# Patient Record
Sex: Female | Born: 1945
Health system: Southern US, Community
[De-identification: ages and names within clinical notes are randomized; demographics above are authoritative.]

## PROBLEM LIST (undated history)

## (undated) DIAGNOSIS — G4700 Insomnia, unspecified: Secondary | ICD-10-CM

## (undated) DIAGNOSIS — M549 Dorsalgia, unspecified: Secondary | ICD-10-CM

## (undated) DIAGNOSIS — N3941 Urge incontinence: Secondary | ICD-10-CM

## (undated) DIAGNOSIS — M2540 Effusion, unspecified joint: Secondary | ICD-10-CM

## (undated) DIAGNOSIS — R197 Diarrhea, unspecified: Secondary | ICD-10-CM

## (undated) DIAGNOSIS — IMO0002 Reserved for concepts with insufficient information to code with codable children: Secondary | ICD-10-CM

## (undated) DIAGNOSIS — N63 Unspecified lump in unspecified breast: Secondary | ICD-10-CM

## (undated) DIAGNOSIS — T4145XA Adverse effect of unspecified anesthetic, initial encounter: Secondary | ICD-10-CM

## (undated) DIAGNOSIS — J329 Chronic sinusitis, unspecified: Secondary | ICD-10-CM

## (undated) DIAGNOSIS — R0600 Dyspnea, unspecified: Secondary | ICD-10-CM

## (undated) DIAGNOSIS — R5383 Other fatigue: Secondary | ICD-10-CM

## (undated) DIAGNOSIS — E669 Obesity, unspecified: Secondary | ICD-10-CM

## (undated) DIAGNOSIS — J189 Pneumonia, unspecified organism: Secondary | ICD-10-CM

## (undated) DIAGNOSIS — R011 Cardiac murmur, unspecified: Secondary | ICD-10-CM

## (undated) DIAGNOSIS — T8859XA Other complications of anesthesia, initial encounter: Secondary | ICD-10-CM

## (undated) DIAGNOSIS — R51 Headache: Secondary | ICD-10-CM

## (undated) DIAGNOSIS — F329 Major depressive disorder, single episode, unspecified: Secondary | ICD-10-CM

## (undated) DIAGNOSIS — F32A Depression, unspecified: Secondary | ICD-10-CM

## (undated) DIAGNOSIS — R55 Syncope and collapse: Secondary | ICD-10-CM

## (undated) DIAGNOSIS — I700 Atherosclerosis of aorta: Secondary | ICD-10-CM

## (undated) DIAGNOSIS — E739 Lactose intolerance, unspecified: Secondary | ICD-10-CM

## (undated) DIAGNOSIS — K5792 Diverticulitis of intestine, part unspecified, without perforation or abscess without bleeding: Secondary | ICD-10-CM

## (undated) DIAGNOSIS — K5790 Diverticulosis of intestine, part unspecified, without perforation or abscess without bleeding: Secondary | ICD-10-CM

## (undated) DIAGNOSIS — R531 Weakness: Secondary | ICD-10-CM

## (undated) DIAGNOSIS — I10 Essential (primary) hypertension: Secondary | ICD-10-CM

## (undated) DIAGNOSIS — K259 Gastric ulcer, unspecified as acute or chronic, without hemorrhage or perforation: Secondary | ICD-10-CM

## (undated) DIAGNOSIS — Z8709 Personal history of other diseases of the respiratory system: Secondary | ICD-10-CM

## (undated) DIAGNOSIS — F419 Anxiety disorder, unspecified: Secondary | ICD-10-CM

## (undated) DIAGNOSIS — D649 Anemia, unspecified: Secondary | ICD-10-CM

## (undated) DIAGNOSIS — E213 Hyperparathyroidism, unspecified: Secondary | ICD-10-CM

## (undated) DIAGNOSIS — K760 Fatty (change of) liver, not elsewhere classified: Secondary | ICD-10-CM

## (undated) DIAGNOSIS — R200 Anesthesia of skin: Secondary | ICD-10-CM

## (undated) DIAGNOSIS — M2550 Pain in unspecified joint: Secondary | ICD-10-CM

## (undated) DIAGNOSIS — L709 Acne, unspecified: Secondary | ICD-10-CM

## (undated) DIAGNOSIS — M1990 Unspecified osteoarthritis, unspecified site: Secondary | ICD-10-CM

## (undated) DIAGNOSIS — F4310 Post-traumatic stress disorder, unspecified: Secondary | ICD-10-CM

## (undated) DIAGNOSIS — Z87898 Personal history of other specified conditions: Secondary | ICD-10-CM

## (undated) DIAGNOSIS — G8929 Other chronic pain: Secondary | ICD-10-CM

## (undated) DIAGNOSIS — E785 Hyperlipidemia, unspecified: Secondary | ICD-10-CM

## (undated) DIAGNOSIS — Z8669 Personal history of other diseases of the nervous system and sense organs: Secondary | ICD-10-CM

## (undated) DIAGNOSIS — R112 Nausea with vomiting, unspecified: Secondary | ICD-10-CM

## (undated) DIAGNOSIS — E2839 Other primary ovarian failure: Secondary | ICD-10-CM

## (undated) HISTORY — PX: CATARACT EXTRACTION, BILATERAL: SHX1313

## (undated) HISTORY — DX: Syncope and collapse: R55

## (undated) HISTORY — DX: Depression, unspecified: F32.A

## (undated) HISTORY — PX: DILATION AND CURETTAGE OF UTERUS: SHX78

## (undated) HISTORY — DX: Dorsalgia, unspecified: M54.9

## (undated) HISTORY — DX: Reserved for concepts with insufficient information to code with codable children: IMO0002

## (undated) HISTORY — DX: Unspecified osteoarthritis, unspecified site: M19.90

## (undated) HISTORY — DX: Anxiety disorder, unspecified: F41.9

## (undated) HISTORY — PX: CERVICAL CONE BIOPSY: SUR198

## (undated) HISTORY — DX: Weakness: R53.1

## (undated) HISTORY — PX: MOUTH SURGERY: SHX715

## (undated) HISTORY — DX: Dyspnea, unspecified: R06.00

## (undated) HISTORY — DX: Headache: R51

## (undated) HISTORY — DX: Other primary ovarian failure: E28.39

## (undated) HISTORY — DX: Nausea with vomiting, unspecified: R11.2

## (undated) HISTORY — PX: NASAL SEPTUM SURGERY: SHX37

## (undated) HISTORY — DX: Other fatigue: R53.83

## (undated) HISTORY — PX: TUBAL LIGATION: SHX77

## (undated) HISTORY — DX: Major depressive disorder, single episode, unspecified: F32.9

## (undated) HISTORY — DX: Other chronic pain: G89.29

---
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
## 2004-02-09 ENCOUNTER — Ambulatory Visit: Payer: Self-pay | Admitting: Family Medicine

## 2004-10-12 ENCOUNTER — Ambulatory Visit: Payer: Self-pay | Admitting: Family Medicine

## 2005-04-06 ENCOUNTER — Ambulatory Visit: Payer: Self-pay | Admitting: Family Medicine

## 2005-04-24 ENCOUNTER — Other Ambulatory Visit: Admission: RE | Admit: 2005-04-24 | Discharge: 2005-04-24 | Payer: Self-pay | Admitting: Obstetrics and Gynecology

## 2005-07-18 ENCOUNTER — Ambulatory Visit: Admission: RE | Admit: 2005-07-18 | Discharge: 2005-07-18 | Payer: Self-pay | Admitting: Gynecologic Oncology

## 2005-10-03 ENCOUNTER — Ambulatory Visit (HOSPITAL_COMMUNITY): Admission: RE | Admit: 2005-10-03 | Discharge: 2005-10-03 | Payer: Self-pay | Admitting: Gynecologic Oncology

## 2005-11-08 ENCOUNTER — Ambulatory Visit: Payer: Self-pay | Admitting: Family Medicine

## 2006-05-22 ENCOUNTER — Ambulatory Visit: Payer: Self-pay | Admitting: Family Medicine

## 2006-11-06 ENCOUNTER — Telehealth: Payer: Self-pay | Admitting: Family Medicine

## 2006-12-11 DIAGNOSIS — R51 Headache: Secondary | ICD-10-CM

## 2006-12-11 DIAGNOSIS — R519 Headache, unspecified: Secondary | ICD-10-CM | POA: Insufficient documentation

## 2006-12-12 ENCOUNTER — Ambulatory Visit: Payer: Self-pay | Admitting: Family Medicine

## 2006-12-31 ENCOUNTER — Telehealth: Payer: Self-pay | Admitting: Family Medicine

## 2007-02-05 ENCOUNTER — Telehealth: Payer: Self-pay | Admitting: Family Medicine

## 2007-02-26 ENCOUNTER — Encounter: Payer: Self-pay | Admitting: Family Medicine

## 2007-07-02 ENCOUNTER — Ambulatory Visit: Payer: Self-pay | Admitting: Family Medicine

## 2007-07-02 DIAGNOSIS — M62838 Other muscle spasm: Secondary | ICD-10-CM

## 2007-07-02 DIAGNOSIS — M1990 Unspecified osteoarthritis, unspecified site: Secondary | ICD-10-CM

## 2007-08-13 ENCOUNTER — Telehealth: Payer: Self-pay | Admitting: Family Medicine

## 2008-01-08 ENCOUNTER — Ambulatory Visit: Payer: Self-pay | Admitting: Family Medicine

## 2008-01-08 DIAGNOSIS — M545 Low back pain, unspecified: Secondary | ICD-10-CM | POA: Insufficient documentation

## 2008-07-08 ENCOUNTER — Telehealth: Payer: Self-pay | Admitting: Family Medicine

## 2008-07-29 ENCOUNTER — Ambulatory Visit: Payer: Self-pay | Admitting: Family Medicine

## 2009-02-10 ENCOUNTER — Ambulatory Visit: Payer: Self-pay | Admitting: Family Medicine

## 2009-02-10 DIAGNOSIS — F341 Dysthymic disorder: Secondary | ICD-10-CM | POA: Insufficient documentation

## 2009-02-10 DIAGNOSIS — IMO0002 Reserved for concepts with insufficient information to code with codable children: Secondary | ICD-10-CM

## 2009-02-10 DIAGNOSIS — M1710 Unilateral primary osteoarthritis, unspecified knee: Secondary | ICD-10-CM | POA: Insufficient documentation

## 2009-02-10 HISTORY — DX: Unilateral primary osteoarthritis, unspecified knee: M17.10

## 2009-08-25 ENCOUNTER — Ambulatory Visit: Payer: Self-pay | Admitting: Family Medicine

## 2010-03-03 ENCOUNTER — Ambulatory Visit
Admission: RE | Admit: 2010-03-03 | Discharge: 2010-03-03 | Payer: Self-pay | Source: Home / Self Care | Attending: Family Medicine | Admitting: Family Medicine

## 2010-03-03 DIAGNOSIS — E669 Obesity, unspecified: Secondary | ICD-10-CM | POA: Insufficient documentation

## 2010-03-22 NOTE — Assessment & Plan Note (Signed)
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 Past history of degenerative disc disease the lumbar spine Request an injection of Depo-Medrol 120 mg which helped her for approximately 3 months Requests refill of her  Allergies (verified): No Known Drug Allergies  Past History:  Past Medical History: Last updated: 02/10/2009 panic attacks Headache chronic low back pain  Review of Systems      See HPI  The patient denies anorexia, fever, weight loss, weight gain, vision loss, decreased hearing, hoarseness, chest pain, syncope, dyspnea on exertion, peripheral edema, prolonged cough, headaches, hemoptysis, abdominal pain, melena, hematochezia, severe indigestion/heartburn, hematuria, incontinence, genital sores, muscle weakness, suspicious skin lesions, transient blindness, difficulty walking, depression, unusual weight change, abnormal bleeding, enlarged lymph nodes, angioedema, breast masses, and testicular masses.    Physical Exam  General:  Well-developed,well-nourished,in no acute distress; alert,appropriate and cooperative throughout examinationoverweight-appearing.   Neck:  No deformities, masses, or tenderness noted. Lungs:  Normal respiratory effort, chest expands symmetrically. Lungs are clear to auscultation, no crackles or wheezes. Heart:  Normal rate and regular rhythm. S1 and S2 normal without gallop,  murmur, click, rub or other extra sounds. Msk:  tenderness over her left knee medial and lateral aspect Tender overall lumbar spine as well as both sacroiliac joint   Impression & Recommendations:  Problem # 1:  ARTHRITIS, KNEE (ICD-716.96) Assessment Deteriorated  Orders: Depo- Medrol 80mg  (J1040) Depo- Medrol 40mg  (J1030) Admin of Therapeutic Inj  intramuscular or subcutaneous ([address])  Problem # 2:  ANXIETY DEPRESSION (ICD-300.4) Assessment: Unchanged  Problem # 3:  LOW BACK PAIN, CHRONIC (ICD-724.2) Assessment: Unchanged  The following medications were removed from the medication list:    Lorcet 10/650 10-650 Mg Tabs (Hydrocodone-acetaminophen) .Marland Kitchen... 1 q4h as needed pain not over 4 per day Her updated medication list for this problem includes:    Flexeril 10 Mg Tabs (Cyclobenzaprine hcl) .Marland Kitchen... 1 morn, midafternoon and hs for    Hydrocodone-acetaminophen 10-500 Mg Tabs (Hydrocodone-acetaminophen) .Marland Kitchen... 1 q4h as needed pain, not over 4 per day  Problem # 4:  HEADACHE (ICD-784.0) Assessment: Unchanged  The following medications were removed from the medication list:    Lorcet 10/650 10-650 Mg Tabs (Hydrocodone-acetaminophen) .Marland Kitchen... 1 q4h as needed pain not over 4 per day Her updated medication list for this problem includes:    Hydrocodone-acetaminophen 10-500 Mg Tabs (Hydrocodone-acetaminophen) .Marland Kitchen... 1 q4h as needed pain, not over 4 per day  Complete Medication List: 1)  Alprazolam 0.5 Mg Tabs (Alprazolam) .Marland Kitchen.. 1 three times a day for stress no early reflls 2)  Flexeril 10 Mg Tabs (Cyclobenzaprine hcl) .Marland Kitchen.. 1 morn, midafternoon and hs for 3)  Hydrocodone-acetaminophen 10-500 Mg Tabs (Hydrocodone-acetaminophen) .Marland Kitchen.. 1 q4h as needed pain, not over 4 per day  Patient Instructions: 1)  Will give Depo-Medrol 120  mg IMarthritis and chronic back pain 2)  We'll refill her hydrocodone and alprazolam 3)  Turn in 6 months for laboratory studies Prescriptions: ALPRAZOLAM 0.5 MG  TABS  (ALPRAZOLAM) 1 three times a day for stress NO EARLY REFLLS  #90 x 5   Entered and Authorized by:   Judithann Sheen MD   Signed by:   Judithann Sheen MD on 08/25/2009   Method used:   Print then Give to Patient   RxID:   [id number] HYDROCODONE-ACETAMINOPHEN 10-500 MG TABS (HYDROCODONE-ACETAMINOPHEN) 1 q4h as needed pain, not over 4 per day  #120 x 5   Entered and Authorized by:   Judithann Sheen MD   Signed by:   Judithann Sheen MD on 08/25/2009   Method used:   Print then Give to Patient   RxID:   [id number] FLEXERIL 10 MG TABS (CYCLOBENZAPRINE HCL) 1 morn, midafternoon and hs for  #90 x 11   Entered and Authorized by:   Judithann Sheen MD   Signed by:   Judithann Sheen MD on 08/25/2009   Method used:   Electronically to        [facility]. [phone]* (retail)       [phone] [address]       [place], [place]  [address]       Ph: [phone]       Fax: [phone]   RxID:   [id number]    Medication Administration  Injection # 1:    Medication: Depo- Medrol 80mg     Diagnosis: ARTHRITIS, KNEE [phone])    Route: IM    Site: RUOQ gluteus    Exp Date: 05/2012    Lot #: [id number]    Mfr: Pharmacia    Patient tolerated injection without complications    Given by: Pura Spice, RN (August 25, 2009 2:29 PM)  Injection # 2:    Medication: Depo- Medrol 40mg     Diagnosis: ARTHRITIS, KNEE (ICD-716.96)    Route: IM    Site: RUOQ gluteus    Exp Date: 05/2012    Lot #: [id number]    Mfr: Pharmacia    Patient tolerated injection without complications    Given by: Pura Spice, RN (August 25, 2009 2:30 PM)  Orders Added: 1)  Depo- Medrol 80mg  [J1040] 2)  Depo- Medrol 40mg  [J1030] 3)  Admin of Therapeutic Inj  intramuscular or subcutaneous [96372] 4)  Est. Patient Level III [42706]

## 2010-03-24 NOTE — Assessment & Plan Note (Signed)
Summary: MED CK / REVIEW // RS   Vital Signs:  Patient profile:   65 year old female Height:      67.5 inches Weight:      218 pounds BMI:     33.76 Temp:     98.4 degrees F oral Pulse rate:   99 / minute Pulse rhythm:   regular BP sitting:   150 / 84  (left arm) Cuff size:   regular  Vitals Entered By: Alfred Levins, CMA (March 03, 2010 1:31 PM)  HerCC: discuss meds   History of Present Illness: this 65 year old white single female who's had chronic low back pain as well as severe arthritis left knee, anxiety depression and episodic headaches these are all chronic problems and the patient is in the discussed these today as well as get refill of her medication she is out of work and unable to get a job at this time She has no other complaint except continues to be obese and having difficulty losing weight but is now normal a rigid called a noninflammatory.  Current Medications (verified): 1)  Alprazolam 0.5 Mg  Tabs (Alprazolam) .Marland Kitchen.. 1 Three Times A Day For Stress No Early Reflls 2)  Flexeril 10 Mg Tabs (Cyclobenzaprine Hcl) .Marland Kitchen.. 1 Morn, Midafternoon and Hs For 3)  Hydrocodone-Acetaminophen 10-500 Mg Tabs (Hydrocodone-Acetaminophen) .Marland Kitchen.. 1 Q4h As Needed Pain, Not Over 4 Per Day  Allergies (verified): No Known Drug Allergies  Past History:  Past Medical History: Last updated: 02/10/2009 panic attacks Headache chronic low back pain  Past Surgical History: Dand C age 64 tubal ligation conization of cervix  Review of Systems      See HPI  Physical Exam  General:  Well-developed,well-nourished,in no acute distress; alert,appropriate and cooperative throughout examinationoverweight-appearing.   Lungs:  Normal respiratory effort, chest expands symmetrically. Lungs are clear to auscultation, no crackles or wheezes. Heart:  Normal rate and regular rhythm. S1 and S2 normal without gallop, murmur, click, rub or other extra sounds. Abdomen:  Bowel sounds positive,abdomen  soft and non-tender without masses, organomegaly or hernias noted. Msk:  tenderness over the lumbosacral spine both SI joints bilaterally Swollen tender left knee   Impression & Recommendations:  Problem # 1:  LOW BACK PAIN, CHRONIC (ICD-724.2) Assessment Deteriorated  The following medications were removed from the medication list:    Hydrocodone-acetaminophen 10-500 Mg Tabs (Hydrocodone-acetaminophen) .Marland Kitchen... 1 q4h as needed pain, not over 4 per day Her updated medication list for this problem includes:    Flexeril 10 Mg Tabs (Cyclobenzaprine hcl) .Marland Kitchen... 1 morn, midafternoon and hs for muscle spasm    Hydrocodone-acetaminophen 10-660 Mg Tabs (Hydrocodone-acetaminophen) .Marland Kitchen... 1 q4h as needed pain  Orders: Admin of Therapeutic Inj  intramuscular or subcutaneous (16109) Depo- Medrol 80mg  (J1040) Admin of Therapeutic Inj  intramuscular or subcutaneous ([address])  Problem # 2:  EXOGENOUS OBESITY (ICD-278.00) Assessment: Unchanged  Problem # 3:  ARTHRITIS, KNEE (ICD-716.96) Assessment: Deteriorated recommend seeing the orthopedist  Problem # 4:  ANXIETY DEPRESSION (ICD-300.4) Assessment: Unchanged  Problem # 5:  ARTHRITIS (ICD-716.90) Assessment: Unchanged  Complete Medication List: 1)  Alprazolam 0.5 Mg Tabs (Alprazolam) .Marland Kitchen.. 1 three times a day for stress no early reflls 2)  Flexeril 10 Mg Tabs (Cyclobenzaprine hcl) .Marland Kitchen.. 1 morn, midafternoon and hs for muscle spasm 3)  Hydrocodone-acetaminophen 10-660 Mg Tabs (Hydrocodone-acetaminophen) .Marland Kitchen.. 1 q4h as needed pain  Patient Instructions: 1)  decrease salt in diet 2)  after getting the injection you will be better to exercise 3)  You  need to lose weight. Consider a lower calorie diet and regular exercise.  Prescriptions: FLEXERIL 10 MG TABS (CYCLOBENZAPRINE HCL) 1 morn, midafternoon and hs for muscle spasm  #90 x 11   Entered and Authorized by:   Judithann Sheen MD   Signed by:   Judithann Sheen MD on 03/03/2010   Method  used:   Electronically to        [facility] [address] [phone]* (retail)       [address]       [place], [place]  [address]       Ph: [phone] or [phone]       Fax: [phone]   RxID:   [id number] HYDROCODONE-ACETAMINOPHEN 10-660 MG TABS (HYDROCODONE-ACETAMINOPHEN) 1 q4h as needed pain  #100 x 5   Entered and Authorized by:   Judithann Sheen MD   Signed by:   Judithann Sheen MD on 03/03/2010   Method used:   Print then Give to Patient   RxID:   [id number] ALPRAZOLAM 0.5 MG  TABS (ALPRAZOLAM) 1 three times a day for stress NO EARLY REFLLS  #90 x 5   Entered and Authorized by:   Judithann Sheen MD   Signed by:   Judithann Sheen MD on 03/03/2010   Method used:   Print then Give to Patient   RxID:   [id number]    Medication Administration  Injection # 1:    Medication: Depo- Medrol 80mg     Diagnosis: LOW BACK PAIN, CHRONIC (ICD-724.2)    Route: IM    Site: RUOQ gluteus    Exp Date: 09/08/2012    Lot #: [id number]    Mfr: Pharmacia    Patient tolerated injection without complications    Given by: Judithann Sheen MD (March 09, 2010 12:44 PM)  Orders Added: 1)  Admin of Therapeutic Inj  intramuscular or subcutaneous [96372] 2)  Depo- Medrol 80mg  [J1040] 3)  Admin of Therapeutic Inj  intramuscular or subcutaneous [96372]  Appended Document: MED CK / REVIEW // RS

## 2010-07-08 NOTE — Consult Note (Signed)
NAME:  Alexis Cole, TOPOR NO.:  [id number]   MEDICAL RECORD NO.:  [id number]          PATIENT TYPE:  OUT   LOCATION:  GYN                          FACILITY:  [facility]   PHYSICIAN:  John T. Kyla Balzarine, M.D.    DATE OF BIRTH:  1945/04/17   DATE OF CONSULTATION:  DATE OF DISCHARGE:                                   CONSULTATION   CHIEF COMPLAINT:  Followup after laser vaporization of multiple vulvar  condylomas/carcinoma in situ.   This patient underwent examination under anesthesia with laser vaporization  of multiple genital condylomas and VIN III of the vulva, performed October 03, 2005.  She had an uncomplicated convalescence and rapidly healed.  She  notes no new condylomatous lesions.   PHYSICAL EXAMINATION:  Inspection of the vulva reveals no acetowhite  lesions.  There are patches of new skin which represent larger plaques of  disease that were treated with the laser.  Bimanual and rectovaginal  examinations are deferred.   ASSESSMENT:  Multifocal vulvar dysplasia/carcinoma in situ treated with  laser.   PLAN:  The patient reassured, and I recommended that she have examination  and cytology at 6 month intervals.  This could be performed by Dr. Renaldo Fiddler,  and we would be glad to see the patient back at any time on a p.r.n. basis.      John T. Kyla Balzarine, M.D.  Electronically Signed     JTS/MEDQ  D:  11/28/2005  T:  11/29/2005  Job:  [id number]   cc:   Telford Nab, R.N.  [address]  [place], [place] [address]   Zelphia Cairo, MD  Fax: [phone]

## 2010-07-08 NOTE — Consult Note (Signed)
NAME:  Alexis Cole, Alexis NO.:  [id number]   MEDICAL RECORD NO.:  [id number]          PATIENT TYPE:  OUT   LOCATION:  GYN                          FACILITY:  [facility]   PHYSICIAN:  John T. Kyla Balzarine, M.D.    DATE OF BIRTH:  05/11/1945   DATE OF CONSULTATION:  07/18/2005  DATE OF DISCHARGE:                                   CONSULTATION   CHIEF COMPLAINT:  This 65 year old woman is seen at the request of Dr.  Zelphia Cairo for management of high-grade vulvar dysplasia.   HISTORY OF PRESENT ILLNESS:  The patient notes that she has had vulvar  lesions for almost 20 years.  Apparently, at about that time, she was  treated for CIN with conization of the cervix.  She relates normal cytology  since and had a Pap smear on April 24, 2005 negative for intraepithelial  lesions.  She was treated for Trichomonas with Flagyl.  At the time she was  evaluated by Dr. Renaldo Fiddler, she was found to have two condylomatous appearing  lesions which were removed in the office revealing VIN II to III.  She is  aware of persistent condylomatous tissue across the perineum.   PAST MEDICAL HISTORY:  Significant for migraine headaches, undefined thyroid  problem in the past but on no replacement.  She has chronic back issues  related to an accident.  Status post tubal ligation, conization.   MEDICATIONS:  Xanax, Celebrex, cortisone injections every eight months to  one year.   ALLERGIES:  None known.   PERSONAL/SOCIAL HISTORY:  The patient is a Barista and admits  tobacco use; denies significant ethanol use.   FAMILY HISTORY:  Significant for lung cancer in her father and hypertension  in her mother but no known breast, gynecologic or colon malignancies.   REVIEW OF SYSTEMS:  Otherwise negative.   EXAM:  Weight 146 pounds, blood pressure 192/64, pulse 82.  The patient is  alert and oriented x3 in no acute distress.  There is no pathologic  lymphadenopathy on lymph node survey.  The  abdomen is soft and benign with  no tenderness or mass.  Extremities have full strength and range of motion  without edema.  External genitalia and BUS are normal to inspection and  palpation with the exception of a 3.1 cm patch condylomatous epithelium  across the left mid-perineum, not biopsied today.  Vagina is clear, without  lesions.  Cervix mobile without lesions.  Bimanual and rectovaginal  examinations is unremarkable.   ASSESSMENT:  VIN II to III in a patient with other sites of lower genital  tract dysplasia.   PLAN:  We discussed options for management including Aldara, excision and  laser vaporazation.  At the end of our discussion the patient opted for  laser vaporazation.  She has some dance events in June, therefore we will  schedule her at my next visit, July, 24th.      John T. Kyla Balzarine, M.D.  Electronically Signed     JTS/MEDQ  D:  07/18/2005  T:  07/19/2005  Job:  [id number]   cc:  Zelphia Cairo, MD  Fax: [phone]   Telford Nab, R.N.  [address]  [place], [place] [address]

## 2010-07-08 NOTE — Letter (Signed)
February 26, 2007    Trial Court Administrator  ATTN:  Lonn Georgia Request  [address]  [place], [place], [address]   RE:  JOIA, DOYLE  MRN:  [id number]  /  DOB:  1946/01/04   Dear Milford Cage:   This is Dr. Leroy Sea and I am the doctor for Fransico Him.  Address [address] [place], [place] [address].  Date  of birth 10/31/1945.  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  Please excuse her  from this duty.   Her problems relate to recurrent panic attacks.  She has a recent knee  injury and having considerable problems.  In addition to this, she has  difficulty sitting for any extended time due to a previous fracture to  her back.  She is under the care of a neurosurgeon.   If any other information is desired from me, please do not hesitate to  notify me.  Thank you very much for your consideration in regard to  excusing this person from jury duty.    Sincerely,      Ellin Saba., MD  Electronically Signed    WRS/MedQ  DD: 02/26/2007  DT: 02/26/2007  Job #: [id number]

## 2010-07-08 NOTE — Op Note (Signed)
NAME:  Alexis Cole, Alexis Cole NO.:  [id number]   MEDICAL RECORD NO.:  [id number]          PATIENT TYPE:  AMB   LOCATION:  DAY                          FACILITY:  [facility]   PHYSICIAN:  John T. Kyla Balzarine, M.D.    DATE OF BIRTH:  [birth year]   DATE OF PROCEDURE:  10/03/2005  DATE OF DISCHARGE:                                 OPERATIVE REPORT   SURGEON:  John T. Kyla Balzarine, M.D.   PREOPERATIVE DIAGNOSIS:  Multiple vulvar condylomas/carcinoma in situ.   POSTOPERATIVE DIAGNOSIS:  Multiple vulvar condylomas/carcinoma in situ.   PROCEDURE:  Extensive laser vaporization of vulvar lesions.   ANESTHESIA:  General LMA with local Marcaine.   DESCRIPTION OF FINDINGS AND INDICATIONS FOR SURGERY:  This 65 year old woman  presented with multiple condylomatous lesions involving the perineum,  anterior left labia majora and left posterior labia majora, clinically  compatible with condyloma, but biopsy revealing VIN2 to VIN3.  She consented  to laser vaporization as an alternative to other forms of therapy.   Examination under anesthesia revealed a 2 by 3 cm flap condylomatous lesion  in the central perineum, superficial lesion measuring 1 by 2 cm left  posterior labial majora, and a 0.7 by 0.7 cm condylomatous lesion in the  anterior left labia majora.  Inspection with acetic acid revealed no other  lesions.  All lesions were ablated to the second surgical plane using the  CO2 laser.   DESCRIPTION OF PROCEDURE:  The patient was prepped and draped in the  lithotomy position with wet drapes.  The vulva was stained with prolonged  contact with acetic acid and revealed the findings as described above.  The  base of each individual lesion was injected intradermally and subcutaneously  with 0.25% Marcaine, 10 mL total.  Using the CO2 laser with hand piece, the  individual lesions were ablated to the second surgical plane.  Adjacent  normal appearing skin was taken down to a similar depth at least 5  mm  circumference around each lesion.  Hemostasis was excellent and the patient  was returned to the recovery room in stable condition.  Estimated blood loss  nil.  Sponge and instrument counts were correct.  Pathology specimen none.      John T. Kyla Balzarine, M.D.  Electronically Signed     JTS/MEDQ  D:  10/03/2005  T:  10/03/2005  Job:  [id number]   cc:   Telford Nab, R.N.  [address]  [place], [place] [address]   Zelphia Cairo, MD  Fax: [phone]

## 2010-08-23 ENCOUNTER — Encounter: Payer: Self-pay | Admitting: Family Medicine

## 2010-08-23 ENCOUNTER — Ambulatory Visit (INDEPENDENT_AMBULATORY_CARE_PROVIDER_SITE_OTHER): Payer: Self-pay | Admitting: Family Medicine

## 2010-08-23 VITALS — BP 126/90 | Temp 98.4°F | Wt 216.0 lb

## 2010-08-23 DIAGNOSIS — M545 Low back pain: Secondary | ICD-10-CM

## 2010-08-23 DIAGNOSIS — M129 Arthropathy, unspecified: Secondary | ICD-10-CM

## 2010-08-23 DIAGNOSIS — M1990 Unspecified osteoarthritis, unspecified site: Secondary | ICD-10-CM

## 2010-08-23 MED ORDER — METHYLPREDNISOLONE ACETATE 80 MG/ML IJ SUSP
120.0000 mg | Freq: Once | INTRAMUSCULAR | Status: AC
  Administered 2010-08-23: 120 mg via INTRAMUSCULAR

## 2010-08-23 MED ORDER — CYCLOBENZAPRINE HCL 10 MG PO TABS: 10.0000 mg | ORAL_TABLET | Freq: Three times a day (TID) | ORAL | Status: DC | PRN

## 2010-08-23 MED ORDER — ALPRAZOLAM 0.5 MG PO TABS: ORAL_TABLET | ORAL | Status: DC

## 2010-08-23 MED ORDER — HYDROCODONE-ACETAMINOPHEN 10-500 MG PO TABS: ORAL_TABLET | ORAL | Status: DC

## 2010-09-26 ENCOUNTER — Encounter: Payer: Self-pay | Admitting: Family Medicine

## 2010-09-26 NOTE — Patient Instructions (Signed)
For your chronic pain anxiety and depression we will continue the same medication

## 2010-09-26 NOTE — Progress Notes (Signed)
  Subjective:    Patient ID: Alexis Cole, female    DOB: 21-Nov-1945, 65 y.o.   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 She cannot take NSAIDs even Celebrex caused nausea and vomiting so we must rely on pain medications for arthritis she relates she has been in pain for the past 6 months she continues not to have any and sure what her sugars normal has no other symptoms except those have muscle spasm over the lumbar spine HPI    Review of Systems CHPI     Objective:   Physical Exam patient is a well-built well nourished obese white female who is in no distress but is complaining of pain vital signs good except for weight Heart lungs no abnormalities noted Back lumbar muscle spasm tenderness over both SI joints limitation straight leg raising Extremities no edema , both knees mildly swollen with tenderness medially and laterally bilaterally        Assessment & Plan:  Rondec low back pain continue hydrocodone Also spasm Flexeril 10 mg 3 times a day Chronic anxiety and depression continue alprazolam  0.5 mg 3 times a day

## 2010-11-15 ENCOUNTER — Telehealth: Payer: Self-pay | Admitting: Family Medicine

## 2010-11-15 NOTE — Telephone Encounter (Signed)
Pt states she was approved for a shot every 3 months and she comes to see Dr. Scotty Court ever 6 months.

## 2010-11-15 NOTE — Telephone Encounter (Signed)
Pt  Is having issues with her back, would not give details. Pt is requesting a call back

## 2010-11-15 NOTE — Telephone Encounter (Signed)
Per pt request going to check on the amount of a depo medrol injection.  Pt is aware that she will receive a return call tomorrow 11/16/10.

## 2010-11-16 NOTE — Telephone Encounter (Signed)
Called and spoke with pt and she is aware that an estimate for the depo medrol 120 mg is about $92.25. Pt states she will bring $122 just in case.  Pt is aware that this is an estimate and the price may vary.

## 2010-11-23 ENCOUNTER — Ambulatory Visit: Payer: Self-pay | Admitting: Family Medicine

## 2010-11-23 ENCOUNTER — Ambulatory Visit (INDEPENDENT_AMBULATORY_CARE_PROVIDER_SITE_OTHER): Payer: Self-pay

## 2010-11-23 DIAGNOSIS — M1990 Unspecified osteoarthritis, unspecified site: Secondary | ICD-10-CM

## 2010-11-23 DIAGNOSIS — M129 Arthropathy, unspecified: Secondary | ICD-10-CM

## 2010-11-23 DIAGNOSIS — G8929 Other chronic pain: Secondary | ICD-10-CM

## 2010-11-23 DIAGNOSIS — M62838 Other muscle spasm: Secondary | ICD-10-CM

## 2010-11-23 DIAGNOSIS — M545 Low back pain: Secondary | ICD-10-CM

## 2010-11-23 MED ORDER — METHYLPREDNISOLONE ACETATE 80 MG/ML IJ SUSP
120.0000 mg | Freq: Once | INTRAMUSCULAR | Status: AC
  Administered 2010-11-23: 120 mg via INTRAMUSCULAR

## 2011-03-09 ENCOUNTER — Ambulatory Visit (INDEPENDENT_AMBULATORY_CARE_PROVIDER_SITE_OTHER): Payer: Self-pay | Admitting: Family Medicine

## 2011-03-09 VITALS — BP 155/95 | HR 80 | Temp 98.0°F | Ht 67.5 in | Wt 211.7 lb

## 2011-03-09 DIAGNOSIS — Z8739 Personal history of other diseases of the musculoskeletal system and connective tissue: Secondary | ICD-10-CM

## 2011-03-09 DIAGNOSIS — I10 Essential (primary) hypertension: Secondary | ICD-10-CM

## 2011-03-09 NOTE — Patient Instructions (Addendum)
Please make sure we have received your record from your prior doctor. Please have labs taken and after that make an appointment for a full physical.  Take BP regularly an keep a diary with values. Make an appointment once your prior medical records are available to [place].

## 2011-03-14 ENCOUNTER — Encounter: Payer: Self-pay | Admitting: Family Medicine

## 2011-03-14 DIAGNOSIS — I10 Essential (primary) hypertension: Secondary | ICD-10-CM | POA: Insufficient documentation

## 2011-03-14 DIAGNOSIS — Z8739 Personal history of other diseases of the musculoskeletal system and connective tissue: Secondary | ICD-10-CM | POA: Insufficient documentation

## 2011-03-14 NOTE — Assessment & Plan Note (Signed)
On visit 155/95. At the end of the visit. Manually 140/19mmHg. No hx of hypertension she knows of.  Plan: Release of information form to obtain prior Pt medical history. Recheck BP every other day and keep a BP diary. CBC, CMET, Lipid Profile, A1C

## 2011-03-14 NOTE — Progress Notes (Signed)
  Subjective:    Patient ID: Alexis Cole, female    DOB: [birth year], 66 y.o.   MRN: [id number]  HPI Pt that comes today to establish care in our [facility]. She was seen in a office who's doctor retire recently and for financial reasons was not be able to access care until now. Her main concern is her chronic pain management. She came with a med container of narcotics asking for refills, as well as Xanax that she says she takes for her panic attacks. She comes accompanied by a friend who presents herself as a Publishing rights manager and per pt's own statement:  "she is here to help, since I am afraid of doctors and all I need is an MD to refill my medications". She comments she has a long standing history of back pain but never had studies done and she is waiting to be covered with insurance in 6 months to be able to get an MRI. Pt denies other medical problems or other medications she takes on regular bases. Afebrile. No chest pain, no palpitations, no SOB. No recent changes in her urinary or bowel habits. We don't have the New Patient Health History form is suppose to be filled by the pt before she gets establish in our clinic and most of encounter was discussing how her narcotic and benzodiazepine prescriptions are going to be addressed.  Review of Systems  Constitutional: Negative for fever, chills, diaphoresis, activity change, appetite change, fatigue and unexpected weight change.  HENT: Negative for neck pain.   Eyes: Negative.   Respiratory: Negative.   Cardiovascular: Negative.   Gastrointestinal: Negative.   Genitourinary: Negative.   Musculoskeletal: Positive for back pain, arthralgias and gait problem.  Neurological: Negative for dizziness, tremors, syncope, facial asymmetry and weakness.       Objective:   Physical Exam  Constitutional: She is oriented to person, place, and time. She appears distressed.       Disheveled and moderated distressed due to concern for pain medication refills.    HENT:  Mouth/Throat: Oropharynx is clear and moist.  Eyes: Conjunctivae are normal.  Neck: Neck supple.  Cardiovascular: Normal rate, regular rhythm and normal heart sounds.   No murmur heard. Pulmonary/Chest: Effort normal and breath sounds normal. No respiratory distress. She has no wheezes. She has no rales.  Abdominal: Bowel sounds are normal.  Musculoskeletal: She exhibits no edema.       Pain on palpation of lower back. Antalgic gait. Straight leg non explored since pt is on pain to minimal manipulation.   Neurological: She is alert and oriented to person, place, and time.       No neurologic focalization          Assessment & Plan:

## 2011-03-14 NOTE — Assessment & Plan Note (Signed)
No prior records.Chronic back pain and antalgic gait. No diagnosis established that pt knows of. Difficult to examined due to pt reported pain even though she is on narcotic pain medication. She wants to have MRI when her insurance is approved in 6 months. Plan: Release of information form in order to know her prior history and reason for narcotic/ benzodiazepine treatment. I am not prescribing Narcotic/Benzo treatment until I get more information about prior hx of this pt. Look pt on narcotic database. UDS on every visit.

## 2011-03-21 ENCOUNTER — Encounter: Payer: Self-pay | Admitting: Family Medicine

## 2011-03-23 ENCOUNTER — Telehealth: Payer: Self-pay | Admitting: Family Medicine

## 2011-03-23 NOTE — Telephone Encounter (Signed)
Wants to know if Dr. [name] got her records from [place].

## 2011-03-24 ENCOUNTER — Encounter (HOSPITAL_BASED_OUTPATIENT_CLINIC_OR_DEPARTMENT_OTHER): Payer: Self-pay | Admitting: *Deleted

## 2011-03-24 ENCOUNTER — Emergency Department (HOSPITAL_BASED_OUTPATIENT_CLINIC_OR_DEPARTMENT_OTHER)
Admission: EM | Admit: 2011-03-24 | Discharge: 2011-03-24 | Disposition: A | Discharge status: Home / Self Care | Payer: Self-pay | Attending: Emergency Medicine | Admitting: Emergency Medicine

## 2011-03-24 DIAGNOSIS — Z8739 Personal history of other diseases of the musculoskeletal system and connective tissue: Secondary | ICD-10-CM | POA: Insufficient documentation

## 2011-03-24 DIAGNOSIS — M25569 Pain in unspecified knee: Secondary | ICD-10-CM | POA: Insufficient documentation

## 2011-03-24 DIAGNOSIS — Z79899 Other long term (current) drug therapy: Secondary | ICD-10-CM | POA: Insufficient documentation

## 2011-03-24 DIAGNOSIS — F341 Dysthymic disorder: Secondary | ICD-10-CM | POA: Insufficient documentation

## 2011-03-24 DIAGNOSIS — G8929 Other chronic pain: Secondary | ICD-10-CM | POA: Insufficient documentation

## 2011-03-24 MED ORDER — ALPRAZOLAM 0.5 MG PO TABS: 0.5000 mg | ORAL_TABLET | Freq: Every evening | ORAL | Status: DC | PRN

## 2011-03-24 MED ORDER — HYDROCODONE-ACETAMINOPHEN 5-500 MG PO TABS: 1.0000 | ORAL_TABLET | Freq: Four times a day (QID) | ORAL | Status: DC | PRN

## 2011-03-24 NOTE — Telephone Encounter (Signed)
Forwarded to pcp.Wyman Meschke Lynetta  

## 2011-03-24 NOTE — ED Provider Notes (Signed)
History    66 year old female with right lower extremity pain. Is chronic in nature. Patient denies acute trauma. Pain is primarily in the right knee.   Feels relatively okay at rest but increased pain with movement and ambulation . Patient has been on narcotic pain medication for several years for chronic lower back pain and also extremity pain. No fever or chills. No swelling. Denies history of blood clot. Sometimes notices that her right shin appears red. Denies history of diabetes. CSN: [id number]  Arrival date & time 03/24/11  1721   First MD Initiated Contact with Patient 03/24/11 1742      Chief Complaint  Patient presents with  . Leg Pain    (Consider location/radiation/quality/duration/timing/severity/associated sxs/prior treatment) HPI  Past Medical History  Diagnosis Date  . Anxiety   . Headache   . Chronic back pain   . Arthritis   . Depression     Past Surgical History  Procedure Date  . Tubal ligation   . Cervical cone biopsy     History reviewed. No pertinent family history.  History  Substance Use Topics  . Smoking status: Former Smoker -- 1.0 packs/day    Types: Cigarettes  . Smokeless tobacco: Not on file  . Alcohol Use: Yes     occ    OB History    Grav Para Term Preterm Abortions TAB SAB Ect Mult Living                  Review of Systems   Review of symptoms negative unless otherwise noted in HPI.  Allergies  Review of patient's allergies indicates no known allergies.  Home Medications   Current Outpatient Rx  Name Route Sig Dispense Refill  . ALPRAZOLAM 0.5 MG PO TABS Oral Take 0.25-0.5 mg by mouth 3 (three) times daily as needed. for anxiety and stress    . CYCLOBENZAPRINE HCL 10 MG PO TABS Oral Take 1 tablet (10 mg total) by mouth 3 (three) times daily as needed. For muscle spasm 90 tablet 11  . HYDROCODONE-ACETAMINOPHEN 10-500 MG PO TABS Oral Take 1 tablet by mouth every 6 (six) hours as needed. For pain    . ALPRAZOLAM 0.5 MG PO  TABS Oral Take 1 tablet (0.5 mg total) by mouth at bedtime as needed for sleep. 15 tablet 0  . HYDROCODONE-ACETAMINOPHEN 5-500 MG PO TABS Oral Take 1-2 tablets by mouth every 6 (six) hours as needed for pain. 15 tablet 0    BP 141/85  Pulse 103  Temp(Src) 98.1 F (36.7 C) (Oral)  Resp 24  Ht 5\' 7"  (1.702 m)  Wt 203 lb (92.08 kg)  BMI 31.79 kg/m2  SpO2 98%  Physical Exam  Nursing note and vitals reviewed. Constitutional: She appears well-developed and well-nourished. No distress.       Sitting up in bed. No acute distress. Obese  HENT:  Head: Normocephalic and atraumatic.  Eyes: Conjunctivae are normal. Right eye exhibits no discharge. Left eye exhibits no discharge.  Neck: Neck supple.  Cardiovascular: Normal rate, regular rhythm and normal heart sounds.  Exam reveals no gallop and no friction rub.   No murmur heard. Pulmonary/Chest: Effort normal and breath sounds normal. No respiratory distress.  Musculoskeletal: She exhibits no edema and no tenderness.       Right r grossly normal in appearance and symmetric as compared to the left. Right knee without appreciable effusion. Mild diffuse tenderness. Full range of motion. There is no significant increased pain with range of motion.  No overlying skin changes. No erythema appreciated. No ligamentous laxity. No calf tenderness. Calves are symmetric. Good DP pulses bilaterally. Good muscle tone.  Neurological: She is alert.  Skin: Skin is warm and dry.  Psychiatric: She has a normal mood and affect. Her behavior is normal. Thought content normal.    ED Course  Procedures (including critical care time)  Labs Reviewed - No data to display No results found.   1. Knee pain       MDM  66 year old female with right leg pain. This is chronic in nature. No acute trauma. No ligamentous laxity appreciated on exam. Patient observed and daily without difficulty. Doubt septic joint. Doubt deep vein thrombosis. Suspect that imaging would  be of little utility at this time. Patient with chronic long-term benzodiazepine and narcotic use. Says recently lost insurance and has no way to followup for refills. Recent referral to pain management. Will give short course Xanax and Vicodin. Again stressed the need for patient to follow with pain management and be advocate for self.      Raeford Razor, MD 03/24/11 [phone]

## 2011-03-24 NOTE — ED Notes (Signed)
Pt having right leg pain right ankle redness and redness going up leg

## 2011-03-24 NOTE — Telephone Encounter (Signed)
Patient has called back again to see if Dr. [name] has gotten her records from [place]. She would also like her to know that the records may be under [facility].  She would like Dr. [name] to call her.

## 2011-03-27 ENCOUNTER — Telehealth: Payer: Self-pay | Admitting: Family Medicine

## 2011-03-27 NOTE — Telephone Encounter (Signed)
Can she go an make an appointment with you. I will check on records

## 2011-03-27 NOTE — Telephone Encounter (Signed)
Patient has called back again and is patiently and hopefully waiting for a call soon.

## 2011-03-27 NOTE — Telephone Encounter (Signed)
Alexis Cole is calling to find out if Dr. [name] has gotten her Medical Records.  She said it has been over a month and she wants to know if she should go ahead and make an appt with Dr. [name].

## 2011-03-27 NOTE — Telephone Encounter (Signed)
Faxed out referral again. It was faxed out the first time 03/10/2011 by Bradly Bienenstock

## 2011-03-28 NOTE — Telephone Encounter (Signed)
Patient has appointment set up 2/13 @ 1:30pm w/Dr. [name]

## 2011-04-02 ENCOUNTER — Emergency Department (HOSPITAL_COMMUNITY): Payer: Self-pay

## 2011-04-02 ENCOUNTER — Emergency Department (HOSPITAL_COMMUNITY)
Admission: EM | Admit: 2011-04-02 | Discharge: 2011-04-02 | Disposition: A | Discharge status: Home / Self Care | Payer: Self-pay | Attending: Emergency Medicine | Admitting: Emergency Medicine

## 2011-04-02 ENCOUNTER — Encounter (HOSPITAL_COMMUNITY): Payer: Self-pay

## 2011-04-02 DIAGNOSIS — M25579 Pain in unspecified ankle and joints of unspecified foot: Secondary | ICD-10-CM | POA: Insufficient documentation

## 2011-04-02 DIAGNOSIS — M545 Low back pain, unspecified: Secondary | ICD-10-CM | POA: Insufficient documentation

## 2011-04-02 DIAGNOSIS — M25569 Pain in unspecified knee: Secondary | ICD-10-CM | POA: Insufficient documentation

## 2011-04-02 DIAGNOSIS — M129 Arthropathy, unspecified: Secondary | ICD-10-CM | POA: Insufficient documentation

## 2011-04-02 DIAGNOSIS — F341 Dysthymic disorder: Secondary | ICD-10-CM | POA: Insufficient documentation

## 2011-04-02 DIAGNOSIS — Z79899 Other long term (current) drug therapy: Secondary | ICD-10-CM | POA: Insufficient documentation

## 2011-04-02 DIAGNOSIS — M25476 Effusion, unspecified foot: Secondary | ICD-10-CM | POA: Insufficient documentation

## 2011-04-02 DIAGNOSIS — M25473 Effusion, unspecified ankle: Secondary | ICD-10-CM | POA: Insufficient documentation

## 2011-04-02 DIAGNOSIS — G8929 Other chronic pain: Secondary | ICD-10-CM | POA: Insufficient documentation

## 2011-04-02 MED ORDER — HYDROCODONE-ACETAMINOPHEN 5-325 MG PO TABS: 2.0000 | ORAL_TABLET | Freq: Four times a day (QID) | ORAL | Status: AC | PRN

## 2011-04-02 MED ORDER — HYDROCODONE-ACETAMINOPHEN 5-325 MG PO TABS
2.0000 | ORAL_TABLET | Freq: Once | ORAL | Status: AC
  Administered 2011-04-02: 2 via ORAL
  Filled 2011-04-02: qty 2

## 2011-04-02 NOTE — ED Notes (Signed)
Complains of back and hip pain, knee pain and ankle pain, started 3 years ago, sts that she is being treated for this, hx of back fracture, frequent falls and twisting of ankle while walking dog and sts has twisted her knee when she was in competitive dancing, out of medication since yesterday

## 2011-04-02 NOTE — ED Provider Notes (Addendum)
History     CSN: [id number]  Arrival date & time 04/02/11  1445   First MD Initiated Contact with Patient 04/02/11 1559      Chief Complaint  Patient presents with  . Hip Pain  . Back Pain    (Consider location/radiation/quality/duration/timing/severity/associated sxs/prior treatment) HPI Patient is a 66 year old female who presents today complaining of chronic right knee pain as well as concern over her left ankle injury and chronic back pain. She denies any incontinence or saddle anesthesia. Patient has been seen for knee pain previously. At last visit patient was advised to followup with her primary care provider. She tells me that she hasn't appointment on the 23rd. Patient has not had any new injuries aside from twisting her ankle while walking the dog. She says her pain is a 10 out of 10. She denies any numbness or tingling. She has had some difficulty with ambulation. She denies any urinary symptoms, nausea, vomiting, or fevers.There are no other associated or modifying factors.  Past Medical History  Diagnosis Date  . Anxiety   . Headache   . Chronic back pain   . Arthritis   . Depression     Past Surgical History  Procedure Date  . Tubal ligation   . Cervical cone biopsy     History reviewed. No pertinent family history.  History  Substance Use Topics  . Smoking status: Former Smoker -- 1.0 packs/day    Types: Cigarettes  . Smokeless tobacco: Not on file  . Alcohol Use: Yes     occ    OB History    Grav Para Term Preterm Abortions TAB SAB Ect Mult Living                  Review of Systems  Constitutional: Negative.   HENT: Negative.   Eyes: Negative.   Respiratory: Negative.   Cardiovascular: Negative.   Gastrointestinal: Negative.   Genitourinary: Negative.   Musculoskeletal: Negative.        See history of present illness  Skin: Negative.   Neurological: Negative.   Hematological: Negative.   Psychiatric/Behavioral: Negative.   All other  systems reviewed and are negative.    Allergies  Review of patient's allergies indicates no known allergies.  Home Medications   Current Outpatient Rx  Name Route Sig Dispense Refill  . ALPRAZOLAM 0.5 MG PO TABS Oral Take 0.5 mg by mouth 3 (three) times daily.     Marland Kitchen VITAMIN D PO Oral Take 1 tablet by mouth daily.    Marland Kitchen HYDROCODONE-ACETAMINOPHEN 5-500 MG PO TABS Oral Take 1 tablet by mouth 3 (three) times daily.    Marland Kitchen FISH OIL PO Oral Take 1 capsule by mouth daily.    Marland Kitchen OVER THE COUNTER MEDICATION Oral Take 1 scoop by mouth daily. Complete Greens.  Mix with 8 oz. Of green tea    . NYQUIL PO Oral Take 30 mLs by mouth at bedtime as needed. To help sleep.    Marland Kitchen HYDROCODONE-ACETAMINOPHEN 5-325 MG PO TABS Oral Take 2 tablets by mouth every 6 (six) hours as needed for pain. 50 tablet 0    BP 154/99  Pulse 105  Temp(Src) 98.3 F (36.8 C) (Oral)  Resp 20  SpO2 96%  Physical Exam  Nursing note and vitals reviewed. GEN: Well-developed, well-nourished female in no distress HEENT: Atraumatic, normocephalic. Oropharynx clear without erythema EYES: PERRLA BL, no scleral icterus. NECK: Trachea midline, no meningismus CV: regular rate and rhythm. No murmurs, rubs, or gallops  PULM: No respiratory distress.  No crackles, wheezes, or rales. GI: soft, non-tender. No guarding, rebound, or tenderness. + bowel sounds  Neuro: cranial nerves 2-12 intact, no abnormalities of strength or sensation, A and O x 3 MSK: Bilateral lower extremities without swelling. TP and  DP pulses are 2+ bilaterally. Patient with no deformities noted. She has some tenderness on palpation of the right medial malleolus. Patient has no ligamentous instability of the knee noted. She has paraspinal muscle tenderness on the right side of her lower back. Psych: no abnormality of mood   ED Course  Procedures (including critical care time)  Labs Reviewed - No data to display Dg Lumbar Spine Complete  04/02/2011  *RADIOLOGY  REPORT*  Clinical Data: Low back pain  LUMBAR SPINE - COMPLETE 4+ VIEW  Comparison: None.  Findings: Five lumbar-type vertebral bodies.  No evidence of fracture or dislocation.  Vertebral body heights are maintained.  Moderate multilevel degenerative changes.  Grade 1 anterolisthesis of L4 on L5.  IMPRESSION: No fracture or dislocation is seen.  Moderate multilevel degenerative changes with grade 1 anterolisthesis of L4 on L5.  Original Report Authenticated By: Charline Bills, M.D.   Dg Ankle Complete Right  04/02/2011  *RADIOLOGY REPORT*  Clinical Data: Right ankle pain  RIGHT ANKLE - COMPLETE 3+ VIEW  Comparison: None.  Findings: No evidence of acute fracture or dislocation.  The ankle mortise is intact.  Well corticated osseous density inferior to the medial malleolus, possibly the sequela of prior trauma.  The base of the fifth metatarsal is unremarkable.  The visualized soft tissues are unremarkable.  IMPRESSION: No acute fracture or dislocation is seen.  Original Report Authenticated By: Charline Bills, M.D.   Dg Knee Complete 4 Views Right  04/02/2011  *RADIOLOGY REPORT*  Clinical Data: Right knee pain  RIGHT KNEE - COMPLETE 4+ VIEW  Comparison: None.  Findings: Mild to moderate tricompartmental degenerative changes, most prominent in the patellofemoral compartment.  No fracture or dislocation is seen.  The visualized soft tissues are unremarkable.  No suprapatellar knee joint effusion.  IMPRESSION: No fracture or dislocation is seen.  Mild to moderate tricompartmental degenerative changes.  Original Report Authenticated By: Charline Bills, M.D.     1. Chronic knee pain   2. Chronic back pain       MDM  Patient was evaluated by myself. All of her symptoms appear to be chronic in nature a part from having possible right ankle injury. Patient did not have swelling or any difference in pulses between the 2 legs. Patient had not had recent imaging. A plain film of her L-spine as well as her  right knee and ankle was performed. Patient no concerning neurologic signs. Her no significant findings noted. Patient was given Vicodin and her pain resolved. She was discharged with a prescription of 50 of these. She was told that she would need to space these out so that they would last until her next appointment. She was told that they may not be refilled in the future. Patient was discharged in good condition.       Cyndra Numbers, MD 04/02/11 0272  Cyndra Numbers, MD 04/02/11 [phone]

## 2011-04-03 ENCOUNTER — Ambulatory Visit: Payer: Self-pay | Admitting: Internal Medicine

## 2011-04-05 ENCOUNTER — Ambulatory Visit: Payer: Self-pay | Admitting: Family Medicine

## 2011-04-18 ENCOUNTER — Encounter: Payer: Self-pay | Admitting: Family Medicine

## 2011-04-18 ENCOUNTER — Ambulatory Visit (INDEPENDENT_AMBULATORY_CARE_PROVIDER_SITE_OTHER): Payer: Self-pay | Admitting: Family Medicine

## 2011-04-18 VITALS — BP 145/92 | HR 99 | Temp 98.5°F | Ht 67.5 in | Wt 206.5 lb

## 2011-04-18 DIAGNOSIS — I10 Essential (primary) hypertension: Secondary | ICD-10-CM

## 2011-04-18 DIAGNOSIS — M545 Low back pain: Secondary | ICD-10-CM

## 2011-04-18 DIAGNOSIS — M549 Dorsalgia, unspecified: Secondary | ICD-10-CM

## 2011-04-18 DIAGNOSIS — J40 Bronchitis, not specified as acute or chronic: Secondary | ICD-10-CM

## 2011-04-18 DIAGNOSIS — F410 Panic disorder [episodic paroxysmal anxiety] without agoraphobia: Secondary | ICD-10-CM | POA: Insufficient documentation

## 2011-04-18 MED ORDER — GUAIFENESIN 200 MG PO TABS: 400.0000 mg | ORAL_TABLET | ORAL | Status: DC | PRN

## 2011-04-18 MED ORDER — PAROXETINE HCL 10 MG PO TABS: 10.0000 mg | ORAL_TABLET | ORAL | Status: DC

## 2011-04-18 MED ORDER — ALBUTEROL SULFATE HFA 108 (90 BASE) MCG/ACT IN AERS: 2.0000 | INHALATION_SPRAY | Freq: Four times a day (QID) | RESPIRATORY_TRACT | Status: DC | PRN

## 2011-04-18 MED ORDER — HYDROCODONE-ACETAMINOPHEN 5-325 MG PO TABS: 1.0000 | ORAL_TABLET | Freq: Four times a day (QID) | ORAL | Status: AC | PRN

## 2011-04-18 NOTE — Progress Notes (Signed)
Subjective:    Patient ID: Alexis Cole, female    DOB: 01/02/1946, 66 y.o.   MRN: [id number]  HPI Office visit #2 with me. Pt comes today with multiple complaints. 1. She is a smoker and has had bronchitis episodes in the past. She comes today complaining of productive cough for about 2-3 weeks. It all started as a cold with one episode of feeling febrile (no temp checked) and this was followed by cough that has not improved. Also complaints of pressure on her right maxillary sinus. We have very little information about Chronic problems of this pt but she says she has Hx of frequent sinusitis. No chest pain, no SOB, no orthopnea.  2. Pt also complaint about back pain. She was seen recently at [facility] ED due to back, Right Knee and Ankle pain. Xrays were performed with no other findings but mild to moderate degenerative changes with no fractures.  She has been on Hydrocodone - Acetaminophen 5/325 in the past with her prior PCP for her chronic pain. She works as Producer, television/film/video and she states that this medication has help her to maintain function and able to work. She mention an historical MRI done of her back in [place] [facility] that showed "something" on her back for what she was started on narcotic pain controlled medication. She has been on this for several years with no need to increase dose. Her back pain is described to be on lumbar area with right sided irradiation. Knees and ankle hurt independently of her back pain and now her left knee starting to hurt too. No urinary or fecal incontinence reported. 3. Panic attacks: Pt on Xanax for this condition. Long term medication( can't precise years). She has been taking 1/2 tab before bed and this spaces her crisis to a 1-2 every month. Without treatment she has them 1-2 a week. They are described as " something goes up from my back like the chills" and then I know I am going to have a panic attack, I start to feel like foggy around me and my heart start to  race" I try to go to a place were I can control it by breathing exercises. This episodes last for 20 min approximately. She ran out of medication and has been without xanax for more than a month, se is open to start other treatments that can help her condition. No actual symptoms of benzo withdraw.  Pt seems sad and upset that her health has deteriorated. She is self pay and will receive medicare in 4-5 months. Wants to wait to get lab or imagine done to that date. Review of Systems  Constitutional: Positive for chills.  HENT: Positive for sinus pressure.   Respiratory: Positive for cough and wheezing. Negative for chest tightness and shortness of breath.   Cardiovascular: Positive for palpitations. Negative for chest pain and leg swelling.  Gastrointestinal: Negative.  Negative for abdominal distention.  Genitourinary: Negative.   Musculoskeletal: Positive for back pain, arthralgias and gait problem.  Neurological: Negative for dizziness, weakness, numbness and headaches.  Psychiatric/Behavioral: Negative for suicidal ideas and hallucinations. The patient is nervous/anxious.        Objective:   Physical Exam  Constitutional: She is oriented to person, place, and time. She appears distressed.       Mildly to moderately distressed due to pain. But cooperative and pleasant.  HENT:  Head: Normocephalic and atraumatic.  Mouth/Throat: Oropharynx is clear and moist.       Pain to palpation  on right maxillary sinus.   Eyes: Conjunctivae and EOM are normal. Pupils are equal, round, and reactive to light.  Neck: Normal range of motion. Neck supple. No thyromegaly present.  Cardiovascular: Normal rate, regular rhythm, normal heart sounds and intact distal pulses.  Exam reveals no gallop and no friction rub.   No murmur heard. Pulmonary/Chest: Effort normal. No respiratory distress. She has wheezes.       Diminish breath sounds bilaterally. Presence of rhonchi and wheezes. No rales on auscultation.    Abdominal: Soft. Bowel sounds are normal. She exhibits no distension. There is no tenderness.  Musculoskeletal: She exhibits tenderness. She exhibits no edema.       Back pain to palpation on lumbar area. Positive leg rise on the right. Antalgic gait.  Normal range of motion of Knee and ankles. Tender to passive motion. No erythema or edema on joints  Lymphadenopathy:    She has no cervical adenopathy.  Neurological: She is alert and oriented to person, place, and time. She has normal reflexes.  Skin: No rash noted.  Psychiatric: Her behavior is normal. Thought content normal.          Assessment & Plan:

## 2011-04-18 NOTE — Assessment & Plan Note (Addendum)
Hypertension present during visit. We think pt was anxious and in pain. We discussed healthy diet to encourage weight loss. Will reassess on next visit if still elevated we will discusse with pt the option to start pharmacologic treatment.

## 2011-04-18 NOTE — Patient Instructions (Signed)
For your bronchitis I recommended you to take guaifenesin as prescribed and albuterol only when wheezing or difficulty breathing. This may make your hear feel like racing. This is secondary effect of that medication. For your back pain I will only prescribe one time hydrocodone/acetaminophen 5/325. I strongly recommend for chronic pain management to get evaluated at the pain clinic. The order for referral has been placed on this visit.  For your panic attacks I recommend you to start on Paxil 1 tab of 10 mg daily in the morning.  Please make an f/u appointment in 2-3 weeks.

## 2011-04-18 NOTE — Assessment & Plan Note (Addendum)
Increase of frequency of productive cough and presence with wheezing and rhonchi on physical exam. Smoker. Possible pt has chronic bronchitis with exacerbation. Plan: Guaifenesin, Albuterol, reevaluate if no improvement.

## 2011-04-18 NOTE — Assessment & Plan Note (Signed)
Pt treated in the past with Hydrocodone/Acetaminophen 5/325 and this keeps her functional. No definitive diagnosis than degenerative changes on xrays. Pt with no financial resources for more diagnostic tests. Physical exam with positive R straight leg raise. No sfincter dysfunction. Has tried other antiinflammatory medications with no relieve.  Plan: One time Hydrocodone/ Acetaminophen 5/325 (60 tab) until pt gets establish with pain clinic for more chronic management.  Declines imaging studies at this time due to financial limitations (pt did not qualified for orange card). in 4-5 month she will be receiving medicare and at that point she is willing to get more imaging tests.

## 2011-04-18 NOTE — Assessment & Plan Note (Signed)
Pt prior on benzodiazepine. More than 1 month off. No withdraw symptoms reported. Panic attacks that last for 20 min and happens 1-2 times a week. In the past they were spaced to 1-2 in a month. Plan: Paxil start on 10 mg a day. Preferable morning dose Will increase dose if needed.

## 2011-04-19 ENCOUNTER — Ambulatory Visit (INDEPENDENT_AMBULATORY_CARE_PROVIDER_SITE_OTHER): Payer: Self-pay | Admitting: Family Medicine

## 2011-04-19 ENCOUNTER — Encounter: Payer: Self-pay | Admitting: Family Medicine

## 2011-04-19 VITALS — BP 155/103 | HR 92 | Temp 97.9°F | Ht 67.0 in | Wt 206.0 lb

## 2011-04-19 DIAGNOSIS — M545 Low back pain: Secondary | ICD-10-CM

## 2011-04-19 MED ORDER — CYCLOBENZAPRINE HCL 10 MG PO TABS: 10.0000 mg | ORAL_TABLET | Freq: Three times a day (TID) | ORAL | Status: DC | PRN

## 2011-04-19 NOTE — Patient Instructions (Signed)
You have lumbar radiculopathy (a pinched nerve in your low back). Take tylenol for baseline pain relief (1-2 extra strength tabs 3x/day) A prednisone dose pack is the best option for immediate relief and may be prescribed with transition to an anti-inflammatory like aleve or meloxicam (if you do not have stomach or kidney issues). Tramadol or vicodin as needed for severe pain (no driving on this medicine). Flexeril as needed for muscle spasms (no driving on this medicine). Stay as active as possible. Physical therapy has been shown to be helpful as well. Strengthening of low back muscles, abdominal musculature are key for long term pain relief. If not improving, will consider further imaging (MRI) and/or other medications (neurontin, lyrica, nortriptyline) that help with pain.  Arthritis instructions: Take tylenol 500mg  1-2 tabs three times a day for pain. Aleve 1-2 tabs twice a day with food Glucosamine sulfate 750mg  twice a day is a supplement that has been shown to help moderate to severe arthritis. Capsaicin topically up to four times a day may also help with pain. Cortisone injections are an option. If cortisone injections do not help, there are different types of shots that may help but they take longer to take effect. It's important that you continue to stay active. If you are overweight, try to lose weight through diet and exercise. Consider physical therapy to strengthen muscles around the joint that hurts to take pressure off of the joint itself. Shoe inserts with good arch support may be helpful. Walker or cane if needed. Heat or ice 15 minutes at a time 3-4 times a day as needed to help with pain. Water aerobics and cycling with low resistance are the best two types of exercise for arthritis. A knee brace may be helpful if your knee feels unstable due to pain.

## 2011-04-20 ENCOUNTER — Telehealth: Payer: Self-pay | Admitting: *Deleted

## 2011-04-20 NOTE — Telephone Encounter (Signed)
LVM for patient to call back to inform of below and see what she would like to do

## 2011-04-20 NOTE — Telephone Encounter (Signed)
Spoke with patient and she will get back with [place] in a couple of weeks when she decides which way she would like to go with this. She is waiting for a check and they will decide form there.  Also patient states that the Z Pack she was supposed to be prescribed was not sent in, but a rx for Paxil is there and she doesn't take that. She would also like for doctor to know that she cannot take antidepressants due to her suicidal tendencies with them. She says that Paxil is the medication she attempted to slice her wrists on years ago

## 2011-04-20 NOTE — Telephone Encounter (Signed)
Received call from April @ [facility].  Patient was referred by Dr. [name].  Patient is uninsured and will be self-pay.  Required to pay $365 at 1st visit if she is accepted as a patient and then pay her bill of $257.  Phone number that was given for patient is not in service and our office will need to contact patient to see if she is able to afford the visit.  Will route to white team to contact patient and call back.  Gaylene Brooks, RN

## 2011-04-21 ENCOUNTER — Encounter: Payer: Self-pay | Admitting: Family Medicine

## 2011-04-21 NOTE — Progress Notes (Signed)
Subjective:    Patient ID: Alexis Cole, female    DOB: 1945-11-06, 66 y.o.   MRN: [id number]  PCP: [place]  HPI 66 yo F here for right leg pain.  Patient reports about 2 1/2 years ago was when pain in right leg started.  Recalls having had 4 falls with the most recent being 3 weeks ago when she stepped in a hole and fell to the side. Reports weakness, instability throughout entire right leg that contributes to her leg giving out and her falling. Pain has worsened over past 2 1/2 years. Describes shooting pain starting both in right low back/hip down to knee and vice versa. No numbness/tingling. No bowel/bladder dysfunction. Currently taking vicodin for pain. Lack of insurance is a major barrier to her care - will go on medicare in about 5 months. Being referred to pain clinic by her PCP. Has not recently done PT.  Past Medical History  Diagnosis Date  . Anxiety   . Headache   . Chronic back pain   . Arthritis   . Depression     Current Outpatient Prescriptions on File Prior to Visit  Medication Sig Dispense Refill  . albuterol (PROVENTIL HFA;VENTOLIN HFA) 108 (90 BASE) MCG/ACT inhaler Inhale 2 puffs into the lungs every 6 (six) hours as needed for wheezing.  1 Inhaler  0  . Cholecalciferol (VITAMIN D PO) Take 1 tablet by mouth daily.      Marland Kitchen guaiFENesin 200 MG tablet Take 2 tablets (400 mg total) by mouth every 4 (four) hours as needed for congestion.  30 suppository  0  . HYDROcodone-acetaminophen (NORCO) 5-325 MG per tablet Take 1 tablet by mouth every 6 (six) hours as needed for pain.  60 tablet  0  . Omega-3 Fatty Acids (FISH OIL PO) Take 1 capsule by mouth daily.      Marland Kitchen OVER THE COUNTER MEDICATION Take 1 scoop by mouth daily. Complete Greens.  Mix with 8 oz. Of green tea      . PARoxetine (PAXIL) 10 MG tablet Take 1 tablet (10 mg total) by mouth every morning.  30 tablet  3  . Pseudoeph-Doxylamine-DM-APAP (NYQUIL PO) Take 30 mLs by mouth at bedtime as needed. To help sleep.         Past Surgical History  Procedure Date  . Tubal ligation   . Cervical cone biopsy     No Known Allergies  History   Social History  . Marital Status: Divorced    Spouse Name: N/A    Number of Children: N/A  . Years of Education: N/A   Occupational History  . Not on file.   Social History Main Topics  . Smoking status: Current Everyday Smoker -- 1.0 packs/day    Types: Cigarettes  . Smokeless tobacco: Not on file  . Alcohol Use: Yes     occ  . Drug Use: No  . Sexually Active: Not on file   Other Topics Concern  . Not on file   Social History Narrative   ** Merged History Encounter **     Family History  Problem Relation Age of Onset  . Hypertension Mother   . Hyperlipidemia Neg Hx   . Heart attack Neg Hx   . Diabetes Neg Hx   . Sudden death Neg Hx     BP 155/103  Pulse 92  Temp(Src) 97.9 F (36.6 C) (Oral)  Ht 5\' 7"  (1.702 m)  Wt 206 lb (93.441 kg)  BMI 32.26 kg/m2  Review  of Systems See HPI above.    Objective:   Physical Exam Gen: NAD  Back: No gross deformity, scoliosis. TTP R lumbar paraspinal muscles, buttock.  No midline or bony TTP. FROM with pain on full flexion and extension. Strength LEs 5/5 all muscle groups except 4/5 with right hip flexion.   2+ MSRs in patellar and achilles tendons, equal bilaterally. Mild positive SLR on right, negative left. Sensation intact to light touch bilaterally. No groin pain with hip logroll but mild-mod limitation of motion R > L hips Negative fabers and piriformis stretches.  R knee: No gross deformity, ecchymoses, swelling.  1+ crepitation. TTP posterior patellar facets.  No joint line or other TTP. FROM. Negative ant/post drawers. Negative valgus/varus testing. Negative lachmanns. Negative mcmurrays, apleys, patellar apprehension, + clarkes. NV intact distally.    Assessment & Plan:  1. Low back pain - consistent with lumbar radiculopathy.  Known moderate DDD from radiographs done 2/10.   This along with probable disc bulges in back likely contributing to lumbar radiculopathy.  Lack of insurance is major barrier to care.  Discussed PT though with length of time of her pain, severity, weakness, I doubt this would be of significant help.  Agree with moving forward with MRI when she gets medicare.  She declined prednisone.  Discussed other otc nsaids to try but these upset her stomach.  Start flexeril for muscle spasms.  Agree with pain clinic referral as well.  Rx hydrocodone from her PCP as well.  See instructions.  2. Right knee pain - likely 2/2 DJD - has mild-mod DJD with most significant findings at PF joint which is where she is sore.  Declined injection today.  Discussed tylenol, glucosamine, capsaicin, PT.  See instructions for further.

## 2011-04-21 NOTE — Assessment & Plan Note (Signed)
consistent with lumbar radiculopathy.  Known moderate DDD from radiographs done 2/10.  This along with probable disc bulges in back likely contributing to lumbar radiculopathy.  Lack of insurance is major barrier to care.  Discussed PT though with length of time of her pain, severity, weakness, I doubt this would be of significant help.  Agree with moving forward with MRI when she gets medicare.  She declined prednisone.  Discussed other otc nsaids to try but these upset her stomach.  Start flexeril for muscle spasms.  Agree with pain clinic referral as well.  Rx hydrocodone from her PCP as well.  See instructions.

## 2011-04-21 NOTE — Assessment & Plan Note (Signed)
Right knee pain likely 2/2 DJD - has mild-mod DJD with most significant findings at PF joint which is where she is sore.  Declined injection today.  Discussed tylenol, glucosamine, capsaicin, PT.  See instructions for further.

## 2011-04-24 ENCOUNTER — Telehealth: Payer: Self-pay | Admitting: Family Medicine

## 2011-04-24 NOTE — Telephone Encounter (Signed)
Patient is calling for [place] Dec or Dr. [name] wanting to know if the Rx was changed to Z pak, she also would like someone to tell Dr. Gypsy Lore  About her X rays because the Orthopaedic MD said they were useless because they were laying down but should have been while she was standing up, and she wants to know if she has sent the referral for pain clinic.

## 2011-04-25 NOTE — Telephone Encounter (Signed)
LVM informing patient of below.

## 2011-04-25 NOTE — Telephone Encounter (Signed)
I will discontinue Paxil. She told me she was open to the idea of SSRI to manage her panic attacks. Pt self paid and Paxil was the closest option taking account of pt financial situation. She will have to schedule an appointment to discuss this further. Her past visit took me 45 min.  Zpack will be sent to her pharmacy. DP

## 2011-04-25 NOTE — Telephone Encounter (Signed)
Patient was prescribed Paxil and that is not what was supposed to be prescribed. She was supposed to be prescribed a Z pac for her bronchitis.

## 2011-04-26 ENCOUNTER — Telehealth: Payer: Self-pay | Admitting: *Deleted

## 2011-04-26 ENCOUNTER — Other Ambulatory Visit: Payer: Self-pay | Admitting: Family Medicine

## 2011-04-26 DIAGNOSIS — J40 Bronchitis, not specified as acute or chronic: Secondary | ICD-10-CM

## 2011-04-26 MED ORDER — AZITHROMYCIN 250 MG PO TABS: 500.0000 mg | ORAL_TABLET | Freq: Every day | ORAL | Status: AC

## 2011-04-26 NOTE — Telephone Encounter (Signed)
[facility] pharmacy calling to verify med that was E-Rx'd for Z-Pak 250 mg  #6 tabs---take 2 tabs daily.  Wants to know if this is correct or does patient need to take 2 tabs on day 1, then 1 tab on days 2 through 5.  Will route note to Dr. Maida Sale pool for clarification and call pharmacy back.  Gaylene Brooks, RN

## 2011-04-27 NOTE — Telephone Encounter (Signed)
Pharmacy calling again.  Has not heard from [facility].  Needs to know about dosage Please call [facility] - [phone]

## 2011-04-27 NOTE — Telephone Encounter (Signed)
Paged Dr. [name] and she verifies dosage at 2 tabs of 250 mg  tabs Azithromycin daily. ( 500 mg total daily )

## 2011-05-03 ENCOUNTER — Telehealth: Payer: Self-pay | Admitting: Family Medicine

## 2011-05-03 NOTE — Telephone Encounter (Signed)
Patient is calling to ask questions about the pain clinic.

## 2011-05-03 NOTE — Telephone Encounter (Signed)
Called pt to get clarity on pain clinic. She wanted to know what it taking so long for the pain clinic. I told her that I would check on this tomorrow, also she wanted to know if she needed to pay the fee for her records being released to our office. I told her that I did not think that there was a charge involved with this but I would ask our medical records staff about this. Lastly she is needing help with her medications and she cannot afford them and wanted to know what she could do I asked her if she applied for [place] [place] and she stated that she has and was denied. I told her to look on line with the manufacture to see if she could get help that way. She stated that she would look into that.  Pt would like to speak with Dr. [name] she can be reached at [phone].Loralee Pacas [place]

## 2011-05-08 NOTE — Telephone Encounter (Signed)
Has this been addressed?

## 2011-05-15 NOTE — Telephone Encounter (Signed)
Called pt and lvm informing her that due to her lack of insurance at this time she will have to pay out of pocket to be seen by Pain clinic the cost of that is $365.00 up front and $257.00 due at check in. I also told pt that I spoke with someone Arlys John) her to see if she had any insight as to how we can help this pt and she stated that there were no resources that she could refer her to that could assist her with helping with paying for these appts that she has. If pt should have any additional questions she can call our office.Loralee Pacas [place]

## 2011-06-12 ENCOUNTER — Telehealth: Payer: Self-pay | Admitting: Family Medicine

## 2011-06-12 NOTE — Telephone Encounter (Signed)
Patient wants to speak to MD about the last Xrays that were done.  Also wants to know if any of the bloodwork she is going to have done will tell her if she has Diabetes.

## 2011-06-20 NOTE — Telephone Encounter (Signed)
Has this been addressed?

## 2011-06-22 NOTE — Telephone Encounter (Signed)
Alexis Abed, MD 06/22/2011 12:36 AM Signed  I gave pt the results of her Xrays during her last visit with me. They only showed mild degenerative changes.  Regarding the labs, we discussed that A1C will be part of the bloodwork. Pt stated at that time that she wanted to wait until she was able to get Medicare in a couple of months. She differed the labs to that point since she applied but did not qualified for the [employer] and has financial limitations.

## 2011-06-22 NOTE — Telephone Encounter (Signed)
I gave pt the results of her Xrays during her last visit with me. They only showed mild degenerative changes.  Regarding the labs, we discussed that A1C will be part of the bloodwork. Pt stated at that time that she wanted to wait until she was able to get Medicare in a couple of months. She differed the labs to that point since she applied but did not qualified for the [employer] and has financial limitations.  DP

## 2011-08-17 ENCOUNTER — Encounter (HOSPITAL_COMMUNITY): Payer: Self-pay | Admitting: Pharmacy Technician

## 2011-08-21 ENCOUNTER — Other Ambulatory Visit (INDEPENDENT_AMBULATORY_CARE_PROVIDER_SITE_OTHER): Payer: Medicare Other

## 2011-08-21 DIAGNOSIS — Z79899 Other long term (current) drug therapy: Secondary | ICD-10-CM

## 2011-08-21 DIAGNOSIS — I10 Essential (primary) hypertension: Secondary | ICD-10-CM

## 2011-08-21 LAB — CBC WITH DIFFERENTIAL/PLATELET
Basophils Absolute: 0.1 10*3/uL (ref 0.0–0.1)
HCT: 41.7 % (ref 36.0–46.0)
Hemoglobin: 14.5 g/dL (ref 12.0–15.0)
Lymphocytes Relative: 45 % (ref 12–46)
Lymphs Abs: 4.2 10*3/uL — ABNORMAL HIGH (ref 0.7–4.0)
MCV: 91.9 fL (ref 78.0–100.0)
Monocytes Absolute: 0.7 10*3/uL (ref 0.1–1.0)
Monocytes Relative: 7 % (ref 3–12)
Neutro Abs: 4.3 10*3/uL (ref 1.7–7.7)
RBC: 4.54 MIL/uL (ref 3.87–5.11)
RDW: 13.9 % (ref 11.5–15.5)
WBC: 9.3 10*3/uL (ref 4.0–10.5)

## 2011-08-21 LAB — COMPREHENSIVE METABOLIC PANEL
BUN: 15 mg/dL (ref 6–23)
CO2: 25 mEq/L (ref 19–32)
Calcium: 10.6 mg/dL — ABNORMAL HIGH (ref 8.4–10.5)
Chloride: 105 mEq/L (ref 96–112)
Creat: 0.57 mg/dL (ref 0.50–1.10)
Glucose, Bld: 113 mg/dL — ABNORMAL HIGH (ref 70–99)
Total Bilirubin: 0.5 mg/dL (ref 0.3–1.2)

## 2011-08-21 LAB — LIPID PANEL
HDL: 42 mg/dL (ref >39)
LDL Cholesterol: 169 mg/dL — ABNORMAL HIGH (ref 0–99)
Total CHOL/HDL Ratio: 5.5 Ratio
Triglycerides: 101 mg/dL (ref <150)

## 2011-08-21 NOTE — Progress Notes (Signed)
CMP,FLP,CBC AND A1C DONE TODAY Alexis Cole

## 2011-08-28 ENCOUNTER — Encounter (HOSPITAL_COMMUNITY): Payer: Self-pay

## 2011-08-28 ENCOUNTER — Encounter (HOSPITAL_COMMUNITY)
Admission: RE | Admit: 2011-08-28 | Discharge: 2011-08-28 | Disposition: A | Discharge status: Home / Self Care | Payer: Medicare Other | Source: Ambulatory Visit | Attending: Orthopedic Surgery | Admitting: Orthopedic Surgery

## 2011-08-28 HISTORY — DX: Personal history of other diseases of the respiratory system: Z87.09

## 2011-08-28 HISTORY — DX: Acne, unspecified: L70.9

## 2011-08-28 HISTORY — DX: Anemia, unspecified: D64.9

## 2011-08-28 HISTORY — DX: Gastric ulcer, unspecified as acute or chronic, without hemorrhage or perforation: K25.9

## 2011-08-28 HISTORY — DX: Pain in unspecified joint: M25.50

## 2011-08-28 HISTORY — DX: Chronic sinusitis, unspecified: J32.9

## 2011-08-28 HISTORY — DX: Personal history of other diseases of the nervous system and sense organs: Z86.69

## 2011-08-28 HISTORY — DX: Effusion, unspecified joint: M25.40

## 2011-08-28 HISTORY — DX: Insomnia, unspecified: G47.00

## 2011-08-28 LAB — SURGICAL PCR SCREEN: Staphylococcus aureus: NEGATIVE

## 2011-08-28 LAB — APTT: aPTT: 33 seconds (ref 24–37)

## 2011-08-28 LAB — ABO/RH: ABO/RH(D): O POS

## 2011-08-28 LAB — TYPE AND SCREEN: Antibody Screen: NEGATIVE

## 2011-08-28 LAB — PROTIME-INR
INR: 0.96 (ref 0.00–1.49)
Prothrombin Time: 13 seconds (ref 11.6–15.2)

## 2011-08-28 NOTE — Pre-Procedure Instructions (Signed)
20 LASANDRA BATLEY  08/28/2011   Your procedure is scheduled on:  Fri, July 12 @ 7:30 AM  Report to [facility] at 5:30 AM.  Call this number if you have problems the morning of surgery: [phone]   Remember:   Do not eat food:After Midnight.    Take these medicines the morning of surgery with A SIP OF WATER: Pain Pill(if needed)   Do not wear jewelry, make-up or nail polish.  Do not wear lotions, powders, or perfumes.  Do not shave 48 hours prior to surgery.   Do not bring valuables to the hospital.  Contacts, dentures or bridgework may not be worn into surgery.  Leave suitcase in the car. After surgery it may be brought to your room.  For patients admitted to the hospital, checkout time is 11:00 AM the day of discharge.   Patients discharged the day of surgery will not be allowed to drive home.  Special Instructions: CHG Shower Use Special Wash: 1/2 bottle night before surgery and 1/2 bottle morning of surgery.   Please read over the following fact sheets that you were given: Pain Booklet, Coughing and Deep Breathing, Blood Transfusion Information, Total Joint Packet, MRSA Information and Surgical Site Infection Prevention

## 2011-08-28 NOTE — Progress Notes (Signed)
Orders requested from Darl Pikes at [facility] office

## 2011-08-28 NOTE — Progress Notes (Signed)
Pt doesn't have a cardiologist  Denies ever having a stress test/echo/heart cath  Medical MD is [facility]--Dr.[place]  Last CXR was 68yrs ago  No recent EKG

## 2011-08-31 ENCOUNTER — Encounter: Payer: Self-pay | Admitting: Family Medicine

## 2011-08-31 NOTE — Progress Notes (Signed)
Darl Pikes at Dr. Luiz Blare office(#[phone]) notified that orders still not received-message left on voice mail.//L. Ananias Kolander,RN

## 2011-09-01 ENCOUNTER — Encounter (HOSPITAL_COMMUNITY)
Admission: RE | Disposition: A | Discharge status: Home Health | Payer: Self-pay | Source: Ambulatory Visit | Attending: Orthopedic Surgery

## 2011-09-01 ENCOUNTER — Ambulatory Visit (HOSPITAL_COMMUNITY): Payer: Medicare Other

## 2011-09-01 ENCOUNTER — Encounter (HOSPITAL_COMMUNITY): Payer: Self-pay | Admitting: Anesthesiology

## 2011-09-01 ENCOUNTER — Ambulatory Visit (HOSPITAL_COMMUNITY): Payer: Medicare Other | Admitting: Anesthesiology

## 2011-09-01 ENCOUNTER — Inpatient Hospital Stay (HOSPITAL_COMMUNITY)
Admission: RE | Admit: 2011-09-01 | Discharge: 2011-09-04 | DRG: 470 | Disposition: A | Discharge status: Home Health | Payer: Medicare Other | Source: Ambulatory Visit | Attending: Orthopedic Surgery | Admitting: Orthopedic Surgery

## 2011-09-01 DIAGNOSIS — Z87891 Personal history of nicotine dependence: Secondary | ICD-10-CM

## 2011-09-01 DIAGNOSIS — F3289 Other specified depressive episodes: Secondary | ICD-10-CM | POA: Diagnosis present

## 2011-09-01 DIAGNOSIS — Z01812 Encounter for preprocedural laboratory examination: Secondary | ICD-10-CM

## 2011-09-01 DIAGNOSIS — Z780 Asymptomatic menopausal state: Secondary | ICD-10-CM

## 2011-09-01 DIAGNOSIS — Z23 Encounter for immunization: Secondary | ICD-10-CM

## 2011-09-01 DIAGNOSIS — Z7901 Long term (current) use of anticoagulants: Secondary | ICD-10-CM

## 2011-09-01 DIAGNOSIS — M1610 Unilateral primary osteoarthritis, unspecified hip: Principal | ICD-10-CM | POA: Diagnosis present

## 2011-09-01 DIAGNOSIS — F411 Generalized anxiety disorder: Secondary | ICD-10-CM | POA: Diagnosis present

## 2011-09-01 DIAGNOSIS — M545 Low back pain: Secondary | ICD-10-CM

## 2011-09-01 DIAGNOSIS — M549 Dorsalgia, unspecified: Secondary | ICD-10-CM | POA: Diagnosis present

## 2011-09-01 DIAGNOSIS — M1611 Unilateral primary osteoarthritis, right hip: Secondary | ICD-10-CM | POA: Diagnosis present

## 2011-09-01 DIAGNOSIS — G4700 Insomnia, unspecified: Secondary | ICD-10-CM | POA: Diagnosis present

## 2011-09-01 DIAGNOSIS — F329 Major depressive disorder, single episode, unspecified: Secondary | ICD-10-CM | POA: Diagnosis present

## 2011-09-01 DIAGNOSIS — G8929 Other chronic pain: Secondary | ICD-10-CM | POA: Diagnosis present

## 2011-09-01 DIAGNOSIS — M169 Osteoarthritis of hip, unspecified: Principal | ICD-10-CM | POA: Diagnosis present

## 2011-09-01 DIAGNOSIS — Z79899 Other long term (current) drug therapy: Secondary | ICD-10-CM

## 2011-09-01 DIAGNOSIS — F410 Panic disorder [episodic paroxysmal anxiety] without agoraphobia: Secondary | ICD-10-CM

## 2011-09-01 HISTORY — PX: TOTAL HIP ARTHROPLASTY: SHX124

## 2011-09-01 SURGERY — ARTHROPLASTY, HIP, TOTAL,POSTERIOR APPROACH
Anesthesia: General | Site: Hip | Laterality: Right | Wound class: Clean

## 2011-09-01 SURGERY — ARTHROPLASTY, HIP, TOTAL,POSTERIOR APPROACH
Anesthesia: General | Laterality: Right

## 2011-09-01 MED ORDER — FENTANYL CITRATE 0.05 MG/ML IJ SOLN
INTRAMUSCULAR | Status: DC | PRN
  Administered 2011-09-01: 50 ug via INTRAVENOUS
  Administered 2011-09-01 (×2): 100 ug via INTRAVENOUS

## 2011-09-01 MED ORDER — WARFARIN SODIUM 5 MG PO TABS
5.0000 mg | ORAL_TABLET | Freq: Once | ORAL | Status: AC
  Administered 2011-09-01: 5 mg via ORAL
  Filled 2011-09-01: qty 1

## 2011-09-01 MED ORDER — MENTHOL 3 MG MT LOZG: 1.0000 | LOZENGE | OROMUCOSAL | Status: DC | PRN

## 2011-09-01 MED ORDER — ACETAMINOPHEN 10 MG/ML IV SOLN
INTRAVENOUS | Status: AC
  Filled 2011-09-01: qty 100

## 2011-09-01 MED ORDER — GLYCOPYRROLATE 0.2 MG/ML IJ SOLN
INTRAMUSCULAR | Status: DC | PRN
  Administered 2011-09-01: 0.6 mg via INTRAVENOUS

## 2011-09-01 MED ORDER — NALOXONE HCL 0.4 MG/ML IJ SOLN: 0.4000 mg | INTRAMUSCULAR | Status: DC | PRN

## 2011-09-01 MED ORDER — BUPIVACAINE-EPINEPHRINE 0.5% -1:200000 IJ SOLN
INTRAMUSCULAR | Status: DC | PRN
  Administered 2011-09-01: 20 mL

## 2011-09-01 MED ORDER — MORPHINE SULFATE (PF) 1 MG/ML IV SOLN
INTRAVENOUS | Status: DC
  Administered 2011-09-01: 10:00:00 via INTRAVENOUS
  Administered 2011-09-01: 2 mg via INTRAVENOUS
  Administered 2011-09-02: 5 mg via INTRAVENOUS
  Administered 2011-09-02: 2 mg via INTRAVENOUS
  Administered 2011-09-02: 1 mg via INTRAVENOUS

## 2011-09-01 MED ORDER — BUPIVACAINE-EPINEPHRINE (PF) 0.5% -1:200000 IJ SOLN
INTRAMUSCULAR | Status: AC
  Filled 2011-09-01: qty 10

## 2011-09-01 MED ORDER — WARFARIN - PHARMACIST DOSING INPATIENT: Freq: Every day | Status: DC

## 2011-09-01 MED ORDER — ONDANSETRON HCL 4 MG/2ML IJ SOLN: 4.0000 mg | Freq: Four times a day (QID) | INTRAMUSCULAR | Status: DC | PRN

## 2011-09-01 MED ORDER — LACTATED RINGERS IV SOLN
INTRAVENOUS | Status: DC | PRN
  Administered 2011-09-01 (×3): via INTRAVENOUS

## 2011-09-01 MED ORDER — PROPOFOL 10 MG/ML IV EMUL
INTRAVENOUS | Status: DC | PRN
  Administered 2011-09-01: 200 mg via INTRAVENOUS

## 2011-09-01 MED ORDER — SENNOSIDES-DOCUSATE SODIUM 8.6-50 MG PO TABS: 1.0000 | ORAL_TABLET | Freq: Every evening | ORAL | Status: DC | PRN

## 2011-09-01 MED ORDER — DIPHENHYDRAMINE HCL 50 MG/ML IJ SOLN: 12.5000 mg | Freq: Four times a day (QID) | INTRAMUSCULAR | Status: DC | PRN

## 2011-09-01 MED ORDER — PROMETHAZINE HCL 25 MG/ML IJ SOLN: 12.5000 mg | Freq: Four times a day (QID) | INTRAMUSCULAR | Status: DC | PRN

## 2011-09-01 MED ORDER — HYDROMORPHONE HCL PF 1 MG/ML IJ SOLN
INTRAMUSCULAR | Status: AC
  Filled 2011-09-01: qty 1

## 2011-09-01 MED ORDER — KETOROLAC TROMETHAMINE 15 MG/ML IJ SOLN
15.0000 mg | Freq: Four times a day (QID) | INTRAMUSCULAR | Status: AC
  Administered 2011-09-01 – 2011-09-02 (×3): 15 mg via INTRAVENOUS
  Filled 2011-09-01 (×4): qty 1

## 2011-09-01 MED ORDER — 0.9 % SODIUM CHLORIDE (POUR BTL) OPTIME
TOPICAL | Status: DC | PRN
  Administered 2011-09-01: 1000 mL

## 2011-09-01 MED ORDER — PHENOL 1.4 % MT LIQD: 1.0000 | OROMUCOSAL | Status: DC | PRN

## 2011-09-01 MED ORDER — LACTATED RINGERS IV SOLN: INTRAVENOUS | Status: DC

## 2011-09-01 MED ORDER — CEFAZOLIN SODIUM-DEXTROSE 2-3 GM-% IV SOLR
2.0000 g | INTRAVENOUS | Status: AC
  Administered 2011-09-01: 2 g via INTRAVENOUS
  Filled 2011-09-01: qty 50

## 2011-09-01 MED ORDER — HYDROMORPHONE HCL PF 1 MG/ML IJ SOLN
0.2500 mg | INTRAMUSCULAR | Status: DC | PRN
  Administered 2011-09-01 (×2): 0.5 mg via INTRAVENOUS

## 2011-09-01 MED ORDER — COUMADIN BOOK
Freq: Once | Status: AC
  Administered 2011-09-02: 09:00:00
  Filled 2011-09-01: qty 1

## 2011-09-01 MED ORDER — DROPERIDOL 2.5 MG/ML IJ SOLN
INTRAMUSCULAR | Status: DC | PRN
  Administered 2011-09-01: 0.625 mg via INTRAVENOUS

## 2011-09-01 MED ORDER — DIPHENHYDRAMINE HCL 12.5 MG/5ML PO ELIX: 12.5000 mg | ORAL_SOLUTION | Freq: Four times a day (QID) | ORAL | Status: DC | PRN

## 2011-09-01 MED ORDER — DOCUSATE SODIUM 100 MG PO CAPS
100.0000 mg | ORAL_CAPSULE | Freq: Two times a day (BID) | ORAL | Status: DC
  Administered 2011-09-01 – 2011-09-04 (×7): 100 mg via ORAL
  Filled 2011-09-01 (×8): qty 1

## 2011-09-01 MED ORDER — ACETAMINOPHEN 10 MG/ML IV SOLN
INTRAVENOUS | Status: DC | PRN
  Administered 2011-09-01: 1000 mg via INTRAVENOUS

## 2011-09-01 MED ORDER — KETOROLAC TROMETHAMINE 30 MG/ML IJ SOLN
INTRAMUSCULAR | Status: AC
  Administered 2011-09-01: 15 mg
  Filled 2011-09-01: qty 1

## 2011-09-01 MED ORDER — ALUM & MAG HYDROXIDE-SIMETH 200-200-20 MG/5ML PO SUSP: 30.0000 mL | ORAL | Status: DC | PRN

## 2011-09-01 MED ORDER — ONDANSETRON HCL 4 MG/2ML IJ SOLN
INTRAMUSCULAR | Status: DC | PRN
  Administered 2011-09-01 (×2): 4 mg via INTRAVENOUS

## 2011-09-01 MED ORDER — LIDOCAINE HCL (CARDIAC) 20 MG/ML IV SOLN: INTRAVENOUS | Status: DC | PRN

## 2011-09-01 MED ORDER — ARTIFICIAL TEARS OP OINT
TOPICAL_OINTMENT | OPHTHALMIC | Status: DC | PRN
  Administered 2011-09-01: 1 via OPHTHALMIC

## 2011-09-01 MED ORDER — WARFARIN VIDEO: Freq: Once | Status: DC

## 2011-09-01 MED ORDER — ZOLPIDEM TARTRATE 5 MG PO TABS: 5.0000 mg | ORAL_TABLET | Freq: Every evening | ORAL | Status: DC | PRN

## 2011-09-01 MED ORDER — LIDOCAINE HCL 4 % MT SOLN
OROMUCOSAL | Status: DC | PRN
  Administered 2011-09-01: 4 mL via TOPICAL

## 2011-09-01 MED ORDER — ONDANSETRON HCL 4 MG/2ML IJ SOLN: 4.0000 mg | Freq: Once | INTRAMUSCULAR | Status: DC | PRN

## 2011-09-01 MED ORDER — CHLORHEXIDINE GLUCONATE 4 % EX LIQD: 60.0000 mL | Freq: Once | CUTANEOUS | Status: DC

## 2011-09-01 MED ORDER — MORPHINE SULFATE (PF) 1 MG/ML IV SOLN
INTRAVENOUS | Status: AC
  Filled 2011-09-01: qty 25

## 2011-09-01 MED ORDER — OXYCODONE HCL 5 MG PO TABS
5.0000 mg | ORAL_TABLET | ORAL | Status: DC | PRN
  Administered 2011-09-02 – 2011-09-04 (×12): 10 mg via ORAL
  Filled 2011-09-01 (×12): qty 2

## 2011-09-01 MED ORDER — NEOSTIGMINE METHYLSULFATE 1 MG/ML IJ SOLN
INTRAMUSCULAR | Status: DC | PRN
  Administered 2011-09-01: 4 mg via INTRAVENOUS

## 2011-09-01 MED ORDER — CEFAZOLIN SODIUM-DEXTROSE 2-3 GM-% IV SOLR
2.0000 g | Freq: Four times a day (QID) | INTRAVENOUS | Status: AC
  Administered 2011-09-01 (×2): 2 g via INTRAVENOUS
  Filled 2011-09-01 (×2): qty 50

## 2011-09-01 MED ORDER — LIDOCAINE HCL (CARDIAC) 20 MG/ML IV SOLN
INTRAVENOUS | Status: DC | PRN
  Administered 2011-09-01: 60 mg via INTRAVENOUS

## 2011-09-01 MED ORDER — ROCURONIUM BROMIDE 100 MG/10ML IV SOLN
INTRAVENOUS | Status: DC | PRN
  Administered 2011-09-01: 40 mg via INTRAVENOUS

## 2011-09-01 MED ORDER — ACETAMINOPHEN 10 MG/ML IV SOLN
1000.0000 mg | Freq: Four times a day (QID) | INTRAVENOUS | Status: AC
  Administered 2011-09-01 – 2011-09-02 (×4): 1000 mg via INTRAVENOUS
  Filled 2011-09-01 (×4): qty 100

## 2011-09-01 MED ORDER — FERROUS SULFATE 325 (65 FE) MG PO TABS
325.0000 mg | ORAL_TABLET | Freq: Two times a day (BID) | ORAL | Status: DC
  Administered 2011-09-01 – 2011-09-04 (×6): 325 mg via ORAL
  Filled 2011-09-01 (×8): qty 1

## 2011-09-01 MED ORDER — CYCLOBENZAPRINE HCL 10 MG PO TABS
10.0000 mg | ORAL_TABLET | Freq: Three times a day (TID) | ORAL | Status: DC | PRN
  Administered 2011-09-02 – 2011-09-04 (×4): 10 mg via ORAL
  Filled 2011-09-01 (×4): qty 1

## 2011-09-01 MED ORDER — ONDANSETRON HCL 4 MG PO TABS: 4.0000 mg | ORAL_TABLET | Freq: Four times a day (QID) | ORAL | Status: DC | PRN

## 2011-09-01 MED ORDER — KETOROLAC TROMETHAMINE 30 MG/ML IJ SOLN
INTRAMUSCULAR | Status: DC | PRN
  Administered 2011-09-01: 30 mg via INTRAVENOUS

## 2011-09-01 MED ORDER — LABETALOL HCL 5 MG/ML IV SOLN
INTRAVENOUS | Status: DC | PRN
  Administered 2011-09-01 (×2): 5 mg via INTRAVENOUS

## 2011-09-01 MED ORDER — DEXTROSE-NACL 5-0.45 % IV SOLN
INTRAVENOUS | Status: DC
  Administered 2011-09-01: 13:00:00 via INTRAVENOUS
  Administered 2011-09-02: 75 mL via INTRAVENOUS

## 2011-09-01 MED ORDER — SODIUM CHLORIDE 0.9 % IJ SOLN: 9.0000 mL | INTRAMUSCULAR | Status: DC | PRN

## 2011-09-01 SURGICAL SUPPLY — 61 items
BLADE SAW SAG 73X25 THK (BLADE) ×1
BLADE SAW SGTL 73X25 THK (BLADE) ×1 IMPLANT
BRUSH FEMORAL CANAL (MISCELLANEOUS) IMPLANT
CLOTH BEACON ORANGE TIMEOUT ST (SAFETY) ×2 IMPLANT
COVER BACK TABLE 24X17X13 BIG (DRAPES) IMPLANT
COVER SURGICAL LIGHT HANDLE (MISCELLANEOUS) ×2 IMPLANT
DRAPE ORTHO SPLIT 77X108 STRL (DRAPES) ×2
DRAPE PROXIMA HALF (DRAPES) ×2 IMPLANT
DRAPE SURG ORHT 6 SPLT 77X108 (DRAPES) ×2 IMPLANT
DRAPE U-SHAPE 47X51 STRL (DRAPES) ×2 IMPLANT
DRILL BIT 7/64X5 (BIT) ×2 IMPLANT
DRSG MEPILEX BORDER 4X12 (GAUZE/BANDAGES/DRESSINGS) ×2 IMPLANT
DURAPREP 26ML APPLICATOR (WOUND CARE) ×2 IMPLANT
ELECT BLADE 6.5 EXT (BLADE) ×2 IMPLANT
ELECT CAUTERY BLADE 6.4 (BLADE) ×2 IMPLANT
ELECT REM PT RETURN 9FT ADLT (ELECTROSURGICAL) ×2
ELECTRODE REM PT RTRN 9FT ADLT (ELECTROSURGICAL) ×1 IMPLANT
EVACUATOR 1/8 PVC DRAIN (DRAIN) IMPLANT
GLOVE BIO SURGEON STRL SZ8.5 (GLOVE) ×2 IMPLANT
GLOVE BIOGEL PI IND STRL 7.5 (GLOVE) ×1 IMPLANT
GLOVE BIOGEL PI IND STRL 8 (GLOVE) ×2 IMPLANT
GLOVE BIOGEL PI INDICATOR 7.5 (GLOVE) ×1
GLOVE BIOGEL PI INDICATOR 8 (GLOVE) ×2
GLOVE ECLIPSE 7.5 STRL STRAW (GLOVE) ×4 IMPLANT
GLOVE SURG SS PI 7.0 STRL IVOR (GLOVE) ×2 IMPLANT
GLOVE SURG SS PI 8.5 STRL IVOR (GLOVE) ×1
GLOVE SURG SS PI 8.5 STRL STRW (GLOVE) ×1 IMPLANT
GOWN PREVENTION PLUS XLARGE (GOWN DISPOSABLE) ×2 IMPLANT
GOWN SRG XL XLNG 56XLVL 4 (GOWN DISPOSABLE) ×2 IMPLANT
GOWN STRL NON-REIN LRG LVL3 (GOWN DISPOSABLE) IMPLANT
GOWN STRL NON-REIN XL XLG LVL4 (GOWN DISPOSABLE) ×2
GOWN STRL REIN 2XL LVL4 (GOWN DISPOSABLE) ×2 IMPLANT
HOOD PEEL AWAY FACE SHEILD DIS (HOOD) ×6 IMPLANT
IMMOBILIZER KNEE 20 (SOFTGOODS)
IMMOBILIZER KNEE 20 THIGH 36 (SOFTGOODS) IMPLANT
IMMOBILIZER KNEE 22 UNIV (SOFTGOODS) ×2 IMPLANT
IMMOBILIZER KNEE 24 THIGH 36 (MISCELLANEOUS) IMPLANT
IMMOBILIZER KNEE 24 UNIV (MISCELLANEOUS)
KIT BASIN OR (CUSTOM PROCEDURE TRAY) ×2 IMPLANT
KIT ROOM TURNOVER OR (KITS) ×2 IMPLANT
MANIFOLD NEPTUNE II (INSTRUMENTS) ×2 IMPLANT
NEEDLE 1/2 CIR MAYO (NEEDLE) ×2 IMPLANT
NEEDLE 22X1 1/2 (OR ONLY) (NEEDLE) ×2 IMPLANT
NS IRRIG 1000ML POUR BTL (IV SOLUTION) ×2 IMPLANT
PACK TOTAL JOINT (CUSTOM PROCEDURE TRAY) ×2 IMPLANT
PAD ARMBOARD 7.5X6 YLW CONV (MISCELLANEOUS) ×4 IMPLANT
PASSER SUT SWANSON 36MM LOOP (INSTRUMENTS) IMPLANT
PRESSURIZER FEMORAL UNIV (MISCELLANEOUS) IMPLANT
STAPLER VISISTAT 35W (STAPLE) ×2 IMPLANT
SUCTION FRAZIER TIP 10 FR DISP (SUCTIONS) ×2 IMPLANT
SUT ETHIBOND 2 V 37 (SUTURE) ×2 IMPLANT
SUT VIC AB 0 CTXB 36 (SUTURE) ×4 IMPLANT
SUT VIC AB 1 CTX 36 (SUTURE) ×1
SUT VIC AB 1 CTX36XBRD ANBCTR (SUTURE) ×1 IMPLANT
SUT VIC AB 2-0 CTB1 (SUTURE) ×2 IMPLANT
SYR CONTROL 10ML LL (SYRINGE) ×2 IMPLANT
TOWEL OR 17X24 6PK STRL BLUE (TOWEL DISPOSABLE) ×2 IMPLANT
TOWEL OR 17X26 10 PK STRL BLUE (TOWEL DISPOSABLE) ×2 IMPLANT
TOWER CARTRIDGE SMART MIX (DISPOSABLE) IMPLANT
TRAY FOLEY CATH 14FR (SET/KITS/TRAYS/PACK) ×2 IMPLANT
WATER STERILE IRR 1000ML POUR (IV SOLUTION) ×2 IMPLANT

## 2011-09-01 NOTE — Transfer of Care (Signed)
Immediate Anesthesia Transfer of Care Note  Patient: Alexis Cole  Procedure(s) Performed: Procedure(s) (LRB): TOTAL HIP ARTHROPLASTY (Right)  Patient Location: PACU  Anesthesia Type: General  Level of Consciousness: awake, alert , oriented and sedated  Airway & Oxygen Therapy: Patient Spontanous Breathing and Patient connected to nasal cannula oxygen  Post-op Assessment: Report given to PACU RN, Post -op Vital signs reviewed and stable and Patient moving all extremities  Post vital signs: Reviewed and stable  Complications: No apparent anesthesia complications

## 2011-09-01 NOTE — H&P (Signed)
PREOPERATIVE H&P  Chief Complaint: right hip pain  HPI: Alexis Cole is a 66 y.o. female who presents for evaluation of right hip pain. It has been present for greater than 6 months and has been worsening. She has failed conservative measures including activity modification, use of cane, injection therapy, and medication.she has x-rays showing bone-on-bone changes in the right hip. Pain is rated as severe.  Past Medical History  Diagnosis Date  . Headache   . Chronic back pain   . Arthritis   . Depression   . Anxiety     but doesn't take any meds for this  . Sinusitis   . History of bronchitis     last time 6months  . Hx of migraines     last one a month ago  . Joint pain   . Joint swelling   . Chronic back pain   . Acne     but not being treated for  . Gastric ulcer     history of--many yrs ago  . Anemia     history of--many yrs ago  . Insomnia     Nyquil nightly   Past Surgical History  Procedure Date  . Cervical cone biopsy   . Nasal septum surgery 40+yrs ago  . Mouth surgery   . Dilation and curettage of uterus 30+yrs ago  . Tubal ligation 30+yrs ago   History   Social History  . Marital Status: Divorced    Spouse Name: N/A    Number of Children: N/A  . Years of Education: N/A   Social History Main Topics  . Smoking status: Former Smoker -- 0.5 packs/day for 15 years    Types: Cigarettes  . Smokeless tobacco: Not on file   Comment: quit smoking 1 month ago  . Alcohol Use: Yes     wine with red meat;but stopped a month ago  . Drug Use: No  . Sexually Active: Yes    Birth Control/ Protection: Post-menopausal   Other Topics Concern  . Not on file   Social History Narrative   ** Merged History Encounter **    Family History  Problem Relation Age of Onset  . Hypertension Mother   . Hyperlipidemia Neg Hx   . Heart attack Neg Hx   . Diabetes Neg Hx   . Sudden death Neg Hx    No Known Allergies Prior to Admission medications   Medication Sig  Start Date End Date Taking? Authorizing Provider  Cholecalciferol (VITAMIN D PO) Take 1 tablet by mouth daily.   Yes Historical Provider, MD  cyclobenzaprine (FLEXERIL) 10 MG tablet Take 1 tablet (10 mg total) by mouth 3 (three) times daily as needed. For muscle spasm 04/19/11  Yes Alexis Kelp, MD  HYDROcodone-acetaminophen (VICODIN) 5-500 MG per tablet Take 1 tablet by mouth daily.   Yes Historical Provider, MD  Omega-3 Fatty Acids (FISH OIL PO) Take 1 capsule by mouth daily.   Yes Historical Provider, MD  OVER THE COUNTER MEDICATION Take 1 scoop by mouth daily. Complete Greens.  Mix with 8 oz. Of green tea   Yes Historical Provider, MD  Pseudoeph-Doxylamine-DM-APAP (NYQUIL PO) Take 30 mLs by mouth at bedtime as needed. To help sleep.   Yes Historical Provider, MD     Positive ROS: none  All other systems have been reviewed and were otherwise negative with the exception of those mentioned in the HPI and as above.  Physical Exam: Filed Vitals:   09/01/11 0557  BP: 160/101  Pulse: 85  Temp: 98.5 F (36.9 C)  Resp: 20    General: Alert, no acute distress Cardiovascular: No pedal edema Respiratory: No cyanosis, no use of accessory musculature GI: No organomegaly, abdomen is soft and non-tender Skin: No lesions in the area of chief complaint Neurologic: Sensation intact distally Psychiatric: Patient is competent for consent with normal mood and affect Lymphatic: No axillary or cervical lymphadenopathy  MUSCULOSKELETAL: right hip essentially no internal rotation.  Painful flexion and extension.  2+ distal pulses.  Neurovascularly intact distally  Assessment/Plan: DEGENERATIVE JOINT DISEASE right hip Plan for Procedure(s): TOTAL HIP ARTHROPLASTY right  The risks benefits and alternatives were discussed with the patient including but not limited to the risks of nonoperative treatment, versus surgical intervention including infection, bleeding, nerve injury, malunion, nonunion,  hardware prominence, hardware failure, need for hardware removal, blood clots, cardiopulmonary complications, morbidity, mortality, among others, and they were willing to proceed.  Predicted outcome is good, although there will be at least a six to nine month expected recovery.  Alexis Cole L, MD 09/01/2011 6:11 AM

## 2011-09-01 NOTE — Progress Notes (Signed)
Orthopedic Tech Progress Note Patient Details:  Alexis Cole 1945/08/05 [id number]  Patient ID: Alexis Cole, female   DOB: 1945-10-19, 66 y.o.   MRN: [id number] Trapeze bar patient helper;viewed order from doctor's order list  Nikki Dom 09/01/2011, 4:51 PM

## 2011-09-01 NOTE — Progress Notes (Signed)
UR COMPLETED  

## 2011-09-01 NOTE — Anesthesia Preprocedure Evaluation (Signed)
Anesthesia Evaluation  Patient identified by MRN, date of birth, ID band Patient awake    Reviewed: Allergy & Precautions, H&P , NPO status , Patient's Chart, lab work & pertinent test results  Airway Mallampati: II TM Distance: >3 FB Neck ROM: full    Dental   Pulmonary          Cardiovascular hypertension, Rhythm:regular Rate:Normal     Neuro/Psych  Headaches, PSYCHIATRIC DISORDERS    GI/Hepatic PUD,   Endo/Other  Morbid obesity  Renal/GU      Musculoskeletal   Abdominal   Peds  Hematology   Anesthesia Other Findings   Reproductive/Obstetrics                           Anesthesia Physical Anesthesia Plan  ASA: II  Anesthesia Plan: General   Post-op Pain Management:    Induction: Intravenous  Airway Management Planned: Oral ETT  Additional Equipment:   Intra-op Plan:   Post-operative Plan: Extubation in OR  Informed Consent: I have reviewed the patients History and Physical, chart, labs and discussed the procedure including the risks, benefits and alternatives for the proposed anesthesia with the patient or authorized representative who has indicated his/her understanding and acceptance.     Plan Discussed with: CRNA, Anesthesiologist and Surgeon  Anesthesia Plan Comments:         Anesthesia Quick Evaluation

## 2011-09-01 NOTE — Plan of Care (Signed)
Problem: Consults Goal: Diagnosis- Total Joint Replacement Primary Total Hip     

## 2011-09-01 NOTE — Brief Op Note (Signed)
09/01/2011  2:41 PM  PATIENT:  Alexis Cole  66 y.o. female  PRE-OPERATIVE DIAGNOSIS:  DEGENERATIVE JOINT DISEASE, right hip  POST-OPERATIVE DIAGNOSIS:  DEGENERATIVE JOINT DISEASE, right hip  PROCEDURE:  Procedure(s) (LRB): TOTAL HIP ARTHROPLASTY (Right)  SURGEON:  Surgeon(s) and Role:    * Harvie Junior, MD - Primary  PHYSICIAN ASSISTANT:   ASSISTANTS: bethune   ANESTHESIA:   general  EBL:  Total I/O In: 2200 [I.V.:2200] Out: 525 [Urine:525]  BLOOD ADMINISTERED:none  DRAINS: none   LOCAL MEDICATIONS USED:  MARCAINE     SPECIMEN:  No Specimen  DISPOSITION OF SPECIMEN:  N/A  COUNTS:  YES  TOURNIQUET:  * No tourniquets in log *  DICTATION: .Other Dictation: Dictation Number [id number]  PLAN OF CARE:admit to inpatient  PATIENT DISPOSITION:  PACU - hemodynamically stable.   Delay start of Pharmacological VTE agent (>24hrs) due to surgical blood loss or risk of bleeding: no

## 2011-09-01 NOTE — Anesthesia Postprocedure Evaluation (Signed)
  Anesthesia Post-op Note  Patient: Alexis Cole  Procedure(s) Performed: Procedure(s) (LRB): TOTAL HIP ARTHROPLASTY (Right)  Patient Location: PACU  Anesthesia Type: General  Level of Consciousness: awake, alert , oriented and patient cooperative  Airway and Oxygen Therapy: Patient Spontanous Breathing and Patient connected to nasal cannula oxygen  Post-op Pain: mild  Post-op Assessment: Post-op Vital signs reviewed, Patient's Cardiovascular Status Stable, Respiratory Function Stable, Patent Airway, No signs of Nausea or vomiting and Pain level controlled  Post-op Vital Signs: stable  Complications: No apparent anesthesia complications

## 2011-09-01 NOTE — Progress Notes (Signed)
ANTICOAGULATION CONSULT NOTE - Initial Consult  Pharmacy Consult for coumadin Indication: VTE prophylaxis s/p R THA  No Known Allergies  Patient Measurements: Wt=89.5kg  Vital Signs: Temp: 97.2 F (36.2 C) (07/12 1045) Temp src: Oral (07/12 0557) BP: 134/74 mmHg (07/12 1038) Pulse Rate: 75  (07/12 1038)  Labs: No results found for this basename: HGB:2,HCT:3,PLT:3,APTT:3,LABPROT:3,INR:3,HEPARINUNFRC:3,CREATININE:3,CKTOTAL:3,CKMB:3,TROPONINI:3 in the last 72 hours  The CrCl is unknown because both a height and weight (above a minimum accepted value) are required for this calculation.   Medical History: Past Medical History  Diagnosis Date  . Headache   . Chronic back pain   . Arthritis   . Depression   . Anxiety     but doesn't take any meds for this  . Sinusitis   . History of bronchitis     last time 6months  . Hx of migraines     last one a month ago  . Joint pain   . Joint swelling   . Chronic back pain   . Acne     but not being treated for  . Gastric ulcer     history of--many yrs ago  . Anemia     history of--many yrs ago  . Insomnia     Nyquil nightly    Medications:  Prescriptions prior to admission  Medication Sig Dispense Refill  . Cholecalciferol (VITAMIN D PO) Take 1 tablet by mouth daily.      . cyclobenzaprine (FLEXERIL) 10 MG tablet Take 1 tablet (10 mg total) by mouth 3 (three) times daily as needed. For muscle spasm  90 tablet  2  . HYDROcodone-acetaminophen (VICODIN) 5-500 MG per tablet Take 1 tablet by mouth daily.      . Omega-3 Fatty Acids (FISH OIL PO) Take 1 capsule by mouth daily.      Marland Kitchen OVER THE COUNTER MEDICATION Take 1 scoop by mouth daily. Complete Greens.  Mix with 8 oz. Of green tea      . Pseudoeph-Doxylamine-DM-APAP (NYQUIL PO) Take 30 mLs by mouth at bedtime as needed. To help sleep.       Scheduled:    . acetaminophen  1,000 mg Intravenous Q6H  .  ceFAZolin (ANCEF) IV  2 g Intravenous 60 min Pre-Op  .  ceFAZolin (ANCEF)  IV  2 g Intravenous Q6H  . docusate sodium  100 mg Oral BID  . ferrous sulfate  325 mg Oral BID WC  . HYDROmorphone      . ketorolac  15 mg Intravenous Q6H  . morphine   Intravenous Q4H  . morphine      . DISCONTD: chlorhexidine  60 mL Topical Once    Assessment: 66 yo s/p R THA to start coumadin (baseline INR noted as 0.96 on 08/28/11).  Goal of Therapy:  INR goal: 2.0 per MD (treatment for 3 weeks) Monitor platelets by anticoagulation protocol: Yes   Plan:  -Coumadin 5mg  po today -Daily PT/INR -Will begin education process  Harland German, Ilda Basset D 09/01/2011 12:14 PM

## 2011-09-01 NOTE — Op Note (Signed)
Alexis Cole, Alexis Cole NO.:  [id number]  MEDICAL RECORD NO.:  [id number]  LOCATION:  [id number]                        FACILITY:  [facility]  PHYSICIAN:  Harvie Junior, M.D.   DATE OF BIRTH:  December 30, 1945  DATE OF PROCEDURE:  09/01/2011 DATE OF DISCHARGE:                              OPERATIVE REPORT   PREOPERATIVE DIAGNOSIS:  End-stage degenerative joint disease, right hip.  POSTOPERATIVE DIAGNOSIS:  End-stage degenerative joint disease, right hip.  PROCEDURE:  Right total hip replacement with an S-ROM system. We used a 1611 stem with a 60F large cone, 36+ 6 offset neck, +0 ball and a 50-mm Pinnacle cup with a +4 neutral polyethylene liner.  SURGEON:  Harvie Junior, MD.  ASSISTANT:  [name], PA.  ANESTHESIA:  General.  BRIEF HISTORY:  Alexis Cole is a 66 year old female with a long history of having significant right hip pain.  We have treated her conservatively for prolonged period of time and monitored with x-rays. Her x-rays ultimately showed she had bone-on-bone changes and we still continued to follow her for a long time and her hip essentially  melted away with a head really impacting on the lateral acetabulum, and we ultimately felt that she had significant and severe arthritis of the hip and after discussion, she ultimately elected to come to the operating room for a right total hip replacement.  She was brought to the operating room for this procedure.  PROCEDURE:  The patient was taken to the operating room and after adequate anesthesia was obtained with general anesthetic, the patient was placed supine on the operating table.  She was moved to the left lateral decubitus position and all bony prominences were well padded. Attention then turned to the right hip where after routine prep and drape, an incision was made for posterior approach to the hip, subcutaneous tissue down to the level of the tensor fascia.  The tensor fascia then  divided in  line with its fibers with the short external rotators and piriformis were taken down and tagged.  Attention was then turned toward the hip, which was dislocated, and a provisional neck cut was made.  Given the valgus nature of her neck alignment, a high provisional neck cut was made.  Following this, attention was turned to the socket, which was sequentially reamed to a level of 49 mm, and a 50- mm Pinnacle cup was put into place in 45 degrees of lateral opening and 30 degrees of anteversion.  Once this was completed, a neutral liner trial was placed and attention turned to the stem side.  Cookie cutter, introducer, and reamers were used to sequentially ream this femur to a level of 11 mm.  We then took a 11 and 5 mm reamer all the way down, we were getting bone, started about 9.5 mm so we knew this was going to be about as far as we could get, and a 12-mm reamer was taken halfway down. Once this was completed, attention turned toward placement of the cone. We used a 16B, D, and F then; F was got a good shelf anteriorly. Attention then turned toward the spout where we went to a large spout  and we then took a trial 69F large cone and put it in.  We did the 1611 stem with a 36+ 6 offset and got with a +0 ball and got really excellent stability and neutral alignment. We put 10 degrees of anteversion in just to check a little bit better stability with no tendency toward impinging posteriorly and at this point, the trials were removed, the trial liner was removed, hole eliminator placed, and 50 mm polyethylene cup was placed, neutral.  Attention then turned back to the stem side, with the final 69F large cone was placed, the 1611 stem and 10 degrees of anteversion of the stem relative to the cone was placed.  This was put through a range of motion.  Stability was excellent.  No tendency towards posterior impingement that was with a +0 ball trial.  The +0 ball was then opened and placed, and  following this, the short external rotators and piriformis were reattached to the posterior intertrochanteric line through drill holes and the tensor fascia was closed with 1 Vicryl running.  The hip was copiously and thoroughly lavaged and suction dried prior to placement of all of these suture material.  Tensor fascia was closed with 1 Vicryl running, skin with 0 and 2-0 Vicryl, and skin staples.  A sterile compressive dressing was applied as well as a knee immobilizer.  The patient was taken to recovery room where she was noted be in satisfactory condition. Estimated blood loss for the procedure was none.     Harvie Junior, M.D.     [name]  D:  09/01/2011  T:  09/01/2011  Job:  [id number]

## 2011-09-01 NOTE — Progress Notes (Signed)
Pt refused to sign consent until she talks with Dr.Graves again

## 2011-09-01 NOTE — Preoperative (Signed)
Beta Blockers   Reason not to administer Beta Blockers:Not Applicable 

## 2011-09-02 LAB — BASIC METABOLIC PANEL
Chloride: 101 mEq/L (ref 96–112)
Creatinine, Ser: 0.5 mg/dL (ref 0.50–1.10)
GFR calc Af Amer: >90 mL/min (ref >90)
Potassium: 3.2 mEq/L — ABNORMAL LOW (ref 3.5–5.1)
Sodium: 133 mEq/L — ABNORMAL LOW (ref 135–145)

## 2011-09-02 LAB — CBC
MCV: 94.1 fL (ref 78.0–100.0)
MCV: 94.7 fL (ref 78.0–100.0)
Platelets: 159 10*3/uL (ref 150–400)
Platelets: 158 10*3/uL (ref 150–400)
RBC: 3.37 MIL/uL — ABNORMAL LOW (ref 3.87–5.11)
RDW: 13 % (ref 11.5–15.5)
WBC: 9.7 10*3/uL (ref 4.0–10.5)
WBC: 12.8 10*3/uL — ABNORMAL HIGH (ref 4.0–10.5)

## 2011-09-02 LAB — PROTIME-INR
INR: 1.15 (ref 0.00–1.49)
Prothrombin Time: 14.9 seconds (ref 11.6–15.2)
Prothrombin Time: 15.7 seconds — ABNORMAL HIGH (ref 11.6–15.2)

## 2011-09-02 MED ORDER — ACETAMINOPHEN 325 MG PO TABS
650.0000 mg | ORAL_TABLET | ORAL | Status: DC | PRN
  Administered 2011-09-02 – 2011-09-03 (×2): 650 mg via ORAL
  Filled 2011-09-02: qty 2

## 2011-09-02 MED ORDER — WARFARIN SODIUM 5 MG PO TABS
5.0000 mg | ORAL_TABLET | Freq: Once | ORAL | Status: AC
  Administered 2011-09-02: 5 mg via ORAL
  Filled 2011-09-02: qty 1

## 2011-09-02 MED ORDER — POTASSIUM CHLORIDE CRYS ER 20 MEQ PO TBCR
40.0000 meq | EXTENDED_RELEASE_TABLET | Freq: Once | ORAL | Status: AC
  Administered 2011-09-02: 40 meq via ORAL
  Filled 2011-09-02: qty 2

## 2011-09-02 NOTE — Evaluation (Signed)
Physical Therapy Evaluation Patient Details Name: Alexis Cole MRN: [id number] DOB: [birth year] Today's Date: 09/02/2011 Time: 4098-1191 PT Time Calculation (min): 21 min  PT Assessment / Plan / Recommendation Clinical Impression  Pt is a 66 y.o. female who underwent THA 09-01-11.  PT indicated for increased mobility, improved ROM/strength RLE, and to provide education.  Pt with LTG of living alone, independent.    PT Assessment  Patient needs continued PT services    Follow Up Recommendations  Home health PT    Barriers to Discharge None      Equipment Recommendations  None recommended by PT    Recommendations for Other Services     Frequency 7X/week    Precautions / Restrictions Precautions Precautions: Posterior Hip Precaution Comments: Educated pt on 3/3 hip precautions Required Braces or Orthoses: Knee Immobilizer - Right Knee Immobilizer - Right: On at all times Restrictions RLE Weight Bearing: Weight bearing as tolerated   Pertinent Vitals/Pain       Mobility  Bed Mobility Bed Mobility: Supine to Sit Supine to Sit: 4: Min assist Details for Bed Mobility Assistance: verbal cues for sequencing Transfers Transfers: Sit to Stand;Stand to Sit Sit to Stand: 4: Min guard;From chair/3-in-1;With armrests Stand to Sit: 4: Min guard;To chair/3-in-1;With armrests Details for Transfer Assistance: verbal cues for sequencing Ambulation/Gait Ambulation/Gait Assistance: 4: Min guard Ambulation Distance (Feet): 40 Feet Assistive device: Rolling walker Ambulation/Gait Assistance Details: verbal cues for posture, sequencing, hip precautions Gait Pattern: Antalgic;Step-to pattern Gait velocity: decreased    Exercises Total Joint Exercises Ankle Circles/Pumps: AROM;Both;15 reps Quad Sets: AROM;Right;10 reps Gluteal Sets: AROM;Both;10 reps   PT Diagnosis: Difficulty walking;Acute pain  PT Problem List: Decreased strength;Decreased range of motion;Decreased activity  tolerance;Decreased mobility;Decreased knowledge of precautions;Pain PT Treatment Interventions: Gait training;DME instruction;Stair training;Functional mobility training;Therapeutic activities;Therapeutic exercise;Patient/family education   PT Goals Acute Rehab PT Goals PT Goal Formulation: With patient Time For Goal Achievement: 09/09/11 Potential to Achieve Goals: Good Pt will go Supine/Side to Sit: with modified independence PT Goal: Supine/Side to Sit - Progress: Goal set today Pt will go Sit to Supine/Side: with modified independence PT Goal: Sit to Supine/Side - Progress: Goal set today Pt will go Sit to Stand: with modified independence PT Goal: Sit to Stand - Progress: Goal set today Pt will go Stand to Sit: with modified independence PT Goal: Stand to Sit - Progress: Goal set today Pt will Transfer Bed to Chair/Chair to Bed: with modified independence PT Transfer Goal: Bed to Chair/Chair to Bed - Progress: Goal set today Pt will Ambulate: >150 feet;with modified independence;with rolling walker PT Goal: Ambulate - Progress: Goal set today Pt will Go Up / Down Stairs: 3-5 stairs;with rail(s);with min assist PT Goal: Up/Down Stairs - Progress: Goal set today Pt will Perform Home Exercise Program: Independently PT Goal: Perform Home Exercise Program - Progress: Goal set today  Visit Information  Last PT Received On: 09/02/11 Assistance Needed: +1    Subjective Data  Subjective: I am surprised at how sore I am. Patient Stated Goal: independence   Prior Functioning  Home Living Lives With: Alone Available Help at Discharge: Friend(s) Type of Home: House Home Access: Stairs to enter Secretary/administrator of Steps: 4 Entrance Stairs-Rails: Right Home Layout: One level Firefighter: Standard Home Adaptive Equipment: Bedside commode/3-in-1;Walker - rolling;Straight cane Prior Function Level of Independence: Independent Able to Take Stairs?: Yes Driving:  Yes Communication Communication: No difficulties    Cognition  Overall Cognitive Status: Appears within functional limits for tasks  assessed/performed Arousal/Alertness: Awake/alert Orientation Level: Oriented X4 / Intact Behavior During Session: WFL for tasks performed    Extremity/Trunk Assessment     Balance    End of Session PT - End of Session Equipment Utilized During Treatment: Gait belt;Right knee immobilizer Activity Tolerance: Patient tolerated treatment well Patient left: in chair;with call bell/phone within reach;with nursing in room Nurse Communication: Mobility status;Patient requests pain meds  GP     Ilda Foil 09/02/2011, 12:07 PM  Aida Raider, PT  Office # [phone] Pager [phone]

## 2011-09-02 NOTE — Progress Notes (Signed)
Subjective: 1 Day Post-Op Procedure(s) (LRB): TOTAL HIP ARTHROPLASTY (Right)  Activity level:  In bed Diet tolerance:  light Voiding:  ok Patient reports pain as 3 on 0-10 scale.    Objective: Vital signs in last 24 hours: Temp:  [97.2 F (36.2 C)-99.5 F (37.5 C)] 99.5 F (37.5 C) (07/13 0550) Pulse Rate:  [74-98] 98  (07/13 0550) Resp:  [13-24] 18  (07/13 0550) BP: (116-168)/(66-95) 125/66 mmHg (07/13 0550) SpO2:  [94 %-100 %] 98 % (07/13 0550)  Labs:  [facility] 09/02/11 0620  HGB 10.7*    Basename 09/02/11 0620  WBC 9.7  RBC 3.41*  HCT 32.1*  PLT 159    Basename 09/02/11 0620  NA 133*  K 3.2*  CL 101  CO2 24  BUN 6  CREATININE 0.50  GLUCOSE 135*  CALCIUM 8.9    Basename 09/02/11 0620  LABPT --  INR 1.15    Physical Exam:  Neurologically intact ABD soft Neurovascular intact Sensation intact distally Intact pulses distally Dorsiflexion/Plantar flexion intact No cellulitis present Compartment soft  Assessment/Plan:  1 Day Post-Op Procedure(s) (LRB): TOTAL HIP ARTHROPLASTY (Right) Advance diet Up with therapy D/C IV fluids Will d/c pca ang give kcl    Idamae Coccia R 09/02/2011, 8:17 AM

## 2011-09-02 NOTE — Progress Notes (Signed)
ANTICOAGULATION CONSULT NOTE - Follow up Consult  Pharmacy Consult for coumadin Indication: VTE prophylaxis s/p R THA  No Known Allergies  Patient Measurements: Wt=89.5kg  Vital Signs: Temp: 99.5 F (37.5 C) (07/13 0550) BP: 125/66 mmHg (07/13 0550) Pulse Rate: 98  (07/13 0550)  Labs:  [facility] 09/02/11 0620  HGB 10.7*  HCT 32.1*  PLT 159  APTT --  LABPROT 14.9  INR 1.15  HEPARINUNFRC --  CREATININE 0.50  CKTOTAL --  CKMB --  TROPONINI --    The CrCl is unknown because both a height and weight (above a minimum accepted value) are required for this calculation.   Medical History: Past Medical History  Diagnosis Date  . Headache   . Chronic back pain   . Arthritis   . Depression   . Anxiety     but doesn't take any meds for this  . Sinusitis   . History of bronchitis     last time 6months  . Hx of migraines     last one a month ago  . Joint pain   . Joint swelling   . Chronic back pain   . Acne     but not being treated for  . Gastric ulcer     history of--many yrs ago  . Anemia     history of--many yrs ago  . Insomnia     Nyquil nightly    Medications:  Prescriptions prior to admission  Medication Sig Dispense Refill  . Cholecalciferol (VITAMIN D PO) Take 1 tablet by mouth daily.      . cyclobenzaprine (FLEXERIL) 10 MG tablet Take 1 tablet (10 mg total) by mouth 3 (three) times daily as needed. For muscle spasm  90 tablet  2  . HYDROcodone-acetaminophen (VICODIN) 5-500 MG per tablet Take 1 tablet by mouth daily.      . Omega-3 Fatty Acids (FISH OIL PO) Take 1 capsule by mouth daily.      Marland Kitchen OVER THE COUNTER MEDICATION Take 1 scoop by mouth daily. Complete Greens.  Mix with 8 oz. Of green tea      . Pseudoeph-Doxylamine-DM-APAP (NYQUIL PO) Take 30 mLs by mouth at bedtime as needed. To help sleep.       Scheduled:     . acetaminophen  1,000 mg Intravenous Q6H  .  ceFAZolin (ANCEF) IV  2 g Intravenous Q6H  . coumadin book   Does not apply Once    . docusate sodium  100 mg Oral BID  . ferrous sulfate  325 mg Oral BID WC  . HYDROmorphone      . ketorolac  15 mg Intravenous Q6H  . ketorolac      . morphine      . potassium chloride  40 mEq Oral Once  . warfarin  5 mg Oral ONCE-1800  . warfarin   Does not apply Once  . Warfarin - Pharmacist Dosing Inpatient   Does not apply q1800  . DISCONTD: chlorhexidine  60 mL Topical Once  . DISCONTD: morphine   Intravenous Q4H    Assessment: INR 1.15 today in this 66 yo s/p R THA , POD#1 on coumadin for VTE prophylaxis. Baseline INR noted as 0.96 on 08/28/11. H/H 10.7/32.1, PLTC 159K. No bleeding reported.   Goal of Therapy:  INR goal: 2.0 per MD (treatment for 3 weeks) Monitor platelets by anticoagulation protocol: Yes   Plan:  -Coumadin 5mg  po today -Daily PT/INR -Will begin education process  Noah Delaine, RPh Clinical Pharmacist  [phone] 09/02/2011 10:15 AM

## 2011-09-02 NOTE — Progress Notes (Signed)
Pt has temp of 102.2 . Lungs congested. She is reluctant to use IS because of pain when coughing. PA carnega notified. Tylenol prescribed. Pt strongly advised to use IS. Was able to achieve 1250 ml with unproductive cough.

## 2011-09-03 ENCOUNTER — Encounter (HOSPITAL_COMMUNITY): Payer: Self-pay | Admitting: Orthopedic Surgery

## 2011-09-03 LAB — BASIC METABOLIC PANEL
CO2: 22 mEq/L (ref 19–32)
Calcium: 9 mg/dL (ref 8.4–10.5)
Potassium: 3.7 mEq/L (ref 3.5–5.1)
Sodium: 131 mEq/L — ABNORMAL LOW (ref 135–145)

## 2011-09-03 LAB — PROTIME-INR
INR: 1.37 (ref 0.00–1.49)
Prothrombin Time: 17.1 seconds — ABNORMAL HIGH (ref 11.6–15.2)

## 2011-09-03 MED ORDER — WARFARIN SODIUM 5 MG PO TABS
5.0000 mg | ORAL_TABLET | Freq: Once | ORAL | Status: AC
  Administered 2011-09-03: 5 mg via ORAL
  Filled 2011-09-03: qty 1

## 2011-09-03 MED ORDER — PNEUMOCOCCAL VAC POLYVALENT 25 MCG/0.5ML IJ INJ
0.5000 mL | INJECTION | INTRAMUSCULAR | Status: AC
  Administered 2011-09-04: 0.5 mL via INTRAMUSCULAR
  Filled 2011-09-03 (×2): qty 0.5

## 2011-09-03 NOTE — Progress Notes (Signed)
ANTICOAGULATION CONSULT NOTE - Follow up Consult  Pharmacy Consult for coumadin Indication: VTE prophylaxis s/p R THA  No Known Allergies  Patient Measurements: Wt=89.5kg  Vital Signs: Temp: 98.9 F (37.2 C) (07/14 1302) Temp src: Oral (07/14 1302) BP: 102/63 mmHg (07/14 1302) Pulse Rate: 98  (07/14 1302)  Labs:  [facility] 09/03/11 1203 09/02/11 2320 09/02/11 0620  HGB -- 10.7* 10.7*  HCT -- 31.9* 32.1*  PLT -- 158 159  APTT -- -- --  LABPROT 17.1* 15.7* 14.9  INR 1.37 1.22 1.15  HEPARINUNFRC -- -- --  CREATININE -- 0.57 0.50  CKTOTAL -- -- --  CKMB -- -- --  TROPONINI -- -- --    Estimated Creatinine Clearance: 80.6 ml/min (by C-G formula based on Cr of 0.57).   Medical History: Past Medical History  Diagnosis Date  . Headache   . Chronic back pain   . Arthritis   . Depression   . Anxiety     but doesn't take any meds for this  . Sinusitis   . History of bronchitis     last time 6months  . Hx of migraines     last one a month ago  . Joint pain   . Joint swelling   . Chronic back pain   . Acne     but not being treated for  . Gastric ulcer     history of--many yrs ago  . Anemia     history of--many yrs ago  . Insomnia     Nyquil nightly    Medications:  Prescriptions prior to admission  Medication Sig Dispense Refill  . Cholecalciferol (VITAMIN D PO) Take 1 tablet by mouth daily.      . cyclobenzaprine (FLEXERIL) 10 MG tablet Take 1 tablet (10 mg total) by mouth 3 (three) times daily as needed. For muscle spasm  90 tablet  2  . HYDROcodone-acetaminophen (VICODIN) 5-500 MG per tablet Take 1 tablet by mouth daily.      . Omega-3 Fatty Acids (FISH OIL PO) Take 1 capsule by mouth daily.      Marland Kitchen OVER THE COUNTER MEDICATION Take 1 scoop by mouth daily. Complete Greens.  Mix with 8 oz. Of green tea      . Pseudoeph-Doxylamine-DM-APAP (NYQUIL PO) Take 30 mLs by mouth at bedtime as needed. To help sleep.       Scheduled:     . docusate sodium  100 mg  Oral BID  . ferrous sulfate  325 mg Oral BID WC  . pneumococcal 23 valent vaccine  0.5 mL Intramuscular Tomorrow-1000  . warfarin  5 mg Oral ONCE-1800  . warfarin   Does not apply Once  . Warfarin - Pharmacist Dosing Inpatient   Does not apply q1800    Assessment: INR 1.37 today in this 66 yo s/p R THA after two days of coumadin for VTE prophylaxis. Baseline INR noted as 0.96 on 08/28/11. No bleeding reported.   Goal of Therapy:  INR goal: 2.0 per MD (treatment for 3 weeks) Monitor platelets by anticoagulation protocol: Yes   Plan:  -Coumadin 5mg  po today -Daily PT/INR  Abran Duke, PharmD Clinical Pharmacist Phone: [phone] Pager: [phone] 09/03/2011 2:14 PM

## 2011-09-03 NOTE — Progress Notes (Addendum)
Physical Therapy Treatment Patient Details Name: Alexis Cole MRN: [id number] DOB: February 14, 1946 Today's Date: 09/03/2011 Time: 4098-1191 PT Time Calculation (min): 25 min  PT Assessment / Plan / Recommendation Comments on Treatment Session  Pt progressing well with activity.  Needs min cues for overall mobility and safety.    Follow Up Recommendations  Home health PT    Barriers to Discharge        Equipment Recommendations  None recommended by PT    Recommendations for Other Services    Frequency 7X/week   Plan Discharge plan remains appropriate;Frequency remains appropriate    Precautions / Restrictions Precautions Precautions: Posterior Hip Precaution Comments: Educated pt on 3/3 hip precautions Required Braces or Orthoses: Knee Immobilizer - Right Knee Immobilizer - Right: Wear in bed Restrictions Weight Bearing Restrictions: No RLE Weight Bearing: Weight bearing as tolerated   Pertinent Vitals/Pain Pt c/o pain in R hip.  RN alerted and brought meds.    Mobility  Bed Mobility Bed Mobility: Supine to Sit Supine to Sit: 4: Min assist Details for Bed Mobility Assistance: verbal cues for sequencing Transfers Transfers: Sit to Stand;Stand to Sit Sit to Stand: 4: Min guard;From chair/3-in-1;With armrests Stand to Sit: 4: Min guard;To chair/3-in-1;With armrests Details for Transfer Assistance: Verbal cues for sequencing and hand placement Ambulation/Gait Ambulation/Gait Assistance: 4: Min guard Ambulation Distance (Feet): 70 Feet Assistive device: Rolling walker Ambulation/Gait Assistance Details: verbal cues to sequence, for posture and cadence Gait Pattern: Step-through pattern;Antalgic    Exercises Total Joint Exercises Ankle Circles/Pumps: AROM;Both;10 reps;Seated Quad Sets: AROM;Right;10 reps Heel Slides: AAROM;Right;10 reps;Seated Long Arc Quad: AROM;Right;10 reps;Seated       PT Goals Acute Rehab PT Goals Time For Goal Achievement: 09/09/11 Potential  to Achieve Goals: Good PT Goal: Supine/Side to Sit - Progress: Progressing toward goal PT Goal: Sit to Stand - Progress: Progressing toward goal PT Goal: Stand to Sit - Progress: Progressing toward goal PT Transfer Goal: Bed to Chair/Chair to Bed - Progress: Progressing toward goal PT Goal: Ambulate - Progress: Progressing toward goal PT Goal: Perform Home Exercise Program - Progress: Progressing toward goal  Visit Information  Last PT Received On: 09/03/11 Assistance Needed: +1         Cognition  Overall Cognitive Status: Appears within functional limits for tasks assessed/performed Arousal/Alertness: Awake/alert Orientation Level: Oriented X4 / Intact Behavior During Session: [facility] for tasks performed         End of Session PT - End of Session Equipment Utilized During Treatment: Gait belt Activity Tolerance: Patient tolerated treatment well Patient left: in chair;with call bell/phone within reach;with nursing in room Nurse Communication: Mobility status;Patient requests pain meds   Newell Coral 09/03/2011, 9:10 AM  Newell Coral, PTA Acute Rehab [phone] (office)

## 2011-09-03 NOTE — Progress Notes (Signed)
Occupational Therapy Treatment Patient Details Name: Alexis Cole MRN: [id number] DOB: 25-Mar-1945 Today's Date: 09/03/2011 Time: 4098-1191 OT Time Calculation (min): 24 min  OT Assessment / Plan / Recommendation Comments on Treatment Session Excellent progress. Excellent use of AE for LB ADL. will benefit from additional OT session in am. Plan to educate friends who are going to assist pt after D/C.    Follow Up Recommendations  Home health OT    Barriers to Discharge  None    Equipment Recommendations  3 in 1 bedside comode    Recommendations for Other Services    Frequency Min 2X/week   Plan Discharge plan remains appropriate    Precautions / Restrictions Precautions Precautions: Posterior Hip Precaution Booklet Issued: Yes (comment) Precaution Comments: Pt able to recall 2/3 precautions from earlier session Required Braces or Orthoses: Knee Immobilizer - Right Restrictions Weight Bearing Restrictions: No RLE Weight Bearing: Weight bearing as tolerated   Pertinent Vitals/Pain 4    ADL  Eating/Feeding: Performed;Independent Where Assessed - Eating/Feeding: Chair Grooming: Performed;Set up Where Assessed - Grooming: Supported sitting Upper Body Bathing: Simulated;Set up Where Assessed - Upper Body Bathing: Supported sitting Lower Body Bathing: Simulated;Minimal assistance (using LHS) Where Assessed - Lower Body Bathing: Unsupported sit to stand Upper Body Dressing: Simulated;Set up Where Assessed - Upper Body Dressing: Unsupported sitting Lower Body Dressing: Performed;Moderate assistance Where Assessed - Lower Body Dressing: Unsupported sit to stand;Other (comment) (using reacher and sock aid) Toilet Transfer: Research scientist (life sciences) Method: Sit to Barista: Comfort height toilet Toileting - Clothing Manipulation and Hygiene: Simulated;Minimal assistance Where Assessed - Engineer, mining and Hygiene:  Standing Tub/Shower Transfer: Paramedic Method: Science writer: Walk in shower Equipment Used: Gait belt;Long-handled shoe horn;Long-handled sponge;Reacher;Rolling walker;Sock aid Transfers/Ambulation Related to ADLs: supervision ADL Comments: Educated pt on availability and use of AE. Pt began using AE for LB ADL.     OT Diagnosis:    OT Problem List: Decreased strength;Decreased range of motion;Decreased activity tolerance;Decreased knowledge of use of DME or AE;Decreased knowledge of precautions;Pain OT Treatment Interventions: Self-care/ADL training;Energy conservation;DME and/or AE instruction;Therapeutic activities;Patient/family education   OT Goals Acute Rehab OT Goals OT Goal Formulation: With patient Time For Goal Achievement: 09/10/11 Potential to Achieve Goals: Good ADL Goals Pt Will Perform Lower Body Bathing: with supervision;with caregiver independent in assisting;Sit to stand from chair;Unsupported;with adaptive equipment;with cueing (comment type and amount) ADL Goal: Lower Body Bathing - Progress: Goal set today Pt Will Perform Lower Body Dressing: with supervision;with caregiver independent in assisting;Unsupported;with adaptive equipment;with cueing (comment type and amount);Sit to stand from chair ADL Goal: Lower Body Dressing - Progress: Goal set today Pt Will Transfer to Toilet: with modified independence;with DME;3-in-1;Maintaining hip precautions;Ambulation ADL Goal: Toilet Transfer - Progress: Progressing toward goals Pt Will Perform Toileting - Clothing Manipulation: with modified independence;Standing ADL Goal: Toileting - Clothing Manipulation - Progress: Progressing toward goals Pt Will Perform Toileting - Hygiene: with modified independence;Standing at 3-in-1/toilet ADL Goal: Toileting - Hygiene - Progress: Progressing toward goals Pt Will Perform Tub/Shower Transfer: Shower transfer;with  supervision;with caregiver independent in assisting;Ambulation;with DME;Maintaining hip precautions ADL Goal: Tub/Shower Transfer - Progress: Progressing toward goals Additional ADL Goal #1: Pt will state 3.3 hip precautions independently ADL Goal: Additional Goal #1 - Progress: Progressing toward goals  Visit Information  Last OT Received On: 09/03/11 Assistance Needed: +1    Subjective Data      Prior Functioning  Home Living Lives With: Alone Available Help at Discharge:  Friend(s) Type of Home: House Home Access: Stairs to enter [employer] of Steps: 6 Entrance Stairs-Rails: Right Home Layout: One level Bathroom Shower/Tub: Walk-in shower;Tub/shower unit Bathroom Toilet: Standard Bathroom Accessibility: Yes Home Adaptive Equipment: Walker - rolling;Straight cane Additional Comments: plans to D/C to home Prior Function Level of Independence: Independent Able to Take Stairs?: Yes Driving: Yes Vocation: Part time employment Comments: Horticulturist, 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 Dominant Hand: Right    Cognition  Overall Cognitive Status: Appears within functional limits for tasks assessed/performed Arousal/Alertness: Awake/alert Orientation Level: Oriented X4 / Intact Behavior During Session: [facility] for tasks performed    Mobility Bed Mobility Bed Mobility: Supine to Sit;Sitting - Scoot to Edge of Bed Supine to Sit: 4: Min assist Details for Bed Mobility Assistance: assistance for RLE Transfers Transfers: Sit to Stand;Stand to Sit Sit to Stand: 5: Supervision;From bed;With upper extremity assist Stand to Sit: 4: Min guard;Not tested (comment);To chair/3-in-1 Details for Transfer Assistance: Verbal cues for sequencing and hand placement (AND PRECAUTIONS)   Exercises    Balance Balance Balance Assessed:  (wfl)  End of Session OT - End of Session Equipment Utilized During Treatment: Gait belt Activity Tolerance: Patient tolerated treatment  well Patient left: in chair;with call bell/phone within reach;with family/visitor present Nurse Communication: Mobility status  GO     Kailer Heindel,HILLARY 09/03/2011, 4:39 PM [facility], OTR/L  [phone] 09/03/2011

## 2011-09-03 NOTE — Progress Notes (Signed)
Occupational Therapy Evaluation Patient Details Name: Alexis Cole MRN: [id number] DOB: Mar 11, 1945 Today's Date: 09/03/2011 Time: 7829-5621 OT Time Calculation (min): 20 min  OT Assessment / Plan / Recommendation Clinical Impression  66 yo s/p R THA. Pt will benefit from skilled OT  services to max independence with ADl and functional mobility for ADL to facilitate D/C home with 24/7 S of friends. Pt will need 3 in 1. Rec HHOT at this time.    OT Assessment  Patient needs continued OT Services    Follow Up Recommendations  Home health OT    Barriers to Discharge None    Equipment Recommendations  3 in 1 bedside comode    Recommendations for Other Services    Frequency    2x/wk   Precautions / Restrictions Precautions Precautions: Posterior Hip Precaution Booklet Issued: Yes (comment) Precaution Comments: Educated pt on 3/3 hip precautions Required Braces or Orthoses: Knee Immobilizer - Right Restrictions Weight Bearing Restrictions: No RLE Weight Bearing: Weight bearing as tolerated   Pertinent Vitals/Pain 4    ADL  Eating/Feeding: Performed;Independent Where Assessed - Eating/Feeding: Chair Grooming: Performed;Set up Where Assessed - Grooming: Supported sitting Upper Body Bathing: Simulated;Set up Where Assessed - Upper Body Bathing: Supported sitting Lower Body Bathing: Simulated;Maximal assistance Where Assessed - Lower Body Bathing: Supported sit to stand Upper Body Dressing: Simulated;Set up Where Assessed - Upper Body Dressing: Unsupported sitting Lower Body Dressing: Simulated;+1 Total assistance Where Assessed - Lower Body Dressing: Supported sit to stand Toilet Transfer: Mining engineer Method: Sit to Barista: Comfort height toilet Toileting - Clothing Manipulation and Hygiene: Simulated;Minimal assistance Where Assessed - Engineer, mining and Hygiene: Standing Tub/Shower Transfer:  Simulated;Moderate assistance Tub/Shower Transfer Method: Stand pivot;Ambulating Tub/Shower Transfer Equipment: Walk in shower Equipment Used: Gait belt;Rolling walker Transfers/Ambulation Related to ADLs: Min A. vc to follow hip precautions ADL Comments: no knowledge of hip kit or following hip precautions during ADL    OT Diagnosis:    OT Problem List: Decreased strength;Decreased range of motion;Decreased activity tolerance;Decreased knowledge of use of DME or AE;Decreased knowledge of precautions;Pain OT Treatment Interventions: Self-care/ADL training;Energy conservation;DME and/or AE instruction;Therapeutic activities;Patient/family education   OT Goals Acute Rehab OT Goals OT Goal Formulation: With patient Time For Goal Achievement: 09/10/11 Potential to Achieve Goals: Good ADL Goals Pt Will Perform Lower Body Bathing: with supervision;with caregiver independent in assisting;Sit to stand from chair;Unsupported;with adaptive equipment;with cueing (comment type and amount) ADL Goal: Lower Body Bathing - Progress: Goal set today Pt Will Perform Lower Body Dressing: with supervision;with caregiver independent in assisting;Unsupported;with adaptive equipment;with cueing (comment type and amount);Sit to stand from chair ADL Goal: Lower Body Dressing - Progress: Goal set today Pt Will Transfer to Toilet: with modified independence;with DME;3-in-1;Maintaining hip precautions;Ambulation ADL Goal: Toilet Transfer - Progress: Goal set today Pt Will Perform Toileting - Clothing Manipulation: with modified independence;Standing ADL Goal: Toileting - Clothing Manipulation - Progress: Goal set today Pt Will Perform Toileting - Hygiene: with modified independence;Standing at 3-in-1/toilet ADL Goal: Toileting - Hygiene - Progress: Goal set today Pt Will Perform Tub/Shower Transfer: Shower transfer;with supervision;with caregiver independent in assisting;Ambulation;with DME;Maintaining hip  precautions ADL Goal: Tub/Shower Transfer - Progress: Goal set today Additional ADL Goal #1: Pt will state 3.3 hip precautions independently ADL Goal: Additional Goal #1 - Progress: Goal set today  Visit Information  Last OT Received On: 09/03/11 Assistance Needed: +1    Subjective Data   I'm going to stay with my friends  Prior Functioning  Vision/Perception  Home Living Lives With: Alone Available Help at Discharge: Friend(s) Type of Home: House Home Access: Stairs to enter [employer] of Steps: 6 Entrance Stairs-Rails: Right Home Layout: One level Bathroom Shower/Tub: Walk-in shower;Tub/shower unit Bathroom Toilet: Standard Bathroom Accessibility: Yes Home Adaptive Equipment: Walker - rolling;Straight cane Additional Comments: plans to D/C to home Prior Function Level of Independence: Independent Able to Take Stairs?: Yes Driving: Yes Vocation: Part time employment Comments: Horticulturist, 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 Dominant Hand: Right   Vision - Assessment Eye Alignment: Within Functional Limits  Cognition  Overall Cognitive Status: Appears within functional limits for tasks assessed/performed Arousal/Alertness: Awake/alert Orientation Level: Oriented X4 / Intact Behavior During Session: [facility] for tasks performed    Extremity/Trunk Assessment Right Upper Extremity Assessment RUE ROM/Strength/Tone: Within functional levels Left Upper Extremity Assessment LUE ROM/Strength/Tone: Within functional levels Trunk Assessment Trunk Assessment: Normal   Mobility Bed Mobility Bed Mobility: Supine to Sit;Sitting - Scoot to Edge of Bed Supine to Sit: 4: Min assist Details for Bed Mobility Assistance: assistance for RLE Transfers Transfers: Sit to Stand;Stand to Sit Sit to Stand: 5: Supervision;From bed;With upper extremity assist Stand to Sit: 4: Min guard;Not tested (comment);To chair/3-in-1 Details for Transfer Assistance: Verbal cues  for sequencing and hand placement (AND PRECAUTIONS)   Exercise    Balance Balance Balance Assessed:  (wfl)  End of Session OT - End of Session Equipment Utilized During Treatment: Gait belt Activity Tolerance: Patient tolerated treatment well Patient left: in chair;with call bell/phone within reach;with family/visitor present Nurse Communication: Mobility status  GO     Sian Joles,HILLARY 09/03/2011, 4:31 PM [facility], OTR/L  [phone] 09/03/2011

## 2011-09-03 NOTE — Progress Notes (Signed)
Subjective: 2 Days Post-Op Procedure(s) (LRB): TOTAL HIP ARTHROPLASTY (Right)  Activity level:  oob walking Diet tolerance:  eating Voiding:  ok Patient reports pain as 3 on 0-10 scale.    Objective: Vital signs in last 24 hours: Temp:  [99.3 F (37.4 C)-102.2 F (39 C)] 100.2 F (37.9 C) (07/14 0534) Pulse Rate:  [100-108] 100  (07/14 0534) Resp:  [18] 18  (07/14 0534) BP: (116-120)/(64-65) 120/64 mmHg (07/14 0534) SpO2:  [92 %-97 %] 95 % (07/14 0534)  Labs:  Basename 09/02/11 2320 09/02/11 0620  HGB 10.7* 10.7*    Basename 09/02/11 2320 09/02/11 0620  WBC 12.8* 9.7  RBC 3.37* 3.41*  HCT 31.9* 32.1*  PLT 158 159    Basename 09/02/11 2320 09/02/11 0620  NA 131* 133*  K 3.7 3.2*  CL 100 101  CO2 22 24  BUN 7 6  CREATININE 0.57 0.50  GLUCOSE 116* 135*  CALCIUM 9.0 8.9    Basename 09/02/11 2320 09/02/11 0620  LABPT -- --  INR 1.22 1.15    Physical Exam:  Neurologically intact ABD soft Neurovascular intact Sensation intact distally Intact pulses distally Dorsiflexion/Plantar flexion intact No cellulitis present Compartment soft Lungs are clear No u/a symptoms  Assessment/Plan:  2 Days Post-Op Procedure(s) (LRB): TOTAL HIP ARTHROPLASTY (Right) Advance diet Up with therapy Plan for discharge tomorrow Will hold off on chest x-ray today and push incentive spirometry  Change dressing in 1-2 days  Probably ready for d/c Mon    Laiza Veenstra R 09/03/2011, 12:24 PM

## 2011-09-04 ENCOUNTER — Encounter (HOSPITAL_COMMUNITY): Payer: Self-pay | Admitting: Orthopedic Surgery

## 2011-09-04 LAB — PROTIME-INR: INR: 1.43 (ref 0.00–1.49)

## 2011-09-04 LAB — CBC
HCT: 31.8 % — ABNORMAL LOW (ref 36.0–46.0)
MCHC: 33.3 g/dL (ref 30.0–36.0)
Platelets: 169 10*3/uL (ref 150–400)
RDW: 13 % (ref 11.5–15.5)

## 2011-09-04 MED ORDER — WARFARIN SODIUM 5 MG PO TABS: ORAL_TABLET | ORAL | Status: DC

## 2011-09-04 MED ORDER — WARFARIN SODIUM 5 MG PO TABS
5.0000 mg | ORAL_TABLET | Freq: Once | ORAL | Status: DC
  Filled 2011-09-04: qty 1

## 2011-09-04 MED ORDER — METHOCARBAMOL 750 MG PO TABS: 750.0000 mg | ORAL_TABLET | Freq: Three times a day (TID) | ORAL | Status: AC

## 2011-09-04 MED ORDER — OXYCODONE HCL 5 MG PO TABS: 5.0000 mg | ORAL_TABLET | ORAL | Status: AC | PRN

## 2011-09-04 NOTE — Progress Notes (Signed)
Occupational Therapy Treatment Patient Details Name: Alexis Cole MRN: [id number] DOB: Jun 01, 1945 Today's Date: 09/04/2011 Time: 4098-1191 OT Time Calculation (min): 36 min  OT Assessment / Plan / Recommendation Comments on Treatment Session Making steady progress. Pt will continue with HHOT    Follow Up Recommendations  Home health OT    Barriers to Discharge   none    Equipment Recommendations    none   Recommendations for Other Services    Frequency Min 2X/week   Plan Discharge plan remains appropriate    Precautions / Restrictions Precautions Precautions: Posterior Hip Precaution Booklet Issued: Yes (comment) Precaution Comments: able to recall 2/3 precautions Restrictions RLE Weight Bearing: Weight bearing as tolerated   Pertinent Vitals/Pain 3    ADL  Lower Body Dressing: Performed;Minimal assistance Where Assessed - Lower Body Dressing: Unsupported sit to stand Toilet Transfer: Performed;Modified independent Toilet Transfer Method: Sit to Barista: Materials engineer and Hygiene: Performed;Modified independent Where Assessed - Toileting Clothing Manipulation and Hygiene: Standing Tub/Shower Transfer: Landscape architect Method: Science writer: Walk in Scientist, research (physical sciences) Used: Gait belt;Long-handled shoe horn;Long-handled sponge;Reacher;Rolling walker;Sock aid Transfers/Ambulation Related to ADLs: supervision to Mod I ADL Comments: Making progress. Appears to hae difficulty with STM?    OT Diagnosis:    OT Problem List:   OT Treatment Interventions:     OT Goals Acute Rehab OT Goals OT Goal Formulation: With patient Time For Goal Achievement: 09/10/11 Potential to Achieve Goals: Good ADL Goals Pt Will Perform Lower Body Bathing: with supervision;with caregiver independent in assisting;Sit to stand from chair;Unsupported;with adaptive equipment;with  cueing (comment type and amount) ADL Goal: Lower Body Bathing - Progress: Met Pt Will Perform Lower Body Dressing: with supervision;with caregiver independent in assisting;Unsupported;with adaptive equipment;with cueing (comment type and amount);Sit to stand from chair ADL Goal: Lower Body Dressing - Progress: Progressing toward goals Pt Will Transfer to Toilet: with modified independence;with DME;3-in-1;Maintaining hip precautions;Ambulation ADL Goal: Toilet Transfer - Progress: Met Pt Will Perform Toileting - Clothing Manipulation: with modified independence;Standing ADL Goal: Toileting - Clothing Manipulation - Progress: Met Pt Will Perform Toileting - Hygiene: with modified independence;Standing at 3-in-1/toilet ADL Goal: Toileting - Hygiene - Progress: Met Pt Will Perform Tub/Shower Transfer: Shower transfer;with supervision;with caregiver independent in assisting;Ambulation;with DME;Maintaining hip precautions ADL Goal: Tub/Shower Transfer - Progress: Met Additional ADL Goal #1: Pt will state 3.3 hip precautions independently ADL Goal: Additional Goal #1 - Progress: Progressing toward goals  Visit Information  Last OT Received On: 09/04/11 Assistance Needed: +1    Subjective Data      Prior Functioning       Cognition  Overall Cognitive Status: Appears within functional limits for tasks assessed/performed Cognition - Other Comments: Possible STM deficits. Med related??    Mobility Bed Mobility Bed Mobility: Sit to Supine;Scooting to HOB Supine to Sit: 4: Min assist;HOB elevated;With rails (to assist with RLE.) Sit to Supine: 4: Min assist Scooting to [facility]: 7: Independent Details for Bed Mobility Assistance: assist with RLE. Educated and demonstrated how to use belt to assist with leg Transfers Transfers: Sit to Stand;Stand to Sit Sit to Stand: 6: Modified independent (Device/Increase time);From chair/3-in-1 Stand to Sit: With upper extremity assist;To chair/3-in-1;5:  Supervision Details for Transfer Assistance: vc for hand placement when sitting. ? decreased STM?   Exercises    Balance  WFL  End of Session OT - End of Session Equipment Utilized During Treatment: Gait belt Activity Tolerance: Patient tolerated treatment well Patient  left: with call bell/phone within reach;in bed  GO     Daanish Copes,HILLARY 09/04/2011, 1:25 PM [facility], OTR/L  [phone] 09/04/2011

## 2011-09-04 NOTE — Care Management Note (Signed)
    Page 1 of 2   09/04/2011     10:10:03 AM   CARE MANAGEMENT NOTE 09/04/2011  Patient:  Alexis Cole, Alexis Cole   Account Number:  [id number]  Date Initiated:  09/04/2011  Documentation initiated by:  Anette Guarneri  Subjective/Objective Assessment:   POD#3 s/p right THA  needs HH services and DME     Action/Plan:   HHPT/OT/RN  3n1   Anticipated DC Date:  09/04/2011   Anticipated DC Plan:  HOME W HOME HEALTH SERVICES      DC Planning Services  CM consult      PAC Choice  DURABLE MEDICAL EQUIPMENT  HOME HEALTH   Choice offered to / List presented to:  C-1 Patient   DME arranged  3-N-1      DME agency  [facility]     [facility] arranged  HH-1 RN  HH-2 PT  HH-3 OT      [facility] agency  [facility]   Status of service:  Completed, signed off Medicare Important Message given?   (If response is "NO", the following Medicare IM given date fields will be blank) Date Medicare IM given:   Date Additional Medicare IM given:    Discharge Disposition:  HOME W HOME HEALTH SERVICES  Per UR Regulation:  Reviewed for med. necessity/level of care/duration of stay  If discussed at Long Length of Stay Meetings, dates discussed:    Comments:  09/04/11 10:06 Anette Guarneri RN/CM Spoke with patient regarding d/c planning. Per [name], her MD office arranged HH serivces with [facility]. Patient states she has RW at home, needs 3N1. Per [name] she will be staying with a friend who has arranged for hosp. bed to be delivered to her home. Contacted [facility], confirmed HHPT/OT/RN will start on 09/05/11. Contacted [facility], 3n1 to be delivered to room prior to discharge.

## 2011-09-04 NOTE — Progress Notes (Signed)
09/04/11 1018  PT Visit Information  Last PT Received On 09/04/11  Assistance Needed +1  PT Time Calculation  PT Start Time 1018  PT Stop Time 1035  PT Time Calculation (min) 17 min  Precautions  Precautions Posterior Hip  Precaution Booklet Issued Yes (comment)  Precaution Comments Pt able to recall 3/3 precautions   Restrictions  RLE Weight Bearing WBAT  Cognition  Overall Cognitive Status Appears within functional limits for tasks assessed/performed  Bed Mobility  Bed Mobility Supine to Sit;Sitting - Scoot to Edge of Bed  Supine to Sit 5: Supervision;HOB elevated;With rails  Details for Bed Mobility Assistance v/c's to encourage R quad set for independent management of R LE off EOB  Transfers  Transfers Sit to Stand;Stand to Sit  Sit to Stand 5: Supervision;From bed;With upper extremity assist  Stand to Sit 5: Supervision;With armrests;To chair/3-in-1  Ambulation/Gait  Ambulation/Gait Assistance 4: Min guard  Ambulation Distance (Feet) 100 Feet  Assistive device Rolling walker  Ambulation/Gait Assistance Details v/c's to encourage reciprocal gait pattern  Gait Pattern Step-through pattern;Antalgic  General Gait Details decreased R LE wbing  Stairs Yes  Stairs Assistance 4: Min guard  Stair Management Technique One rail Right;Sideways;Step to pattern  Number of Stairs 4   Exercises  Exercises (pt reports doing HEP, added long arc quads )  PT - End of Session  Equipment Utilized During Treatment Gait belt  Activity Tolerance Patient tolerated treatment well  Patient left in chair;with call bell/phone within reach;with nursing in room  Nurse Communication Mobility status  PT - Assessment/Plan  Comments on Treatment Session Pt progressing well towards all goals. Pt demo'd safe transfers and stair negotiation and is safe for d/c home with 24/7 supervision/assist, RW, and HHPT.  PT Plan Discharge plan remains appropriate;Frequency remains appropriate  PT Frequency 7X/week    Follow Up Recommendations Home health PT  Acute Rehab PT Goals  Time For Goal Achievement 09/09/11  Potential to Achieve Goals Good  PT Goal: Supine/Side to Sit - Progress Progressing toward goal  PT Goal: Sit to Stand - Progress Progressing toward goal  PT Goal: Stand to Sit - Progress Progressing toward goal  PT Goal: Ambulate - Progress Progressing toward goal  PT Goal: Up/Down Stairs - Progress Progressing toward goal  PT Goal: Perform Home Exercise Program - Progress Progressing toward goal  PT General Charges  $$ ACUTE PT VISIT 1 Procedure  PT Treatments  $Gait Training 8-22 mins    Pain: 4/10 R hip pain  Lewis Shock, PT, DPT Pager #: [phone] Office #: [phone]

## 2011-09-04 NOTE — Discharge Summary (Signed)
Patient ID: Alexis Cole MRN: [id number] DOB/AGE: 65-15-1947 65 y.o.  Admit date: 09/01/2011 Discharge date: 09/04/2011  Admission Diagnoses:  Principal Problem:  *Osteoarthritis of right hip   Discharge Diagnoses:  Same  Past Medical History  Diagnosis Date  . Headache   . Chronic back pain   . Arthritis   . Depression   . Anxiety     but doesn't take any meds for this  . Sinusitis   . History of bronchitis     last time 6months  . Hx of migraines     last one a month ago  . Joint pain   . Joint swelling   . Chronic back pain   . Acne     but not being treated for  . Gastric ulcer     history of--many yrs ago  . Anemia     history of--many yrs ago  . Insomnia     Nyquil nightly    Surgeries: Procedure(s):Right TOTAL HIP ARTHROPLASTY on 09/01/2011  Discharged Condition: Improved  Hospital Course: NATASA STIGALL is an 66 y.o. female who was admitted 09/01/2011 for operative treatment ofOsteoarthritis of right hip. Patient has severe unremitting pain that affects sleep, daily activities, and work/hobbies. After pre-op clearance the patient was taken to the operating room on 09/01/2011 and underwent  Procedure(s): TOTAL HIP ARTHROPLASTY.    Patient was given perioperative antibiotics: Anti-infectives     Start     Dose/Rate Route Frequency Ordered Stop   09/01/11 1330   ceFAZolin (ANCEF) IVPB 2 g/50 mL premix        2 g 100 mL/hr over 30 Minutes Intravenous Every 6 hours 09/01/11 1138 09/01/11 2101   09/01/11 0617   ceFAZolin (ANCEF) IVPB 2 g/50 mL premix        2 g 100 mL/hr over 30 Minutes Intravenous 60 min pre-op 09/01/11 0619 09/01/11 0740           Patient was given sequential compression devices, early ambulation, and chemoprophylaxis to prevent DVT.  Patient benefited maximally from hospital stay and there were no complications.    Recent vital signs: Patient Vitals for the past 24 hrs:  BP Temp Temp src Pulse Resp SpO2  09/04/11 0601 125/67  mmHg 99.3 F (37.4 C) Oral 80  16  98 %  09/18/11 2147 - 98.4 F (36.9 C) Oral - - -  09/18/2011 1958 122/69 mmHg 99.4 F (37.4 C) Oral 98  16  96 %     Recent laboratory studies:  Basename 09/04/11 0500 2011-09-18 1203 09/02/11 2320 09/02/11 0620  WBC 12.9* -- 12.8* --  HGB 10.6* -- 10.7* --  HCT 31.8* -- 31.9* --  PLT 169 -- 158 --  NA -- -- 131* 133*  K -- -- 3.7 3.2*  CL -- -- 100 101  CO2 -- -- 22 24  BUN -- -- 7 6  CREATININE -- -- 0.57 0.50  GLUCOSE -- -- 116* 135*  INR 1.43 1.37 -- --  CALCIUM -- -- 9.0 --     Discharge Medications:   Medication List  As of 09/04/2011  6:36 PM   STOP taking these medications         HYDROcodone-acetaminophen 5-500 MG per tablet      OVER THE COUNTER MEDICATION         TAKE these medications         cyclobenzaprine 10 MG tablet   Commonly known as: FLEXERIL   Take 1 tablet (  10 mg total) by mouth 3 (three) times daily as needed. For muscle spasm      FISH OIL PO   Take 1 capsule by mouth daily.      methocarbamol 750 MG tablet   Commonly known as: ROBAXIN   Take 1 tablet (750 mg total) by mouth 3 (three) times daily. As needed for spasm.      NYQUIL PO   Take 30 mLs by mouth at bedtime as needed. To help sleep.      oxyCODONE 5 MG immediate release tablet   Commonly known as: Oxy IR/ROXICODONE   Take 1-2 tablets (5-10 mg total) by mouth every 4 (four) hours as needed for pain.      VITAMIN D PO   Take 1 tablet by mouth daily.      warfarin 5 MG tablet   Commonly known as: COUMADIN   Take one daily unless otherwise directed.Shoot for INR of 2.0. Treat x 3 weeks.            Diagnostic Studies: Dg Pelvis Portable  09/01/2011  *RADIOLOGY REPORT*  Clinical Data: 66 year old female status post right hip surgery.  PORTABLE RIGHT HIP - 1 VIEW ,PORTABLE PELVIS  Comparison: Lumbar series 11/22/2011.  Findings: Cross-table lateral: A supine portable view at 1005 hours.  Normal alignment of the right femur and acetabular  hardware.  Overlying skin staples.  Pelvis:  Supine portable view 1002 hours.  Bipolar right hip arthroplasty.  Hardware appears intact and normally aligned.  No unexpected osseous changes.  IMPRESSION: Right hip arthroplasty with no adverse features.  Original Report Authenticated By: Harley Hallmark, M.D.   Dg Hip Portable 1 View Right  09/01/2011  *RADIOLOGY REPORT*  Clinical Data: 66 year old female status post right hip surgery.  PORTABLE RIGHT HIP - 1 VIEW ,PORTABLE PELVIS  Comparison: Lumbar series 11/22/2011.  Findings: Cross-table lateral: A supine portable view at 1005 hours.  Normal alignment of the right femur and acetabular hardware.  Overlying skin staples.  Pelvis:  Supine portable view 1002 hours.  Bipolar right hip arthroplasty.  Hardware appears intact and normally aligned.  No unexpected osseous changes.  IMPRESSION: Right hip arthroplasty with no adverse features.  Original Report Authenticated By: Harley Hallmark, M.D.    Disposition: 01-Home or Self Care  Discharge Orders    Future Orders Please Complete By Expires   Diet general      Call MD / Call 911      Comments:   If you experience chest pain or shortness of breath, CALL 911 and be transported to the hospital emergency room.  If you develope a fever above 101 F, pus (white drainage) or increased drainage or redness at the wound, or calf pain, call your surgeon's office.   Discharge wound care:      Comments:   If you have a hip bandage, keep it clean and dry.  Change your bandage as instructed by your health care providers.  If your bandage has been discontinued, keep your incision clean and dry.  Pat dry after bathing.  DO NOT put lotion or powder on your incision.   Weight bearing as tolerated      Follow the hip precautions as taught in Physical Therapy         Follow-up Information    Follow up with GRAVES,JOHN L, MD. Schedule an appointment as soon as possible for a visit in 2 weeks.   Contact information:    [address] [place]  [place] [address] [phone]           Signed: Matthew Folks 09/04/2011, 6:36 PM

## 2011-09-04 NOTE — Progress Notes (Signed)
Subjective 3 Days Post-Op Procedure(s) (LRB): TOTAL HIP ARTHROPLASTY (Right) Patient reports pain as 3 on 0-10 scale.  This patient was seen and examined at 8 am today.  She is progressing with PT. Taking po/voiding ok.  Objective: Vital signs in last 24 hours: Temp:  [98.4 F (36.9 C)-99.4 F (37.4 C)] 99.3 F (37.4 C) (07/15 0601) Pulse Rate:  [80-98] 80  (07/15 0601) Resp:  [16] 16  (07/15 0601) BP: (122-125)/(67-69) 125/67 mmHg (07/15 0601) SpO2:  [96 %-98 %] 98 % (07/15 0601)  Intake/Output from previous day: 07/14 0701 - 07/15 0700 In: 840 [P.O.:840] Out: -  Intake/Output this shift:     Basename 09/04/11 0500 09/02/11 2320 09/02/11 0620  HGB 10.6* 10.7* 10.7*    Basename 09/04/11 0500 09/02/11 2320  WBC 12.9* 12.8*  RBC 3.40* 3.37*  HCT 31.8* 31.9*  PLT 169 158    Basename 09/02/11 2320 09/02/11 0620  NA 131* 133*  K 3.7 3.2*  CL 100 101  CO2 22 24  BUN 7 6  CREATININE 0.57 0.50  GLUCOSE 116* 135*  CALCIUM 9.0 8.9    Basename 09/04/11 0500 09/03/11 1203  LABPT -- --  INR 1.43 1.37  Right hip exam:  Neurovascular intact Sensation intact distally Intact pulses distally Dorsiflexion/Plantar flexion intact Incision: scant drainage Compartment soft  Assessment/Plan: 3 Days Post-Op Procedure(s) (LRB): TOTAL HIP ARTHROPLASTY (Right) Plan: D/C IV fluids Discharge home with home health  Chloe Flis G 09/04/2011, 6:32 PM

## 2011-09-04 NOTE — Progress Notes (Signed)
ANTICOAGULATION CONSULT NOTE - Follow up Consult  Pharmacy Consult for coumadin Indication: VTE prophylaxis s/p R THA  No Known Allergies  Patient Measurements: Wt=89.5kg  Vital Signs: Temp: 99.3 F (37.4 C) (07/15 0601) Temp src: Oral (07/15 0601) BP: 125/67 mmHg (07/15 0601) Pulse Rate: 80  (07/15 0601)  Labs:  Basename 09/04/11 0500 09/03/11 1203 09/02/11 2320 09/02/11 0620  HGB 10.6* -- 10.7* --  HCT 31.8* -- 31.9* 32.1*  PLT 169 -- 158 159  APTT -- -- -- --  LABPROT 17.7* 17.1* 15.7* --  INR 1.43 1.37 1.22 --  HEPARINUNFRC -- -- -- --  CREATININE -- -- 0.57 0.50  CKTOTAL -- -- -- --  CKMB -- -- -- --  TROPONINI -- -- -- --    Estimated Creatinine Clearance: 80.6 ml/min (by C-G formula based on Cr of 0.57).   Assessment: INR 1.43 today in this 66 yo s/p R THA after two days of coumadin for VTE prophylaxis. Baseline INR noted as 0.96 on 08/28/11. No bleeding reported. H/H stable post-op.   Goal of Therapy:  INR goal: 2.0 per MD (treatment for 3 weeks)  Plan:  -Coumadin 5mg  po today -Daily PT/INR -Patient has been educated.  -For discharge, recommend 5 mg daily with follow-up INR in 2-3 days.   Link Snuffer, PharmD, BCPS Clinical Pharmacist [phone] 09/04/2011 10:11 AM

## 2012-03-20 ENCOUNTER — Telehealth: Payer: Self-pay | Admitting: Family Medicine

## 2012-03-20 NOTE — Telephone Encounter (Signed)
Patient dropped off form to be filled out for insurance company.  Please call her when completed.

## 2012-03-25 NOTE — Telephone Encounter (Signed)
Note given to our coding specialist since is mostly insurance information. Awaiting what portion I should need to fill.

## 2012-12-10 ENCOUNTER — Encounter: Payer: Self-pay | Admitting: Family Medicine

## 2012-12-10 ENCOUNTER — Ambulatory Visit (INDEPENDENT_AMBULATORY_CARE_PROVIDER_SITE_OTHER): Payer: Medicare Other | Admitting: Family Medicine

## 2012-12-10 VITALS — BP 135/87 | HR 87 | Temp 99.1°F | Ht 67.0 in | Wt 194.9 lb

## 2012-12-10 DIAGNOSIS — J40 Bronchitis, not specified as acute or chronic: Secondary | ICD-10-CM

## 2012-12-10 MED ORDER — ALBUTEROL SULFATE HFA 108 (90 BASE) MCG/ACT IN AERS: 2.0000 | INHALATION_SPRAY | Freq: Four times a day (QID) | RESPIRATORY_TRACT | Status: DC | PRN

## 2012-12-10 MED ORDER — DOXYCYCLINE HYCLATE 100 MG PO TABS: 100.0000 mg | ORAL_TABLET | Freq: Two times a day (BID) | ORAL | Status: DC

## 2012-12-10 MED ORDER — GUAIFENESIN 200 MG PO TABS: 400.0000 mg | ORAL_TABLET | ORAL | Status: AC | PRN

## 2012-12-10 MED ORDER — PREDNISONE 20 MG PO TABS: 40.0000 mg | ORAL_TABLET | Freq: Every day | ORAL | Status: DC

## 2012-12-10 NOTE — Progress Notes (Signed)
Family Medicine Office Visit Note   Subjective:   Patient ID: Alexis Cole, female  DOB: August 20, 1945, 67 y.o.. MRN: [id number]   Pt that comes today complaining of cough and respiratory symptoms started 2 weeks ago. Pt is smoker and has PMHx significant for Bronchitis but has never been diagnosed with COPD per pt's report. Her last episode of bronchitis was 20 months ago per our records, although she reports on and off episodes that self resolves at home with symptomatic OTC treatments. This time she denies fever or chills, but her cough has gotten worse now with sputum production. She denies SOB, chest pain, nausea, vomiting or diarrhea. Denies Hx of sick contact.  Review of Systems:  Per HPI  Objective:   Physical Exam: Gen:  NAD HEENT: Moist mucous membranes  CV: Regular rate and rhythm, no murmurs rubs or gallops PULM: no increased WOB, wheezes present throughout both lungs fields. No rales on auscultation noted.  ABD: Soft, non tender, non distended, normal bowel sounds EXT: No edema Neuro: Alert and oriented x3. No focalization  Assessment & Plan:

## 2012-12-10 NOTE — Patient Instructions (Addendum)
Bronchitis Bronchitis is the body's way of reacting to injury and/or infection (inflammation) of the bronchi. Bronchi are the air tubes that extend from the windpipe into the lungs. If the inflammation becomes severe, it may cause shortness of breath. CAUSES  Inflammation may be caused by:  A virus.  Germs (bacteria).  Dust.  Allergens.  Pollutants and many other irritants. The cells lining the bronchial tree are covered with tiny hairs (cilia). These constantly beat upward, away from the lungs, toward the mouth. This keeps the lungs free of pollutants. When these cells become too irritated and are unable to do their job, mucus begins to develop. This causes the characteristic cough of bronchitis. The cough clears the lungs when the cilia are unable to do their job. Without either of these protective mechanisms, the mucus would settle in the lungs. Then you would develop pneumonia. Smoking is a common cause of bronchitis and can contribute to pneumonia. Stopping this habit is the single most important thing you can do to help yourself. TREATMENT   Your caregiver may prescribe an antibiotic if the cough is caused by bacteria. Also, medicines that open up your airways make it easier to breathe. Your caregiver may also recommend or prescribe an expectorant. It will loosen the mucus to be coughed up. Only take over-the-counter or prescription medicines for pain, discomfort, or fever as directed by your caregiver.  Removing whatever causes the problem (smoking, for example) is critical to preventing the problem from getting worse.  Cough suppressants may be prescribed for relief of cough symptoms.  Inhaled medicines may be prescribed to help with symptoms now and to help prevent problems from returning.  For those with recurrent (chronic) bronchitis, there may be a need for steroid medicines. SEEK IMMEDIATE MEDICAL CARE IF:   During treatment, you develop more pus-like mucus (purulent  sputum).  You have a fever.  You become progressively more ill.  You have increased difficulty breathing, wheezing, or shortness of breath. It is necessary to seek immediate medical care if you are elderly or sick from any other disease. MAKE SURE YOU:   Understand these instructions.  Will watch your condition.  Will get help right away if you are not doing well or get worse. Document Released: 02/06/2005 Document Revised: 05/01/2011 Document Reviewed: 12/17/2007 [facility] Patient Information 2014 [place], [place].

## 2012-12-12 NOTE — Assessment & Plan Note (Signed)
At this time will treat with SABA, short course of Prednisone, Abx PO and mucolytic. Since this is her second episode on a smoker pt we recommended once that acuity of this event has passed she will benefit from PFT's. Also she is interested in quitting smoking and information about our Smoking [facility] with Dr. Raymondo Band has been offered. Pt stated she will set a quit date and make appointment to get help. Quit line resources also offered

## 2013-01-03 ENCOUNTER — Encounter: Payer: Self-pay | Admitting: Family Medicine

## 2013-01-03 ENCOUNTER — Ambulatory Visit (INDEPENDENT_AMBULATORY_CARE_PROVIDER_SITE_OTHER): Payer: Medicare Other | Admitting: Family Medicine

## 2013-01-03 VITALS — BP 159/102 | HR 94 | Temp 98.2°F | Ht 67.0 in | Wt 194.0 lb

## 2013-01-03 DIAGNOSIS — Z5189 Encounter for other specified aftercare: Secondary | ICD-10-CM

## 2013-01-03 DIAGNOSIS — M25511 Pain in right shoulder: Secondary | ICD-10-CM | POA: Insufficient documentation

## 2013-01-03 DIAGNOSIS — J302 Other seasonal allergic rhinitis: Secondary | ICD-10-CM

## 2013-01-03 DIAGNOSIS — J309 Allergic rhinitis, unspecified: Secondary | ICD-10-CM

## 2013-01-03 DIAGNOSIS — M25519 Pain in unspecified shoulder: Secondary | ICD-10-CM

## 2013-01-03 DIAGNOSIS — R200 Anesthesia of skin: Secondary | ICD-10-CM

## 2013-01-03 DIAGNOSIS — R209 Unspecified disturbances of skin sensation: Secondary | ICD-10-CM

## 2013-01-03 DIAGNOSIS — Z23 Encounter for immunization: Secondary | ICD-10-CM

## 2013-01-03 DIAGNOSIS — T7840XD Allergy, unspecified, subsequent encounter: Secondary | ICD-10-CM

## 2013-01-03 DIAGNOSIS — L821 Other seborrheic keratosis: Secondary | ICD-10-CM

## 2013-01-03 MED ORDER — DESLORATADINE 5 MG PO TABS: 5.0000 mg | ORAL_TABLET | Freq: Every day | ORAL | Status: DC

## 2013-01-03 MED ORDER — FLUTICASONE PROPIONATE 50 MCG/ACT NA SUSP: 1.0000 | Freq: Every day | NASAL | Status: DC

## 2013-01-03 NOTE — Patient Instructions (Addendum)
It has been a pleasure to see you today. Please take the medications as prescribed. I will call you with the imaging results if they come back abnormal otherwise you will receive a letter. Make your next appointment as needed if you develop a weakness, intractable pain or any other symptoms of concern. Regarding your lesion in your back is called Seborrheic Keratosis and at this time there is no need for treatment. If the lesion become bigger, darker, is painful or redness surround it, we will need to re-evaluate it.

## 2013-01-03 NOTE — Assessment & Plan Note (Signed)
Oral antihistaminic and Flonase.  F/u as needed

## 2013-01-03 NOTE — Assessment & Plan Note (Signed)
Her right arm symptoms can be related to trigger point, but since pt relates starting of the symptoms with abrupt neck turn, imaging may be required to assess condition of cervical spine. Neurologic exam is reassuring though. Reccommended deep massage, heat pad and  muscle relaxants. Discussed signs of worsening condition that need evaluation.  F/u as needed.

## 2013-01-03 NOTE — Progress Notes (Signed)
Family Medicine Office Visit Note   Subjective:   Patient ID: Alexis Cole, female  DOB: 08-27-1945, 67 y.o.. MRN: [id number]   Pt that comes today to f/u bronchitis. She reports feeling better. She denies cough, sputum production, fever, chills, or other systemic symptoms. Her complains today are runny nose and allergies. She has had seasonal allergies for a while and take only OTC medications with partial relieve of her symptoms.  Her other concern today is arm numbness. She works as a Social worker and reports two weeks ago when driving she turned her head abruptly and since then she reports moderated shoulder discomfort that radiated to her R arm. She also has noticed areas around her elbow that are numb and some sensation of needles and pins on her right arm in a odd distribution (ulnar and radial intermittently). She denies focalized weakness or pain.   Review of Systems:  Per HPI  Objective:   Physical Exam: Gen:  NAD HEENT: Moist mucous membranes. Clear rhinorrhea, normal oropharynx.  Neck no adenopathies. Normal ears bilaterally. Normal ROM. No tenderness to palpation. No paraspinal tenderness.  CV: Regular rate and rhythm, no murmurs rubs or gallops PULM: Clear to auscultation bilaterally. No wheezes/rales/rhonchi ABD: Soft, non tender, non distended, normal bowel sounds EXT: No edema. Normal pulses. MSK: punctiform tenderness in upper portion of trapezius muscle of right side.  Neuro: Alert and oriented x3. No focalization. 5/5 strength bilaterally, sensation intact. N+CN II-XII intact. Normal gait and coordination.  Skin: 0.5cm lesion on mid portion of her back stuck-on apperance. No surrounded erythema.  Assessment & Plan:

## 2013-01-03 NOTE — Assessment & Plan Note (Signed)
Non inflamed. Pt reports Maternal positive FHx. No atypical lesion that requires biopsy at this time. Being nature of condition discussed with pt and no treatment is required. Instructed that if changes on size, pigmentation, or other concerns occur she will need re-evaluation.

## 2014-02-25 ENCOUNTER — Telehealth: Payer: Self-pay | Admitting: Family Medicine

## 2014-02-25 NOTE — Telephone Encounter (Signed)
Pt calling to give information on doctors she is seeing. For eye care pt is going to Dr. Katy Fitch, and her Ortho surgeon is Dr. Dorna Leitz and [facility]. Would like RN to call back to confirm MD has this information.

## 2014-02-26 ENCOUNTER — Telehealth: Payer: Self-pay | Admitting: Family Medicine

## 2014-02-26 NOTE — Telephone Encounter (Signed)
I have made note in her chart. Thanks.

## 2014-04-17 ENCOUNTER — Encounter: Payer: Self-pay | Admitting: Family Medicine

## 2014-04-17 ENCOUNTER — Ambulatory Visit (INDEPENDENT_AMBULATORY_CARE_PROVIDER_SITE_OTHER): Payer: Commercial Managed Care - HMO | Admitting: Family Medicine

## 2014-04-17 VITALS — BP 192/100 | HR 78 | Ht 67.0 in | Wt 207.0 lb

## 2014-04-17 DIAGNOSIS — M23207 Derangement of unspecified meniscus due to old tear or injury, left knee: Secondary | ICD-10-CM

## 2014-04-17 DIAGNOSIS — M23209 Derangement of unspecified meniscus due to old tear or injury, unspecified knee: Secondary | ICD-10-CM | POA: Insufficient documentation

## 2014-04-17 DIAGNOSIS — I10 Essential (primary) hypertension: Secondary | ICD-10-CM | POA: Diagnosis not present

## 2014-04-17 DIAGNOSIS — M1710 Unilateral primary osteoarthritis, unspecified knee: Secondary | ICD-10-CM

## 2014-04-17 DIAGNOSIS — M23201 Derangement of unspecified lateral meniscus due to old tear or injury, left knee: Secondary | ICD-10-CM

## 2014-04-17 DIAGNOSIS — F341 Dysthymic disorder: Secondary | ICD-10-CM | POA: Diagnosis not present

## 2014-04-17 DIAGNOSIS — M129 Arthropathy, unspecified: Secondary | ICD-10-CM

## 2014-04-17 DIAGNOSIS — M23204 Derangement of unspecified medial meniscus due to old tear or injury, left knee: Secondary | ICD-10-CM | POA: Diagnosis not present

## 2014-04-17 DIAGNOSIS — M1990 Unspecified osteoarthritis, unspecified site: Secondary | ICD-10-CM

## 2014-04-17 DIAGNOSIS — M62838 Other muscle spasm: Secondary | ICD-10-CM

## 2014-04-17 DIAGNOSIS — Z23 Encounter for immunization: Secondary | ICD-10-CM

## 2014-04-17 DIAGNOSIS — IMO0002 Reserved for concepts with insufficient information to code with codable children: Secondary | ICD-10-CM

## 2014-04-17 MED ORDER — BACLOFEN 10 MG PO TABS: 5.0000 mg | ORAL_TABLET | Freq: Three times a day (TID) | ORAL | Status: DC

## 2014-04-17 MED ORDER — HYDROCHLOROTHIAZIDE 25 MG PO TABS: 25.0000 mg | ORAL_TABLET | Freq: Every day | ORAL | Status: DC

## 2014-04-17 MED ORDER — BUSPIRONE HCL 7.5 MG PO TABS: 7.5000 mg | ORAL_TABLET | Freq: Two times a day (BID) | ORAL | Status: DC

## 2014-04-17 MED ORDER — DICLOFENAC SODIUM 75 MG PO TBEC: 75.0000 mg | DELAYED_RELEASE_TABLET | Freq: Two times a day (BID) | ORAL | Status: DC

## 2014-04-17 NOTE — Progress Notes (Signed)
Subjective:    Patient ID: Alexis Cole, female    DOB: 1945-12-04, 69 y.o.   MRN: [id number]  HPI    Orthopedic: Left chronic meniscal tear: Patient presents to family medicine today for referral to her orthopedic Dr. Berenice Primas. She has seen him in the past for a right total hip arthroplasty in 2013. In addition she has been seen him after she had a fall and suffered from a left knee trauma in which she states imaging studies at Dr. Jackalyn Lombard office state that she is "bone on bone "with meniscal injury. She has been receiving injections every 3 months from Dr. Berenice Primas, but her referral has lapsed and she needs a new referral for him today. She is having difficulty standing long periods of time, which her job requires her to do. She is currently on diclofenac and Robaxin, priorly prescribed her orthopedic. She has been trying to raise the money to have her knee surgery, and has anxiety surrounding the financial restraints. Muscle spasms: Patient states she has lower back problems, that cause her muscles to spasm frequently. She used to take Flexeril, however this is now a higher tear in her insurance program and she is unable to afford it. She was switched to Robaxin, however this has now changed to tear 4 with her insurance. Arthritis: Patient is prescribed diclofenac for her arthritis. She states that it is extremely helpful and last about 4 hours allowing her to at least work. Unfortunately she does work 6 hours shifts, but she is able to make it through, just with increased pain.  Anxiety: Patient has a long-standing history of anxiety. She used to take Xanax, and her prior PCP took her off of them. She states that she had taken Paxil when she was in her 16s, but she attempted suicide while taking Paxil. Her blood pressure is elevated today, and she states it's from her nerves. She is having financial difficulty, along with difficulty with her health care. She reports she is under a lot of stress  because she only makes minimum wage, she is only able to work 6 hours a day due to pain. Her insurance recently switched, and she doesn't feel she has as good coverage in his having out-of-pocket costs. She has been trying to raise the money to have her knee surgery, and has anxiety surrounding the financial restraints. She has a brother that lives in [place], that she is able to talk to, and she states she stays in contact with him weekly. She has a pet dog as well , that gives her joy. She denies any SI/HI.  Hypertension: Patient with elevated blood pressures over the last few office visits. She does have increased anxiety currently as well. She denies any chest pain, palpitations, headaches, blurred vision, lower extremity edema, orthopnea or syncope. She has never taking any blood pressure medication. She currently does not watch her diet. She is unable to exercise due to pain (see above).   Past Medical History  Diagnosis Date  . Headache(784.0)   . Chronic back pain   . Arthritis   . Depression   . Anxiety     but doesn't take any meds for this  . Sinusitis   . History of bronchitis     last time 86months  . Hx of migraines     last one a month ago  . Joint pain   . Joint swelling   . Chronic back pain   . Acne  but not being treated for  . Gastric ulcer     history of--many yrs ago  . Anemia     history of--many yrs ago  . Insomnia     Nyquil nightly   No Known Allergies   Review of Systems Per HPI    Objective:   Physical Exam BP 192/100 mmHg  Pulse 78  Ht 5\' 7"  (1.702 m)  Wt 207 lb (93.895 kg)  BMI 32.41 kg/m2 Gen: NAD. Well developed. Well nourished. Very pleasant.  HEENT: AT. [place]. Bilateral eyes without injections or icterus. MMM.  CV: RRR 1/6 SM appreciated.  Chest: CTAB, no wheeze or crackles Abd: Soft. NTND. BS Present. No Masses palpated.  Ext: No erythema. No edema. TTP over patella, and medial/lateral joint line. Mild effusion present. Decreased  range of motion in flexion and extension. No ligament laxity. Neurovascularly intact distally. Did not stress ROM secondary to pain with exam.  Psych: Normal dress. Anxious. Tearful at times. Normal speech.      Assessment & Plan:

## 2014-04-17 NOTE — Assessment & Plan Note (Signed)
Patient with elevated blood pressure again today during office visit. Prior visits they had thought it was due to anxiety and pain. However with multiple visits with elevated blood pressure, will start HCTZ today and [facility] 24-hour BP monitoring. Secondary to insurance purposes, we will complete lab work in 2-3 weeks at her annual physical appointment.

## 2014-04-17 NOTE — Assessment & Plan Note (Signed)
Have started BuSpar 7.5 mg twice a day. Patient will follow-up in 2-4 weeks, will consider increasing medication at that time if needed. Will get GAD score is well

## 2014-04-17 NOTE — Patient Instructions (Signed)
We have started HCTZ (hydrochlorothiazide) (for you to take one time a day for your blood pressure. You will need to make a pharmacy appointment with Dr. Valentina Lucks to monitor your blood pressure, this has to be scheduled on either a Monday or Thursday morning. Continue to watch the salt in your diet, eating plenty of fresh veggies and baked meats.  I have refilled your diclofenac, and a muscle relaxer called baclofen.  We will start a medication called BuSpar today you will take it 2 times a day. We'll need to see you back in about 2 weeks to discuss how you're feeling with this, we can go up on the dose eventually but we need to taper it up.  Your orthopedic referral has gone through already.  It was a pleasure meeting you today.

## 2014-04-17 NOTE — Assessment & Plan Note (Signed)
Patient suffers from bilateral knee arthritis, has been seeing Dr. Berenice Primas at [facility] for injections. She is attempting to save enough money for knee replacement co-pay. Refill diclofenac today.

## 2014-04-17 NOTE — Assessment & Plan Note (Addendum)
Patient had been prescribed Robaxin and Flexeril in the past, now unaffordable due to teir 4/5 medication. Prescribe baclofen 5 mg 3 times a day when necessary today, can consider increasing dose on next appointment in 2 weeks.

## 2014-04-21 ENCOUNTER — Telehealth: Payer: Self-pay | Admitting: Family Medicine

## 2014-04-21 NOTE — Telephone Encounter (Signed)
Pt has received a silverback approval for her shots is her knee. The issue is they forgot to include the silverback for her shot she gets in her back. Her appointment is tomorrow 04/22/14 at 4:15 pm. Can we get the additional silverback done in time so that she doesn't have to pay 2 co-pays of 45.00 per visit. jw

## 2014-04-23 ENCOUNTER — Ambulatory Visit (INDEPENDENT_AMBULATORY_CARE_PROVIDER_SITE_OTHER): Payer: Commercial Managed Care - HMO | Admitting: Pharmacist

## 2014-04-23 ENCOUNTER — Encounter: Payer: Self-pay | Admitting: Pharmacist

## 2014-04-23 VITALS — BP 180/102 | HR 73 | Ht 66.0 in | Wt 201.6 lb

## 2014-04-23 DIAGNOSIS — M1712 Unilateral primary osteoarthritis, left knee: Secondary | ICD-10-CM | POA: Diagnosis not present

## 2014-04-23 DIAGNOSIS — J302 Other seasonal allergic rhinitis: Secondary | ICD-10-CM | POA: Diagnosis not present

## 2014-04-23 DIAGNOSIS — I10 Essential (primary) hypertension: Secondary | ICD-10-CM

## 2014-04-23 DIAGNOSIS — Z720 Tobacco use: Secondary | ICD-10-CM

## 2014-04-23 DIAGNOSIS — F341 Dysthymic disorder: Secondary | ICD-10-CM | POA: Diagnosis not present

## 2014-04-23 DIAGNOSIS — F17200 Nicotine dependence, unspecified, uncomplicated: Secondary | ICD-10-CM

## 2014-04-23 MED ORDER — AMLODIPINE BESYLATE 5 MG PO TABS: 5.0000 mg | ORAL_TABLET | Freq: Every day | ORAL | Status: DC

## 2014-04-23 MED ORDER — FLUTICASONE PROPIONATE 50 MCG/ACT NA SUSP: 1.0000 | Freq: Two times a day (BID) | NASAL | Status: DC

## 2014-04-23 MED ORDER — LORATADINE 10 MG PO TABS: 10.0000 mg | ORAL_TABLET | Freq: Every day | ORAL | Status: DC

## 2014-04-23 NOTE — Progress Notes (Deleted)
S:    Patient arrives walking without assistance.  She aml***.    She presents to the clinic for ambulatory blood pressure evaluation.  Diagnosed with Hypertension in the year of ***.    Medication compliance is reported to be ***.  Discussed procedure for wearing the monitor and gave patient written instructions. Monitor was placed on non-dominant arm with instructions to return in the morning.   Current BP Medications include:  ***  Antihypertensives tried in the past include: ***  Patient returned to the clinic and reported ***  O:   Last 3 Office BP readings: BP Readings from Last 3 Encounters:  04/23/14 180/102  04/17/14 192/100  01/03/13 159/102     Today's Office BP reading: *** mmHg (manual reading)  ABPM Study Data: Arm Placement {arms; right/left/both:17953:o}   Overall Mean 24hr BP:   *** mmHg HR: ***   Daytime Mean BP:  *** mmHg HR: ***   Nighttime Mean BP:  *** mmHg HR: ***   Dipping Pattern: {yes no:314532}  Sys:   ***   Dia: ***   [normal dipping ~10-20%]   Non-hypertensive ABPM thresholds: daytime BP <135/85 mmHg, sleeptime BP <120/70 mmHg NICE Hypertension Guidelines ([place]) using ABPM: Stage I: >135/85 mmHg, Stage 2: >150/95 mmHg)   BMET    Component Value Date/Time   NA 131* 09/02/2011 2320   K 3.7 09/02/2011 2320   CL 100 09/02/2011 2320   CO2 22 09/02/2011 2320   GLUCOSE 116* 09/02/2011 2320   BUN 7 09/02/2011 2320   CREATININE 0.57 09/02/2011 2320   CREATININE 0.57 08/21/2011 1010   CALCIUM 9.0 09/02/2011 2320   GFRNONAA >90 09/02/2011 2320   GFRAA >90 09/02/2011 2320    A/P: PICK 1 OF THE FOLLOWING 2 (DELETE the other AND THEN DELETE THIS LINE OF TEXT) History of hypertension since *** found to have white coat hypertension (BP at goal)  Given 24-hour ambulatory blood pressure demonstrates *** with an average blood pressure of *** mmHg, and a nocturnal dipping pattern that is {Desc; normal/abnormal w/wildcard:19060}.  Changes to  medications  ***.   History of hypertension since *** found to have isolated systolic hypertension given 24-hour ambulatory blood pressure demonstrates *** with an average blood pressure of *** mmHg, and a nocturnal dipping pattern that is {Desc; normal/abnormal w/wildcard:19060}.  Changes to medications ***.    Results reviewed and written information provided.   F/U Clinic Visit with Dr. Marland Kitchen  Total time in face-to-face counseling *** minutes.  Patient seen with ***

## 2014-04-23 NOTE — Telephone Encounter (Signed)
Referral put in and authorized for back pain injections Outpatient Authorization [id number]

## 2014-04-23 NOTE — Progress Notes (Signed)
S:    Patient arrives walking without assistance - ambulation appears difficult - reports "blowing out her knee" 1 year ago.  She presents to the clinic for ambulatory blood pressure evaluation.  Diagnosed with Hypertension recently (last week) however she reports having elevated blood pressure readings of [id number] systolic 3-4 years ago during stressful time.  She reports a plan to get another steroid knee injection later today 3/3.  Medication compliance is reported to be fair.   Added several OTC medications to her medication list including Nyquil and Afrin.      Discussed procedure for wearing the monitor and gave patient written instructions. Monitor was placed on non-dominant arm with instructions to return in the morning.   Current BP Medications include:  Started HCTZ this AM (was prescribed last week).  Antihypertensives tried in the past include: none.   Patient returned to the clinic and reported not sleeping well due to BP monitor and not taking Nyquil as she normally does for sleep. She also wrote down how she was feeling at each reading during the 24 hour monitoring period. Her BP was labile with the highest reading of 220/130 at which time she reported feeling "agitated". She also reported only sleeping from 1:30am until 5:30am and her Ambulatory BP Report was adjusted to reflect those sleeping times.   She reported that she did take the Amlodipine 5mg  dose yesterday afternoon and took both her Amlodipine and HCTZ this AM and got the steroid shot in her knee on PM of 3/3. She reports a significant decrease in pain.    Patient has history of perennial allergic rhinitis and uses Afrin chronically at this time. She has tried fluticasone and ClaritinD in the past with some success. ClaritinD not preferred at this time d/t uncontrolled HTN.   O:   Last 3 Office BP readings: BP Readings from Last 3 Encounters:  04/23/14 180/102  04/17/14 192/100  01/03/13 159/102     Today's  Office BP reading: 180/102 mmHg (manual reading)  ABPM Study Data: Arm Placement left arm   Overall Mean 24hr BP:   166 mmHg HR: 89   Daytime Mean BP:  170 mmHg HR: 93   Nighttime Mean BP:  147 mmHg HR: 75   Dipping Pattern: Yes.    Sys:   13.5%   Dia: 18.9%   [normal dipping ~10-20%]   Non-hypertensive ABPM thresholds: daytime BP <135/85 mmHg, sleeptime BP <120/70 mmHg NICE Hypertension Guidelines ([place]) using ABPM: Stage I: >135/85 mmHg, Stage 2: >150/95 mmHg)   BMET    Component Value Date/Time   NA 131* 09/02/2011 2320   K 3.7 09/02/2011 2320   CL 100 09/02/2011 2320   CO2 22 09/02/2011 2320   GLUCOSE 116* 09/02/2011 2320   BUN 7 09/02/2011 2320   CREATININE 0.57 09/02/2011 2320   CREATININE 0.57 08/21/2011 1010   CALCIUM 9.0 09/02/2011 2320   GFRNONAA >90 09/02/2011 2320   GFRAA >90 09/02/2011 2320    A/P: History of hypertension for at least several months.  She has multiple reasons for elevated blood pressure including sedentary lifestyle secondary to knee injury (pending knee replacement) - Chronic back pain, and chronic decongestant use.   Given elevated blood pressure of > 180/100 on repeated measures we decided to initiate amlodipine 5mg  daily (first dose this AM).   Patient was advised to take both amlodipine and HCTZ tomorrow AM prior to returning meter.  Patient returned to clinic and ambulatory monitor revealed uncontrolled labile HTN. Patient took  medications as advised and will continue taking Amlodipine 5mg  and HCTZ 25mg  tablets by mouth once daily. Consider increasing amlodipine to 10mg  daily at next visit in 2 weeks with Dr. Raoul Pitch. Patient's BP dips at night so she was advised to continue taking medications in the AM. Consider scheduling return to pharmacy clinic in 1 month to assess BP control and potentially adjust medications.   Longstanding Moderate Tobacco Abuse with long (9 years) periods of abstinence from tobacco.   Tobacco use is 0.5 ppd currently and  she is not ready to quit or cut down at this time.  She is willing to consider an attempt in the near future. Pain and stress are reported barriers for cessation. She also has trouble sleeping and takes Nyquil every night which may be contributing to HTN. Recommend patient stops taking Nyquil and start Nortriptyline 25 mg by mouth every night for sleep/pain/smoking cessation benefits. Patient reports history of suicide attempt on antidepressant 30 years ago however is willing to attempt trial of nortriptyline. Patient advised to contact clinic immediately with intolerance or change in mood.  Chronic perennial allergic rhinitis with daily afrin use.  She does report using claritinD and fluticasone with success in the past.  Willing to retry  fluticasone and loratidine (no deccongestant) yesterday to replace long-term Afrin.  Upon follow up on day two, she reports feeling less congested however continues to have  runny eyes and nose.     Results reviewed and written information provided.   F/U Clinic Visit with Dr. Raoul Pitch on March 15th. .  Total time in face-to-face counseling 45 minutes.  Patient seen with Harland German, PharmD Candidate and Elicia Lamp PharmD Resident.

## 2014-04-23 NOTE — Patient Instructions (Addendum)
Please continue to take the amlodipine 5mg  once daily AND continue the HCTZ once daily.   Start nortriptyline 25mg  each night at bedtime.   Please know this should help with sleep and possibly with pain and assist with reducing your nicotine cravings.

## 2014-04-23 NOTE — Telephone Encounter (Signed)
Pt came in and really needs the referral for shots in her back before her appt this afternoon. She was only approved for the knee, she needs this done before 4:15pm today 04/23/2014 please. Please call and ask for pt at work if this can or cant not be done. [phone]  Thanks SR

## 2014-04-23 NOTE — Assessment & Plan Note (Addendum)
History of hypertension for at least several months.  She has multiple reasons for elevated blood pressure including sedentary lifestyle secondary to knee injury (pending knee replacement) - Chronic back pain, and chronic decongestant use.   Given elevated blood pressure of > 180/100 on repeated measures we decided to initiate amlodipine 5mg  daily (first dose this AM).   Patient was advised to take both amlodipine and HCTZ tomorrow AM prior to returning meter.  Patient returned to clinic and ambulatory monitor revealed uncontrolled labile HTN. Patient took medications as advised and will continue taking Amlodipine 5mg  and HCTZ 25mg  tablets by mouth once daily. Consider increasing amlodipine to 10mg  daily at next visit in 2 weeks with Dr. Raoul Pitch. Patient's BP dips at night so she was advised to continue taking medications in the AM. Consider scheduling return to pharmacy clinic in 1 month to assess BP control and potentially adjust medications.

## 2014-04-24 DIAGNOSIS — F17200 Nicotine dependence, unspecified, uncomplicated: Secondary | ICD-10-CM | POA: Insufficient documentation

## 2014-04-24 MED ORDER — NORTRIPTYLINE HCL 25 MG PO CAPS: 25.0000 mg | ORAL_CAPSULE | Freq: Every day | ORAL | Status: DC

## 2014-04-24 NOTE — Assessment & Plan Note (Signed)
Longstanding Moderate Tobacco Abuse with long (9 years) periods of abstinence from tobacco.   Tobacco use is 0.5 ppd currently and she is not ready to quit or cut down at this time.  She is willing to consider an attempt in the near future. Pain and stress are reported barriers for cessation. She also has trouble sleeping and takes Nyquil every night which may be contributing to HTN. Recommend patient stops taking Nyquil and start Nortriptyline 25 mg by mouth every night for sleep/pain/smoking cessation benefits. Patient reports history of suicide attempt on antidepressant 30 years ago however is willing to attempt trial of nortriptyline. Patient advised to contact clinic immediately with intolerance or change in mood.

## 2014-04-24 NOTE — Assessment & Plan Note (Signed)
Chronic perennial allergic rhinitis with daily afrin use.  She does report using claritinD and fluticasone with success in the past.  Willing to retry fluticasone and loratidine (no deccongestant) yesterday to replace long-term Afrin.  Upon follow up on day two, she reports feeling less congested however continues to have  runny eyes and nose. Reassess next visit.

## 2014-04-27 DIAGNOSIS — M5441 Lumbago with sciatica, right side: Secondary | ICD-10-CM | POA: Diagnosis not present

## 2014-04-27 NOTE — Progress Notes (Signed)
Patient ID: Alexis Cole, female   DOB: 27-Nov-1945, 68 y.o.   MRN: [id number] Reviewed: Agree with Dr. Graylin Shiver documentation and management.

## 2014-05-04 DIAGNOSIS — S161XXA Strain of muscle, fascia and tendon at neck level, initial encounter: Secondary | ICD-10-CM | POA: Diagnosis not present

## 2014-05-07 ENCOUNTER — Ambulatory Visit (INDEPENDENT_AMBULATORY_CARE_PROVIDER_SITE_OTHER): Payer: Commercial Managed Care - HMO | Admitting: Family Medicine

## 2014-05-07 ENCOUNTER — Encounter: Payer: Self-pay | Admitting: Family Medicine

## 2014-05-07 VITALS — BP 161/81 | HR 89 | Temp 98.2°F | Ht 66.0 in | Wt 202.7 lb

## 2014-05-07 DIAGNOSIS — Z789 Other specified health status: Secondary | ICD-10-CM | POA: Insufficient documentation

## 2014-05-07 DIAGNOSIS — F341 Dysthymic disorder: Secondary | ICD-10-CM | POA: Diagnosis not present

## 2014-05-07 DIAGNOSIS — L989 Disorder of the skin and subcutaneous tissue, unspecified: Secondary | ICD-10-CM

## 2014-05-07 DIAGNOSIS — E669 Obesity, unspecified: Secondary | ICD-10-CM | POA: Insufficient documentation

## 2014-05-07 DIAGNOSIS — I10 Essential (primary) hypertension: Secondary | ICD-10-CM | POA: Diagnosis not present

## 2014-05-07 LAB — COMPREHENSIVE METABOLIC PANEL
ALK PHOS: 82 U/L (ref 39–117)
ALT: 22 U/L (ref 0–35)
AST: 17 U/L (ref 0–37)
Albumin: 3.9 g/dL (ref 3.5–5.2)
BUN: 23 mg/dL (ref 6–23)
CO2: 24 mEq/L (ref 19–32)
CREATININE: 0.71 mg/dL (ref 0.50–1.10)
Calcium: 10.4 mg/dL (ref 8.4–10.5)
Chloride: 104 mEq/L (ref 96–112)
Glucose, Bld: 87 mg/dL (ref 70–99)
POTASSIUM: 4 meq/L (ref 3.5–5.3)
Sodium: 137 mEq/L (ref 135–145)
TOTAL PROTEIN: 6.9 g/dL (ref 6.0–8.3)
Total Bilirubin: 0.3 mg/dL (ref 0.2–1.2)

## 2014-05-07 LAB — TSH: TSH: 1.098 u[IU]/mL (ref 0.350–4.500)

## 2014-05-07 LAB — LDL CHOLESTEROL, DIRECT: Direct LDL: 124 mg/dL — ABNORMAL HIGH

## 2014-05-07 LAB — POCT GLYCOSYLATED HEMOGLOBIN (HGB A1C): HEMOGLOBIN A1C: 6.1

## 2014-05-07 MED ORDER — NORTRIPTYLINE HCL 50 MG PO CAPS: 50.0000 mg | ORAL_CAPSULE | Freq: Every day | ORAL | Status: DC

## 2014-05-07 MED ORDER — AMLODIPINE BESYLATE 10 MG PO TABS: 10.0000 mg | ORAL_TABLET | Freq: Every day | ORAL | Status: DC

## 2014-05-07 NOTE — Assessment & Plan Note (Addendum)
Body mass index is 32.73 kg/(m^2). Patient with mild obesity, will check TSH and A1c today. Patient will be called with results once they become available Patient was encouraged to watch her diet more closely, eat more fresh fruits and vegetables, low in carbohydrate and in sugar content. Patient encouraged to attempt to walk at least 150 minutes a week Follow-up as needed

## 2014-05-07 NOTE — Progress Notes (Signed)
Subjective:    Patient ID: Alexis Cole, female    DOB: May 18, 1945, 69 y.o.   MRN: [id number]  HPI   Hypertension: Patient with recent hypertension history. She was started on HCTZ 25 mg, and amlodipine 5 mg. Patient was given a 24-hour BP monitoring with Dr. Valentina Lucks. It was found that she had some labile hypertension. Hypertension is improved on the office visit today, but remains elevated. Patient is watching the salt content in her diet, she has not started exercising as of yet due to chronic joint pain. She denies any chest pain, shortness of breath, dizziness, headaches or change in vision.  Anxiety with depression: Patient with a long-standing history of depression and anxiety. Previously not medicated since attempting suicide in her early 2s on medication. Start BuSpar 7.5 mg twice a day 2-4 weeks ago, along with nortriptyline 25 mg daily at bedtime has proven to be beneficial for the patient. She states that she feels like she is still feeling some anxiety, but the addition of the medications have allowed her to "cap" those feelings long enough to stand back and evaluate the situation and address it more appropriately.  Skin lesion: Patient with 2 skin lesions on her face, one on the bridge of her nose that she said is rough and has been there for multiple years, and the other is on her left mandible angle. She reports that she thinks her father had melanoma, and she has concerns that she has a type of skin cancer. These lesions are not pigmented. She has a history of seborrheic keratosis. She has no personal skin cancer history. She desires a dermatology referral today.  Tobacco abuse: Patient currently is interested in smoking cessation.  Health maintenance: Patient up-to-date with her flu shot. She is overdue for colonoscopy, Pap, mammogram, tetanus, DEXA scan and Zostavax. Patient states she did have a history of one irregular Pap Henrene Pastor long time ago, and possible genital warts. She  states she had one removed, and it never returned. She has not had a hysterectomy.  Current every day smoker  Past Medical History  Diagnosis Date  . Headache(784.0)   . Chronic back pain   . Arthritis   . Depression   . Anxiety     but doesn't take any meds for this  . Sinusitis   . History of bronchitis     last time 43months  . Hx of migraines     last one a month ago  . Joint pain   . Joint swelling   . Chronic back pain   . Acne     but not being treated for  . Gastric ulcer     history of--many yrs ago  . Anemia     history of--many yrs ago  . Insomnia     Nyquil nightly   No Known Allergies   Review of Systems Per history of present illness    Objective:   Physical Exam BP 161/81 mmHg  Pulse 89  Temp(Src) 98.2 F (36.8 C) (Oral)  Ht 5\' 6"  (1.676 m)  Wt 202 lb 11.2 oz (91.944 kg)  BMI 32.73 kg/m2 Gen: NAD. Nontoxic appearance, well-developed, well-nourished, obese, Caucasian female. HEENT: AT. [place]. Bilateral TM visualized and normal in appearance. Bilateral eyes without injections or icterus. MMM. Bilateral Neck: No lymphadenopathy, supple. No thyromegaly. CV: RRR, no murmur appreciated Chest: CTAB, no wheeze or crackles Abd: Soft. Obese. NTND. BS present. No Masses palpated.  Ext: No erythema. No edema. +2/4 PT  Skin: No rashes, purpura or petechiae.  Neuro:  Mild limp. PERLA. EOMi. Alert. Cranial nerves II through XII intact Psych: Normal affect, dress and demeanor. Normal speech. Normal mood. Not anxious..     Assessment & Plan:

## 2014-05-07 NOTE — Patient Instructions (Signed)
It was a pleasure seeing you again. Please schedule your mammogram and colonoscopy as soon as possible. We have increased her nortriptyline to 50 mg daily at bedtime today. We have increased your amlodipine to 10 mg a day, this may cause some lower extremity swelling. I have put in a referral for dermatology for your facial lesions. Please make a follow-up with Dr. Valentina Lucks for your blood pressure in 2-4 weeks. You will then follow-up with me in 4-8 weeks. We will collect lab work today, I will call you with the results once they become available. Please schedule appointment to have your Pap smear completed, we can do this in 4-8 weeks with your other follow-up with Dr. Raoul Pitch

## 2014-05-07 NOTE — Assessment & Plan Note (Signed)
She states she is feeling better with this start BuSpar 7.5 mg twice a day and nortriptyline 25 mg daily at bedtime. Will increase nortriptyline to 50 mg daily at bedtime, we will leave BuSpar as is for now, patient is aware that if she is feeling more symptoms or desires more anxiety control, she can make an appointment to see [place] as we can increase some of these medications for her. Follow-up in 2 months

## 2014-05-07 NOTE — Assessment & Plan Note (Addendum)
Patient has rough lesion, consistent with basal cell-like lesion versus keratosis on the bridge of her nose, and left chin. She desires dermatology referral today. Refer to dermatology of her choice

## 2014-05-07 NOTE — Assessment & Plan Note (Signed)
Patient overdue for colonoscopy and mammogram. Information given her today for scheduling both colonoscopy and mammogram. Patient strongly encouraged to schedule both as soon as possible. Patient is overdue for Pap smear, she will schedule this to follow her next hypertension follow-up in 8 weeks where we will perform a Pap smear at that time. Patient is up-to-date on her flu shot She will need updated tetanus and DEXA scan, do not want to overwhelm her at this time if she is just starting to get her health maintenance in order, will schedule these on her next appointment with me in 8 weeks.

## 2014-05-07 NOTE — Assessment & Plan Note (Signed)
Patient with essential hypertension, start at 5 mg amlodipine in HCTZ 25 mg 2 weeks ago. Patient had 24-hour blood pressure monitoring with Dr. Valentina Lucks completed 2 weeks ago. Since blood pressure is improved today from prior, but still elevated at 161/81. We have increased her amlodipine to 10 mg daily, patient was educated on side effects amlodipine including lower extremity edema. A she is to follow-up with Dr. Valentina Lucks in 2-4 weeks, and then myself 2-4 weeks after Dr. Graylin Shiver appointment.

## 2014-05-08 LAB — CBC WITH DIFFERENTIAL/PLATELET
BASOS PCT: 0 % (ref 0–1)
Basophils Absolute: 0 10*3/uL (ref 0.0–0.1)
Eosinophils Absolute: 0.2 10*3/uL (ref 0.0–0.7)
Eosinophils Relative: 1 % (ref 0–5)
HEMATOCRIT: 40 % (ref 36.0–46.0)
Hemoglobin: 13.6 g/dL (ref 12.0–15.0)
Lymphocytes Relative: 37 % (ref 12–46)
Lymphs Abs: 6.3 10*3/uL — ABNORMAL HIGH (ref 0.7–4.0)
MCH: 31.1 pg (ref 26.0–34.0)
MCHC: 34 g/dL (ref 30.0–36.0)
MCV: 91.3 fL (ref 78.0–100.0)
MPV: 10.6 fL (ref 8.6–12.4)
Monocytes Absolute: 1.4 10*3/uL — ABNORMAL HIGH (ref 0.1–1.0)
Monocytes Relative: 8 % (ref 3–12)
Neutro Abs: 9.2 10*3/uL — ABNORMAL HIGH (ref 1.7–7.7)
Neutrophils Relative %: 54 % (ref 43–77)
Platelets: 263 10*3/uL (ref 150–400)
RBC: 4.38 MIL/uL (ref 3.87–5.11)
RDW: 14 % (ref 11.5–15.5)
WBC: 17 10*3/uL — ABNORMAL HIGH (ref 4.0–10.5)

## 2014-05-08 LAB — PATHOLOGIST SMEAR REVIEW

## 2014-05-11 ENCOUNTER — Telehealth: Payer: Self-pay | Admitting: Family Medicine

## 2014-05-11 NOTE — Telephone Encounter (Signed)
I called patient earlier, advised that Dr. Amy Martinique at [facility] has no openings for NP until December 2016. I will look to see if I can find her another female dermatologist.

## 2014-05-11 NOTE — Telephone Encounter (Signed)
Pt called because she understood that no female doctors are available for dermatologist she will take anyone. jw

## 2014-05-13 ENCOUNTER — Telehealth: Payer: Self-pay | Admitting: Family Medicine

## 2014-05-13 DIAGNOSIS — D72829 Elevated white blood cell count, unspecified: Secondary | ICD-10-CM

## 2014-05-13 NOTE — Telephone Encounter (Signed)
Pt called back and I informed her of below and she stated that she has to come see Dr. Raoul Pitch in about one month and asked if she could do those labs at the same time and I told her as long as it was at the one month time frame that would be fine.  She understood. Katharina Caper, Chaston Bradburn D

## 2014-05-13 NOTE — Telephone Encounter (Signed)
Please call patient, I attempted to call her and received her voicemail. I left a message that we would attempt to call her back. If She calls in please give her her lab work results by reading this message.   All of her lab work looked normal, with the exception of some of the variables of her white blood cell count, which were elevated. This was sent to the pathologist to review, he stated that it is likely from a "reactive process ". This could be reflective of her recent injections she received from her orthopedic office. This is not a big concern, and she should not be worried about it. However we do have to repeat her blood work in about 1 month, I need her to have this blood work completed before she receives any other injections, in about one month. I have placed the order in epic for future, she just needs to make the lab appointment for it. Thank you

## 2014-05-18 DIAGNOSIS — S161XXD Strain of muscle, fascia and tendon at neck level, subsequent encounter: Secondary | ICD-10-CM | POA: Diagnosis not present

## 2014-05-18 DIAGNOSIS — M545 Low back pain: Secondary | ICD-10-CM | POA: Diagnosis not present

## 2014-05-28 ENCOUNTER — Encounter: Payer: Self-pay | Admitting: Pharmacist

## 2014-05-28 ENCOUNTER — Ambulatory Visit (INDEPENDENT_AMBULATORY_CARE_PROVIDER_SITE_OTHER): Payer: Commercial Managed Care - HMO | Admitting: Pharmacist

## 2014-05-28 VITALS — BP 136/75 | HR 84 | Ht 65.5 in | Wt 190.0 lb

## 2014-05-28 DIAGNOSIS — I10 Essential (primary) hypertension: Secondary | ICD-10-CM | POA: Diagnosis not present

## 2014-05-28 DIAGNOSIS — M1710 Unilateral primary osteoarthritis, unspecified knee: Secondary | ICD-10-CM

## 2014-05-28 DIAGNOSIS — M129 Arthropathy, unspecified: Secondary | ICD-10-CM | POA: Diagnosis not present

## 2014-05-28 DIAGNOSIS — D72829 Elevated white blood cell count, unspecified: Secondary | ICD-10-CM

## 2014-05-28 DIAGNOSIS — IMO0002 Reserved for concepts with insufficient information to code with codable children: Principal | ICD-10-CM

## 2014-05-28 DIAGNOSIS — Z720 Tobacco use: Secondary | ICD-10-CM

## 2014-05-28 DIAGNOSIS — J302 Other seasonal allergic rhinitis: Secondary | ICD-10-CM

## 2014-05-28 DIAGNOSIS — F17200 Nicotine dependence, unspecified, uncomplicated: Secondary | ICD-10-CM

## 2014-05-28 LAB — CBC WITH DIFFERENTIAL/PLATELET
BASOS ABS: 0.1 10*3/uL (ref 0.0–0.1)
Basophils Relative: 1 % (ref 0–1)
EOS ABS: 0.2 10*3/uL (ref 0.0–0.7)
EOS PCT: 2 % (ref 0–5)
HCT: 41.6 % (ref 36.0–46.0)
HEMOGLOBIN: 14 g/dL (ref 12.0–15.0)
Lymphocytes Relative: 44 % (ref 12–46)
Lymphs Abs: 3.7 10*3/uL (ref 0.7–4.0)
MCH: 30.4 pg (ref 26.0–34.0)
MCHC: 33.7 g/dL (ref 30.0–36.0)
MCV: 90.2 fL (ref 78.0–100.0)
MPV: 10.9 fL (ref 8.6–12.4)
Monocytes Absolute: 0.9 10*3/uL (ref 0.1–1.0)
Monocytes Relative: 10 % (ref 3–12)
NEUTROS PCT: 43 % (ref 43–77)
Neutro Abs: 3.7 10*3/uL (ref 1.7–7.7)
PLATELETS: 285 10*3/uL (ref 150–400)
RBC: 4.61 MIL/uL (ref 3.87–5.11)
RDW: 13.7 % (ref 11.5–15.5)
WBC: 8.5 10*3/uL (ref 4.0–10.5)

## 2014-05-28 NOTE — Progress Notes (Signed)
S:    Patient arrives in good spirits, ambulating without assistance.     She presents to the clinic for blood pressure follow up and medication review.   Medication compliance is reported to be excellent.  Reports NO Longer taking AFRIN nasal decongestant OR any night time decongestant combination agent which she previously used frequently.    She verbalized that she was happy to be no longer taking any decongestant.   She reports the use of fluticasone nasal spray, loratadine and nasal saline were effectively controlling her sinus congestion.   Denies any reports of dizziness or light-headedness.  Denies swelling in lower legs.   Denies use of aspirin.   Has remote history of gastric irritation with coughing up blood when she used excessive Excedrin Migraine.   O:   Last 3 Office BP readings: BP Readings from Last 3 Encounters:  05/28/14 136/75  05/07/14 161/81  04/23/14 180/102   PE:   No edema in lower extremities bilaterally.   BMET    Component Value Date/Time   NA 137 05/07/2014 1430   K 4.0 05/07/2014 1430   CL 104 05/07/2014 1430   CO2 24 05/07/2014 1430   GLUCOSE 87 05/07/2014 1430   BUN 23 05/07/2014 1430   CREATININE 0.71 05/07/2014 1430   CREATININE 0.57 09/02/2011 2320   CALCIUM 10.4 05/07/2014 1430   GFRNONAA >90 09/02/2011 2320   GFRAA >90 09/02/2011 2320    A/P: History of hypertension with significantly improved control of her blood pressure.  Changes to medications including the stopping of her past decongestants appear effective in attaining goal blood pressure.  No adverse effects from medication notes.  Patient willing to continue current Blood Pressure medication regimen. Weight loss was also noted.  Reviewed that if she is able to continue with her weight loss plan she may be able to decrease doses of current BP regimen in the future.   Moderate tobacco abuse history in this patient who is working on tobacco reduction/cessation plan.   She reports no longer  smoking in the car and is making her house off limits to tobacco in the near future.  She is interested in cutting down on the number of cigarettes sometime in the future but not currently.   Encouraged her efforts, offered medication support options.  She is not interested anything other than the nortriptyline at this time.  Continued 50mg  of nortriptyline today.  May consider a dose increase to 75mg  at next visit with Dr. Dellie Burns.   Knee pain appears controlled at a fair level of pain control with the use of diclofenac 75mg  twice daily AND steroid shot 1 month ago.    May consider use of PPI as gastro-prophylaxis if patient continues long-term diclofenac.  Use of acetaminophen may be an option to consider as add on therapy as well.   CV reduction - considered use of low dose enteric coated aspirin a few days per week.   If she tolerated this we could consider use of it daily.     History of GI bleed as discussed (appeared her GI bleeding occurred when she was taking high doses of over-the-counter "Excedrin Migraine") and noted.   Results reviewed and written information provided.   F/U Clinic Visit with Dr. Raoul Pitch in 2-4 weeks.  Total time in face-to-face counseling 25 minutes.  Patient seen with Eunice Blase, PharmD Candidate.

## 2014-05-28 NOTE — Patient Instructions (Addendum)
Great to see you today!  Please try low dose (81mg ) "baby" enteric coated aspirin - try 2-3 days per week and then we can increase dose to daily if you can tolerate.   Plan for tobacco is to make the house "off- limits" to smoking!  Next visit - with Dr. Raoul Pitch in 2-4 weeks.

## 2014-05-28 NOTE — Assessment & Plan Note (Signed)
History of hypertension with significantly improved control of her blood pressure.  Changes to medications including the stopping of her past decongestants appear effective in attaining goal blood pressure.  No adverse effects from medication notes.  Patient willing to continue current Blood Pressure medication regimen. Weight loss was also noted.  Reviewed that if she is able to continue with her weight loss plan she may be able to decrease doses of current BP regimen in the future. CV reduction - considered use of low dose enteric coated aspirin a few days per week.   If she tolerated this we could consider use of it daily.     History of GI bleed as discussed (appeared her GI bleeding occurred when she was taking high doses of over-the-counter "Excedrin Migraine") and noted.

## 2014-05-28 NOTE — Assessment & Plan Note (Signed)
Moderate tobacco abuse history in this patient who is working on tobacco reduction/cessation plan.   She reports no longer smoking in the car and is making her house off limits to tobacco in the near future.  She is interested in cutting down on the number of cigarettes sometime in the future but not currently.   Encouraged her efforts, offered medication support options.  She is not interested anything other than the nortriptyline at this time.  Continued 50mg  of nortriptyline today.  May consider a dose increase to 75mg  at next visit with Dr. Dellie Burns.

## 2014-05-28 NOTE — Assessment & Plan Note (Signed)
Knee pain appears controlled at a fair level of pain control with the use of diclofenac 75mg  twice daily AND steroid shot 1 month ago.    May consider use of PPI as gastro-prophylaxis if patient continues long-term diclofenac.  Use of acetaminophen may be an option to consider as add on therapy as well.

## 2014-06-01 ENCOUNTER — Telehealth: Payer: Self-pay | Admitting: Family Medicine

## 2014-06-01 NOTE — Telephone Encounter (Signed)
LVM for pt to call back and inform her of below.  Katharina Caper, Carlea Badour D

## 2014-06-01 NOTE — Telephone Encounter (Signed)
Please call patient, her repeat labs are normal. No need for further lab work this time. Thanks

## 2014-06-02 NOTE — Telephone Encounter (Signed)
Spoke with patient and informed her of below. She stated that she called this morning and information had been given. No documentation of this call or of who gave information to patient.

## 2014-06-03 NOTE — Progress Notes (Signed)
Patient ID: Alexis Cole, female   DOB: 1946/01/26, 69 y.o.   MRN: [id number] Reviewed: Agree with Dr. Graylin Shiver documentation and management.

## 2014-06-04 ENCOUNTER — Ambulatory Visit: Payer: Self-pay | Admitting: Family Medicine

## 2014-06-24 ENCOUNTER — Encounter: Payer: Self-pay | Admitting: Family Medicine

## 2014-06-24 ENCOUNTER — Ambulatory Visit (INDEPENDENT_AMBULATORY_CARE_PROVIDER_SITE_OTHER): Payer: Commercial Managed Care - HMO | Admitting: Family Medicine

## 2014-06-24 ENCOUNTER — Other Ambulatory Visit (HOSPITAL_COMMUNITY)
Admission: RE | Admit: 2014-06-24 | Discharge: 2014-06-24 | Disposition: A | Discharge status: Home / Self Care | Payer: Commercial Managed Care - HMO | Source: Ambulatory Visit | Attending: Family Medicine | Admitting: Family Medicine

## 2014-06-24 VITALS — BP 122/72 | HR 86 | Temp 98.4°F | Ht 66.0 in | Wt 194.0 lb

## 2014-06-24 DIAGNOSIS — F341 Dysthymic disorder: Secondary | ICD-10-CM | POA: Diagnosis not present

## 2014-06-24 DIAGNOSIS — Z124 Encounter for screening for malignant neoplasm of cervix: Secondary | ICD-10-CM | POA: Diagnosis not present

## 2014-06-24 DIAGNOSIS — I10 Essential (primary) hypertension: Secondary | ICD-10-CM

## 2014-06-24 DIAGNOSIS — Z1151 Encounter for screening for human papillomavirus (HPV): Secondary | ICD-10-CM | POA: Diagnosis not present

## 2014-06-24 DIAGNOSIS — M1710 Unilateral primary osteoarthritis, unspecified knee: Secondary | ICD-10-CM

## 2014-06-24 DIAGNOSIS — F17200 Nicotine dependence, unspecified, uncomplicated: Secondary | ICD-10-CM

## 2014-06-24 DIAGNOSIS — IMO0002 Reserved for concepts with insufficient information to code with codable children: Principal | ICD-10-CM

## 2014-06-24 DIAGNOSIS — M129 Arthropathy, unspecified: Secondary | ICD-10-CM | POA: Diagnosis not present

## 2014-06-24 DIAGNOSIS — Z720 Tobacco use: Secondary | ICD-10-CM

## 2014-06-24 DIAGNOSIS — Z0000 Encounter for general adult medical examination without abnormal findings: Secondary | ICD-10-CM | POA: Insufficient documentation

## 2014-06-24 MED ORDER — DICLOFENAC SODIUM 75 MG PO TBEC: 75.0000 mg | DELAYED_RELEASE_TABLET | Freq: Two times a day (BID) | ORAL | Status: DC

## 2014-06-24 NOTE — Patient Instructions (Signed)
Your blood pressure is perfect today, I would not change any of your medications a keep taking your medications daily. Continue to watch her diet, eating a low-salt diet. When you're ready to quit smoking, if we can be of assistance please call we would love to help you with your smoking cessation. I will call you with your Pap smear results once they become available.

## 2014-06-24 NOTE — Assessment & Plan Note (Signed)
Patient's blood pressure is at goal today, and looks great. Congratulated patient on working hard to get her blood pressure down and becoming more healthy. Patient also has lost 13 pounds since we first saw her in February. Follow-up in 3-4 months

## 2014-06-24 NOTE — Assessment & Plan Note (Signed)
Patient declines further smoking cessation today. She states she did attempt to quit a few days ago, but just could not do it. She will contact us again when she is ready to try.

## 2014-06-24 NOTE — Assessment & Plan Note (Signed)
Patient feels much better, and in more control. Continue current regimen.

## 2014-06-24 NOTE — Progress Notes (Signed)
   Subjective:    Patient ID: Alexis Cole, female    DOB: [birth year], 69 y.o.   MRN: [id number]  HPI  Hypertension: She presents to family medicine for hypertension follow-up. She has been seen by Dr. Valentina Lucks in pharmacy clinic, as well as myself has been started on medications. Blood pressure goal <150/90. She reports compliance with medications, she is currently on amlodipine 10 mg daily, hydrochlorothiazide 25 mg daily. She is exercising what she can, she is having difficulty with some of this considering she has arthritis, and left knee pain. She has seen orthopedics for knee pain. She has been watching her diet closely, and has lost 13 pounds since February. She denies any dizziness, chest pain, shortness of breath, palpitations or visual changes.  Anxiety: Patient reports her anxiety is much better now that she has started medications. She reports compliance with BuSpar and nortriptyline. She feels more in control.  Cervical cancer screening/well woman: Patient has not had a Pap smear in 20-25 years. She recalls being told she was HPV positive, but she is uncertain when this occurred. She is postmenopausal, with no vaginal bleeding. She denies any vaginal discharge or irritation. She is in the process of scheduling her mammogram and colonoscopy.  Current every day smoker/tobacco abuse >> declined smoking cessation at this time.  Past Medical History  Diagnosis Date  . Headache(784.0)   . Chronic back pain   . Arthritis   . Depression   . Anxiety     but doesn't take any meds for this  . Sinusitis   . History of bronchitis     last time 42months  . Hx of migraines     last one a month ago  . Joint pain   . Joint swelling   . Chronic back pain   . Acne     but not being treated for  . Gastric ulcer     history of--many yrs ago  . Anemia     history of--many yrs ago  . Insomnia     Nyquil nightly   No Known Allergies   Review of Systems Per hPI    Objective:   Physical  Exam BP 122/72 mmHg  Pulse 86  Temp(Src) 98.4 F (36.9 C) (Oral)  Ht 5\' 6"  (1.676 m)  Wt 194 lb (87.998 kg)  BMI 31.33 kg/m2 Gen: NAD. Nontoxic appearance, well-developed, well-nourished, Caucasian female, smells of smoke, very pleasant, mildly obese. Abd: Soft. Round. NTND. BS present. No Masses palpated.  Ext: No erythema. No  edema.   GYN:  External genitalia within normal limits.  Vaginal mucosa pink, moist, normal rugae for age.  Nonfriable cervix without lesions, no discharge or bleeding noted on speculum exam.  Stenotic cervical os. Bimanual exam revealed normal, nongravid uterus.  No cervical motion tenderness. No adnexal masses bilaterally.        Assessment & Plan:

## 2014-06-24 NOTE — Assessment & Plan Note (Signed)
Pap smear collected today, will call patient with results once they become available. Her cervix was without lesions, however the cervical os was pretty stenotic making it difficult to get a good sample. Patient does have a positive history of HPV in the past. She has not had a Pap smear in 20-25 years. If unable to get good specimen, may need to send to gynecology to obtain Pap smear.

## 2014-06-25 LAB — CYTOLOGY - PAP

## 2014-06-26 ENCOUNTER — Telehealth: Payer: Self-pay | Admitting: Family Medicine

## 2014-06-26 ENCOUNTER — Encounter: Payer: Self-pay | Admitting: *Deleted

## 2014-06-26 NOTE — Telephone Encounter (Signed)
Attempted to call pt, left a message for her to return the call. Please attempt to call her back later today if possible. Her PAP was negative for both malignancy and HPV high risk (cancer causing strains). Her exam was not concerning either, therefore I would recommend that she no longer needs PAP smears completed, unless she is having problems/bleeding. Thanks.

## 2014-06-26 NOTE — Telephone Encounter (Signed)
Mailed letter. Alexis Cole Dawn  

## 2014-07-01 ENCOUNTER — Telehealth: Payer: Self-pay | Admitting: Family Medicine

## 2014-07-01 DIAGNOSIS — L700 Acne vulgaris: Secondary | ICD-10-CM | POA: Diagnosis not present

## 2014-07-01 DIAGNOSIS — L570 Actinic keratosis: Secondary | ICD-10-CM | POA: Diagnosis not present

## 2014-07-01 NOTE — Telephone Encounter (Signed)
Pt called to speak to Dr. Raoul Pitch about her toe and foot being swollen. She said that her BP medication was increased and she doesn't know if this is one of the side effects. She also said it is painful. She can be reached today from 1 pm to 7 pm at [phone]. Blima Rich

## 2014-07-01 NOTE — Telephone Encounter (Signed)
Patient with recent phone call stating she had painful foot, with swelling. It appears this has happened acutely, she has no history of gout but she does have history of arthritis. Patient is on HCTZ and amlodipine. She is asking if this is a possible side effect to her medications. Considering it is painful, this could be a gout flare and this is sometimes potentiated by hydrochlorothiazide use. Patient should be encouraged to come in for same-day appointment on order to be evaluated. She can stop her HCTZ temporarily until she is able to be seen, but she should continue to monitor her blood pressures. Thank you

## 2014-07-02 NOTE — Telephone Encounter (Signed)
Returned call to patient regarding pain and swelling in her foot.  Pt stated it is not painful just swelling during the day while she is at work.  She elevate her feet at night, the swelling goes down a some.  Pt denies any redness to foot.  Per patient "foot looks like a big fluffy cat".  Pt wanted to wait a week just monitor the swelling, advised her to call clinic back sometime today for a same day appt tomorrow if foot continues to swell.  No appt today for same day due to clinic closed this afternoon.  If foot is painful, warm/hot to touch or develops fever to call clinic ASAP or go to urgent care or ED.  Pt stated understanding.  Derl Barrow, RN

## 2014-07-03 ENCOUNTER — Telehealth: Payer: Self-pay | Admitting: Family Medicine

## 2014-07-03 NOTE — Telephone Encounter (Signed)
Was told at last visit that if she had swelling from toe to ankle on left leg, just the ankle on the right le She has swelling at work.  It goes down at night Please advise

## 2014-07-06 NOTE — Telephone Encounter (Signed)
LM for patient to call back.  Please schedule her an appt this week.  Per last telephone note she was advised to make an appt to be seen for this if it continues.  Patient should be seen before 07/21/2014. Jazmin Hartsell,CMA

## 2014-07-13 ENCOUNTER — Ambulatory Visit (INDEPENDENT_AMBULATORY_CARE_PROVIDER_SITE_OTHER): Payer: Commercial Managed Care - HMO | Admitting: Family Medicine

## 2014-07-13 ENCOUNTER — Encounter: Payer: Self-pay | Admitting: Family Medicine

## 2014-07-13 VITALS — BP 150/77 | HR 89 | Temp 97.9°F | Ht 66.0 in | Wt 197.0 lb

## 2014-07-13 DIAGNOSIS — F341 Dysthymic disorder: Secondary | ICD-10-CM

## 2014-07-13 DIAGNOSIS — M1710 Unilateral primary osteoarthritis, unspecified knee: Secondary | ICD-10-CM

## 2014-07-13 DIAGNOSIS — IMO0002 Reserved for concepts with insufficient information to code with codable children: Secondary | ICD-10-CM

## 2014-07-13 DIAGNOSIS — M129 Arthropathy, unspecified: Secondary | ICD-10-CM

## 2014-07-13 DIAGNOSIS — R600 Localized edema: Secondary | ICD-10-CM | POA: Diagnosis not present

## 2014-07-13 DIAGNOSIS — I10 Essential (primary) hypertension: Secondary | ICD-10-CM | POA: Diagnosis not present

## 2014-07-13 MED ORDER — LISINOPRIL 20 MG PO TABS: 20.0000 mg | ORAL_TABLET | Freq: Every day | ORAL | Status: DC

## 2014-07-13 MED ORDER — DICLOFENAC SODIUM 75 MG PO TBEC: 75.0000 mg | DELAYED_RELEASE_TABLET | Freq: Two times a day (BID) | ORAL | Status: DC

## 2014-07-13 MED ORDER — BUSPIRONE HCL 7.5 MG PO TABS: 7.5000 mg | ORAL_TABLET | Freq: Two times a day (BID) | ORAL | Status: DC

## 2014-07-13 NOTE — Progress Notes (Signed)
Patient ID: Alexis Cole, female   DOB: [birth year], 69 y.o.   MRN: [id number]   HPI  Patient presents today for same-day appointment for leg edema patient explains over the last 2 months or so she's had worsening edema. She states that she has left greater than right foot and ankle edema.  She believes that the edema worsened after increasing her dose of amlodipine. She also stubbed her L great toe 1 month ago which she feels might be contributing to her left foot edema.   Her edema has worsened in the day. She states that now she's been off for 2 full days and that it's as good as it gets. It's better by morning in general. She denies history of blood clot or erythema of the lower extremity that is affected. She notes also mild edema of the other leg.  PMH - Smoking status noted ROS: Per HPI  Objective: BP 150/77 mmHg  Pulse 89  Temp(Src) 97.9 F (36.6 C) (Oral)  Ht 5\' 6"  (1.676 m)  Wt 197 lb (89.359 kg)  BMI 31.81 kg/m2 Gen: NAD, alert, cooperative with exam HEENT: NCAT Ext: no edema, LLE calf 38 cm, R LE calf 37.5 cm Neuro: Alert and oriented, No gross deficits  Assessment and plan:  High blood pressure Reasonable today Stopping amlodipine for edema Lisinopril, repeat labs in 1 month    Leg edema, left Left greater than right leg edema Worsened after starting amlodipine, discontinue amlodipine Starting lisinopril, discussed compression hoses and venous insufficiency which might be the next step Follow-up with PCP in one month      Meds ordered this encounter  Medications  . lisinopril (PRINIVIL,ZESTRIL) 20 MG tablet    Sig: Take 1 tablet (20 mg total) by mouth daily.    Dispense:  30 tablet    Refill:  3  . diclofenac (VOLTAREN) 75 MG EC tablet    Sig: Take 1 tablet (75 mg total) by mouth 2 (two) times daily.    Dispense:  60 tablet    Refill:  3    Please fill 60 pills, patient requesting  . busPIRone (BUSPAR) 7.5 MG tablet    Sig: Take 1 tablet (7.5 mg  total) by mouth 2 (two) times daily.    Dispense:  60 tablet    Refill:  3

## 2014-07-13 NOTE — Assessment & Plan Note (Signed)
Left greater than right leg edema Worsened after starting amlodipine, discontinue amlodipine Starting lisinopril, discussed compression hoses and venous insufficiency which might be the next step Follow-up with PCP in one month

## 2014-07-13 NOTE — Patient Instructions (Signed)
Great to meet you!  Stop amlodipine today Start lisinopril tomorrow  We will need to get labs again in 1 month

## 2014-07-13 NOTE — Assessment & Plan Note (Signed)
Reasonable today Stopping amlodipine for edema Lisinopril, repeat labs in 1 month

## 2014-07-21 ENCOUNTER — Ambulatory Visit: Payer: Self-pay | Admitting: Family Medicine

## 2014-08-04 DIAGNOSIS — M5137 Other intervertebral disc degeneration, lumbosacral region: Secondary | ICD-10-CM | POA: Diagnosis not present

## 2014-08-04 DIAGNOSIS — M1712 Unilateral primary osteoarthritis, left knee: Secondary | ICD-10-CM | POA: Diagnosis not present

## 2014-08-09 ENCOUNTER — Other Ambulatory Visit: Payer: Self-pay | Admitting: Family Medicine

## 2014-08-13 ENCOUNTER — Other Ambulatory Visit: Payer: Self-pay | Admitting: Family Medicine

## 2014-08-13 ENCOUNTER — Encounter: Payer: Self-pay | Admitting: Family Medicine

## 2014-08-13 ENCOUNTER — Ambulatory Visit (INDEPENDENT_AMBULATORY_CARE_PROVIDER_SITE_OTHER): Payer: Commercial Managed Care - HMO | Admitting: Family Medicine

## 2014-08-13 VITALS — BP 142/76 | HR 87 | Temp 98.1°F | Resp 16 | Wt 194.0 lb

## 2014-08-13 DIAGNOSIS — M1990 Unspecified osteoarthritis, unspecified site: Secondary | ICD-10-CM | POA: Diagnosis not present

## 2014-08-13 DIAGNOSIS — IMO0002 Reserved for concepts with insufficient information to code with codable children: Principal | ICD-10-CM

## 2014-08-13 DIAGNOSIS — Z23 Encounter for immunization: Secondary | ICD-10-CM

## 2014-08-13 DIAGNOSIS — Z1231 Encounter for screening mammogram for malignant neoplasm of breast: Secondary | ICD-10-CM

## 2014-08-13 DIAGNOSIS — E2839 Other primary ovarian failure: Secondary | ICD-10-CM | POA: Diagnosis not present

## 2014-08-13 DIAGNOSIS — Z789 Other specified health status: Secondary | ICD-10-CM

## 2014-08-13 DIAGNOSIS — M1710 Unilateral primary osteoarthritis, unspecified knee: Secondary | ICD-10-CM

## 2014-08-13 DIAGNOSIS — J0191 Acute recurrent sinusitis, unspecified: Secondary | ICD-10-CM | POA: Insufficient documentation

## 2014-08-13 DIAGNOSIS — J0100 Acute maxillary sinusitis, unspecified: Secondary | ICD-10-CM | POA: Diagnosis not present

## 2014-08-13 DIAGNOSIS — M129 Arthropathy, unspecified: Secondary | ICD-10-CM

## 2014-08-13 DIAGNOSIS — I10 Essential (primary) hypertension: Secondary | ICD-10-CM

## 2014-08-13 HISTORY — DX: Acute recurrent sinusitis, unspecified: J01.91

## 2014-08-13 HISTORY — DX: Other primary ovarian failure: E28.39

## 2014-08-13 MED ORDER — AMOXICILLIN-POT CLAVULANATE 875-125 MG PO TABS: 1.0000 | ORAL_TABLET | Freq: Two times a day (BID) | ORAL | Status: DC

## 2014-08-13 MED ORDER — AMLODIPINE BESYLATE 10 MG PO TABS: 10.0000 mg | ORAL_TABLET | Freq: Every day | ORAL | Status: DC

## 2014-08-13 MED ORDER — FLUTICASONE PROPIONATE 50 MCG/ACT NA SUSP: 2.0000 | Freq: Every day | NASAL | Status: DC

## 2014-08-13 MED ORDER — MELOXICAM 15 MG PO TABS: 15.0000 mg | ORAL_TABLET | Freq: Every day | ORAL | Status: DC

## 2014-08-13 MED ORDER — DICLOFENAC SODIUM 75 MG PO TBEC: 75.0000 mg | DELAYED_RELEASE_TABLET | Freq: Two times a day (BID) | ORAL | Status: DC

## 2014-08-13 NOTE — Assessment & Plan Note (Signed)
Alexis Cole is a 69 y.o. Patient presents to family medicine clinic with multiple complaints.. Arthritis: Replaced diclofenac with meloxicam. Patient will need a referral in July for her orthopedic by her new primary care physician.

## 2014-08-13 NOTE — Assessment & Plan Note (Signed)
Alexis Cole is a 69 y.o. Patient presents to family medicine clinic with multiple complaints.  Acute sinusitis: Continue Flonase, Mucinex, and prescribed Augmentin for acute sinusitis.

## 2014-08-13 NOTE — Assessment & Plan Note (Signed)
Alexis Cole is a 69 y.o. Patient presents to family medicine clinic with multiple complaints. Hypertension: Continue amlodipine 10 mg, continue HCTZ 25 mg. No added salt to your diet. Attempt exercise 150 minutes a week. Follow-up in 3 months.

## 2014-08-13 NOTE — Assessment & Plan Note (Signed)
Alexis Cole is a 69 y.o. Patient presents to family medicine clinic with multiple complaints.  Health Maintenance: Patient recently reestablishing, slowly attempting to get health maintenance aspects completed, without overwhelming her. Mammogram and bone density scheduled for her today. Tetanus vaccination was given to her today. Is encouraged to schedule her colonoscopy as soon as possible.

## 2014-08-13 NOTE — Patient Instructions (Addendum)
You need to schedule your colonoscopy as soon as possible. We have scheduled her mammogram in your bone density test today, we will call you with results once they become available. He received your tetanus shot today, next tetanus will be due in 10 years. Least consider the Zostavax on your next appointment, this is the "shingle shot". You do have signs and symptoms of acute sinusitis today, I have called shoe and I prescription called Augmentin, you will take this 2 times a day for 10 days. Continue to use Flonase every day, and Mucinex to help decrease her congestion. Have replaced meloxicam in place of your diclofenac today, discontinue the diclofenac, do not use both at the same time. Call in early July to ask for your referral to your orthopedic.

## 2014-08-13 NOTE — Progress Notes (Signed)
Subjective:    Patient ID: Alexis Cole, female    DOB: April 06, 1945, 69 y.o.   MRN: [id number]  HPI Patient presents to family medicine clinic with multiple complaints.  Congestion: A she states she's had a cough, headache, facial pressure, nasal congestion, thick phlegm and mild fatigue for 2 weeks. Patient is a every day smoker. Denies any fever or chills. Patient has tried Afrin, NyQuil and Mucinex without great relief. She takes her Flonase daily usually, but had stopped for the last few days to take her Afrin. She is eating and drinking well. No diarrhea. No antibiotic allergies.  Arthritis: Patient states that the diclofenac is helping, but she would like to try meloxicam to see if she gets a better result. Her creatinine has been normal in the past. Patient has an orthopedic physician, and receives viscous injections with them. She will need any referral placed in July for her August appointment.  Health Maintenance: Patient recently reestablishing, slowly attempting to get health maintenance aspects completed, without overwhelming her. She is still due for a mammogram, bone density, Zostavax and tetanus. She is due for a colonoscopy which she has been asked to schedule but has not yet.  Hypertension: Patient was started amlodipine, she discontinued this because she was having leg swelling. she was  seen and placed on lisinopril, with discontinuation of amlodipine. She states that she discontinued  lisinopril because it was causing her dizziness, and now she has restarted amlodipine on her own. She has not experienced any lower extremity edema, and her symptoms seem to have resolved. Continues to take the HCTZ 25 mg. She continues to watch the salt content in her diet. She does not exercise regularly.    Current every day smoker Past Medical History  Diagnosis Date  . Headache(784.0)   . Chronic back pain   . Arthritis   . Depression   . Anxiety     but doesn't take any meds for  this  . Sinusitis   . History of bronchitis     last time 7months  . Hx of migraines     last one a month ago  . Joint pain   . Joint swelling   . Chronic back pain   . Acne     but not being treated for  . Gastric ulcer     history of--many yrs ago  . Anemia     history of--many yrs ago  . Insomnia     Nyquil nightly   No Known Allergies Past Surgical History  Procedure Laterality Date  . Cervical cone biopsy    . Nasal septum surgery  40+yrs ago  . Mouth surgery    . Dilation and curettage of uterus  30+yrs ago  . Tubal ligation  30+yrs ago  . Total hip arthroplasty  09/01/2011    Procedure: TOTAL HIP ARTHROPLASTY;  Surgeon: [name], MD;  Location: [facility];  Service: Orthopedics;  Laterality: Right;     Review of Systems Per history of present illness    Objective:   Physical Exam BP 142/76 mmHg  Pulse 87  Temp(Src) 98.1 F (36.7 C) (Oral)  Resp 16  Wt 194 lb (87.998 kg)  SpO2 100% Gen: NAD. Nontoxic in appearance, well-developed, well-nourished, Caucasian female. Oddly obese. HEENT: AT. [place]. Bilateral TM visualized and normal in appearance. Bilateral eyes without injections or icterus. MMM. Bilateral nares with erythema and swelling, greenish drainage. Throat without erythema or exudates. Cobblestoning present. Hoarseness present. Mild cough  on room. CV: RRR 1/6 systolic murmur appreciated Chest: CTAB, no wheeze or crackles Ext: No erythema. No edema. +2/4 PT     Assessment & Plan:  Alexis Cole is a 69 y.o. Patient presents to family medicine clinic with multiple complaints.  Acute sinusitis: Continue Flonase, Mucinex, and prescribed Augmentin for acute sinusitis. Arthritis: Replaced diclofenac with meloxicam. Patient will need a referral in July for her orthopedic by her new primary care physician.  Health Maintenance: Patient recently reestablishing, slowly attempting to get health maintenance aspects completed, without overwhelming her. Mammogram  and bone density scheduled for her today. Tetanus vaccination was given to her today. Is encouraged to schedule her colonoscopy as soon as possible.   Hypertension: Continue amlodipine 10 mg, continue HCTZ 25 mg. No added salt to your diet. Attempt exercise 150 minutes a week. Follow-up in 3 months.

## 2014-08-19 ENCOUNTER — Other Ambulatory Visit: Payer: Self-pay | Admitting: Family Medicine

## 2014-08-20 ENCOUNTER — Ambulatory Visit
Admission: RE | Admit: 2014-08-20 | Discharge: 2014-08-20 | Disposition: A | Discharge status: Home / Self Care | Payer: Commercial Managed Care - HMO | Source: Ambulatory Visit | Attending: Family Medicine | Admitting: Family Medicine

## 2014-08-20 ENCOUNTER — Encounter: Payer: Self-pay | Admitting: Family Medicine

## 2014-08-20 DIAGNOSIS — N63 Unspecified lump in unspecified breast: Secondary | ICD-10-CM

## 2014-08-20 DIAGNOSIS — E2839 Other primary ovarian failure: Secondary | ICD-10-CM

## 2014-08-20 DIAGNOSIS — Z1231 Encounter for screening mammogram for malignant neoplasm of breast: Secondary | ICD-10-CM

## 2014-08-20 DIAGNOSIS — Z780 Asymptomatic menopausal state: Secondary | ICD-10-CM | POA: Diagnosis not present

## 2014-08-20 DIAGNOSIS — Z13820 Encounter for screening for osteoporosis: Secondary | ICD-10-CM | POA: Diagnosis not present

## 2014-08-20 HISTORY — DX: Unspecified lump in unspecified breast: N63.0

## 2014-08-21 ENCOUNTER — Other Ambulatory Visit: Payer: Self-pay | Admitting: Family Medicine

## 2014-08-21 DIAGNOSIS — R928 Other abnormal and inconclusive findings on diagnostic imaging of breast: Secondary | ICD-10-CM

## 2014-08-31 ENCOUNTER — Other Ambulatory Visit: Payer: Self-pay

## 2014-09-02 ENCOUNTER — Ambulatory Visit
Admission: RE | Admit: 2014-09-02 | Discharge: 2014-09-02 | Disposition: A | Discharge status: Home / Self Care | Payer: Commercial Managed Care - HMO | Source: Ambulatory Visit | Attending: Family Medicine | Admitting: Family Medicine

## 2014-09-02 DIAGNOSIS — R928 Other abnormal and inconclusive findings on diagnostic imaging of breast: Secondary | ICD-10-CM

## 2014-09-05 ENCOUNTER — Other Ambulatory Visit: Payer: Self-pay | Admitting: Family Medicine

## 2014-09-07 NOTE — Telephone Encounter (Signed)
Patient should follow-up in clinic within next month for anxiety/depression.  Will refill for now, but due for f/u visit.  [name], MD, MPH PGY-2,  [facility] 09/07/2014 8:51 AM

## 2014-09-30 ENCOUNTER — Telehealth: Payer: Self-pay | Admitting: Family Medicine

## 2014-09-30 DIAGNOSIS — M1990 Unspecified osteoarthritis, unspecified site: Secondary | ICD-10-CM

## 2014-09-30 DIAGNOSIS — M1611 Unilateral primary osteoarthritis, right hip: Secondary | ICD-10-CM

## 2014-09-30 NOTE — Telephone Encounter (Signed)
Needs referral to [facility] for her 3 month shots Next shot is scheduled for Sept

## 2014-11-06 ENCOUNTER — Other Ambulatory Visit: Payer: Self-pay | Admitting: *Deleted

## 2014-11-06 MED ORDER — AMLODIPINE BESYLATE 10 MG PO TABS: 10.0000 mg | ORAL_TABLET | Freq: Every day | ORAL | Status: DC

## 2014-11-09 DIAGNOSIS — M1712 Unilateral primary osteoarthritis, left knee: Secondary | ICD-10-CM | POA: Diagnosis not present

## 2014-11-09 DIAGNOSIS — M545 Low back pain: Secondary | ICD-10-CM | POA: Diagnosis not present

## 2014-11-25 ENCOUNTER — Other Ambulatory Visit: Payer: Self-pay | Admitting: Family Medicine

## 2014-12-23 ENCOUNTER — Other Ambulatory Visit: Payer: Self-pay

## 2014-12-23 DIAGNOSIS — F341 Dysthymic disorder: Secondary | ICD-10-CM

## 2014-12-30 ENCOUNTER — Other Ambulatory Visit: Payer: Self-pay | Admitting: *Deleted

## 2014-12-30 DIAGNOSIS — F341 Dysthymic disorder: Secondary | ICD-10-CM

## 2014-12-30 DIAGNOSIS — M1710 Unilateral primary osteoarthritis, unspecified knee: Secondary | ICD-10-CM

## 2014-12-30 DIAGNOSIS — IMO0002 Reserved for concepts with insufficient information to code with codable children: Principal | ICD-10-CM

## 2015-01-01 ENCOUNTER — Ambulatory Visit (INDEPENDENT_AMBULATORY_CARE_PROVIDER_SITE_OTHER): Payer: Commercial Managed Care - HMO | Admitting: Family Medicine

## 2015-01-01 ENCOUNTER — Encounter: Payer: Self-pay | Admitting: Family Medicine

## 2015-01-01 VITALS — BP 114/78 | HR 91 | Temp 98.3°F | Ht 66.0 in | Wt 200.0 lb

## 2015-01-01 DIAGNOSIS — E669 Obesity, unspecified: Secondary | ICD-10-CM

## 2015-01-01 DIAGNOSIS — M23201 Derangement of unspecified lateral meniscus due to old tear or injury, left knee: Secondary | ICD-10-CM

## 2015-01-01 DIAGNOSIS — I10 Essential (primary) hypertension: Secondary | ICD-10-CM

## 2015-01-01 DIAGNOSIS — M23204 Derangement of unspecified medial meniscus due to old tear or injury, left knee: Secondary | ICD-10-CM

## 2015-01-01 DIAGNOSIS — M62838 Other muscle spasm: Secondary | ICD-10-CM

## 2015-01-01 DIAGNOSIS — M23207 Derangement of unspecified meniscus due to old tear or injury, left knee: Secondary | ICD-10-CM

## 2015-01-01 DIAGNOSIS — M1990 Unspecified osteoarthritis, unspecified site: Secondary | ICD-10-CM | POA: Diagnosis not present

## 2015-01-01 LAB — BASIC METABOLIC PANEL WITH GFR
BUN: 16 mg/dL (ref 7–25)
CALCIUM: 10.5 mg/dL — AB (ref 8.6–10.4)
CO2: 23 mmol/L (ref 20–31)
CREATININE: 0.72 mg/dL (ref 0.50–0.99)
Chloride: 101 mmol/L (ref 98–110)
GFR, Est African American: >89 mL/min (ref >=60)
GFR, Est Non African American: 86 mL/min (ref >=60)
GLUCOSE: 88 mg/dL (ref 65–99)
Potassium: 4.2 mmol/L (ref 3.5–5.3)
SODIUM: 137 mmol/L (ref 135–146)

## 2015-01-01 LAB — LIPID PANEL
Cholesterol: 265 mg/dL — ABNORMAL HIGH (ref 125–200)
HDL: 40 mg/dL — ABNORMAL LOW (ref >=46)
LDL Cholesterol: 184 mg/dL — ABNORMAL HIGH (ref <130)
Total CHOL/HDL Ratio: 6.6 Ratio — ABNORMAL HIGH (ref <=5.0)
Triglycerides: 204 mg/dL — ABNORMAL HIGH (ref <150)
VLDL: 41 mg/dL — ABNORMAL HIGH (ref <30)

## 2015-01-01 LAB — POCT GLYCOSYLATED HEMOGLOBIN (HGB A1C): Hemoglobin A1C: 5.9

## 2015-01-01 MED ORDER — BACLOFEN 10 MG PO TABS: 5.0000 mg | ORAL_TABLET | Freq: Three times a day (TID) | ORAL | Status: DC

## 2015-01-01 MED ORDER — FLUTICASONE PROPIONATE 50 MCG/ACT NA SUSP: 2.0000 | Freq: Every day | NASAL | Status: DC

## 2015-01-01 MED ORDER — NORTRIPTYLINE HCL 50 MG PO CAPS: ORAL_CAPSULE | ORAL | Status: DC

## 2015-01-01 MED ORDER — DICLOFENAC SODIUM 75 MG PO TBEC: 75.0000 mg | DELAYED_RELEASE_TABLET | Freq: Two times a day (BID) | ORAL | Status: DC

## 2015-01-01 MED ORDER — LORATADINE 10 MG PO TABS: 10.0000 mg | ORAL_TABLET | Freq: Every day | ORAL | Status: DC

## 2015-01-01 MED ORDER — HYDROCHLOROTHIAZIDE 25 MG PO TABS: 25.0000 mg | ORAL_TABLET | Freq: Every day | ORAL | Status: DC

## 2015-01-01 MED ORDER — BUSPIRONE HCL 7.5 MG PO TABS: ORAL_TABLET | ORAL | Status: DC

## 2015-01-01 MED ORDER — LISINOPRIL 20 MG PO TABS: 20.0000 mg | ORAL_TABLET | Freq: Every day | ORAL | Status: DC

## 2015-01-01 NOTE — Patient Instructions (Signed)
Nice to meet you today. Please call your insurance company and see if they will cover the flu shot. You can come back for nurse visit to get it if they will cover it. We will get some labs today and someone call you or send you a letter with the results when they're available. I like to see back in 6 months to follow-up on your blood pressure.  Take care,  Dr. Jacinto Reap

## 2015-01-01 NOTE — Progress Notes (Signed)
   Subjective:   Alexis Cole is a 69 y.o. female with a history of OA, HTN, anxiety and depression here for medication refills  Arthritis:  - states voltaren is helping - followed by [facility] for L TKR 04/2015 - getting some injections   HTN: - Medications: lisinopril 20mg  daily, HCTZ 25mg  daily - Compliance: good - Checking BP at home: no - Denies any SOB, CP, new LE edema, medication SEs, or symptoms of hypotension - Diet: good, balanced, more vegetables, cut out salt - Exercise: none  Review of Systems:  Per HPI.   PMH, PSH, Medications, Allergies, and FmHx reviewed and updated in EMR.  Social History: current smoker - 0.5 ppd - quit for 3 weeks, but too much stress right now  Objective:  BP 114/78 mmHg  Pulse 91  Temp(Src) 98.3 F (36.8 C) (Oral)  Ht 5\' 6"  (1.676 m)  Wt 200 lb (90.719 kg)  BMI 32.30 kg/m2  SpO2 99%  Gen:  69 y.o. female in NAD HEENT: NCAT, MMM, EOMI, PERRL, anicteric sclerae CV: RRR, no MRG, intact distal pulses Resp: Non-labored, CTAB, no wheezes noted Ext: WWP, no edema MSK: TTP over L knee, good ROM Neuro: Alert and oriented, speech normal      Chemistry      Component Value Date/Time   NA 137 01/01/2015 1649   K 4.2 01/01/2015 1649   CL 101 01/01/2015 1649   CO2 23 01/01/2015 1649   BUN 16 01/01/2015 1649   CREATININE 0.72 01/01/2015 1649   CREATININE 0.57 09/02/2011 2320      Component Value Date/Time   CALCIUM 10.5* 01/01/2015 1649   ALKPHOS 82 05/07/2014 1430   AST 17 05/07/2014 1430   ALT 22 05/07/2014 1430   BILITOT 0.3 05/07/2014 1430      Lab Results  Component Value Date   WBC 8.5 05/28/2014   HGB 14.0 05/28/2014   HCT 41.6 05/28/2014   MCV 90.2 05/28/2014   PLT 285 05/28/2014   Lab Results  Component Value Date   TSH 1.098 05/07/2014   Lab Results  Component Value Date   HGBA1C 5.9 01/01/2015   Assessment & Plan:     Alexis Cole is a 69 y.o. female here for   Essential  hypertension, benign Well controlled Continue current meds BMET today Screening lipid panel and a1c F/u in 6 months  Arthritis Followed by ortho voltaren refilled   Obesity Encouraged diet and exercise a1c and lipid panel today     [name], MD MPH PGY-2,  [facility] 01/02/2015  11:12 AM

## 2015-01-02 NOTE — Assessment & Plan Note (Signed)
Well controlled Continue current meds BMET today Screening lipid panel and a1c F/u in 6 months

## 2015-01-02 NOTE — Assessment & Plan Note (Signed)
Encouraged diet and exercise a1c and lipid panel today

## 2015-01-02 NOTE — Assessment & Plan Note (Signed)
Followed by ortho voltaren refilled

## 2015-01-04 ENCOUNTER — Telehealth: Payer: Self-pay | Admitting: Family Medicine

## 2015-01-04 DIAGNOSIS — E785 Hyperlipidemia, unspecified: Secondary | ICD-10-CM | POA: Insufficient documentation

## 2015-01-04 MED ORDER — ATORVASTATIN CALCIUM 40 MG PO TABS: 40.0000 mg | ORAL_TABLET | Freq: Every day | ORAL | Status: DC

## 2015-01-04 NOTE — Telephone Encounter (Signed)
Called patient to discuss labs.  No answer, asked patient to call back.    Electrolytes and kidney function normal.    Cholesterol quite elevated with ASCVD 10 year risk 19.6%.  A1c also elevated at 5.9 in pre-diabetes range.  Sending atorvastatin 40mg  daily to pharmacy.  Would like patient to start taking it and come back to see me in 1 month to follow-up.  [name], MD, MPH PGY-2,  [facility] 01/04/2015 8:40 AM

## 2015-01-04 NOTE — Telephone Encounter (Signed)
x

## 2015-01-05 NOTE — Telephone Encounter (Signed)
Left message with woman for patient to call back. 

## 2015-01-26 DIAGNOSIS — M25562 Pain in left knee: Secondary | ICD-10-CM | POA: Diagnosis not present

## 2015-01-26 DIAGNOSIS — M545 Low back pain: Secondary | ICD-10-CM | POA: Diagnosis not present

## 2015-01-26 DIAGNOSIS — M1712 Unilateral primary osteoarthritis, left knee: Secondary | ICD-10-CM | POA: Diagnosis not present

## 2015-02-01 ENCOUNTER — Encounter: Payer: Self-pay | Admitting: Family Medicine

## 2015-02-01 ENCOUNTER — Ambulatory Visit (INDEPENDENT_AMBULATORY_CARE_PROVIDER_SITE_OTHER): Payer: Commercial Managed Care - HMO | Admitting: Family Medicine

## 2015-02-01 VITALS — BP 134/69 | HR 89 | Temp 98.5°F | Ht 66.0 in | Wt 203.0 lb

## 2015-02-01 DIAGNOSIS — J0100 Acute maxillary sinusitis, unspecified: Secondary | ICD-10-CM

## 2015-02-01 MED ORDER — AMOXICILLIN-POT CLAVULANATE 875-125 MG PO TABS: 1.0000 | ORAL_TABLET | Freq: Two times a day (BID) | ORAL | Status: DC

## 2015-02-01 NOTE — Patient Instructions (Signed)
Sent in augmentin Return if not improving with this  Be well, Dr. Ardelia Mems  Sinusitis, Adult Sinusitis is redness, soreness, and inflammation of the paranasal sinuses. Paranasal sinuses are air pockets within the bones of your face. They are located beneath your eyes, in the middle of your forehead, and above your eyes. In healthy paranasal sinuses, mucus is able to drain out, and air is able to circulate through them by way of your nose. However, when your paranasal sinuses are inflamed, mucus and air can become trapped. This can allow bacteria and other germs to grow and cause infection. Sinusitis can develop quickly and last only a short time (acute) or continue over a long period (chronic). Sinusitis that lasts for more than 12 weeks is considered chronic. CAUSES Causes of sinusitis include:  Allergies.  Structural abnormalities, such as displacement of the cartilage that separates your nostrils (deviated septum), which can decrease the air flow through your nose and sinuses and affect sinus drainage.  Functional abnormalities, such as when the small hairs (cilia) that line your sinuses and help remove mucus do not work properly or are not present. SIGNS AND SYMPTOMS Symptoms of acute and chronic sinusitis are the same. The primary symptoms are pain and pressure around the affected sinuses. Other symptoms include:  Upper toothache.  Earache.  Headache.  Bad breath.  Decreased sense of smell and taste.  A cough, which worsens when you are lying flat.  Fatigue.  Fever.  Thick drainage from your nose, which often is green and may contain pus (purulent).  Swelling and warmth over the affected sinuses. DIAGNOSIS Your health care provider will perform a physical exam. During your exam, your health care provider may perform any of the following to help determine if you have acute sinusitis or chronic sinusitis:  Look in your nose for signs of abnormal growths in your nostrils  (nasal polyps).  Tap over the affected sinus to check for signs of infection.  View the inside of your sinuses using an imaging device that has a light attached (endoscope). If your health care provider suspects that you have chronic sinusitis, one or more of the following tests may be recommended:  Allergy tests.  Nasal culture. A sample of mucus is taken from your nose, sent to a lab, and screened for bacteria.  Nasal cytology. A sample of mucus is taken from your nose and examined by your health care provider to determine if your sinusitis is related to an allergy. TREATMENT Most cases of acute sinusitis are related to a viral infection and will resolve on their own within 10 days. Sometimes, medicines are prescribed to help relieve symptoms of both acute and chronic sinusitis. These may include pain medicines, decongestants, nasal steroid sprays, or saline sprays. However, for sinusitis related to a bacterial infection, your health care provider will prescribe antibiotic medicines. These are medicines that will help kill the bacteria causing the infection. Rarely, sinusitis is caused by a fungal infection. In these cases, your health care provider will prescribe antifungal medicine. For some cases of chronic sinusitis, surgery is needed. Generally, these are cases in which sinusitis recurs more than 3 times per year, despite other treatments. HOME CARE INSTRUCTIONS  Drink plenty of water. Water helps thin the mucus so your sinuses can drain more easily.  Use a humidifier.  Inhale steam 3-4 times a day (for example, sit in the bathroom with the shower running).  Apply a warm, moist washcloth to your face 3-4 times a day,  or as directed by your health care provider.  Use saline nasal sprays to help moisten and clean your sinuses.  Take medicines only as directed by your health care provider.  If you were prescribed either an antibiotic or antifungal medicine, finish it all even if  you start to feel better. SEEK IMMEDIATE MEDICAL CARE IF:  You have increasing pain or severe headaches.  You have nausea, vomiting, or drowsiness.  You have swelling around your face.  You have vision problems.  You have a stiff neck.  You have difficulty breathing.   This information is not intended to replace advice given to you by your health care provider. Make sure you discuss any questions you have with your health care provider.   Document Released: 02/06/2005 Document Revised: 02/27/2014 Document Reviewed: 02/21/2011 Elsevier Interactive Patient Education [employer].

## 2015-02-01 NOTE — Progress Notes (Signed)
Date of Visit: 02/01/2015   HPI:  Pt presents for a same day appointment to discuss sinusitis. Gets sinus infections about 1-2 times per year. Has had sinus surgery on L side for deviated septum. Does not wish to see ENT. Pain this time is primarily on R side of face. Febrile to 101 the other day. Symptoms present for four days. Eating and drinking well. No vision changes. No allergies for medications. Feels pain and pressure in R maxillary area as well as in her teeth.  ROS: See HPI  [place]: history chronic low back pain, anxiety/dep, hypertension, hyperlipidemia   PHYSICAL EXAM: BP 134/69 mmHg  Pulse 89  Temp(Src) 98.5 F (36.9 C) (Oral)  Ht 5\' 6"  (1.676 m)  Wt 203 lb (92.08 kg)  BMI 32.78 kg/m2 Gen: NAD, pleasant, cooperative HEENT: normocephalic, atraumatic, oropharynx clear. Very poor dentition present. No gum abscess palpable. R maxillary sinus tender to palpation. Nares congested. Heart: regular rate and rhythm no murmur Lungs: clear to auscultation bilaterally normal work of breathing  Neuro: alert, grossly nonfocal, speech normal  ASSESSMENT/PLAN:  1. Acute sinusitis - rx augmentin twice daily for 10 days. Follow up if not improving. Discussed option of ENT referral but patient declined at this time.  FOLLOW UP: F/u as needed if symptoms worsen or do not improve.   [place]. Ardelia Mems, [place]

## 2015-02-26 ENCOUNTER — Other Ambulatory Visit: Payer: Self-pay | Admitting: *Deleted

## 2015-03-02 NOTE — Telephone Encounter (Signed)
2nd request.  Trig Mcbryar L, RN  

## 2015-03-03 ENCOUNTER — Telehealth: Payer: Self-pay | Admitting: Family Medicine

## 2015-03-03 MED ORDER — BUSPIRONE HCL 7.5 MG PO TABS: ORAL_TABLET | ORAL | Status: DC

## 2015-03-03 NOTE — Telephone Encounter (Signed)
Pt called and needs a refill on her Buspar. She also wanted the doctor to know that she goes to [place] and see Dr. Jacelyn Grip. Blima Rich

## 2015-03-03 NOTE — Telephone Encounter (Signed)
Pt called back and wanted to add Dr. Berenice Primas said to put shots in her left knee. jw

## 2015-03-03 NOTE — Telephone Encounter (Signed)
Refill already sent to pharmacy.  It is fine for her to see Ortho and continue injections as directed.  [name], MD, MPH PGY-2,  [facility] 03/03/2015 9:38 AM

## 2015-04-10 ENCOUNTER — Other Ambulatory Visit: Payer: Self-pay | Admitting: Family Medicine

## 2015-04-14 ENCOUNTER — Telehealth: Payer: Self-pay | Admitting: Family Medicine

## 2015-04-14 NOTE — Telephone Encounter (Signed)
Pt states she needs refill on nortripyline.  It is now a Tier 4 which cost $177.  She would like a medication on Tier 1 or 2.   [facility] [address]

## 2015-04-14 NOTE — Telephone Encounter (Signed)
Needs refills on hctz and lisionpril.  Will run out in 2 days.  [facility] [address]

## 2015-04-14 NOTE — Telephone Encounter (Signed)
Left patient a voice message advising patient to call her insurance to get what options she has for Tier 1 or 2 drugs.  Derl Barrow, RN

## 2015-04-16 ENCOUNTER — Other Ambulatory Visit: Payer: Self-pay | Admitting: Family Medicine

## 2015-04-16 DIAGNOSIS — I10 Essential (primary) hypertension: Secondary | ICD-10-CM

## 2015-04-16 NOTE — Telephone Encounter (Signed)
Pt is calling because she requested refills on her Hydrochlorothiazide and Lisinopril to be called in. She will be out of medication tomorrow. Can we please get this called in. jw

## 2015-04-19 MED ORDER — LISINOPRIL 20 MG PO TABS: 20.0000 mg | ORAL_TABLET | Freq: Every day | ORAL | Status: DC

## 2015-04-19 MED ORDER — HYDROCHLOROTHIAZIDE 25 MG PO TABS: 25.0000 mg | ORAL_TABLET | Freq: Every day | ORAL | Status: DC

## 2015-04-21 ENCOUNTER — Ambulatory Visit (INDEPENDENT_AMBULATORY_CARE_PROVIDER_SITE_OTHER): Payer: Commercial Managed Care - HMO | Admitting: Family Medicine

## 2015-04-21 ENCOUNTER — Encounter: Payer: Self-pay | Admitting: Family Medicine

## 2015-04-21 VITALS — BP 125/70 | HR 77 | Temp 98.3°F | Ht 66.0 in | Wt 207.0 lb

## 2015-04-21 DIAGNOSIS — M23204 Derangement of unspecified medial meniscus due to old tear or injury, left knee: Secondary | ICD-10-CM

## 2015-04-21 DIAGNOSIS — M545 Low back pain: Secondary | ICD-10-CM | POA: Diagnosis not present

## 2015-04-21 DIAGNOSIS — E669 Obesity, unspecified: Secondary | ICD-10-CM

## 2015-04-21 DIAGNOSIS — G4700 Insomnia, unspecified: Secondary | ICD-10-CM | POA: Insufficient documentation

## 2015-04-21 DIAGNOSIS — G8929 Other chronic pain: Secondary | ICD-10-CM

## 2015-04-21 DIAGNOSIS — F17200 Nicotine dependence, unspecified, uncomplicated: Secondary | ICD-10-CM

## 2015-04-21 DIAGNOSIS — I10 Essential (primary) hypertension: Secondary | ICD-10-CM | POA: Diagnosis not present

## 2015-04-21 DIAGNOSIS — M23201 Derangement of unspecified lateral meniscus due to old tear or injury, left knee: Secondary | ICD-10-CM

## 2015-04-21 DIAGNOSIS — M23207 Derangement of unspecified meniscus due to old tear or injury, left knee: Secondary | ICD-10-CM

## 2015-04-21 LAB — BASIC METABOLIC PANEL WITH GFR
BUN: 21 mg/dL (ref 7–25)
CO2: 25 mmol/L (ref 20–31)
CREATININE: 0.69 mg/dL (ref 0.50–0.99)
Calcium: 10.2 mg/dL (ref 8.6–10.4)
Chloride: 104 mmol/L (ref 98–110)
Glucose, Bld: 105 mg/dL — ABNORMAL HIGH (ref 65–99)
Potassium: 4.1 mmol/L (ref 3.5–5.3)
Sodium: 138 mmol/L (ref 135–146)

## 2015-04-21 MED ORDER — AMITRIPTYLINE HCL 75 MG PO TABS: 75.0000 mg | ORAL_TABLET | Freq: Every day | ORAL | Status: DC

## 2015-04-21 MED ORDER — LORATADINE 10 MG PO TABS: 10.0000 mg | ORAL_TABLET | Freq: Every day | ORAL | Status: DC

## 2015-04-21 MED ORDER — ATORVASTATIN CALCIUM 40 MG PO TABS: 40.0000 mg | ORAL_TABLET | Freq: Every day | ORAL | Status: DC

## 2015-04-21 NOTE — Assessment & Plan Note (Signed)
Patient currently pre-contemplative about quitting Advised on the health benefits of tobacco cessation

## 2015-04-21 NOTE — Assessment & Plan Note (Signed)
Well-controlled on manual recheck Continue current medications BMP ordered today Follow-up in 6 months

## 2015-04-21 NOTE — Assessment & Plan Note (Signed)
Voltaren was recently refilled Patient continues 3 followed by orthopedics Referral re-placed

## 2015-04-21 NOTE — Progress Notes (Signed)
   Subjective:   Alexis Cole is a 70 y.o. female with a history of OA, HTN, anxiety and depression here for medication refills  Arthritis:  - states voltaren is helping - followed by [facility] - holding off on TKR - getting some injections from ortho - needs re-referrals to Ortho and their spine specialists  HTN: - Medications: lisinopril 20mg  daily, HCTZ 25mg  daily - Compliance: good - had been out of meds for 4 days prior to yesterday - Checking BP at home: no - Denies any SOB, CP, new LE edema, medication SEs, or symptoms of hypotension  Obesity - working on diet - on Atkins - hopeful that weight loss will help back pain - hopeful to start water aerobics 1-2 days/wk  Insomnia - currently taking nortriptyline 50 mg daily at bedtime - Advised by her insurance company that this would be raised from tier 2 pricing to tier 4 pricing which she cannot afford - Reports a lot of difficulty sleeping she does not take his medication  Review of Systems:  Per HPI.   Social History: current smoker - quit and then started back, too much stress to consider quitting again  Objective:  BP 125/70 mmHg  Pulse 77  Temp(Src) 98.3 F (36.8 C) (Oral)  Ht 5\' 6"  (1.676 m)  Wt 207 lb (93.895 kg)  BMI 33.43 kg/m2  Gen:  70 y.o. female in NAD HEENT: NCAT, MMM, EOMI, PERRL, anicteric sclerae CV: RRR, no MRG Resp: Non-labored, CTAB, no wheezes noted Ext: WWP, no edema MSK: TTP over L knee, good ROM Neuro: Alert and oriented, speech normal      Chemistry      Component Value Date/Time   NA 137 01/01/2015 1649   K 4.2 01/01/2015 1649   CL 101 01/01/2015 1649   CO2 23 01/01/2015 1649   BUN 16 01/01/2015 1649   CREATININE 0.72 01/01/2015 1649   CREATININE 0.57 09/02/2011 2320      Component Value Date/Time   CALCIUM 10.5* 01/01/2015 1649   ALKPHOS 82 05/07/2014 1430   AST 17 05/07/2014 1430   ALT 22 05/07/2014 1430   BILITOT 0.3 05/07/2014 1430      Lab Results    Component Value Date   WBC 8.5 05/28/2014   HGB 14.0 05/28/2014   HCT 41.6 05/28/2014   MCV 90.2 05/28/2014   PLT 285 05/28/2014   Lab Results  Component Value Date   TSH 1.098 05/07/2014   Lab Results  Component Value Date   HGBA1C 5.9 01/01/2015   Assessment & Plan:     Alexis Cole is a 70 y.o. female here for   Essential hypertension, benign Well-controlled on manual recheck Continue current medications BMP ordered today Follow-up in 6 months  Chronic meniscal tear of knee Voltaren was recently refilled Patient continues 3 followed by orthopedics Referral re-placed  LOW BACK PAIN, CHRONIC Patient followed by orthopedics Referral placed for their spine specialists at [place] use disorder Patient currently pre-contemplative about quitting Advised on the health benefits of tobacco cessation  Obesity Encourage diet and exercise efforts  Insomnia Due to pricing of current medications, will switch nortriptyline to amitriptyline 75 mg daily at bedtime Patient advised about possible anticholinergic effects given her age     [name], MD MPH PGY-2,  [facility] 04/21/2015  12:14 PM

## 2015-04-21 NOTE — Assessment & Plan Note (Signed)
Due to pricing of current medications, will switch nortriptyline to amitriptyline 75 mg daily at bedtime Patient advised about possible anticholinergic effects given her age

## 2015-04-21 NOTE — Assessment & Plan Note (Signed)
Encourage diet and exercise efforts

## 2015-04-21 NOTE — Assessment & Plan Note (Signed)
Patient followed by orthopedics Referral placed for their spine specialists at [place]

## 2015-04-21 NOTE — Patient Instructions (Signed)
Nice to see you again today. Your blood pressure is good on recheck. Continue taking your current medications. I sent refills to pharmacy. I have stopped the nortriptyline and started amitriptyline 75 mg daily at bedtime to help with sleep. This medication should be $10 for a 3 month supply at [facility]. I sent this medicine to the [facility] on [address]. Do not take the nortriptyline and amitriptyline at the same time. Amitriptyline works similarly to the nortriptyline. Please let me know if it is getting any problems. Otherwise I will see back in 6 months to follow-up on your blood pressure. We are getting some labs today and someone will call you or send you a letter with the results when they're available.  Take care, Dr. Jacinto Reap

## 2015-04-22 ENCOUNTER — Encounter: Payer: Self-pay | Admitting: Family Medicine

## 2015-04-26 ENCOUNTER — Other Ambulatory Visit: Payer: Self-pay | Admitting: Family Medicine

## 2015-04-27 NOTE — Telephone Encounter (Signed)
Alexis Cole called to say that she need her rx for [facility]yline [facility] [address] because [facility]ie [facility] is not on [employer] of pharmacies.

## 2015-05-05 ENCOUNTER — Other Ambulatory Visit: Payer: Self-pay | Admitting: Family Medicine

## 2015-05-05 ENCOUNTER — Telehealth: Payer: Self-pay | Admitting: Family Medicine

## 2015-05-05 MED ORDER — AMITRIPTYLINE HCL 75 MG PO TABS: 75.0000 mg | ORAL_TABLET | Freq: Every day | ORAL | Status: DC

## 2015-05-05 NOTE — Telephone Encounter (Signed)
[facility]  We had sent it to [facility], because patient reported it wouold be expensive through her insurance.  At [facility], it is on $4 list and she can buy it out of pocket without insurance coverage.  She can also have prescriptions transferred to other pharmacies herself by calling in the future.  Please let patient know. Thanks!  [name], MD, MPH PGY-2,  [facility] 05/05/2015 9:40 AM

## 2015-05-05 NOTE — Telephone Encounter (Signed)
Per Wells Guiles at [facility]. Referral was received and patient has been called numerous times to scheduled. Referral was placed for Dr. Berenice Primas for Knees and Dr. Mina Marble for back but Wells Guiles states that when pt was contacted, she stated she did not want to see Dr. Mina Marble. Patient can call and schedule with Dr. Berenice Primas at her convenience.

## 2015-05-05 NOTE — Telephone Encounter (Signed)
See other phone note dated 3/15.

## 2015-05-05 NOTE — Telephone Encounter (Signed)
Referral was placed during last appt.  Will send to referral coordinator to check on status.  Patient was informed to await call and that it could take several weeks.  [name], MD, MPH PGY-2,  [facility] 05/05/2015 10:47 AM

## 2015-05-05 NOTE — Telephone Encounter (Signed)
Please let patient know to call and schedule appt. Thanks!  [name], MD, MPH PGY-2,  [facility] 05/05/2015 11:34 AM

## 2015-05-05 NOTE — Telephone Encounter (Signed)
Patient given information

## 2015-05-05 NOTE — Telephone Encounter (Signed)
Needs referral to Alexis Cole Dr Alexis Cole for shot in left knee and right lower back

## 2015-05-05 NOTE — Telephone Encounter (Signed)
Ms. Strutz need to have a referral sent to [facility] to see Dr. Berenice Primas.   Please inform patient when appt scheduled

## 2015-05-05 NOTE — Telephone Encounter (Signed)
Alexis Cole called to say insurance company want her to get a rx sent in [facility]yline [facility] [address].

## 2015-05-05 NOTE — Telephone Encounter (Signed)
Left message on patient voicemail that referral has already been made and she may contact their office to set up appointment.

## 2015-05-20 DIAGNOSIS — M5441 Lumbago with sciatica, right side: Secondary | ICD-10-CM | POA: Diagnosis not present

## 2015-05-20 DIAGNOSIS — M1712 Unilateral primary osteoarthritis, left knee: Secondary | ICD-10-CM | POA: Diagnosis not present

## 2015-05-20 DIAGNOSIS — M545 Low back pain: Secondary | ICD-10-CM | POA: Diagnosis not present

## 2015-07-26 DIAGNOSIS — M5416 Radiculopathy, lumbar region: Secondary | ICD-10-CM | POA: Diagnosis not present

## 2015-07-26 DIAGNOSIS — Z716 Tobacco abuse counseling: Secondary | ICD-10-CM | POA: Diagnosis not present

## 2015-07-29 ENCOUNTER — Other Ambulatory Visit: Payer: Self-pay | Admitting: Family Medicine

## 2015-07-30 ENCOUNTER — Other Ambulatory Visit: Payer: Self-pay | Admitting: Family Medicine

## 2015-08-04 DIAGNOSIS — M545 Low back pain: Secondary | ICD-10-CM | POA: Diagnosis not present

## 2015-08-12 DIAGNOSIS — M1712 Unilateral primary osteoarthritis, left knee: Secondary | ICD-10-CM | POA: Diagnosis not present

## 2015-08-12 DIAGNOSIS — M545 Low back pain: Secondary | ICD-10-CM | POA: Diagnosis not present

## 2015-08-12 DIAGNOSIS — M5441 Lumbago with sciatica, right side: Secondary | ICD-10-CM | POA: Diagnosis not present

## 2015-08-23 ENCOUNTER — Encounter: Payer: Self-pay | Admitting: Family Medicine

## 2015-08-23 ENCOUNTER — Ambulatory Visit (INDEPENDENT_AMBULATORY_CARE_PROVIDER_SITE_OTHER): Payer: Commercial Managed Care - HMO | Admitting: Family Medicine

## 2015-08-23 VITALS — BP 140/72 | HR 96 | Temp 97.7°F | Ht 66.0 in | Wt 199.0 lb

## 2015-08-23 DIAGNOSIS — M545 Low back pain: Secondary | ICD-10-CM | POA: Diagnosis not present

## 2015-08-23 DIAGNOSIS — G4700 Insomnia, unspecified: Secondary | ICD-10-CM | POA: Diagnosis not present

## 2015-08-23 DIAGNOSIS — M1990 Unspecified osteoarthritis, unspecified site: Secondary | ICD-10-CM | POA: Diagnosis not present

## 2015-08-23 DIAGNOSIS — I10 Essential (primary) hypertension: Secondary | ICD-10-CM

## 2015-08-23 DIAGNOSIS — G8929 Other chronic pain: Secondary | ICD-10-CM

## 2015-08-23 MED ORDER — GABAPENTIN 100 MG PO CAPS: 100.0000 mg | ORAL_CAPSULE | Freq: Every day | ORAL | Status: DC

## 2015-08-23 NOTE — Progress Notes (Signed)
Subjective:   Alexis Cole is a 70 y.o. female with a history of OA, HTN, anxiety and depression here for medication refills  Arthritis:  - states voltaren is helping - followed by Cassie Freer - holding off on TKR - getting some injections from ortho  Back pain: - back specialist at [facility] did MRI on back - said they didn't think she needed surgery - MRI 08/04/15 - L lateral disc protrusion at L2-3, 31mm grade 1 anterolisthesis at L4-5 2/2 advanced facet hypertrophy, mild central and bilateral foraminal narrowing L4-5 worse on left, mild left foraminal narrowing L5-S1 2/2 endplate and facet spurring, facet hypertrophy is present at remaining levels as well but without focal stenosis - taking robaxin instead of baclofen - prescribed by Ortho  HTN: - Medications: lisinopril 20mg  daily, HCTZ 25mg  daily - Compliance: good - Checking BP at home: no - Denies any SOB, CP, new LE edema, medication SEs, or symptoms of hypotension  Insomnia - Reports a lot of difficulty sleeping she does not take his medication - switched from nortriptyline to amytriptyline at last visit due to insurance coverage - felt depressed on amitrytline  - taking nyquil at bedtime now - no problems with nortriptyline - used to take alprazolam - never tried gabapentin  Review of Systems:  Per HPI.   Social History: current smoker - quit and then started back, too much stress to consider quitting again  Objective:  BP 140/72 mmHg  Pulse 96  Temp(Src) 97.7 F (36.5 C) (Oral)  Ht 5\' 6"  (1.676 m)  Wt 199 lb (90.266 kg)  BMI 32.13 kg/m2  Gen:  70 y.o. female in NAD HEENT: NCAT, MMM, EOMI, PERRL, anicteric sclerae CV: RRR, no MRG Resp: Non-labored, CTAB, no wheezes noted Ext: WWP, no edema MSK: TTP over L knee, good ROM Neuro: Alert and oriented, speech normal      Chemistry      Component Value Date/Time   NA 138 04/21/2015 1051   K 4.1 04/21/2015 1051   CL 104 04/21/2015 1051   CO2 25  04/21/2015 1051   BUN 21 04/21/2015 1051   CREATININE 0.69 04/21/2015 1051   CREATININE 0.57 09/02/2011 2320      Component Value Date/Time   CALCIUM 10.2 04/21/2015 1051   ALKPHOS 82 05/07/2014 1430   AST 17 05/07/2014 1430   ALT 22 05/07/2014 1430   BILITOT 0.3 05/07/2014 1430      Lab Results  Component Value Date   WBC 8.5 05/28/2014   HGB 14.0 05/28/2014   HCT 41.6 05/28/2014   MCV 90.2 05/28/2014   PLT 285 05/28/2014   Lab Results  Component Value Date   TSH 1.098 05/07/2014   Lab Results  Component Value Date   HGBA1C 5.9 01/01/2015   Assessment & Plan:     Alexis Cole is a 70 y.o. female here for   Essential hypertension, benign Well-controlled on manual recheck Continue current medications Up-to-date on lab work  Arthritis Continue Voltaren Followed by [name]  LOW BACK PAIN, CHRONIC Followed by [name] spine specialist MRI shows multilevel arthritis Doing well on Robaxin - advised patient to discuss with orthopedist whether she should continue this  Insomnia Stop amitriptyline Start gabapentin 100 mg daily at bedtime and increase to 300 mg daily at bedtime after 1-2 weeks if not to fatigued during the day Follow-up in one month     [name], MD MPH PGY-3,  [facility] 08/23/2015  4:53 PM

## 2015-08-23 NOTE — Assessment & Plan Note (Signed)
Well-controlled on manual recheck Continue current medications Up-to-date on lab work

## 2015-08-23 NOTE — Assessment & Plan Note (Signed)
Followed by [name] spine specialist MRI shows multilevel arthritis Doing well on Robaxin - advised patient to discuss with orthopedist whether she should continue this

## 2015-08-23 NOTE — Assessment & Plan Note (Signed)
Continue Voltaren Followed by [name]

## 2015-08-23 NOTE — Assessment & Plan Note (Signed)
Stop amitriptyline Start gabapentin 100 mg daily at bedtime and increase to 300 mg daily at bedtime after 1-2 weeks if not to fatigued during the day Follow-up in one month

## 2015-08-23 NOTE — Patient Instructions (Signed)
Nice to see you again today. Continue taking your current medications for your blood pressure. We will see you back in one month for your insomnia. Start taking gabapentin 100 mg daily at bedtime and then increase to 300 mg daily at bedtime after 1-2 weeks if you are not too fatigued in the mornings.  Take care, Dr. Jacinto Reap

## 2015-08-30 DIAGNOSIS — M47816 Spondylosis without myelopathy or radiculopathy, lumbar region: Secondary | ICD-10-CM | POA: Diagnosis not present

## 2015-09-01 ENCOUNTER — Telehealth: Payer: Self-pay | Admitting: *Deleted

## 2015-09-01 NOTE — Telephone Encounter (Signed)
Patient states PCP told her to call when she switched from the baclofen to methocarbamol. Patient states the methocarbamol has worked better and needs [facility]

## 2015-09-02 MED ORDER — METHOCARBAMOL 500 MG PO TABS: 500.0000 mg | ORAL_TABLET | Freq: Three times a day (TID) | ORAL | Status: DC | PRN

## 2015-09-02 NOTE — Telephone Encounter (Signed)
Rx sent to pharmacy.  [name], MD, MPH PGY-3,  [facility] 09/02/2015 2:50 PM

## 2015-09-13 ENCOUNTER — Ambulatory Visit (INDEPENDENT_AMBULATORY_CARE_PROVIDER_SITE_OTHER): Payer: Commercial Managed Care - HMO | Admitting: Family Medicine

## 2015-09-13 ENCOUNTER — Other Ambulatory Visit: Payer: Self-pay | Admitting: *Deleted

## 2015-09-13 VITALS — BP 119/75 | HR 82 | Temp 98.7°F | Wt 194.4 lb

## 2015-09-13 DIAGNOSIS — G4700 Insomnia, unspecified: Secondary | ICD-10-CM | POA: Diagnosis not present

## 2015-09-13 MED ORDER — GABAPENTIN 100 MG PO CAPS: 300.0000 mg | ORAL_CAPSULE | Freq: Every day | ORAL | 3 refills | Status: DC

## 2015-09-13 NOTE — Patient Instructions (Signed)
Nice to see you again today.  Can increase gabapentin to 300 mg at bedtime.  After 2-3 weeks, can increase to 400mg  if not too sleepy in the mornings.  I will see you back in 3 months  Take care, Dr. Jacinto Reap

## 2015-09-13 NOTE — Progress Notes (Signed)
   Subjective:   SOLIA FAHL is a 70 y.o. female with a history of HTN, chronic low back pain, tobacco use disorder, insomnia here for insomnia follow-up  Insomnia - started gabapentin 100mg  qhs at visit 08/23/15 and increased to 200mg  qhs  - gets woken up by noise in apartments near her - wakes up feeling more rested in the mornings - feels as though she is having less leg pain too - no daytime fatigue  Review of Systems:  Per HPI.   Social History: current smoker - Reports much stress to consider quitting at this time  Objective:  BP 119/75 (BP Location: Left Arm, Patient Position: Sitting, Cuff Size: Normal)   Pulse 82   Temp 98.7 F (37.1 C) (Oral)   Wt 194 lb 6.4 oz (88.2 kg)   BMI 31.38 kg/m   Gen:  70 y.o. female in NAD HEENT: NCAT, MMM, EOMI, PERRL, anicteric sclerae CV: RRR, no MRG Resp: Non-labored, CTAB, no wheezes noted Ext: WWP, no edema MSK: No obvious deformities, gait intact Neuro: Alert and oriented, speech normal     Assessment & Plan:     BESSIE BUIKEMA is a 70 y.o. female here for   Insomnia Doing well on gabapentin 200 mg daily at bedtime Still awakening in the middle of the night but this is likely due to noise Increase gabapentin to 300 mg daily at bedtime and can consider increasing to 400 mg later Follow-up in 3 months      [name], MD MPH PGY-3,  [facility] 09/13/2015  5:21 PM

## 2015-09-13 NOTE — Assessment & Plan Note (Signed)
Doing well on gabapentin 200 mg daily at bedtime Still awakening in the middle of the night but this is likely due to noise Increase gabapentin to 300 mg daily at bedtime and can consider increasing to 400 mg later Follow-up in 3 months

## 2015-09-14 MED ORDER — DICLOFENAC SODIUM 75 MG PO TBEC: DELAYED_RELEASE_TABLET | ORAL | 2 refills | Status: DC

## 2015-09-14 NOTE — Telephone Encounter (Signed)
2nd request.  Kohl Polinsky L, RN  

## 2015-10-01 DIAGNOSIS — M1712 Unilateral primary osteoarthritis, left knee: Secondary | ICD-10-CM | POA: Diagnosis not present

## 2015-10-01 DIAGNOSIS — M1711 Unilateral primary osteoarthritis, right knee: Secondary | ICD-10-CM | POA: Diagnosis not present

## 2015-10-21 DIAGNOSIS — M47816 Spondylosis without myelopathy or radiculopathy, lumbar region: Secondary | ICD-10-CM | POA: Diagnosis not present

## 2015-11-04 DIAGNOSIS — M1711 Unilateral primary osteoarthritis, right knee: Secondary | ICD-10-CM | POA: Diagnosis not present

## 2015-11-04 DIAGNOSIS — M1712 Unilateral primary osteoarthritis, left knee: Secondary | ICD-10-CM | POA: Diagnosis not present

## 2015-11-11 ENCOUNTER — Other Ambulatory Visit: Payer: Self-pay | Admitting: *Deleted

## 2015-11-11 MED ORDER — METHOCARBAMOL 500 MG PO TABS: 500.0000 mg | ORAL_TABLET | Freq: Three times a day (TID) | ORAL | 1 refills | Status: DC | PRN

## 2015-11-17 ENCOUNTER — Ambulatory Visit (INDEPENDENT_AMBULATORY_CARE_PROVIDER_SITE_OTHER): Payer: Commercial Managed Care - HMO | Admitting: Family Medicine

## 2015-11-17 ENCOUNTER — Encounter: Payer: Self-pay | Admitting: Family Medicine

## 2015-11-17 VITALS — BP 131/89 | HR 94 | Temp 98.0°F | Ht 66.0 in | Wt 198.0 lb

## 2015-11-17 DIAGNOSIS — M549 Dorsalgia, unspecified: Secondary | ICD-10-CM

## 2015-11-17 DIAGNOSIS — R197 Diarrhea, unspecified: Secondary | ICD-10-CM | POA: Insufficient documentation

## 2015-11-17 DIAGNOSIS — Z23 Encounter for immunization: Secondary | ICD-10-CM | POA: Diagnosis not present

## 2015-11-17 HISTORY — DX: Dorsalgia, unspecified: M54.9

## 2015-11-17 MED ORDER — GABAPENTIN 300 MG PO CAPS: 300.0000 mg | ORAL_CAPSULE | Freq: Every day | ORAL | 3 refills | Status: DC

## 2015-11-17 NOTE — Assessment & Plan Note (Signed)
Chronic and intermittent Seems to be worsening and lasting longer now No hematochezia, melena, fevers suggesting infection or diverticulitis Referral to GI Needs colonoscopy also Return precautions discussed

## 2015-11-17 NOTE — Patient Instructions (Signed)

## 2015-11-17 NOTE — Assessment & Plan Note (Signed)
New referral placed to return to Dr Mina Marble Now with upper back pain in addition to lower back pain Epidural injections seem to be working

## 2015-11-17 NOTE — Progress Notes (Signed)
   Subjective:   Alexis Cole is a 70 y.o. female with a history of HTN, chronic low back pain, anxiety and depression here for diarrhea   Diarrhea - going on for 6 wks - has chronic intermittent loose stools related to anxiety - bad bloating and cramps with vegetables - eating meat - loose stools ~6 times per day  - needs to go to bathroom within 30-60 min after eating - pain in LLQ with eating - feels like gas pain per patient - has modified diet to help with bloating - mother and sister had diverticulitis - this concerns her - never had colonoscopy - blood on toilet paper once, no blood in toilet, no melena - no fevers/chills  Review of Systems:  Per HPI.   Social History: current smoker  Objective:  BP 131/89   Pulse 94   Temp 98 F (36.7 C) (Oral)   Ht 5\' 6"  (1.676 m)   Wt 198 lb (89.8 kg)   BMI 31.96 kg/m   Gen:  70 y.o. female in NAD, sitting comfortably HEENT: NCAT, MMM, EOMI, PERRL, anicteric sclerae CV: RRR, no MRG Resp: Non-labored, CTAB, no wheezes noted Abd: Soft, NTND, BS present, no guarding or organomegaly Ext: WWP, no edema MSK: Gait intact Neuro: Alert and oriented, speech normal    Assessment & Plan:     Alexis Cole is a 71 y.o. female here for   Back pain New referral placed to return to Dr Mina Marble Now with upper back pain in addition to lower back pain Epidural injections seem to be working  Diarrhea Chronic and intermittent Seems to be worsening and lasting longer now No hematochezia, melena, fevers suggesting infection or diverticulitis Referral to GI Needs colonoscopy also Return precautions discussed    Alexis Crews, MD MPH PGY-3,  [facility] 11/17/2015  11:17 AM  ;

## 2015-11-24 DIAGNOSIS — M791 Myalgia: Secondary | ICD-10-CM | POA: Diagnosis not present

## 2015-11-24 DIAGNOSIS — M47816 Spondylosis without myelopathy or radiculopathy, lumbar region: Secondary | ICD-10-CM | POA: Diagnosis not present

## 2015-11-29 ENCOUNTER — Other Ambulatory Visit: Payer: Self-pay | Admitting: Family Medicine

## 2015-12-24 ENCOUNTER — Telehealth: Payer: Self-pay | Admitting: Family Medicine

## 2015-12-24 NOTE — Telephone Encounter (Signed)
Pt is calling and needs a refill on her inhaler. She doesn't use this to often but she is getting over a chest cold and she is coughing and trouble breathing. Please send in a inhaler. Her pharmacy is correct in [place]

## 2015-12-27 NOTE — Telephone Encounter (Signed)
LMOVM for pt to call us back. Deseree Blount, CMA  

## 2015-12-27 NOTE — Telephone Encounter (Signed)
No inhaler on medication list.  Please confirm with patient that she does mean albuterol.  [name], MD, MPH PGY-3,  [facility] 12/27/2015 1:41 PM

## 2015-12-29 MED ORDER — ALBUTEROL SULFATE HFA 108 (90 BASE) MCG/ACT IN AERS: 2.0000 | INHALATION_SPRAY | Freq: Four times a day (QID) | RESPIRATORY_TRACT | 0 refills | Status: DC | PRN

## 2015-12-29 NOTE — Telephone Encounter (Signed)
Pt stated she took the inhaler to her pharmacist and got it to work. It was prescribed in 2014.  It was albuterol.  She would like to have another one in case she has this again.

## 2015-12-29 NOTE — Telephone Encounter (Signed)
Rx sent  [name], MD, MPH PGY-3,  [facility] 12/29/2015 1:52 PM

## 2015-12-30 ENCOUNTER — Other Ambulatory Visit: Payer: Self-pay | Admitting: Family Medicine

## 2016-01-05 ENCOUNTER — Other Ambulatory Visit: Payer: Self-pay | Admitting: Family Medicine

## 2016-01-05 MED ORDER — ALBUTEROL SULFATE HFA 108 (90 BASE) MCG/ACT IN AERS: 2.0000 | INHALATION_SPRAY | Freq: Four times a day (QID) | RESPIRATORY_TRACT | 0 refills | Status: DC | PRN

## 2016-01-05 NOTE — Telephone Encounter (Signed)
Pt would like a refill on Albuterol. [facility] [address]. Please advise. Thanks! ep

## 2016-01-12 ENCOUNTER — Ambulatory Visit (INDEPENDENT_AMBULATORY_CARE_PROVIDER_SITE_OTHER): Payer: Commercial Managed Care - HMO | Admitting: Family Medicine

## 2016-01-12 ENCOUNTER — Encounter: Payer: Self-pay | Admitting: Family Medicine

## 2016-01-12 ENCOUNTER — Encounter (INDEPENDENT_AMBULATORY_CARE_PROVIDER_SITE_OTHER): Payer: Commercial Managed Care - HMO | Admitting: Licensed Clinical Social Worker

## 2016-01-12 VITALS — BP 129/86 | HR 101 | Temp 98.2°F | Ht 66.0 in | Wt 199.0 lb

## 2016-01-12 DIAGNOSIS — F17200 Nicotine dependence, unspecified, uncomplicated: Secondary | ICD-10-CM | POA: Diagnosis not present

## 2016-01-12 DIAGNOSIS — F5101 Primary insomnia: Secondary | ICD-10-CM

## 2016-01-12 DIAGNOSIS — F341 Dysthymic disorder: Secondary | ICD-10-CM | POA: Diagnosis not present

## 2016-01-12 DIAGNOSIS — J0101 Acute recurrent maxillary sinusitis: Secondary | ICD-10-CM | POA: Diagnosis not present

## 2016-01-12 MED ORDER — AMOXICILLIN-POT CLAVULANATE 875-125 MG PO TABS: 1.0000 | ORAL_TABLET | Freq: Two times a day (BID) | ORAL | 0 refills | Status: DC

## 2016-01-12 MED ORDER — GABAPENTIN 300 MG PO CAPS: 600.0000 mg | ORAL_CAPSULE | Freq: Every day | ORAL | 3 refills | Status: DC

## 2016-01-12 MED ORDER — BUSPIRONE HCL 10 MG PO TABS: 10.0000 mg | ORAL_TABLET | Freq: Two times a day (BID) | ORAL | 3 refills | Status: DC

## 2016-01-12 NOTE — Progress Notes (Signed)
Subjective:   Alexis Cole is a 70 y.o. female with a history of Anxiety, insomnia, recurrent sinusitis, tobacco use here for sinus congestion  Insomnia - taking gabapentin 300mg  qhs - awake about q2h overnight - doesn't feel well rested - sleeps alone, so unsure if she snores - tried amytriptyline and nortriptyline previously - which made her depressed  Sinus congestion - ongoing for 2.5-3 wks - out of work for 10 days with N/V/D - now resolved - facial pain over entire face - was febrile to 101 at home - associated with SOB, fatigue (feels done with day at 2pm) - never seen ENT, but gets sinus infections about 2x/yr - hasnt tried any OTC meds except flonase - has not felt this bad since she had mumps as a child  Anxiety - Feels as though this worsened when she was sick because she was feeling phone or a ball - She also worries a lot because there are strange people around her apartment and she knows that there is a crack dealer living next door to her - She doesn't feel safe or she lives - She is taking BuSpar 7.5 mg twice a day and wonders if she can go up on this dose - Denies any SI/HI  Review of Systems:  Per HPI.   Social History: Current smoker - has cut back from half a pack a day to 3 cigarettes per day  Objective:  BP 129/86   Pulse (!) 101   Temp 98.2 F (36.8 C) (Oral)   Ht 5\' 6"  (1.676 m)   Wt 199 lb (90.3 kg)   BMI 32.12 kg/m   Gen:  70 y.o. female in NAD, Intermittently tearful HEENT: NCAT, MMM, EOMI, PERRL, anicteric sclerae, oh peak clear, TMs clear bilaterally, tenderness to palpation over maxillary and frontal sinuses Neck: No LAD CV: RRR, no MRG Resp: Non-labored, CTAB, no wheezes noted Abd: Soft, NTND, BS present, no guarding or organomegaly Ext: WWP, no edema MSK: No obvious deformities, gait intact Neuro: Alert and oriented, speech normal Psych: Tearful, no pressured speech, no evidence of AVH, no SI/HI, appropriate grooming and  dress       Chemistry      Component Value Date/Time   NA 138 04/21/2015 1051   K 4.1 04/21/2015 1051   CL 104 04/21/2015 1051   CO2 25 04/21/2015 1051   BUN 21 04/21/2015 1051   CREATININE 0.69 04/21/2015 1051      Component Value Date/Time   CALCIUM 10.2 04/21/2015 1051   ALKPHOS 82 05/07/2014 1430   AST 17 05/07/2014 1430   ALT 22 05/07/2014 1430   BILITOT 0.3 05/07/2014 1430      Lab Results  Component Value Date   WBC 8.5 05/28/2014   HGB 14.0 05/28/2014   HCT 41.6 05/28/2014   MCV 90.2 05/28/2014   PLT 285 05/28/2014   Lab Results  Component Value Date   TSH 1.098 05/07/2014   Lab Results  Component Value Date   HGBA1C 5.9 01/01/2015   Assessment & Plan:     Alexis Cole is a 70 y.o. female here for   Acute recurrent sinusitis Treat acute sinusitis with 10 day course of Augmentin Continue Claritin and Flonase daily Referral to ENT as this is a recurrent problem for this patient  Tobacco use disorder Encourage smoking cessation as this is contributing to her recurrent sinusitis  Insomnia Likely multifactorial related to poor living conditions, anxiety, sinusitis, and other factors Increase gabapentin  from 300-600 mg daily at bedtime  ANXIETY DEPRESSION Denies depressive symptoms including SI Increase seems situational with recent illness and unsafe living environment Increase BuSpar to 10 mg twice a day Social work consult today with resources given Continue to talk with social work     [name], MD MPH PGY-3,  [facility] 01/12/2016  12:09 PM

## 2016-01-12 NOTE — Progress Notes (Signed)
Warm handoff   SUBJECTIVE: Alexis Cole is a 70 y.o. female  referred by Dr. Brita Romp for:  anxiety and social stressors. Discussed services offered by [facility], patient appreciative of support offered, verbal consent received.  Pt. reports the following symptoms/concerns anxiety and sleep disturbance.: concerns with illegal activity from neighbors.    Duration of problem: past several months Previous treatment/Famly HX: currently taking gabapentin for sleep and buspar for anxiety  LIFE CONTEXT:  Living situation: living in current housing 10 years. Lives with her dog.  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  Life changes: neighbors causing stress, per patient due to increase in people in and out of their house.  What is important to pt (values): her dog.  THE FOLLOW WAS DISCUSSED:Current stressors, past, current and new coping skills,   GOALS ADDRESSED:  managing stress and relaxation  INTERVENTIONS UTILIZED TODAY: Problem-solving teaching/coping strategies   psycho-education, Reflective listening, Relaxation and Meditation.    ASSESSMENT:  Pt currently experiencing stress. Symptoms exacerbated by psychosocial stressors with her neighbors . Patient will work on the following: progressive mussle relaxation and relaxed breathing.  Worksheets provided for both.    Referral:[facility]  With [employer].     PLAN: 1. LCSW will F/U with patient via phone call in one week.  2. Patient is in agreement to implement interventions provided:  3. Patient will call [employer] to find out her housing options.   Casimer Lanius, LCSW Licensed Clinical Social Worker [place]   [phone] 12:12 PM

## 2016-01-12 NOTE — Assessment & Plan Note (Signed)
Likely multifactorial related to poor living conditions, anxiety, sinusitis, and other factors Increase gabapentin from 300-600 mg daily at bedtime

## 2016-01-12 NOTE — Assessment & Plan Note (Signed)
Encourage smoking cessation as this is contributing to her recurrent sinusitis

## 2016-01-12 NOTE — Assessment & Plan Note (Signed)
Treat acute sinusitis with 10 day course of Augmentin Continue Claritin and Flonase daily Referral to ENT as this is a recurrent problem for this patient

## 2016-01-12 NOTE — Assessment & Plan Note (Addendum)
Denies depressive symptoms including SI Increase seems situational with recent illness and unsafe living environment Increase BuSpar to 10 mg twice a day Social work consult today with resources given Continue to talk with social work

## 2016-01-12 NOTE — Patient Instructions (Signed)
Nice to see you again today. I'm sorry that things are rough right now. I hope that Neoma Laming our Education officer, museum can help in any way possible.  You have a sinus infection today. We will treat with 10 days of Augmentin twice daily. I also put in a referral to the ear nose and throat doctors.  Increase her gabapentin to 600 mg daily at bedtime for insomnia. You can increase her BuSpar to 10 mg twice daily for anxiety.  I would like to see you back in one month since we've increased these medications.  Take care, Dr. Jacinto Reap

## 2016-01-18 ENCOUNTER — Encounter: Payer: Self-pay | Admitting: Family Medicine

## 2016-01-19 ENCOUNTER — Telehealth: Payer: Self-pay | Admitting: Licensed Clinical Social Worker

## 2016-01-19 ENCOUNTER — Telehealth: Payer: Self-pay | Admitting: Family Medicine

## 2016-01-19 DIAGNOSIS — F5101 Primary insomnia: Secondary | ICD-10-CM

## 2016-01-19 MED ORDER — GABAPENTIN 400 MG PO CAPS: 400.0000 mg | ORAL_CAPSULE | Freq: Every day | ORAL | 2 refills | Status: DC

## 2016-01-19 NOTE — Telephone Encounter (Signed)
Can decrease dose from 600 mg to 400 mg daily at bedtime. Doubt gabapentin is causing insomnia, but could be causing excess fatigue in the morning.  New Rx for 400 mg capsule sent to pharmacy.  [name], MD, MPH PGY-3,  [facility] 01/19/2016 11:44 AM

## 2016-01-19 NOTE — Progress Notes (Signed)
Follow up call from [place] consult on 01/12/16.   Patient reports her neighbors have been quieter which has resulted in better sleep.  Reports she feels better overall and has resumed meditation, which helps with her anxiety.  Patient states " I still feel like I am fighting with my anxiety all the time".    Patient would like LCSW to share with PCP that she wants to see a psychiatrist.  Dr. Brita Romp notified of patient request and is in agreement for LCSW to provide information to patient.  Called patient to provide an update.   Plan:  LCSW will mail patient a list of providers that offers medication management and therapy.   Patient will review the list and contact her insurance provider. Patient's next appointment with [facility] is 02/24/15.  Casimer Lanius, LCSW Licensed Clinical Social Worker [facility]   [phone] 12:23 PM

## 2016-01-19 NOTE — Telephone Encounter (Signed)
Gabapentin was doubled, but it is too much for pt. Causing insomnia. Pt would like to go down to 400 mg. Pt would like to have a [facility] [address]. Please advise. Thanks! ep

## 2016-01-24 DIAGNOSIS — M47816 Spondylosis without myelopathy or radiculopathy, lumbar region: Secondary | ICD-10-CM | POA: Diagnosis not present

## 2016-01-24 DIAGNOSIS — F17210 Nicotine dependence, cigarettes, uncomplicated: Secondary | ICD-10-CM | POA: Diagnosis not present

## 2016-01-24 DIAGNOSIS — Z716 Tobacco abuse counseling: Secondary | ICD-10-CM | POA: Diagnosis not present

## 2016-01-25 DIAGNOSIS — M1712 Unilateral primary osteoarthritis, left knee: Secondary | ICD-10-CM | POA: Diagnosis not present

## 2016-01-25 DIAGNOSIS — M1711 Unilateral primary osteoarthritis, right knee: Secondary | ICD-10-CM | POA: Diagnosis not present

## 2016-02-08 DIAGNOSIS — M47816 Spondylosis without myelopathy or radiculopathy, lumbar region: Secondary | ICD-10-CM | POA: Diagnosis not present

## 2016-02-14 ENCOUNTER — Other Ambulatory Visit: Payer: Self-pay | Admitting: Family Medicine

## 2016-02-24 ENCOUNTER — Ambulatory Visit (INDEPENDENT_AMBULATORY_CARE_PROVIDER_SITE_OTHER): Payer: Commercial Managed Care - HMO | Admitting: Family Medicine

## 2016-02-24 ENCOUNTER — Encounter: Payer: Self-pay | Admitting: Family Medicine

## 2016-02-24 VITALS — BP 136/88 | HR 98 | Temp 97.9°F | Ht 66.0 in | Wt 197.0 lb

## 2016-02-24 DIAGNOSIS — F341 Dysthymic disorder: Secondary | ICD-10-CM

## 2016-02-24 DIAGNOSIS — R5382 Chronic fatigue, unspecified: Secondary | ICD-10-CM

## 2016-02-24 DIAGNOSIS — R5383 Other fatigue: Secondary | ICD-10-CM | POA: Insufficient documentation

## 2016-02-24 DIAGNOSIS — F5101 Primary insomnia: Secondary | ICD-10-CM | POA: Diagnosis not present

## 2016-02-24 HISTORY — DX: Other fatigue: R53.83

## 2016-02-24 MED ORDER — CITALOPRAM HYDROBROMIDE 10 MG PO TABS: 10.0000 mg | ORAL_TABLET | Freq: Every day | ORAL | 0 refills | Status: DC

## 2016-02-24 MED ORDER — GABAPENTIN 300 MG PO CAPS: 300.0000 mg | ORAL_CAPSULE | Freq: Every day | ORAL | 0 refills | Status: DC

## 2016-02-24 NOTE — Assessment & Plan Note (Signed)
Likely multifactorial related to poor living conditions, anxiety, and other factors Continue gabapentin 300 mg daily at bedtime Treat anxiety as below

## 2016-02-24 NOTE — Assessment & Plan Note (Signed)
Likely multifactorial related to anxiety, depression, insomnia Check vitamin D level today

## 2016-02-24 NOTE — Assessment & Plan Note (Signed)
Significant scores on depression and anxiety testing today Denies SI Recent increase in symptoms seem situational with unsafe living environment Patient has been followed by behavioral health consults in clinic Continue BuSpar 10 mg twice daily Trial of Celexa 10 mg daily Start with low-dose as patient has had SI on a different SSRI in the past Follow-up in 2 weeks Discussed safety measures in case of any SI

## 2016-02-24 NOTE — Patient Instructions (Signed)
Nice to see you again today. We will start a new medicine for anxiety that you can take with the BuSpar called Celexa. Take this once daily.  We will check a vitamin D level today to see if that is intermittent your fatigue. Someone will call you or send you a letter with the results when they're available.  Please call the ENT office back at [phone] to schedule an appointment  I will see you back in 2 weeks to follow-up on the new medication. I'll also renew your orthopedic referrals.  Take care, Dr. Jacinto Reap

## 2016-02-24 NOTE — Progress Notes (Signed)
   Subjective:   Alexis Cole is a 71 y.o. female with a history of Anxiety, insomnia, recurrent sinusitis, tobacco use here for anxiety follow-up  Anxiety  - Taking buspar 10 BID (but taking 5 QID) - Bad, especially in the morning Scared in the night about neighbors noise Doesn't feel safe where she lives Call security regularly In the past, Paxil made her feel suicidal No SI/HI currently Willing to try a different SSRI  Insomnia Stayed on gabapentin 300mg  qhs as 400 mg was too sedating Doesn't feel well rested Wakes up about every 2 hours overnight due to her neighbors being very noisy Sleeps alone so unsure if she snores Tried amitriptyline and nortriptyline previously which made her depressed Is better on the gabapentin that she was previously  Review of Systems:  Per HPI.   Social History: Current smoker  Objective:  BP 136/88   Pulse 98   Temp 97.9 F (36.6 C) (Oral)   Ht 5\' 6"  (1.676 m)   Wt 197 lb (89.4 kg)   SpO2 98%   BMI 31.80 kg/m   Gen:  71 y.o. female in NAD HEENT: NCAT, MMM, EOMI, PERRL, anicteric sclerae CV: RRR, no MRG Resp: Non-labored, CTAB, no wheezes noted Ext: WWP, no edema MSK: No obvious deformities, gait intact Neuro: Alert and oriented, speech normal Psych: Speech normal and nonpressured, no evidence of AVH, appropriate grooming and dress, no SI/HI  PHQ9: 20, scored 0 on question 9, very difficult GAD7: 16, very difficult       Chemistry      Component Value Date/Time   NA 138 04/21/2015 1051   K 4.1 04/21/2015 1051   CL 104 04/21/2015 1051   CO2 25 04/21/2015 1051   BUN 21 04/21/2015 1051   CREATININE 0.69 04/21/2015 1051      Component Value Date/Time   CALCIUM 10.2 04/21/2015 1051   ALKPHOS 82 05/07/2014 1430   AST 17 05/07/2014 1430   ALT 22 05/07/2014 1430   BILITOT 0.3 05/07/2014 1430      Lab Results  Component Value Date   WBC 8.5 05/28/2014   HGB 14.0 05/28/2014   HCT 41.6 05/28/2014   MCV 90.2 05/28/2014     PLT 285 05/28/2014   Lab Results  Component Value Date   TSH 1.098 05/07/2014   Lab Results  Component Value Date   HGBA1C 5.9 01/01/2015   Assessment & Plan:     Alexis Cole is a 71 y.o. female here for   Insomnia Likely multifactorial related to poor living conditions, anxiety, and other factors Continue gabapentin 300 mg daily at bedtime Treat anxiety as below  Fatigue Likely multifactorial related to anxiety, depression, insomnia Check vitamin D level today  ANXIETY DEPRESSION Significant scores on depression and anxiety testing today Denies SI Recent increase in symptoms seem situational with unsafe living environment Patient has been followed by behavioral health consults in clinic Continue BuSpar 10 mg twice daily Trial of Celexa 10 mg daily Start with low-dose as patient has had SI on a different SSRI in the past Follow-up in 2 weeks Discussed safety measures in case of any SI     [name], MD MPH PGY-3,  [facility] 02/24/2016  5:14 PM

## 2016-02-25 ENCOUNTER — Telehealth: Payer: Self-pay | Admitting: Family Medicine

## 2016-02-25 LAB — VITAMIN D 25 HYDROXY (VIT D DEFICIENCY, FRACTURES): Vit D, 25-Hydroxy: 21 ng/mL — ABNORMAL LOW (ref 30–100)

## 2016-02-25 MED ORDER — CITALOPRAM HYDROBROMIDE 10 MG PO TABS: 10.0000 mg | ORAL_TABLET | Freq: Every day | ORAL | 0 refills | Status: DC

## 2016-02-25 NOTE — Telephone Encounter (Signed)
Pt's Rx went to the wrong pharmacy, [facility] Pt would also like ENT referral to go to Dr. Ernesto Rutherford. ep

## 2016-02-25 NOTE — Telephone Encounter (Signed)
[facility].  Derl Barrow, RN

## 2016-02-26 ENCOUNTER — Telehealth: Payer: Self-pay | Admitting: Family Medicine

## 2016-02-26 MED ORDER — VITAMIN D (ERGOCALCIFEROL) 1.25 MG (50000 UNIT) PO CAPS: 50000.0000 [IU] | ORAL_CAPSULE | ORAL | 1 refills | Status: DC

## 2016-02-26 NOTE — Telephone Encounter (Signed)
Called patient to discuss low Vit D level.  No answer.  VM states it is for Alexis Cole.  Left generic voicemail asking [name] to call [facility] back during weekly.  If patient returns call, please forward the following message:  Weekly Vit D 50K supplementation x8 weeks and then recheck Vit D level and decide on further supplementation.  [facility]  [name], MD, MPH PGY-3,  [facility] 02/26/2016 9:16 AM

## 2016-02-26 NOTE — Telephone Encounter (Signed)
ENT referral was already processed last visit.  Will forward to [place] to check that it went to Dr requested.  [name], MD, MPH PGY-3,  [facility] 02/26/2016 9:12 AM

## 2016-02-28 NOTE — Telephone Encounter (Signed)
This was a new request for Dr. Ernesto Rutherford.  She was going to call [facility] back after last visit.  Thanks! [name], MD, MPH PGY-3,  [facility] 02/28/2016 11:26 AM

## 2016-02-28 NOTE — Telephone Encounter (Signed)
ENT referral was processed and sent to [facility] in Nov, no documentation stating patient wanted to see Dr. Ernesto Rutherford at that time. I will re-do referral and send it to Dr. Ernesto Rutherford.

## 2016-02-29 NOTE — Telephone Encounter (Signed)
Pt would like to know when referral for Dr. Ernesto Rutherford will be sent. ep

## 2016-03-01 NOTE — Telephone Encounter (Signed)
Their office will call patient to schedule. Patients must give our office 2 weeks to process referrals.

## 2016-03-02 ENCOUNTER — Telehealth: Payer: Self-pay

## 2016-03-02 NOTE — Telephone Encounter (Signed)
Pt called in to see who had called her and I told her that I did not see anything recently and the last note I saw was about her Vit D and she said that she spoke to someone about this already. Alexis Cole, CMA

## 2016-03-06 DIAGNOSIS — J3081 Allergic rhinitis due to animal (cat) (dog) hair and dander: Secondary | ICD-10-CM | POA: Diagnosis not present

## 2016-03-06 DIAGNOSIS — J320 Chronic maxillary sinusitis: Secondary | ICD-10-CM | POA: Diagnosis not present

## 2016-03-06 DIAGNOSIS — J40 Bronchitis, not specified as acute or chronic: Secondary | ICD-10-CM | POA: Diagnosis not present

## 2016-03-06 DIAGNOSIS — J322 Chronic ethmoidal sinusitis: Secondary | ICD-10-CM | POA: Diagnosis not present

## 2016-03-06 DIAGNOSIS — J039 Acute tonsillitis, unspecified: Secondary | ICD-10-CM | POA: Diagnosis not present

## 2016-03-06 DIAGNOSIS — J040 Acute laryngitis: Secondary | ICD-10-CM | POA: Diagnosis not present

## 2016-03-06 DIAGNOSIS — J029 Acute pharyngitis, unspecified: Secondary | ICD-10-CM | POA: Diagnosis not present

## 2016-03-06 DIAGNOSIS — J301 Allergic rhinitis due to pollen: Secondary | ICD-10-CM | POA: Diagnosis not present

## 2016-03-14 ENCOUNTER — Ambulatory Visit: Payer: Self-pay | Admitting: Family Medicine

## 2016-03-15 ENCOUNTER — Ambulatory Visit
Admission: RE | Admit: 2016-03-15 | Discharge: 2016-03-15 | Disposition: A | Discharge status: Home / Self Care | Payer: Commercial Managed Care - HMO | Source: Ambulatory Visit | Attending: Otolaryngology | Admitting: Otolaryngology

## 2016-03-15 ENCOUNTER — Other Ambulatory Visit: Payer: Self-pay | Admitting: Otolaryngology

## 2016-03-15 DIAGNOSIS — J189 Pneumonia, unspecified organism: Secondary | ICD-10-CM

## 2016-03-15 DIAGNOSIS — J320 Chronic maxillary sinusitis: Secondary | ICD-10-CM | POA: Diagnosis not present

## 2016-03-15 DIAGNOSIS — J40 Bronchitis, not specified as acute or chronic: Secondary | ICD-10-CM

## 2016-03-15 DIAGNOSIS — J322 Chronic ethmoidal sinusitis: Secondary | ICD-10-CM | POA: Diagnosis not present

## 2016-03-15 DIAGNOSIS — R0989 Other specified symptoms and signs involving the circulatory and respiratory systems: Secondary | ICD-10-CM | POA: Diagnosis not present

## 2016-03-15 DIAGNOSIS — J040 Acute laryngitis: Secondary | ICD-10-CM | POA: Diagnosis not present

## 2016-03-15 HISTORY — DX: Pneumonia, unspecified organism: J18.9

## 2016-03-20 ENCOUNTER — Encounter: Payer: Self-pay | Admitting: Family Medicine

## 2016-03-20 ENCOUNTER — Ambulatory Visit (INDEPENDENT_AMBULATORY_CARE_PROVIDER_SITE_OTHER): Payer: Commercial Managed Care - HMO | Admitting: Family Medicine

## 2016-03-20 ENCOUNTER — Other Ambulatory Visit: Payer: Self-pay | Admitting: *Deleted

## 2016-03-20 VITALS — BP 164/90 | HR 100 | Temp 98.3°F | Ht 66.0 in | Wt 198.0 lb

## 2016-03-20 DIAGNOSIS — I10 Essential (primary) hypertension: Secondary | ICD-10-CM | POA: Diagnosis not present

## 2016-03-20 DIAGNOSIS — F341 Dysthymic disorder: Secondary | ICD-10-CM | POA: Diagnosis not present

## 2016-03-20 DIAGNOSIS — F17200 Nicotine dependence, unspecified, uncomplicated: Secondary | ICD-10-CM

## 2016-03-20 DIAGNOSIS — G4700 Insomnia, unspecified: Secondary | ICD-10-CM | POA: Diagnosis not present

## 2016-03-20 MED ORDER — CITALOPRAM HYDROBROMIDE 20 MG PO TABS: 20.0000 mg | ORAL_TABLET | Freq: Every day | ORAL | 2 refills | Status: DC

## 2016-03-20 MED ORDER — DICLOFENAC SODIUM 75 MG PO TBEC: DELAYED_RELEASE_TABLET | ORAL | 2 refills | Status: DC

## 2016-03-20 NOTE — Progress Notes (Signed)
Subjective:   Alexis Cole is a 71 y.o. female with a history of Anxiety, insomnia, recurrent sinusitis, tobacco use here for anxiety follow-up  Anxiety - Taking buspar 10 BID (but taking 5 QID) - Bad, especially in the morning  - Scared in the night about neighbors noise, Doesn't feel safe where she lives, Call security regularly - In the past, Paxil made her feel suicidal No SI/HI currently - Seen 02/24/16 and started on Celexa 10 mg daily - Patient reports that she was only able to start Celexa last week and hasnt noticed a difference yet  Insomnia Stayed on gabapentin 300mg  qhs as 400 mg was too sedating Doesn't feel well rested - thinks it may be related to pneumonia Wakes up about every 2 hours overnight due to her neighbors being very noisy Sleeps alone so unsure if she snores Tried amitriptyline and nortriptyline previously which made her depressed Is better on the gabapentin that she was previously  Recently diagnosed with left lower lobe pneumonia ENT and treated with doxycycline Fevers have resolved, but she feels like her breath is not reaching the bottom of her left lung yet Cough is resolved, but she is still fatigued  HTN: - Medications: HCTZ 25 mg daily, lisinopril 20 mg daily - Compliance: good - wasn't able to take this AM - Checking BP at home: No - Denies any SOB, CP, vision changes, LE edema, medication SEs, or symptoms of hypotension - Diet: eats salty foods sometimes - Exercise: none   Review of Systems:  Per HPI.   Social History: current smoker - decreased by 1 cig per day  Objective:  BP (!) 164/90   Pulse 100   Temp 98.3 F (36.8 C) (Oral)   Ht 5\' 6"  (1.676 m)   Wt 198 lb (89.8 kg)   BMI 31.96 kg/m   Gen:  71 y.o. female in NAD HEENT: NCAT, MMM, EOMI, PERRL, anicteric sclerae, OP clear CV: RRR, no MRG Resp: Non-labored, slightly diminished in L base Abd: Soft, NTND, BS present, no guarding or organomegaly Ext: WWP, no edema MSK: No  obvious deformities, gait intact Neuro: Alert and oriented, speech normal Psych: Speech normal and nonpressured, no evidence of AVH, appropriate grooming and dress, no SI/HI  PHQ9: 17, scored 0 on question 9, very difficult Previously: 20, scored 0 on question 9, very difficult GAD7: 18, very difficult Previously: 16, very difficult       Chemistry      Component Value Date/Time   NA 138 04/21/2015 1051   K 4.1 04/21/2015 1051   CL 104 04/21/2015 1051   CO2 25 04/21/2015 1051   BUN 21 04/21/2015 1051   CREATININE 0.69 04/21/2015 1051      Component Value Date/Time   CALCIUM 10.2 04/21/2015 1051   ALKPHOS 82 05/07/2014 1430   AST 17 05/07/2014 1430   ALT 22 05/07/2014 1430   BILITOT 0.3 05/07/2014 1430      Lab Results  Component Value Date   WBC 8.5 05/28/2014   HGB 14.0 05/28/2014   HCT 41.6 05/28/2014   MCV 90.2 05/28/2014   PLT 285 05/28/2014   Lab Results  Component Value Date   TSH 1.098 05/07/2014   Lab Results  Component Value Date   HGBA1C 5.9 01/01/2015   Assessment & Plan:     Alexis Cole is a 71 y.o. female here for   Essential hypertension, benign Uncontrolled Patient has not taken her medications yet today however Previously well-controlled  on current medications Follow-up in one month  Tobacco use disorder Is decreasing intake Encourage patient to continue with smoking cessation efforts as this is contributing to her recurrent sinusitis and the recent pneumonia   Insomnia Likely multifactorial related to poor living conditions, anxiety, depression, and other factors Continue gabapentin 300 mg daily at bedtime as this has been helping Treat anxiety as below Can consider sleep study in several months if not improved after anxiety and depression well managed  ANXIETY DEPRESSION Significant scored on depression and anxiety testing today that are similar to previous scores Denies SI or HI Recent increase in symptoms seem situational  with unsafe living environment Continue BuSpar 10 mg twice a day Continue Celexa 10 mg daily for another 1-2 weeks Advised patient that she can increase Celexa dose to 20 mg daily after 1-2 weeks if she is doing well She should stop medication and seek medical care immediately if she develops suicidal ideation Follow-up in one month   Needs repeat chest x-ray in one month to ensure resolution and no malignancy  [name], MD MPH PGY-3,  [facility] 03/20/2016  4:41 PM

## 2016-03-20 NOTE — Assessment & Plan Note (Signed)
Likely multifactorial related to poor living conditions, anxiety, depression, and other factors Continue gabapentin 300 mg daily at bedtime as this has been helping Treat anxiety as below Can consider sleep study in several months if not improved after anxiety and depression well managed

## 2016-03-20 NOTE — Assessment & Plan Note (Signed)
Significant scored on depression and anxiety testing today that are similar to previous scores Denies SI or HI Recent increase in symptoms seem situational with unsafe living environment Continue BuSpar 10 mg twice a day Continue Celexa 10 mg daily for another 1-2 weeks Advised patient that she can increase Celexa dose to 20 mg daily after 1-2 weeks if she is doing well She should stop medication and seek medical care immediately if she develops suicidal ideation Follow-up in one month

## 2016-03-20 NOTE — Assessment & Plan Note (Signed)
Is decreasing intake Encourage patient to continue with smoking cessation efforts as this is contributing to her recurrent sinusitis and the recent pneumonia

## 2016-03-20 NOTE — Patient Instructions (Addendum)
Nice to see you again today.  Try to continue Celexa at current dose for 1-2 more weeks.  If you have any suicidal thoughts, please stop the medication and seek medical care. If all is going well at that time, you can increase her Celexa to 20 mg daily. I will see you back in one month to follow-up on this. Please call the clinic and update me if you change your medication.  We will get a repeat chest XRay at that time  Take care, Dr. Jacinto Reap

## 2016-03-20 NOTE — Assessment & Plan Note (Signed)
Uncontrolled Patient has not taken her medications yet today however Previously well-controlled on current medications Follow-up in one month

## 2016-03-21 ENCOUNTER — Other Ambulatory Visit: Payer: Self-pay | Admitting: *Deleted

## 2016-03-21 NOTE — Telephone Encounter (Signed)
Refill request for 90 day supply.  Martin, Tamika L, RN  

## 2016-03-23 MED ORDER — BUSPIRONE HCL 10 MG PO TABS: 10.0000 mg | ORAL_TABLET | Freq: Two times a day (BID) | ORAL | 3 refills | Status: DC

## 2016-03-26 ENCOUNTER — Other Ambulatory Visit: Payer: Self-pay | Admitting: Family Medicine

## 2016-03-29 ENCOUNTER — Other Ambulatory Visit: Payer: Self-pay | Admitting: *Deleted

## 2016-03-29 DIAGNOSIS — I10 Essential (primary) hypertension: Secondary | ICD-10-CM

## 2016-03-29 DIAGNOSIS — J320 Chronic maxillary sinusitis: Secondary | ICD-10-CM | POA: Diagnosis not present

## 2016-03-29 DIAGNOSIS — J040 Acute laryngitis: Secondary | ICD-10-CM | POA: Diagnosis not present

## 2016-03-29 DIAGNOSIS — J322 Chronic ethmoidal sinusitis: Secondary | ICD-10-CM | POA: Diagnosis not present

## 2016-03-29 MED ORDER — LISINOPRIL 20 MG PO TABS: 20.0000 mg | ORAL_TABLET | Freq: Every day | ORAL | 3 refills | Status: DC

## 2016-03-29 MED ORDER — HYDROCHLOROTHIAZIDE 25 MG PO TABS: 25.0000 mg | ORAL_TABLET | Freq: Every day | ORAL | 3 refills | Status: DC

## 2016-04-12 ENCOUNTER — Ambulatory Visit (HOSPITAL_COMMUNITY)
Admission: RE | Admit: 2016-04-12 | Discharge: 2016-04-12 | Disposition: A | Discharge status: Home / Self Care | Payer: Medicare HMO | Source: Ambulatory Visit | Attending: Family Medicine | Admitting: Family Medicine

## 2016-04-12 ENCOUNTER — Ambulatory Visit (INDEPENDENT_AMBULATORY_CARE_PROVIDER_SITE_OTHER): Payer: Medicare HMO | Admitting: Family Medicine

## 2016-04-12 ENCOUNTER — Telehealth: Payer: Self-pay | Admitting: Family Medicine

## 2016-04-12 VITALS — BP 125/80 | HR 102 | Temp 98.1°F | Ht 66.0 in | Wt 192.2 lb

## 2016-04-12 DIAGNOSIS — F17210 Nicotine dependence, cigarettes, uncomplicated: Secondary | ICD-10-CM | POA: Diagnosis not present

## 2016-04-12 DIAGNOSIS — F341 Dysthymic disorder: Secondary | ICD-10-CM | POA: Diagnosis not present

## 2016-04-12 DIAGNOSIS — R9431 Abnormal electrocardiogram [ECG] [EKG]: Secondary | ICD-10-CM | POA: Insufficient documentation

## 2016-04-12 DIAGNOSIS — Z716 Tobacco abuse counseling: Secondary | ICD-10-CM | POA: Diagnosis not present

## 2016-04-12 DIAGNOSIS — R55 Syncope and collapse: Secondary | ICD-10-CM

## 2016-04-12 DIAGNOSIS — Z789 Other specified health status: Secondary | ICD-10-CM | POA: Diagnosis not present

## 2016-04-12 DIAGNOSIS — M47816 Spondylosis without myelopathy or radiculopathy, lumbar region: Secondary | ICD-10-CM | POA: Diagnosis not present

## 2016-04-12 DIAGNOSIS — M791 Myalgia: Secondary | ICD-10-CM | POA: Diagnosis not present

## 2016-04-12 DIAGNOSIS — E785 Hyperlipidemia, unspecified: Secondary | ICD-10-CM | POA: Diagnosis not present

## 2016-04-12 DIAGNOSIS — R69 Illness, unspecified: Secondary | ICD-10-CM | POA: Diagnosis not present

## 2016-04-12 HISTORY — DX: Syncope and collapse: R55

## 2016-04-12 NOTE — Assessment & Plan Note (Addendum)
Likely multifactorial Could be related to anxiety Sounds orthostatic and orthostatic vital signs in clinic show a lying blood pressure 110/80, pulse 93, sitting blood pressure 100/80, pulse 94, standing blood pressure 80/60, pulse 104 No cardiogenic symptoms and EKG shows incomplete right bundle branch block, normal sinus rhythm, nonischemic, no previous EKG to compare to Check TSH, CMP, CBC We will stop Celexa as patient is convinced this is related to her syncope Hold HCTZ for orthostatic hypotension until follow-up Advised her to stay well-hydrated She has follow-up appointment in 2 weeks with me and we will follow-up with presyncope at that time

## 2016-04-12 NOTE — Assessment & Plan Note (Signed)
Check hepatitis C screening Not a candidate for low-dose chest CT as patient has less than 35-pack-year history of smoking

## 2016-04-12 NOTE — Assessment & Plan Note (Signed)
Denies SI or HI Recent increase in symptoms seem situational with unsafe living environment Continue BuSpar 10 mg twice daily Stop Celexa as this caused much GI upset Celexa could be related to weight loss due to GI upset

## 2016-04-12 NOTE — Progress Notes (Signed)
Subjective:   Alexis Cole is a 71 y.o. female with a history of Anxiety, insomnia, recurrent sinusitis, tobacco use here for presyncope  Patient was at work yesterday afternoon sitting in a chair. When she stood up, she suddenly felt dizzy and her vision blacked out. She shuffled outside and sat down spoke to cigarettes. After 5 minutes she seemed to feel better. She denies any shortness of breath, chest pains, palpitations, loss of consciousness, fall. She believes this is related to Celexa as she is been feeling "weird" ever since going up to 20 mg daily of her Celexa. She has stopped this for the last 2 days. Nothing like this is ever happened before. She is frustrated because she cannot go back to work until she sees Dr. and has improved. She thinks that she is staying hydrated, but she has not eaten much for the past 2 weeks due to nausea. She often falls asleep before dinner and feels nauseous after only 2 bites of her dinner.  Started celexa 10mg  dailyOn 02/24/16 Increase to 20 mg daily on 03/20/16 Also is taking BuSpar 10 mg twice daily Has long-standing fatigue that is likely multifactorial related to anxiety, depression, insomnia Was also recently started on gabapentin 300 mg daily at bedtime within the last few months for her insomnia  Also note the patient has lost 6 pounds in the last month without effort.  Review of Systems:  Per HPI.   Social History: Current smoker  Objective:  BP 125/80 (BP Location: Left Arm, Patient Position: Sitting, Cuff Size: Normal)   Pulse (!) 102   Temp 98.1 F (36.7 C) (Oral)   Ht 5\' 6"  (1.676 m)   Wt 192 lb 3.2 oz (87.2 kg)   SpO2 95%   BMI 31.02 kg/m   Gen:  71 y.o. female in NAD HEENT: NCAT, MMM, EOMI, PERRL, anicteric sclerae CV: RRR, no MRG Resp: Non-labored, CTAB, no wheezes noted Abd: Soft, NTND, BS present, no guarding or organomegaly Ext: WWP, no edema MSK: No obvious deformities, gait intact Neuro: Alert and oriented,  speech normal, strength and sensation intact       Chemistry      Component Value Date/Time   NA 138 04/21/2015 1051   K 4.1 04/21/2015 1051   CL 104 04/21/2015 1051   CO2 25 04/21/2015 1051   BUN 21 04/21/2015 1051   CREATININE 0.69 04/21/2015 1051      Component Value Date/Time   CALCIUM 10.2 04/21/2015 1051   ALKPHOS 82 05/07/2014 1430   AST 17 05/07/2014 1430   ALT 22 05/07/2014 1430   BILITOT 0.3 05/07/2014 1430      Lab Results  Component Value Date   WBC 8.5 05/28/2014   HGB 14.0 05/28/2014   HCT 41.6 05/28/2014   MCV 90.2 05/28/2014   PLT 285 05/28/2014   Lab Results  Component Value Date   TSH 1.098 05/07/2014   Lab Results  Component Value Date   HGBA1C 5.9 01/01/2015   Assessment & Plan:     Alexis Cole is a 71 y.o. female here for   Pre-syncope Likely multifactorial Could be related to anxiety Sounds orthostatic and orthostatic vital signs in clinic show a lying blood pressure 110/80, pulse 93, sitting blood pressure 100/80, pulse 94, standing blood pressure 80/60, pulse 104 No cardiogenic symptoms and EKG shows incomplete right bundle branch block, normal sinus rhythm, nonischemic, no previous EKG to compare to Check TSH, CMP, CBC We will stop Celexa as  patient is convinced this is related to her syncope Hold HCTZ for orthostatic hypotension until follow-up Advised her to stay well-hydrated She has follow-up appointment in 2 weeks with me and we will follow-up with presyncope at that time  Hyperlipidemia Check direct LDL as patient is taking atorvastatin and last lipid panel in 2016  Health maintenance alteration Check hepatitis C screening Not a candidate for low-dose chest CT as patient has less than 35-pack-year history of smoking  ANXIETY DEPRESSION Denies SI or HI Recent increase in symptoms seem situational with unsafe living environment Continue BuSpar 10 mg twice daily Stop Celexa as this caused much GI upset Celexa could be  related to weight loss due to GI upset   [name], MD MPH PGY-3,  [facility] 04/12/2016  5:11 PM

## 2016-04-12 NOTE — Patient Instructions (Signed)
Nice to see you again today. We are getting some labs and someone will call you or send you a letter with the results when they're available. Your blood pressure dropped pretty significantly when he went from sitting to standing which could've contributed to your feeling dizzy at the salon the other day. We will hold her HCTZ until he follow-up in 2 weeks. Please keep the appointment that you are to have scheduled with me.

## 2016-04-12 NOTE — Assessment & Plan Note (Signed)
Check direct LDL as patient is taking atorvastatin and last lipid panel in 2016

## 2016-04-13 ENCOUNTER — Ambulatory Visit (HOSPITAL_COMMUNITY)
Admission: RE | Admit: 2016-04-13 | Discharge: 2016-04-13 | Disposition: A | Discharge status: Home / Self Care | Payer: Medicare HMO | Source: Ambulatory Visit | Attending: Family Medicine | Admitting: Family Medicine

## 2016-04-13 ENCOUNTER — Other Ambulatory Visit: Payer: Medicare HMO

## 2016-04-13 DIAGNOSIS — E785 Hyperlipidemia, unspecified: Secondary | ICD-10-CM

## 2016-04-13 DIAGNOSIS — Z789 Other specified health status: Secondary | ICD-10-CM | POA: Diagnosis not present

## 2016-04-13 DIAGNOSIS — R55 Syncope and collapse: Secondary | ICD-10-CM

## 2016-04-13 LAB — COMPLETE METABOLIC PANEL WITH GFR
ALBUMIN: 4.6 g/dL (ref 3.6–5.1)
ALT: 20 U/L (ref 6–29)
AST: 22 U/L (ref 10–35)
Alkaline Phosphatase: 84 U/L (ref 33–130)
BILIRUBIN TOTAL: 0.6 mg/dL (ref 0.2–1.2)
BUN: 21 mg/dL (ref 7–25)
CO2: 27 mmol/L (ref 20–31)
CREATININE: 0.99 mg/dL (ref 0.50–0.99)
Calcium: >15 mg/dL (ref 8.6–10.4)
Chloride: 93 mmol/L — ABNORMAL LOW (ref 98–110)
GFR, EST AFRICAN AMERICAN: 67 mL/min (ref >=60)
GFR, Est Non African American: 58 mL/min — ABNORMAL LOW (ref >=60)
GLUCOSE: 131 mg/dL — AB (ref 65–99)
Potassium: 3 mmol/L — ABNORMAL LOW (ref 3.5–5.3)
SODIUM: 134 mmol/L — AB (ref 135–146)
TOTAL PROTEIN: 7.1 g/dL (ref 6.1–8.1)

## 2016-04-13 LAB — CBC
HCT: 45.5 % — ABNORMAL HIGH (ref 35.0–45.0)
Hemoglobin: 16.2 g/dL — ABNORMAL HIGH (ref 11.7–15.5)
MCH: 33.7 pg — AB (ref 27.0–33.0)
MCHC: 35.6 g/dL (ref 32.0–36.0)
MCV: 94.6 fL (ref 80.0–100.0)
MPV: 10.7 fL (ref 7.5–12.5)
PLATELETS: 248 10*3/uL (ref 140–400)
RBC: 4.81 MIL/uL (ref 3.80–5.10)
RDW: 13.6 % (ref 11.0–15.0)
WBC: 13 10*3/uL — ABNORMAL HIGH (ref 3.8–10.8)

## 2016-04-13 LAB — TSH: TSH: 0.87 mIU/L

## 2016-04-13 LAB — LDL CHOLESTEROL, DIRECT: Direct LDL: 120 mg/dL (ref <130)

## 2016-04-14 ENCOUNTER — Other Ambulatory Visit: Payer: Self-pay | Admitting: Family Medicine

## 2016-04-14 ENCOUNTER — Telehealth: Payer: Self-pay | Admitting: Family Medicine

## 2016-04-14 LAB — HEPATITIS C ANTIBODY: HCV Ab: NEGATIVE

## 2016-04-14 NOTE — Progress Notes (Signed)
Elevated calcium on yesterday's labs. Dr Brita Romp is calling patient to come for repeat lab. Since it is Friday, We will go ahead and get some additional labs.

## 2016-04-14 NOTE — Telephone Encounter (Signed)
Able to speak with patient.  Relayed information.  Patient to make lab appt to have labs drawn to confirm hypercalcemia.  [name], MD, MPH PGY-3,  [facility] 04/14/2016 10:52 AM

## 2016-04-14 NOTE — Telephone Encounter (Signed)
Called patient to discuss lab results from recent visit.  No answer, left VM asking to call back to clinic.    Noted Calcium critically high with questionable specimen integrity.  Plan to recheck labs, including Ca, Vit D, PTH, ionized calcium.  Also with LDL not at goal, could consider increasing Atorvastatin dose at next visit.  Can page me if patient calls back before 11am (I'm post-call) or talk to Dr. Nori Riis in clinic who is aware of labs.  Alexis Crews, MD, MPH PGY-3,  [facility] 04/14/2016 9:09 AM

## 2016-04-17 ENCOUNTER — Other Ambulatory Visit: Payer: Medicare HMO

## 2016-04-18 ENCOUNTER — Telehealth: Payer: Self-pay | Admitting: *Deleted

## 2016-04-18 ENCOUNTER — Encounter: Payer: Self-pay | Admitting: Family Medicine

## 2016-04-18 LAB — VITAMIN D 25 HYDROXY (VIT D DEFICIENCY, FRACTURES): Vit D, 25-Hydroxy: 28 ng/mL — ABNORMAL LOW (ref 30–100)

## 2016-04-18 LAB — CALCIUM, IONIZED: Calcium, Ion: 7.5 mg/dL (ref 4.8–5.6)

## 2016-04-18 LAB — BASIC METABOLIC PANEL
BUN: 27 mg/dL — AB (ref 7–25)
CHLORIDE: 101 mmol/L (ref 98–110)
CO2: 28 mmol/L (ref 20–31)
Calcium: 12.9 mg/dL — ABNORMAL HIGH (ref 8.6–10.4)
Creat: 0.88 mg/dL (ref 0.50–0.99)
GLUCOSE: 117 mg/dL — AB (ref 65–99)
POTASSIUM: 3.2 mmol/L — AB (ref 3.5–5.3)
Sodium: 137 mmol/L (ref 135–146)

## 2016-04-18 LAB — PARATHYROID HORMONE, INTACT (NO CA): PTH: 133 pg/mL — ABNORMAL HIGH (ref 14–64)

## 2016-04-18 NOTE — Progress Notes (Signed)
Calcium and ionized calcium results seen. PTH is still outstanding. Will discuss with Dr Brita Romp (PCP) this afternoon and formulate further work up plan. See if we can get PTH reusult sooner. Dorcas Mcmurray

## 2016-04-18 NOTE — Telephone Encounter (Signed)
Received a call from [facility] this morning reporting a critical result on Alexis Cole. Her Calcium Ionized is at 7.5, critical high. Results reported to Dr Nori Riis at 10:05 am. Ronnald Nian, Kevin Fenton

## 2016-04-25 ENCOUNTER — Ambulatory Visit: Payer: Commercial Managed Care - HMO | Admitting: Family Medicine

## 2016-04-25 DIAGNOSIS — M1712 Unilateral primary osteoarthritis, left knee: Secondary | ICD-10-CM | POA: Diagnosis not present

## 2016-04-25 DIAGNOSIS — M47816 Spondylosis without myelopathy or radiculopathy, lumbar region: Secondary | ICD-10-CM | POA: Diagnosis not present

## 2016-04-25 DIAGNOSIS — M1711 Unilateral primary osteoarthritis, right knee: Secondary | ICD-10-CM | POA: Diagnosis not present

## 2016-04-26 ENCOUNTER — Other Ambulatory Visit: Payer: Self-pay | Admitting: Family Medicine

## 2016-04-26 DIAGNOSIS — E213 Hyperparathyroidism, unspecified: Secondary | ICD-10-CM

## 2016-04-26 NOTE — Progress Notes (Signed)
Subjective:   Alexis Cole is a 71 y.o. female with a history of HTN, recurrent sinusitis, osteoarthritis, anxiety, tobacco use, obesity here for follow-up of presyncope and hypercalcemia  Pre-syncope - seen 2/21 for presyncope - was found to have orthostatic hypotension - HCTZ was held - No further symptoms - Does report intermittent swelling in her feet after standing for long periods of time at work since stopping HCTZ  Hypercalcemia - Incidentally noted to have a calcium of greater than 15 on lab work for visit on 04/13/16 -Repeat calcium 12.9, ionized calcium 7.5 - PTH elevated at 133, vitamin D slightly low at 28 - kidney stones - never - osteoporosis, broken bones - no history - Has been treated recently for worsening anxiety, but has long-standing history of anxiety and home situation is stressful - tums - on med list, hasn't taken in >6 months - Continues to feel very tired and is working less hours  Review of Systems:  Per HPI.   Social History: Current smoker  Objective:  BP 121/80 (BP Location: Left Arm, Patient Position: Standing, Cuff Size: Normal)   Pulse (!) 103   Temp 98 F (36.7 C) (Oral)   Ht 5\' 6"  (1.676 m)   Wt 195 lb (88.5 kg)   SpO2 96%   BMI 31.47 kg/m   Gen:  71 y.o. female in NAD  HEENT: NCAT, MMM, EOMI, PERRL, anicteric sclerae Neck: Supple, no LAD, no thyromegaly CV: RRR, no MRG Resp: Non-labored, CTAB, no wheezes noted Abd: Soft, NTND, BS present, no guarding or organomegaly Ext: WWP, no edema MSK: No obvious deformities, gait intact Neuro: Alert and oriented, speech normal       Chemistry      Component Value Date/Time   NA 137 04/17/2016 1001   K 3.2 (L) 04/17/2016 1001   CL 101 04/17/2016 1001   CO2 28 04/17/2016 1001   BUN 27 (H) 04/17/2016 1001   CREATININE 0.88 04/17/2016 1001      Component Value Date/Time   CALCIUM 12.9 (H) 04/17/2016 1001   ALKPHOS 84 04/13/2016 1559   AST 22 04/13/2016 1559   ALT 20 04/13/2016  1559   BILITOT 0.6 04/13/2016 1559      Lab Results  Component Value Date   WBC 13.0 (H) 04/13/2016   HGB 16.2 (H) 04/13/2016   HCT 45.5 (H) 04/13/2016   MCV 94.6 04/13/2016   PLT 248 04/13/2016   Lab Results  Component Value Date   TSH 0.87 04/13/2016   Lab Results  Component Value Date   HGBA1C 5.9 01/01/2015   Assessment & Plan:     Alexis Cole is a 71 y.o. female here for   Pre-syncope Resolved Likely related to orthostatic hypotension Now improved after stopping HCTZ Continue to hold HCTZ Continue to work on staying hydrated  Hyperparathyroidism ([place]) Incidentally found to have hypercalcemia Labs consistent with primary hyperparathyroidism If this were hypercalcemia of malignancy, would not expect PTH to be elevated to twice the upper limit of normal DEXA scan to assess for osteoporosis as she is at increased risk for this 24 hour urinary calcium excretion (to assess risk of renal complications, The Fourth International Workshop on Asymptomatic Primary Hyperparathyroidism guidelines recommended surgical intervention as opposed to observation in asymptomatic patients who have a 24-hour urinary calcium excretion >400 mg/day) Referral to general surgery to discuss possible surgical removal of parathyroid glands for treatment   [name], MD MPH PGY-3,  [facility] 04/27/2016  12:06 PM

## 2016-04-27 ENCOUNTER — Ambulatory Visit (INDEPENDENT_AMBULATORY_CARE_PROVIDER_SITE_OTHER): Payer: Medicare HMO | Admitting: Family Medicine

## 2016-04-27 VITALS — BP 121/80 | HR 103 | Temp 98.0°F | Ht 66.0 in | Wt 195.0 lb

## 2016-04-27 DIAGNOSIS — E210 Primary hyperparathyroidism: Secondary | ICD-10-CM | POA: Insufficient documentation

## 2016-04-27 DIAGNOSIS — E213 Hyperparathyroidism, unspecified: Secondary | ICD-10-CM

## 2016-04-27 DIAGNOSIS — R55 Syncope and collapse: Secondary | ICD-10-CM

## 2016-04-27 MED ORDER — CYCLOBENZAPRINE HCL 10 MG PO TABS: 10.0000 mg | ORAL_TABLET | Freq: Three times a day (TID) | ORAL | 1 refills | Status: DC | PRN

## 2016-04-27 NOTE — Patient Instructions (Signed)
Nice to see you again today. Your blood pressure is well controlled with just lisinopril. Continue to hold HCTZ. You can try compression socks as needed for swelling when you're in your feet during the day.  For your high calcium, this is most likely related to hyperparathyroidism. We will get a bone density scan. We will get a urine test. I'm also putting in a referral to surgery so they can discuss treatment options with you.  Take care, Dr. Jacinto Reap

## 2016-04-27 NOTE — Assessment & Plan Note (Signed)
Resolved Likely related to orthostatic hypotension Now improved after stopping HCTZ Continue to hold HCTZ Continue to work on staying hydrated

## 2016-04-27 NOTE — Assessment & Plan Note (Signed)
Incidentally found to have hypercalcemia Labs consistent with primary hyperparathyroidism If this were hypercalcemia of malignancy, would not expect PTH to be elevated to twice the upper limit of normal DEXA scan to assess for osteoporosis as she is at increased risk for this 24 hour urinary calcium excretion (to assess risk of renal complications, The Fourth International Workshop on Asymptomatic Primary Hyperparathyroidism guidelines recommended surgical intervention as opposed to observation in asymptomatic patients who have a 24-hour urinary calcium excretion >400 mg/day) Referral to general surgery to discuss possible surgical removal of parathyroid glands for treatment

## 2016-04-28 ENCOUNTER — Other Ambulatory Visit: Payer: Medicare HMO

## 2016-05-02 DIAGNOSIS — E213 Hyperparathyroidism, unspecified: Secondary | ICD-10-CM | POA: Diagnosis not present

## 2016-05-03 ENCOUNTER — Encounter: Payer: Self-pay | Admitting: Family Medicine

## 2016-05-03 LAB — CALCIUM, URINE, 24 HOUR
CALCIUM 24HR UR: 133 mg/(24.h) (ref 35–250)
Calcium, Ur: 9 mg/dL

## 2016-05-15 ENCOUNTER — Other Ambulatory Visit: Payer: Self-pay | Admitting: *Deleted

## 2016-05-15 MED ORDER — ATORVASTATIN CALCIUM 40 MG PO TABS: 40.0000 mg | ORAL_TABLET | Freq: Every day | ORAL | 3 refills | Status: DC

## 2016-05-26 ENCOUNTER — Encounter: Payer: Self-pay | Admitting: Family Medicine

## 2016-05-26 ENCOUNTER — Ambulatory Visit (INDEPENDENT_AMBULATORY_CARE_PROVIDER_SITE_OTHER): Payer: Medicare HMO | Admitting: Family Medicine

## 2016-05-26 ENCOUNTER — Encounter: Payer: Self-pay | Admitting: Psychology

## 2016-05-26 VITALS — BP 110/86 | HR 111 | Temp 98.6°F | Ht 66.0 in | Wt 190.0 lb

## 2016-05-26 DIAGNOSIS — F341 Dysthymic disorder: Secondary | ICD-10-CM | POA: Diagnosis not present

## 2016-05-26 DIAGNOSIS — E876 Hypokalemia: Secondary | ICD-10-CM

## 2016-05-26 DIAGNOSIS — R42 Dizziness and giddiness: Secondary | ICD-10-CM | POA: Diagnosis not present

## 2016-05-26 MED ORDER — BUSPIRONE HCL 10 MG PO TABS: 20.0000 mg | ORAL_TABLET | Freq: Two times a day (BID) | ORAL | 3 refills | Status: DC
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
## 2016-05-26 NOTE — Patient Instructions (Signed)
I recommend that you follow up in 1 week with Dr B.  I have increased your Buspar to 2 tablets (or 18m) 2 times daily.  You met with one of our integrated care specialists today.  I encourage you to continue seeing him or one of his colleagues.  His services are available to you as a patient of this clinic.  You had labs done today.  I will forward these to Dr B. If anything is abnormal, I will call you.  Otherwise, expect a copy to be mailed to you.
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
## 2016-05-27 LAB — CMP14+EGFR
ALT: 51 IU/L — AB (ref 0–32)
AST: 98 IU/L — ABNORMAL HIGH (ref 0–40)
Albumin/Globulin Ratio: 1.6 (ref 1.2–2.2)
Albumin: 4.3 g/dL (ref 3.6–4.8)
Alkaline Phosphatase: 95 IU/L (ref 39–117)
BUN/Creatinine Ratio: 18 (ref 12–28)
BUN: 19 mg/dL (ref 8–27)
Bilirubin Total: 1 mg/dL (ref 0.0–1.2)
CALCIUM: 10.2 mg/dL (ref 8.7–10.3)
CO2: 21 mmol/L (ref 18–29)
Chloride: 97 mmol/L (ref 96–106)
Creatinine, Ser: 1.05 mg/dL — ABNORMAL HIGH (ref 0.57–1.00)
GFR calc non Af Amer: 54 mL/min/{1.73_m2} — ABNORMAL LOW (ref >59)
GFR, EST AFRICAN AMERICAN: 63 mL/min/{1.73_m2} (ref >59)
GLOBULIN, TOTAL: 2.7 g/dL (ref 1.5–4.5)
Glucose: 108 mg/dL — ABNORMAL HIGH (ref 65–99)
POTASSIUM: 3.6 mmol/L (ref 3.5–5.2)
SODIUM: 143 mmol/L (ref 134–144)
TOTAL PROTEIN: 7 g/dL (ref 6.0–8.5)

## 2016-05-27 LAB — MAGNESIUM: Magnesium: 2.2 mg/dL (ref 1.6–2.3)

## 2016-05-29 ENCOUNTER — Telehealth: Payer: Self-pay | Admitting: Family Medicine

## 2016-05-29 ENCOUNTER — Encounter: Payer: Self-pay | Admitting: Family Medicine

## 2016-05-29 NOTE — Telephone Encounter (Signed)
Attempted to call regarding recent CMP.  Calcium level WNL and Potassium level WNL.  However, bump in AST and ALT noted along with a small bump in Cr.  I recommend that this is followed up with PCP.  If she returns my call, please have her schedule an appointment with Dr Brita Romp.  I will also send a letter to her home with these results.    Alexis Cole M. Lajuana Ripple, DO PGY-3, [facility]

## 2016-05-30 NOTE — Telephone Encounter (Signed)
Pt would like Dr. B to call her today with results. Pt declined to make an appointment at this time, pt wants to wait and talk to Dr. B first. ep

## 2016-05-30 NOTE — Telephone Encounter (Signed)
Will forward to Dr Lajuana Ripple who was attempting to reach patient with results from her visit.  Alexis Crews, MD, MPH PGY-3,  [facility] 05/30/2016 12:58 PM

## 2016-05-30 NOTE — Telephone Encounter (Signed)
Pt has called about her results. She is sitting by the phone waiting for her dr to call

## 2016-05-30 NOTE — Telephone Encounter (Signed)
I was able to reach patient by phone.  We discussed her lab abnormalities.  I informed her that a letter with these results have also been sent to her home.  I scheduled her an appointment with Dr B on Monday to follow up on LFTs/ Anxiety/ Fatigue.

## 2016-06-05 ENCOUNTER — Ambulatory Visit: Payer: Medicare HMO | Admitting: Family Medicine

## 2016-06-05 NOTE — Progress Notes (Deleted)
   Subjective:   Alexis Cole is a 71 y.o. female with a history of *** here for follow-up of abnormal LFTs  ***  HTN: - Medications: *** - Compliance: *** - Checking BP at home: *** - Denies any SOB, CP, vision changes, LE edema, medication SEs, or symptoms of hypotension - Diet: *** - Exercise: ***  Abnormal LFTs - on recent visit, 4/***/18, AST ***, ALT ***  Anxiety - Recent visit 4/***/18, buspar increased to ***   Review of Systems:  Per HPI.   Social History: *** smoker  Objective:  There were no vitals taken for this visit.  Gen:  71 y.o. female in NAD *** HEENT: NCAT, MMM, EOMI, PERRL, anicteric sclerae CV: RRR, no MRG, no JVD Resp: Non-labored, CTAB, no wheezes noted Abd: Soft, NTND, BS present, no guarding or organomegaly Ext: WWP, no edema MSK: Full ROM, strength intact Neuro: Alert and oriented, speech normal       Chemistry      Component Value Date/Time   NA 143 05/26/2016 1512   K 3.6 05/26/2016 1512   CL 97 05/26/2016 1512   CO2 21 05/26/2016 1512   BUN 19 05/26/2016 1512   CREATININE 1.05 (H) 05/26/2016 1512   CREATININE 0.88 04/17/2016 1001      Component Value Date/Time   CALCIUM 10.2 05/26/2016 1512   ALKPHOS 95 05/26/2016 1512   AST 98 (H) 05/26/2016 1512   ALT 51 (H) 05/26/2016 1512   BILITOT 1.0 05/26/2016 1512      Lab Results  Component Value Date   WBC 13.0 (H) 04/13/2016   HGB 16.2 (H) 04/13/2016   HCT 45.5 (H) 04/13/2016   MCV 94.6 04/13/2016   PLT 248 04/13/2016   Lab Results  Component Value Date   TSH 0.87 04/13/2016   Lab Results  Component Value Date   HGBA1C 5.9 01/01/2015   Assessment & Plan:     ANALYSE ANGST is a 71 y.o. female here for ***  No problem-specific Assessment & Plan notes found for this encounter.     [name], MD MPH PGY-3,  [facility] 06/05/2016  10:23 AM

## 2016-06-08 ENCOUNTER — Encounter (HOSPITAL_COMMUNITY): Payer: Self-pay | Admitting: *Deleted

## 2016-06-08 ENCOUNTER — Inpatient Hospital Stay (HOSPITAL_COMMUNITY)
Admission: EM | Admit: 2016-06-08 | Discharge: 2016-06-11 | DRG: 640 | Disposition: A | Discharge status: Home / Self Care | Payer: Medicare HMO | Attending: Family Medicine | Admitting: Family Medicine

## 2016-06-08 ENCOUNTER — Emergency Department (HOSPITAL_COMMUNITY): Payer: Medicare HMO

## 2016-06-08 DIAGNOSIS — R627 Adult failure to thrive: Secondary | ICD-10-CM

## 2016-06-08 DIAGNOSIS — E860 Dehydration: Secondary | ICD-10-CM | POA: Diagnosis present

## 2016-06-08 DIAGNOSIS — R531 Weakness: Secondary | ICD-10-CM

## 2016-06-08 DIAGNOSIS — K5792 Diverticulitis of intestine, part unspecified, without perforation or abscess without bleeding: Secondary | ICD-10-CM

## 2016-06-08 DIAGNOSIS — E871 Hypo-osmolality and hyponatremia: Principal | ICD-10-CM

## 2016-06-08 DIAGNOSIS — F411 Generalized anxiety disorder: Secondary | ICD-10-CM | POA: Diagnosis present

## 2016-06-08 DIAGNOSIS — I700 Atherosclerosis of aorta: Secondary | ICD-10-CM | POA: Diagnosis present

## 2016-06-08 DIAGNOSIS — R55 Syncope and collapse: Secondary | ICD-10-CM | POA: Diagnosis not present

## 2016-06-08 DIAGNOSIS — R918 Other nonspecific abnormal finding of lung field: Secondary | ICD-10-CM | POA: Diagnosis not present

## 2016-06-08 DIAGNOSIS — E213 Hyperparathyroidism, unspecified: Secondary | ICD-10-CM | POA: Diagnosis present

## 2016-06-08 DIAGNOSIS — K5790 Diverticulosis of intestine, part unspecified, without perforation or abscess without bleeding: Secondary | ICD-10-CM

## 2016-06-08 DIAGNOSIS — M1990 Unspecified osteoarthritis, unspecified site: Secondary | ICD-10-CM | POA: Diagnosis present

## 2016-06-08 DIAGNOSIS — Z91048 Other nonmedicinal substance allergy status: Secondary | ICD-10-CM | POA: Diagnosis not present

## 2016-06-08 DIAGNOSIS — J189 Pneumonia, unspecified organism: Secondary | ICD-10-CM | POA: Diagnosis not present

## 2016-06-08 DIAGNOSIS — E872 Acidosis, unspecified: Secondary | ICD-10-CM

## 2016-06-08 DIAGNOSIS — K5730 Diverticulosis of large intestine without perforation or abscess without bleeding: Secondary | ICD-10-CM | POA: Diagnosis present

## 2016-06-08 DIAGNOSIS — E785 Hyperlipidemia, unspecified: Secondary | ICD-10-CM | POA: Diagnosis present

## 2016-06-08 DIAGNOSIS — R222 Localized swelling, mass and lump, trunk: Secondary | ICD-10-CM

## 2016-06-08 DIAGNOSIS — E861 Hypovolemia: Secondary | ICD-10-CM

## 2016-06-08 DIAGNOSIS — F17210 Nicotine dependence, cigarettes, uncomplicated: Secondary | ICD-10-CM | POA: Diagnosis present

## 2016-06-08 DIAGNOSIS — R197 Diarrhea, unspecified: Secondary | ICD-10-CM | POA: Diagnosis not present

## 2016-06-08 DIAGNOSIS — E876 Hypokalemia: Secondary | ICD-10-CM | POA: Diagnosis not present

## 2016-06-08 DIAGNOSIS — K760 Fatty (change of) liver, not elsewhere classified: Secondary | ICD-10-CM | POA: Diagnosis present

## 2016-06-08 DIAGNOSIS — R112 Nausea with vomiting, unspecified: Secondary | ICD-10-CM

## 2016-06-08 DIAGNOSIS — Z96641 Presence of right artificial hip joint: Secondary | ICD-10-CM | POA: Diagnosis present

## 2016-06-08 DIAGNOSIS — I10 Essential (primary) hypertension: Secondary | ICD-10-CM | POA: Diagnosis not present

## 2016-06-08 DIAGNOSIS — F064 Anxiety disorder due to known physiological condition: Secondary | ICD-10-CM | POA: Diagnosis present

## 2016-06-08 DIAGNOSIS — R000 Tachycardia, unspecified: Secondary | ICD-10-CM | POA: Diagnosis present

## 2016-06-08 DIAGNOSIS — Z79899 Other long term (current) drug therapy: Secondary | ICD-10-CM | POA: Diagnosis not present

## 2016-06-08 DIAGNOSIS — R42 Dizziness and giddiness: Secondary | ICD-10-CM | POA: Diagnosis not present

## 2016-06-08 DIAGNOSIS — R1110 Vomiting, unspecified: Secondary | ICD-10-CM | POA: Diagnosis not present

## 2016-06-08 DIAGNOSIS — G8929 Other chronic pain: Secondary | ICD-10-CM | POA: Diagnosis present

## 2016-06-08 DIAGNOSIS — F329 Major depressive disorder, single episode, unspecified: Secondary | ICD-10-CM | POA: Diagnosis present

## 2016-06-08 DIAGNOSIS — M545 Low back pain: Secondary | ICD-10-CM | POA: Diagnosis present

## 2016-06-08 HISTORY — DX: Weakness: R53.1

## 2016-06-08 HISTORY — DX: Fatty (change of) liver, not elsewhere classified: K76.0

## 2016-06-08 HISTORY — DX: Diverticulitis of intestine, part unspecified, without perforation or abscess without bleeding: K57.92

## 2016-06-08 HISTORY — DX: Atherosclerosis of aorta: I70.0

## 2016-06-08 HISTORY — DX: Diverticulosis of intestine, part unspecified, without perforation or abscess without bleeding: K57.90

## 2016-06-08 LAB — CBC
HCT: 38.7 % (ref 36.0–46.0)
Hemoglobin: 13.9 g/dL (ref 12.0–15.0)
MCH: 33.5 pg (ref 26.0–34.0)
MCHC: 35.9 g/dL (ref 30.0–36.0)
MCV: 93.3 fL (ref 78.0–100.0)
PLATELETS: 153 10*3/uL (ref 150–400)
RBC: 4.15 MIL/uL (ref 3.87–5.11)
RDW: 13.5 % (ref 11.5–15.5)
WBC: 7.5 10*3/uL (ref 4.0–10.5)

## 2016-06-08 LAB — I-STAT TROPONIN, ED: Troponin i, poc: 0.01 ng/mL (ref 0.00–0.08)

## 2016-06-08 LAB — URINALYSIS, ROUTINE W REFLEX MICROSCOPIC
GLUCOSE, UA: 50 mg/dL — AB
Ketones, ur: 20 mg/dL — AB
LEUKOCYTES UA: NEGATIVE
NITRITE: NEGATIVE
PH: 6 (ref 5.0–8.0)
PROTEIN: 100 mg/dL — AB
SPECIFIC GRAVITY, URINE: 1.023 (ref 1.005–1.030)

## 2016-06-08 LAB — BASIC METABOLIC PANEL
ANION GAP: 21 — AB (ref 5–15)
Anion gap: 15 (ref 5–15)
BUN: 16 mg/dL (ref 6–20)
BUN: 14 mg/dL (ref 6–20)
CALCIUM: 11.1 mg/dL — AB (ref 8.9–10.3)
CHLORIDE: 80 mmol/L — AB (ref 101–111)
CO2: 20 mmol/L — AB (ref 22–32)
CO2: 24 mmol/L (ref 22–32)
CREATININE: 0.89 mg/dL (ref 0.44–1.00)
Calcium: 9.9 mg/dL (ref 8.9–10.3)
Chloride: 87 mmol/L — ABNORMAL LOW (ref 101–111)
Creatinine, Ser: 1.01 mg/dL — ABNORMAL HIGH (ref 0.44–1.00)
GFR calc Af Amer: >60 mL/min (ref >60)
GFR, EST NON AFRICAN AMERICAN: 55 mL/min — AB (ref >60)
GLUCOSE: 114 mg/dL — AB (ref 65–99)
Glucose, Bld: 120 mg/dL — ABNORMAL HIGH (ref 65–99)
POTASSIUM: 3.1 mmol/L — AB (ref 3.5–5.1)
Potassium: 2.9 mmol/L — ABNORMAL LOW (ref 3.5–5.1)
SODIUM: 121 mmol/L — AB (ref 135–145)
Sodium: 126 mmol/L — ABNORMAL LOW (ref 135–145)

## 2016-06-08 LAB — LIPASE, BLOOD: Lipase: 87 U/L — ABNORMAL HIGH (ref 11–51)

## 2016-06-08 LAB — HEPATIC FUNCTION PANEL
ALT: 67 U/L — ABNORMAL HIGH (ref 14–54)
AST: 66 U/L — AB (ref 15–41)
Albumin: 3.7 g/dL (ref 3.5–5.0)
Alkaline Phosphatase: 84 U/L (ref 38–126)
BILIRUBIN DIRECT: 0.5 mg/dL (ref 0.1–0.5)
Indirect Bilirubin: 1 mg/dL — ABNORMAL HIGH (ref 0.3–0.9)
Total Bilirubin: 1.5 mg/dL — ABNORMAL HIGH (ref 0.3–1.2)
Total Protein: 6.3 g/dL — ABNORMAL LOW (ref 6.5–8.1)

## 2016-06-08 LAB — I-STAT CG4 LACTIC ACID, ED
LACTIC ACID, VENOUS: 5.21 mmol/L — AB (ref 0.5–1.9)
LACTIC ACID, VENOUS: 2.33 mmol/L — AB (ref 0.5–1.9)

## 2016-06-08 LAB — TROPONIN I: Troponin I: <0.03 ng/mL (ref <0.03)

## 2016-06-08 LAB — LACTIC ACID, PLASMA: LACTIC ACID, VENOUS: 1.6 mmol/L (ref 0.5–1.9)

## 2016-06-08 MED ORDER — SODIUM CHLORIDE 0.9 % IV SOLN
1500.0000 mg | Freq: Once | INTRAVENOUS | Status: AC
  Administered 2016-06-08: 1500 mg via INTRAVENOUS
  Filled 2016-06-08: qty 1500

## 2016-06-08 MED ORDER — SODIUM CHLORIDE 0.9 % IV SOLN
INTRAVENOUS | Status: DC
  Administered 2016-06-08 – 2016-06-09 (×2): via INTRAVENOUS

## 2016-06-08 MED ORDER — SODIUM CHLORIDE 0.9% FLUSH
3.0000 mL | Freq: Two times a day (BID) | INTRAVENOUS | Status: DC
  Administered 2016-06-10 – 2016-06-11 (×2): 3 mL via INTRAVENOUS

## 2016-06-08 MED ORDER — POLYETHYLENE GLYCOL 3350 17 G PO PACK: 17.0000 g | PACK | Freq: Every day | ORAL | Status: DC | PRN

## 2016-06-08 MED ORDER — ACETAMINOPHEN 650 MG RE SUPP: 650.0000 mg | Freq: Four times a day (QID) | RECTAL | Status: DC | PRN

## 2016-06-08 MED ORDER — SODIUM CHLORIDE 0.9 % IV BOLUS (SEPSIS)
1000.0000 mL | Freq: Once | INTRAVENOUS | Status: AC
  Administered 2016-06-08: 1000 mL via INTRAVENOUS

## 2016-06-08 MED ORDER — IOPAMIDOL (ISOVUE-300) INJECTION 61%
INTRAVENOUS | Status: AC
  Administered 2016-06-08: 100 mL via INTRAVENOUS
  Filled 2016-06-08: qty 100

## 2016-06-08 MED ORDER — MAGNESIUM OXIDE 400 (241.3 MG) MG PO TABS
800.0000 mg | ORAL_TABLET | Freq: Once | ORAL | Status: AC
  Administered 2016-06-08: 800 mg via ORAL
  Filled 2016-06-08: qty 2

## 2016-06-08 MED ORDER — MELATONIN 3 MG PO TABS
3.0000 mg | ORAL_TABLET | Freq: Every evening | ORAL | Status: DC | PRN
  Administered 2016-06-08: 3 mg via ORAL
  Filled 2016-06-08 (×2): qty 1

## 2016-06-08 MED ORDER — CEFTRIAXONE SODIUM 1 G IJ SOLR
1.0000 g | INTRAMUSCULAR | Status: DC
  Administered 2016-06-08 – 2016-06-10 (×3): 1 g via INTRAVENOUS
  Filled 2016-06-08 (×4): qty 10

## 2016-06-08 MED ORDER — BUSPIRONE HCL 5 MG PO TABS
20.0000 mg | ORAL_TABLET | Freq: Two times a day (BID) | ORAL | Status: DC
  Administered 2016-06-08 – 2016-06-11 (×6): 20 mg via ORAL
  Filled 2016-06-08 (×6): qty 1

## 2016-06-08 MED ORDER — ATORVASTATIN CALCIUM 40 MG PO TABS
40.0000 mg | ORAL_TABLET | Freq: Every day | ORAL | Status: DC
  Administered 2016-06-09 – 2016-06-11 (×3): 40 mg via ORAL
  Filled 2016-06-08 (×3): qty 1

## 2016-06-08 MED ORDER — ENSURE ENLIVE PO LIQD
237.0000 mL | Freq: Two times a day (BID) | ORAL | Status: DC
  Administered 2016-06-09: 237 mL via ORAL

## 2016-06-08 MED ORDER — ONDANSETRON HCL 4 MG/2ML IJ SOLN
4.0000 mg | Freq: Four times a day (QID) | INTRAMUSCULAR | Status: DC | PRN
Start: 2016-06-08 — End: 2016-06-11

## 2016-06-08 MED ORDER — ONDANSETRON HCL 4 MG PO TABS: 4.0000 mg | ORAL_TABLET | Freq: Four times a day (QID) | ORAL | Status: DC | PRN

## 2016-06-08 MED ORDER — PIPERACILLIN-TAZOBACTAM 3.375 G IVPB 30 MIN
3.3750 g | Freq: Once | INTRAVENOUS | Status: AC
  Administered 2016-06-08: 3.375 g via INTRAVENOUS
  Filled 2016-06-08: qty 50

## 2016-06-08 MED ORDER — LORAZEPAM 2 MG/ML IJ SOLN
0.5000 mg | Freq: Once | INTRAMUSCULAR | Status: AC
  Administered 2016-06-08: 0.5 mg via INTRAVENOUS
  Filled 2016-06-08: qty 1

## 2016-06-08 MED ORDER — ACETAMINOPHEN 325 MG PO TABS: 650.0000 mg | ORAL_TABLET | Freq: Four times a day (QID) | ORAL | Status: DC | PRN

## 2016-06-08 MED ORDER — SODIUM CHLORIDE 0.9 % IV SOLN
30.0000 meq | Freq: Once | INTRAVENOUS | Status: AC
  Administered 2016-06-08: 30 meq via INTRAVENOUS
  Filled 2016-06-08: qty 15

## 2016-06-08 MED ORDER — POTASSIUM CHLORIDE CRYS ER 20 MEQ PO TBCR
40.0000 meq | EXTENDED_RELEASE_TABLET | Freq: Once | ORAL | Status: AC
  Administered 2016-06-08: 40 meq via ORAL
  Filled 2016-06-08: qty 2

## 2016-06-08 MED ORDER — AZITHROMYCIN 500 MG PO TABS
500.0000 mg | ORAL_TABLET | ORAL | Status: DC
  Administered 2016-06-08 – 2016-06-10 (×3): 500 mg via ORAL
  Filled 2016-06-08 (×3): qty 1

## 2016-06-08 MED ORDER — ENOXAPARIN SODIUM 40 MG/0.4ML ~~LOC~~ SOLN
40.0000 mg | SUBCUTANEOUS | Status: DC
  Administered 2016-06-08 – 2016-06-10 (×2): 40 mg via SUBCUTANEOUS
  Filled 2016-06-08 (×3): qty 0.4

## 2016-06-08 MED ORDER — ALBUTEROL SULFATE (2.5 MG/3ML) 0.083% IN NEBU: 3.0000 mL | INHALATION_SOLUTION | Freq: Four times a day (QID) | RESPIRATORY_TRACT | Status: DC | PRN

## 2016-06-08 MED ORDER — SODIUM CHLORIDE 0.9 % IV BOLUS (SEPSIS)
1600.0000 mL | Freq: Once | INTRAVENOUS | Status: AC
  Administered 2016-06-08: 1600 mL via INTRAVENOUS

## 2016-06-08 NOTE — ED Triage Notes (Addendum)
Pt reports having dizziness, chest pressure and n/v x 2 weeks. Reports now having generalized fatigue/weakness. Reports having syncopal episodes while working over past two weeks.

## 2016-06-08 NOTE — ED Provider Notes (Signed)
[facility] DEPT Provider Note   CSN: [id number] Arrival date & time: 06/08/16  1126     History   Chief Complaint Chief Complaint  Patient presents with  . Dizziness  . Chest Pain    HPI Alexis Cole is a 71 y.o. female.  71 yo F with a chief complaint of dizziness. She describes this as a lightheadedness and feeling like she'll pass out. Worse upon standing or sitting. Started about a month ago. At that time it was worse with head movement. She denies continued dizziness with head movement. Has had some decrease of oral intake for the past 2 weeks because she hasn't felt like eating. Feels real nauseated. Denies vomiting. Denies weakness or numbness. Denies head injury. Did pass out twice this past week. Denies diarrhea or constipation.  She also has been describing severe anxiety. Feels like her prior panic attacks. Had one today where she felt she couldn't breathe so they called 911. The symptoms have improved.   The history is provided by the patient.  Dizziness  Quality:  Lightheadedness Severity:  Severe Onset quality:  Gradual Duration:  2 months Timing:  Constant Progression:  Worsening Chronicity:  New Context: standing up   Relieved by:  Lying down Worsened by:  Nothing Ineffective treatments:  None tried Associated symptoms: nausea and weakness   Associated symptoms: no chest pain, no headaches, no palpitations, no shortness of breath and no vomiting   Chest Pain   Associated symptoms include dizziness, nausea and weakness. Pertinent negatives include no fever, no headaches, no palpitations, no shortness of breath and no vomiting.    Past Medical History:  Diagnosis Date  . Acne    but not being treated for  . Anemia    history of--many yrs ago  . Anxiety    but doesn't take any meds for this  . Arthritis   . Chronic back pain   . Chronic back pain   . Depression   . Gastric ulcer    history of--many yrs ago  . Headache(784.0)   . History of  bronchitis    last time 67months  . Hx of migraines    last one a month ago  . Insomnia    Nyquil nightly  . Joint pain   . Joint swelling   . Sinusitis     Patient Active Problem List   Diagnosis Date Noted  . Hyperparathyroidism ([place]) 04/27/2016  . Pre-syncope 04/12/2016  . Fatigue 02/24/2016  . Back pain 11/17/2015  . Diarrhea 11/17/2015  . Insomnia 04/21/2015  . Hyperlipidemia 01/04/2015  . Breast mass in female 08/20/2014  . Estrogen deficiency 08/13/2014  . Acute recurrent sinusitis 08/13/2014  . Leg edema, left 07/13/2014  . Pap smear for cervical cancer screening 06/24/2014  . Obesity 05/07/2014  . Health maintenance alteration 05/07/2014  . Tobacco use disorder 04/24/2014  . Chronic meniscal tear of knee 04/17/2014  . Seasonal allergies 01/03/2013  . Osteoarthritis of right hip 09/01/2011  . Essential hypertension, benign 03/14/2011  . ANXIETY DEPRESSION 02/10/2009  . Arthropathy, lower leg 02/10/2009  . LOW BACK PAIN, CHRONIC 01/08/2008  . Arthritis 07/02/2007    Past Surgical History:  Procedure Laterality Date  . CERVICAL CONE BIOPSY    . DILATION AND CURETTAGE OF UTERUS  30+yrs ago  . MOUTH SURGERY    . NASAL SEPTUM SURGERY  40+yrs ago  . TOTAL HIP ARTHROPLASTY  09/01/2011   Procedure: TOTAL HIP ARTHROPLASTY;  Surgeon: [name], MD;  Location: [facility];  Service: Orthopedics;  Laterality: Right;  . TUBAL LIGATION  30+yrs ago    OB History    No data available       Home Medications    Prior to Admission medications   Medication Sig Start Date End Date Taking? Authorizing Provider  albuterol (PROVENTIL HFA;VENTOLIN HFA) 108 (90 Base) MCG/ACT inhaler Inhale 2 puffs into the lungs every 6 (six) hours as needed for wheezing or shortness of breath. 01/05/16  Yes [name], MD  atorvastatin (LIPITOR) 40 MG tablet Take 1 tablet (40 mg total) by mouth daily. 05/15/16  Yes [name], MD  busPIRone (BUSPAR) 10 MG tablet Take 2 tablets  (20 mg total) by mouth 2 (two) times daily. 05/26/16  Yes Ashly Windell Moulding, DO  diclofenac (VOLTAREN) 75 MG EC tablet TAKE 1 TABLET (75 MG TOTAL) BY MOUTH 2 (TWO) TIMES DAILY. 03/20/16  Yes [name], MD  gabapentin (NEURONTIN) 300 MG capsule Take 1 capsule (300 mg total) by mouth at bedtime. 02/24/16  Yes [name], MD  cyclobenzaprine (FLEXERIL) 10 MG tablet Take 1 tablet (10 mg total) by mouth 3 (three) times daily as needed for muscle spasms. Patient not taking: Reported on 06/08/2016 04/27/16   [name], MD  fluticasone [facility]) 50 MCG/ACT nasal spray Place 2 sprays into both nostrils daily. Patient not taking: Reported on 06/08/2016 01/01/15   [name], MD  hydrochlorothiazide (HYDRODIURIL) 25 MG tablet Take 1 tablet (25 mg total) by mouth daily. 03/29/16   [name], MD  lisinopril (PRINIVIL,ZESTRIL) 20 MG tablet Take 1 tablet (20 mg total) by mouth daily. 03/29/16   [name], MD  loratadine (CLARITIN) 10 MG tablet TAKE 1 TABLET (10 MG TOTAL) BY MOUTH DAILY. 04/28/15   [name], MD  Omega-3 Fatty Acids (FISH OIL PO) Take 1 capsule by mouth daily.    Historical Provider, MD  Vitamin D, Ergocalciferol, (DRISDOL) 50000 units CAPS capsule Take 1 capsule (50,000 Units total) by mouth every 7 (seven) days. 02/26/16   [name], MD    Family History Family History  Problem Relation Age of Onset  . Hypertension Mother   . Hyperlipidemia Neg Hx   . Heart attack Neg Hx   . Diabetes Neg Hx   . Sudden death Neg Hx     Social History Social History  Substance Use Topics  . Smoking status: Current Every Day Smoker    Packs/day: 0.50    Years: 15.00    Types: Cigarettes    Start date: 02/21/1968  . Smokeless tobacco: Never Used     Comment: quit in the past for 9 years.  Previous 1ppd  . Alcohol use 0.0 oz/week     Comment: wine with red meat;but stopped a month ago     Allergies   Tape   Review of Systems Review  of Systems  Constitutional: Negative for chills and fever.  HENT: Negative for congestion and rhinorrhea.   Eyes: Negative for redness and visual disturbance.  Respiratory: Negative for shortness of breath and wheezing.   Cardiovascular: Negative for chest pain and palpitations.  Gastrointestinal: Positive for nausea. Negative for vomiting.  Genitourinary: Negative for dysuria and urgency.  Musculoskeletal: Negative for arthralgias and myalgias.  Skin: Negative for pallor and wound.  Neurological: Positive for dizziness and weakness. Negative for headaches.     Physical Exam Updated Vital Signs BP 128/75   Pulse (!) 103  Temp 98.2 F (36.8 C) (Oral)   Resp 20   SpO2 95%   Physical Exam  Constitutional: She is oriented to person, place, and time. She appears well-developed and well-nourished. No distress.  HENT:  Head: Normocephalic and atraumatic.  Tachy mucous membranes  Eyes: EOM are normal. Pupils are equal, round, and reactive to light.  Neck: Normal range of motion. Neck supple.  Cardiovascular: Normal rate and regular rhythm.  Exam reveals no gallop and no friction rub.   No murmur heard. Pulmonary/Chest: Effort normal. She has no wheezes. She has no rales.  Abdominal: Soft. She exhibits no distension and no mass. There is no tenderness. There is no guarding.  Musculoskeletal: She exhibits no edema or tenderness.  Slight right-sided weakness compared to left worse to the upper extremity compared to the lower. Question right-sided facial droop  Neurological: She is alert and oriented to person, place, and time.  Skin: Skin is warm and dry. She is not diaphoretic.  Psychiatric: She has a normal mood and affect. Her behavior is normal.  Nursing note and vitals reviewed.    ED Treatments / Results  Labs (all labs ordered are listed, but only abnormal results are displayed) Labs Reviewed  BASIC METABOLIC PANEL - Abnormal; Notable for the following:       Result Value    Sodium 121 (*)    Potassium 2.9 (*)    Chloride 80 (*)    CO2 20 (*)    Glucose, Bld 120 (*)    Calcium 11.1 (*)    Anion gap 21 (*)    All other components within normal limits  URINALYSIS, ROUTINE W REFLEX MICROSCOPIC - Abnormal; Notable for the following:    Color, Urine AMBER (*)    APPearance CLOUDY (*)    Glucose, UA 50 (*)    Hgb urine dipstick MODERATE (*)    Bilirubin Urine SMALL (*)    Ketones, ur 20 (*)    Protein, ur 100 (*)    Bacteria, UA RARE (*)    Squamous Epithelial / LPF 0-5 (*)    All other components within normal limits  HEPATIC FUNCTION PANEL - Abnormal; Notable for the following:    Total Protein 6.3 (*)    AST 66 (*)    ALT 67 (*)    Total Bilirubin 1.5 (*)    Indirect Bilirubin 1.0 (*)    All other components within normal limits  LIPASE, BLOOD - Abnormal; Notable for the following:    Lipase 87 (*)    All other components within normal limits  I-STAT CG4 LACTIC ACID, ED - Abnormal; Notable for the following:    Lactic Acid, Venous 5.21 (*)    All other components within normal limits  CULTURE, BLOOD (ROUTINE X 2)  CULTURE, BLOOD (ROUTINE X 2)  CBC  I-STAT TROPOININ, ED    EKG  EKG Interpretation  Date/Time:  Thursday June 08 2016 11:38:13 EDT Ventricular Rate:  110 PR Interval:  124 QRS Duration: 86 QT Interval:  326 QTC Calculation: 441 R Axis:   -2 Text Interpretation:  Sinus tachycardia Left ventricular hypertrophy ST & T wave abnormality, consider lateral ischemia Abnormal ECG ? peaked t waves Confirmed by [facility] MD, DANIEL [phone]) on 06/08/2016 12:40:25 PM       Radiology Ct Head Wo Contrast  Result Date: 06/08/2016 CLINICAL DATA:  Dizziness.  Generalized body weakness and syncope. EXAM: CT HEAD WITHOUT CONTRAST TECHNIQUE: Contiguous axial images were obtained from the base of  the skull through the vertex without intravenous contrast. COMPARISON:  None. FINDINGS: Brain: Low attenuation throughout the subcortical and  periventricular white matter compatible with chronic microvascular disease. Prominence of the sulci and ventricles compatible with brain atrophy. There is no abnormal extra-axial fluid collection, intracranial hemorrhage or mass. No evidence for acute brain infarct. Vascular: No hyperdense vessel or unexpected calcification. Skull: Normal. Negative for fracture or focal lesion. Sinuses/Orbits: No acute finding. Other: None. IMPRESSION: 1. No acute intracranial abnormalities. 2. Chronic small vessel ischemic change and brain atrophy. Electronically Signed   By: Kerby Moors M.D.   On: 06/08/2016 14:24   Ct Abdomen Pelvis W Contrast  Result Date: 06/08/2016 CLINICAL DATA:  71 year old female with history of nausea and vomiting for the past 2 weeks. Pain when pushing on the belly. EXAM: CT ABDOMEN AND PELVIS WITH CONTRAST TECHNIQUE: Multidetector CT imaging of the abdomen and pelvis was performed using the standard protocol following bolus administration of intravenous contrast. CONTRAST:  1 ISOVUE-300 IOPAMIDOL (ISOVUE-300) INJECTION 61% COMPARISON:  No priors. FINDINGS: Lower chest: Asymmetric elevation of the left hemidiaphragm, with incompletely visualized lower left lung. Hepatobiliary: Diffuse low attenuation throughout the hepatic parenchyma, compatible with severe hepatic steatosis. No definite cystic or solid hepatic lesions are noted in the visualized portions of the liver (portions of segment 2 extend above the upper margin of the images into the lower left hemithorax). No intra or extrahepatic biliary ductal dilatation. Gallbladder is normal in appearance. Pancreas: No pancreatic mass. No pancreatic ductal dilatation. No pancreatic or peripancreatic fluid or inflammatory changes. Spleen: Visualized portions (superior aspect of the spleen is incompletely visualized) are unremarkable. Adrenals/Urinary Tract: Bilateral kidneys and bilateral adrenal glands are unremarkable in appearance. No  hydroureteronephrosis. Urinary bladder is partially obscured by beam hardening artifact from the patient's right hip arthroplasty, but the urinary bladder is essentially unremarkable in appearance. Stomach/Bowel: The appearance of the stomach is normal. No pathologic dilatation of small bowel or colon. Numerous colonic diverticulae are noted, without surrounding inflammatory changes to suggest an acute diverticulitis at this time. Normal appendix. Vascular/Lymphatic: Aortic atherosclerosis, without evidence of aneurysm or dissection in the abdominal or pelvic vasculature. No lymphadenopathy noted in the abdomen or pelvis. Reproductive: Endometrial thickness is estimated to be 1 cm. Ovaries are atrophic. Other: No significant volume of ascites.  No pneumoperitoneum. Musculoskeletal: There are no aggressive appearing lytic or blastic lesions noted in the visualized portions of the skeleton. Status post right hip arthroplasty. IMPRESSION: 1. No acute findings are noted in the abdomen or pelvis to account for the patient's symptoms. 2. Severe hepatic steatosis. 3. Colonic diverticulosis without evidence of acute diverticulitis at this time. 4. Aortic atherosclerosis. 5. **An incidental finding of potential clinical significance has been found. Low-attenuation endometrial thickening up to 10 mm, unusual in a postmenopausal female. Further evaluation with nonemergent pelvic ultrasound is recommended in the near future to better evaluate these findings, as endometrial hyperplasia or malignancy is not excluded.** Electronically Signed   By: Vinnie Langton M.D.   On: 06/08/2016 14:31    Procedures Procedures (including critical care time)  Medications Ordered in ED Medications  vancomycin (VANCOCIN) 1,500 mg in sodium chloride 0.9 % 500 mL IVPB (1,500 mg Intravenous New Bag/Given 06/08/16 1422)  potassium chloride 30 mEq in sodium chloride 0.9 % 265 mL (KCL MULTIRUN) IVPB (30 mEq Intravenous New Bag/Given 06/08/16  1422)  sodium chloride 0.9 % bolus 1,000 mL (0 mLs Intravenous Stopped 06/08/16 1507)  iopamidol (ISOVUE-300) 61 % injection (100 mLs Intravenous  Contrast Given 06/08/16 1355)  piperacillin-tazobactam (ZOSYN) IVPB 3.375 g (0 g Intravenous Stopped 06/08/16 1451)  sodium chloride 0.9 % bolus 1,600 mL (1,600 mLs Intravenous New Bag/Given 06/08/16 1457)  magnesium oxide (MAG-OX) tablet 800 mg (800 mg Oral Given 06/08/16 1417)  LORazepam (ATIVAN) injection 0.5 mg (0.5 mg Intravenous Given 06/08/16 1429)     Initial Impression / Assessment and Plan / ED Course  I have reviewed the triage vital signs and the nursing notes.  Pertinent labs & imaging results that were available during my care of the patient were reviewed by me and considered in my medical decision making (see chart for details).     71 yo F With a chief complaint of dizziness. Going on for about a month. Hasn't had anything to eat or drink in the past 2 weeks. Appears significantly dehydrated on my exam. Orthostatics just on sitting. The patient also has what appears to be new right-sided weakness on my exam. She does not subjectively feel any weakness. She has some epigastric pain on exam as well. I'm unsure of cause of this.Dehydration seems to play a significant factor. I will rehydrate down in the ED obtain a CT head and abdomen pelvis. At that point since she is unable to take care of herself at home she likely needs to be admitted for fluids.  Patient found to have a elevated lactate of 5.2. Code sepsis was initiated. Workup here with no specific finding of infection as of yet. Cover her broadly with a demise and Zosyn. Given 30 mL/kg of IV fluids. Discussed with family medicine likely stepdown admission.  CRITICAL CARE Performed by: Cecilio Asper   Total critical care time: 35 minutes  Critical care time was exclusive of separately billable procedures and treating other patients.  Critical care was necessary to treat or  prevent imminent or life-threatening deterioration.  Critical care was time spent personally by me on the following activities: development of treatment plan with patient and/or surrogate as well as nursing, discussions with consultants, evaluation of patient's response to treatment, examination of patient, obtaining history from patient or surrogate, ordering and performing treatments and interventions, ordering and review of laboratory studies, ordering and review of radiographic studies, pulse oximetry and re-evaluation of patient's condition.  The patients results and plan were reviewed and discussed.   Any x-rays performed were independently reviewed by myself.   Differential diagnosis were considered with the presenting HPI.  Medications  vancomycin (VANCOCIN) 1,500 mg in sodium chloride 0.9 % 500 mL IVPB (1,500 mg Intravenous New Bag/Given 06/08/16 1422)  potassium chloride 30 mEq in sodium chloride 0.9 % 265 mL (KCL MULTIRUN) IVPB (30 mEq Intravenous New Bag/Given 06/08/16 1422)  sodium chloride 0.9 % bolus 1,000 mL (0 mLs Intravenous Stopped 06/08/16 1507)  iopamidol (ISOVUE-300) 61 % injection (100 mLs Intravenous Contrast Given 06/08/16 1355)  piperacillin-tazobactam (ZOSYN) IVPB 3.375 g (0 g Intravenous Stopped 06/08/16 1451)  sodium chloride 0.9 % bolus 1,600 mL (1,600 mLs Intravenous New Bag/Given 06/08/16 1457)  magnesium oxide (MAG-OX) tablet 800 mg (800 mg Oral Given 06/08/16 1417)  LORazepam (ATIVAN) injection 0.5 mg (0.5 mg Intravenous Given 06/08/16 1429)    Vitals:   06/08/16 1300 06/08/16 1330 06/08/16 1430 06/08/16 1500  BP: 112/81  (!) 149/83 128/75  Pulse: 97 98 (!) 104 (!) 103  Resp: (!) 23 (!) 24 (!) 27 20  Temp:      TempSrc:      SpO2: 98% 98% 97% 95%  Final diagnoses:  Failure to thrive in adult  Lactic acidosis    Admission/ observation were discussed with the admitting physician, patient and/or family and they are comfortable with the plan.   Final  Clinical Impressions(s) / ED Diagnoses   Final diagnoses:  Failure to thrive in adult  Lactic acidosis    New Prescriptions New Prescriptions   No medications on file     Deno Etienne, DO 06/08/16 1523

## 2016-06-08 NOTE — ED Notes (Signed)
Pt wheeled to restroom by this nurse, pt became SOB on return to room, repositioned upright. O2 sat remained at 94-95% RA.

## 2016-06-08 NOTE — H&P (Signed)
[facility] Admission History and Physical Service Pager: [phone]  Patient name: Alexis Cole Medical record number: [id number] Date of birth: 1945/11/24 Age: 71 y.o. Gender: female  Primary Care Provider: Lavon Paganini, MD Consultants: None Code Status: Full  Chief Complaint: Lower extremity weakness, decreased PO intake, vomiting  Assessment and Plan: Alexis Cole is a 71 y.o. female presenting with dizziness, generalized weakness, decreased PO intake and intermittent NBNB emesis x 10 days. PMH is significant for anxiety, anemia, arthritis, chronic back pain, depression, migraines, hyperparathryoidism, syncope and sinusitis.  Generalized weakness: On admission, patient reported chronic, intermittent lower extremity weakness since August 2017. Today her weakness is severe in her bilateral lower extremities, however, it is also present throughout her entire body. Neuro exam non-focal and CT head negative for acute findings.  Of note, she has had nausea, decreased PO intake and multiple episodes of NBNB emesis and diarrhea over the last 10 days. CT abd unremarkable.  Denies any numbness or pain. Mostly likely 2/2 dehydration.  - Admit to FPTS, Dr. Andria Frames attending  - Admit to telemetry floor - Vital per floor protocol  - PM BMP, will determine IVF rate after this results  - IVF  - PT/OT - orthostatic vitals   Dyspnea I CAP : Dyspnea and a productive cough x 10 days. Denies fevers, chills, chest pain, or sore throat. On admission, she was tachycardic to 108. Her respirations were 16, with normal O2 on RA. She was afebrile and her blood pressure was stable. Marland Kitchen Her hemoglobin and WBC were 13.9 and 7.5, respectively. Her bicarb was 20, LA 5.21, AG 21, Cr 0.89. UA was negative for infection. Negative troponin x 1. EKG showed sinus tachycardia, left ventricular hypertrophy, ST & T wave abnormality, consider lateral ischemia, ? peaked t waves; EKG was a poor  study. CXR shows left lower lobe infiltrate consistent with pneumonia with a small left pleural effusion. Symptoms likely due to CAP although I think her anxiety is also contributing. ED initiated sepsis protocol, with tachycardia, tachypnea, and lactic acidosis.  - s/p Vancomycin and zosyn in the ED.  - s/p 2.6L NS (bolus) - IV ceftriaxone and PO azithromycin  - Blood cx pending - Urine s. pneumo and legionella  - troponin x1  - Repeat EKG    Hypertension: Normotensive in ED. Home antihypertensive medications were recently stopped by her PCP.  - Monitor   Hyponatremia with mild symptoms: On admission, Na was 121. She has had decreased PO intake and multiple episodes of NBNB emesis and diarrhea over the last 10 days. CT head negative for acute changes. Symptoms mostly likely 2/2 hypovolemic hyponatremia.  - PM BMP - will determine IVF after BMP results as we do not want to correct Na too quickly  Anion gap metabolic acidosis:  On admission, AG 21 (although Na significantly low), bicarb 20, LA 5.21. Most likely 2/2 infection. CXR as above. s/p 2.6L fluid in ED. - Trend LA   Hypokalemia: K 2.9 on admission - s/p IV K 54meq in ED - repeat BMP, will likely need further repletion  - Monitor BMP - check mag  Anxiety: She has a history of anxiety that was treated with Xanex in the past and Buspar currently. Patient often has panic attacks and recently has been experiencing increased stress and anxiety due to her neighbor.  - Restart her home Buspar  Possible syncopal episode: Two and a half weeks ago patient reports that she blacked out at work. No  bowel or bladder incontinence. Denies previous syncopal episodes.  - ECHO - telemetry   History of Smoking: She has a history of smoking an unknown amount per day for 7 years, quitting for 9 years and then smoking 1/2 ppd for 15 years. - Nicotine patch   Endometrial Thickening: noted to have 62mm stripe on CT abd.  - recommend outpatient  follow up  Hyperparathyroidism: was referred to surgery for possible parathyroidectomy but patient did not go to appointment. PTH in 03/2016 133. Ca today 11.1   FEN/GI: Heart Healthy Prophylaxis: Lovenox   Disposition: Pending resolution of weakness, hyponatremia and improvement of decreased PO intake and emesis   History of Present Illness:  Alexis Cole is a 71 y.o. female presenting with generalized weakness, decreased PO intake and intermittent NBNB emesis x 10 days. Patient isn't the best historian. She is accompanied by her brother who provided most of the history. Ms. Greb couldn't move her legs this morning and started to feel shaky. This has been a chronic issue since 09/2015. At this point her brother brought her to the hospital. She denies any pain or numbness. Onset of her symptoms started 10 days ago. She was diagnosed with PNA 2 months ago and reports that she received antibiotics. She reports that she finished her course of antibiotics. She has a history of anxiety and panic attacks. She reports that she had a syncopal episode 2.5 weeks ago, no head trauma, LOC, bowel or bladder incontinence. Additionally did not have chest pain, palpitations, or shortness of breath prior to the episode; she did not have any prodromal symptoms.  No prior falls in past. She reports weight loss stating that she is trying the [employer]. She endorses that she has had a decreased appetite and hasn't been able to keep any food down for the last 10 days. She reports of a productive cough; reports she does have a chronic cough " all her life" which has not changed recently. Reports of shortness of breath at rest for the past two week but does not have dyspnea on her. Reports of orthopnea but has been using 2 pillows for a while. No PND.    In the ED, patient was found to have tachycardia and tachypnea with normal oxygen saturations on RA. BP stable. No leukocytosis but had Na 416, K 2.9, metabolic acidosis  with LA 5.21. CT abdomen with no acute findings. CT head negative. CXR with LLL pneumonia. Sepsis protocol initiated and patient given IV Vanc and Zosyn.   Review Of Systems: Per HPI with the following additions:   ROS Otherwise the remainder of the systems were negative.  Review of Systems  Constitutional: Negative for chills and fever.  HENT: Negative for sinus pain and sore throat.   Eyes: Negative for blurred vision and double vision.  Respiratory: Positive for cough and sputum production. Negative for hemoptysis and shortness of breath.   Cardiovascular: Positive for palpitations and orthopnea. Negative for chest pain and leg swelling.  Gastrointestinal: Positive for nausea and vomiting. Negative for abdominal pain, blood in stool, diarrhea and melena.  Genitourinary: Negative for dysuria, frequency and urgency.  Musculoskeletal: Positive for back pain. Negative for falls.  Neurological: Positive for dizziness and weakness. Negative for tingling, tremors, speech change, focal weakness, loss of consciousness and headaches.  Psychiatric/Behavioral: Positive for depression and hallucinations. Negative for suicidal ideas. The patient is nervous/anxious.    Patient Active Problem List   Diagnosis Date Noted  . Hyperparathyroidism ([place]) 04/27/2016  .  Pre-syncope 04/12/2016  . Fatigue 02/24/2016  . Back pain 11/17/2015  . Diarrhea 11/17/2015  . Insomnia 04/21/2015  . Hyperlipidemia 01/04/2015  . Breast mass in female 08/20/2014  . Estrogen deficiency 08/13/2014  . Acute recurrent sinusitis 08/13/2014  . Leg edema, left 07/13/2014  . Pap smear for cervical cancer screening 06/24/2014  . Obesity 05/07/2014  . Health maintenance alteration 05/07/2014  . Tobacco use disorder 04/24/2014  . Chronic meniscal tear of knee 04/17/2014  . Seasonal allergies 01/03/2013  . Osteoarthritis of right hip 09/01/2011  . Essential hypertension, benign 03/14/2011  . ANXIETY DEPRESSION 02/10/2009   . Arthropathy, lower leg 02/10/2009  . LOW BACK PAIN, CHRONIC 01/08/2008  . Arthritis 07/02/2007   Past Medical History: Past Medical History:  Diagnosis Date  . Acne    but not being treated for  . Anemia    history of--many yrs ago  . Anxiety    but doesn't take any meds for this  . Arthritis   . Chronic back pain   . Chronic back pain   . Depression   . Gastric ulcer    history of--many yrs ago  . Headache(784.0)   . History of bronchitis    last time 23months  . Hx of migraines    last one a month ago  . Insomnia    Nyquil nightly  . Joint pain   . Joint swelling   . Sinusitis     Past Surgical History: Past Surgical History:  Procedure Laterality Date  . CERVICAL CONE BIOPSY    . DILATION AND CURETTAGE OF UTERUS  30+yrs ago  . MOUTH SURGERY    . NASAL SEPTUM SURGERY  40+yrs ago  . TOTAL HIP ARTHROPLASTY  09/01/2011   Procedure: TOTAL HIP ARTHROPLASTY;  Surgeon: [name], MD;  Location: [facility];  Service: Orthopedics;  Laterality: Right;  . TUBAL LIGATION  30+yrs ago    Social History: Social History  Substance Use Topics  . Smoking status: Current Every Day Smoker    Packs/day: 0.50    Years: 15.00    Types: Cigarettes    Start date: 02/21/1968  . Smokeless tobacco: Never Used     Comment: quit in the past for 9 years.  Previous 1ppd  . Alcohol use 0.0 oz/week     Comment: wine with red meat;but stopped a month ago   Additional social history: Works at [employer]. Current everyday smoker, 0.5 ppd, with 20 pack years. EtOH once per week (whiskey x2). No illicit drug use. Lives home alons, drives. Please also refer to relevant sections of EMR. Please also refer to relevant sections of EMR.  Family History: Family History  Problem Relation Age of Onset  . Hypertension Mother   . Hyperlipidemia Neg Hx   . Heart attack Neg Hx   . Diabetes Neg Hx   . Sudden death Neg Hx    Father: cancer (unknown source) Mother: Diverticulitis   Allergies and  Medications: Allergies  Allergen Reactions  . Tape Rash   No current facility-administered medications on file prior to encounter.    Current Outpatient Prescriptions on File Prior to Encounter  Medication Sig Dispense Refill  . albuterol (PROVENTIL HFA;VENTOLIN HFA) 108 (90 Base) MCG/ACT inhaler Inhale 2 puffs into the lungs every 6 (six) hours as needed for wheezing or shortness of breath. 1 Inhaler 0  . atorvastatin (LIPITOR) 40 MG tablet Take 1 tablet (40 mg total) by mouth daily. 90 tablet 3  . busPIRone (  BUSPAR) 10 MG tablet Take 2 tablets (20 mg total) by mouth 2 (two) times daily. 180 tablet 3  . diclofenac (VOLTAREN) 75 MG EC tablet TAKE 1 TABLET (75 MG TOTAL) BY MOUTH 2 (TWO) TIMES DAILY. 60 tablet 2  . gabapentin (NEURONTIN) 300 MG capsule Take 1 capsule (300 mg total) by mouth at bedtime. 30 capsule 0  . cyclobenzaprine (FLEXERIL) 10 MG tablet Take 1 tablet (10 mg total) by mouth 3 (three) times daily as needed for muscle spasms. (Patient not taking: Reported on 06/08/2016) 60 tablet 1  . fluticasone (FLONASE) 50 MCG/ACT nasal spray Place 2 sprays into both nostrils daily. (Patient not taking: Reported on 06/08/2016) 16 g 6  . hydrochlorothiazide (HYDRODIURIL) 25 MG tablet Take 1 tablet (25 mg total) by mouth daily. 90 tablet 3  . lisinopril (PRINIVIL,ZESTRIL) 20 MG tablet Take 1 tablet (20 mg total) by mouth daily. 90 tablet 3  . loratadine (CLARITIN) 10 MG tablet TAKE 1 TABLET (10 MG TOTAL) BY MOUTH DAILY. 30 tablet 10  . Omega-3 Fatty Acids (FISH OIL PO) Take 1 capsule by mouth daily.    . Vitamin D, Ergocalciferol, (DRISDOL) 50000 units CAPS capsule Take 1 capsule (50,000 Units total) by mouth every 7 (seven) days. 4 capsule 1    Objective: BP (!) 147/71   Pulse (!) 103   Temp 98.2 F (36.8 C) (Oral)   Resp (!) 25   SpO2 97%  Exam: General: well nourished, well developed, in no acute distress with non-toxic appearance Eyes: PERRLA, EOMI, no scleral icterus ENTM: moist  mucous membranes, non-erythematous oropharynx with no exudates Neck: Supple, non-tender without lymphadenopathy Cardiovascular: Tachycardic rate, regular rhythm. No murmurs, rubs or gallops appreciated.  Respiratory: Clear to auscultation bilaterally with normal work of breathing Gastrointestinal: Soft, non-tender, no masses or organomegaly palpable, normoactive bowel sounds MSK: Warm, dry and well perfused extremities. Normal tone and movement. 5/5 strength in BL UE and LE. Derm: Warm, dry skin. Healing scabs on right leg from dog scratch. cap refill < 2 seconds Neuro: Alert and oriented x 3, however cognition is slow. CN 2-12 intact.  Psych: Appropriate affect and mood.  Labs and Imaging: Results for orders placed or performed during the hospital encounter of 78/29/56  Basic metabolic panel  Result Value Ref Range   Sodium 121 (L) 135 - 145 mmol/L   Potassium 2.9 (L) 3.5 - 5.1 mmol/L   Chloride 80 (L) 101 - 111 mmol/L   CO2 20 (L) 22 - 32 mmol/L   Glucose, Bld 120 (H) 65 - 99 mg/dL   BUN 16 6 - 20 mg/dL   Creatinine, Ser 0.89 0.44 - 1.00 mg/dL   Calcium 11.1 (H) 8.9 - 10.3 mg/dL   GFR calc non Af Amer >60 >60 mL/min   GFR calc Af Amer >60 >60 mL/min   Anion gap 21 (H) 5 - 15  CBC  Result Value Ref Range   WBC 7.5 4.0 - 10.5 K/uL   RBC 4.15 3.87 - 5.11 MIL/uL   Hemoglobin 13.9 12.0 - 15.0 g/dL   HCT 38.7 36.0 - 46.0 %   MCV 93.3 78.0 - 100.0 fL   MCH 33.5 26.0 - 34.0 pg   MCHC 35.9 30.0 - 36.0 g/dL   RDW 13.5 11.5 - 15.5 %   Platelets 153 150 - 400 K/uL  Urinalysis, Routine w reflex microscopic  Result Value Ref Range   Color, Urine AMBER (A) YELLOW   APPearance CLOUDY (A) CLEAR   Specific Gravity,  Urine 1.023 1.005 - 1.030   pH 6.0 5.0 - 8.0   Glucose, UA 50 (A) NEGATIVE mg/dL   Hgb urine dipstick MODERATE (A) NEGATIVE   Bilirubin Urine SMALL (A) NEGATIVE   Ketones, ur 20 (A) NEGATIVE mg/dL   Protein, ur 100 (A) NEGATIVE mg/dL   Nitrite NEGATIVE NEGATIVE    Leukocytes, UA NEGATIVE NEGATIVE   RBC / HPF 0-5 0 - 5 RBC/hpf   WBC, UA 6-30 0 - 5 WBC/hpf   Bacteria, UA RARE (A) NONE SEEN   Squamous Epithelial / LPF 0-5 (A) NONE SEEN   Mucous PRESENT    Hyaline Casts, UA PRESENT    Granular Casts, UA PRESENT   Hepatic function panel  Result Value Ref Range   Total Protein 6.3 (L) 6.5 - 8.1 g/dL   Albumin 3.7 3.5 - 5.0 g/dL   AST 66 (H) 15 - 41 U/L   ALT 67 (H) 14 - 54 U/L   Alkaline Phosphatase 84 38 - 126 U/L   Total Bilirubin 1.5 (H) 0.3 - 1.2 mg/dL   Bilirubin, Direct 0.5 0.1 - 0.5 mg/dL   Indirect Bilirubin 1.0 (H) 0.3 - 0.9 mg/dL  Lipase, blood  Result Value Ref Range   Lipase 87 (H) 11 - 51 U/L  I-Stat Troponin, ED (not at [facility])  Result Value Ref Range   Troponin i, poc 0.01 0.00 - 0.08 ng/mL   Comment 3          I-Stat CG4 Lactic Acid, ED  Result Value Ref Range   Lactic Acid, Venous 5.21 (HH) 0.5 - 1.9 mmol/L   Comment NOTIFIED PHYSICIAN    Imaging Ct Head Wo Contrast  Result Date: 06/08/2016 CLINICAL DATA:  Dizziness.  Generalized body weakness and syncope. EXAM: CT HEAD WITHOUT CONTRAST TECHNIQUE: Contiguous axial images were obtained from the base of the skull through the vertex without intravenous contrast. COMPARISON:  None. FINDINGS: Brain: Low attenuation throughout the subcortical and periventricular white matter compatible with chronic microvascular disease. Prominence of the sulci and ventricles compatible with brain atrophy. There is no abnormal extra-axial fluid collection, intracranial hemorrhage or mass. No evidence for acute brain infarct. Vascular: No hyperdense vessel or unexpected calcification. Skull: Normal. Negative for fracture or focal lesion. Sinuses/Orbits: No acute finding. Other: None. IMPRESSION: 1. No acute intracranial abnormalities. 2. Chronic small vessel ischemic change and brain atrophy. Electronically Signed   By: Kerby Moors M.D.   On: 06/08/2016 14:24   Ct Abdomen Pelvis W Contrast  Result  Date: 06/08/2016 CLINICAL DATA:  71 year old female with history of nausea and vomiting for the past 2 weeks. Pain when pushing on the belly. EXAM: CT ABDOMEN AND PELVIS WITH CONTRAST TECHNIQUE: Multidetector CT imaging of the abdomen and pelvis was performed using the standard protocol following bolus administration of intravenous contrast. CONTRAST:  1 ISOVUE-300 IOPAMIDOL (ISOVUE-300) INJECTION 61% COMPARISON:  No priors. FINDINGS: Lower chest: Asymmetric elevation of the left hemidiaphragm, with incompletely visualized lower left lung. Hepatobiliary: Diffuse low attenuation throughout the hepatic parenchyma, compatible with severe hepatic steatosis. No definite cystic or solid hepatic lesions are noted in the visualized portions of the liver (portions of segment 2 extend above the upper margin of the images into the lower left hemithorax). No intra or extrahepatic biliary ductal dilatation. Gallbladder is normal in appearance. Pancreas: No pancreatic mass. No pancreatic ductal dilatation. No pancreatic or peripancreatic fluid or inflammatory changes. Spleen: Visualized portions (superior aspect of the spleen is incompletely visualized) are unremarkable. Adrenals/Urinary Tract: Bilateral  kidneys and bilateral adrenal glands are unremarkable in appearance. No hydroureteronephrosis. Urinary bladder is partially obscured by beam hardening artifact from the patient's right hip arthroplasty, but the urinary bladder is essentially unremarkable in appearance. Stomach/Bowel: The appearance of the stomach is normal. No pathologic dilatation of small bowel or colon. Numerous colonic diverticulae are noted, without surrounding inflammatory changes to suggest an acute diverticulitis at this time. Normal appendix. Vascular/Lymphatic: Aortic atherosclerosis, without evidence of aneurysm or dissection in the abdominal or pelvic vasculature. No lymphadenopathy noted in the abdomen or pelvis. Reproductive: Endometrial thickness  is estimated to be 1 cm. Ovaries are atrophic. Other: No significant volume of ascites.  No pneumoperitoneum. Musculoskeletal: There are no aggressive appearing lytic or blastic lesions noted in the visualized portions of the skeleton. Status post right hip arthroplasty. IMPRESSION: 1. No acute findings are noted in the abdomen or pelvis to account for the patient's symptoms. 2. Severe hepatic steatosis. 3. Colonic diverticulosis without evidence of acute diverticulitis at this time. 4. Aortic atherosclerosis. 5. **An incidental finding of potential clinical significance has been found. Low-attenuation endometrial thickening up to 10 mm, unusual in a postmenopausal female. Further evaluation with nonemergent pelvic ultrasound is recommended in the near future to better evaluate these findings, as endometrial hyperplasia or malignancy is not excluded.** Electronically Signed   By: Vinnie Langton M.D.   On: 06/08/2016 14:31   Smiley Houseman, MD PGY 2 Family Medicine

## 2016-06-08 NOTE — ED Notes (Signed)
Pt wheeled to restroom with this RN. Pt able to stand and use restroom independently. Unable to produce urine sample. Pt assisted back to bed.

## 2016-06-08 NOTE — ED Notes (Signed)
Attempted report x1. 

## 2016-06-08 NOTE — ED Notes (Signed)
Pt transported to XR.  

## 2016-06-08 NOTE — ED Notes (Signed)
Pt assisted with bed pan

## 2016-06-08 NOTE — ED Notes (Signed)
Pt still in XR

## 2016-06-08 NOTE — ED Notes (Signed)
Admitting at bedside 

## 2016-06-08 NOTE — ED Notes (Signed)
Pt O2 decreased to 90% on RA while pt sleeping, pt repositioned and 2L [place] placed on pt. Pt O2 increased to 97%.

## 2016-06-09 ENCOUNTER — Observation Stay (HOSPITAL_BASED_OUTPATIENT_CLINIC_OR_DEPARTMENT_OTHER): Payer: Medicare HMO

## 2016-06-09 DIAGNOSIS — E872 Acidosis: Secondary | ICD-10-CM | POA: Diagnosis not present

## 2016-06-09 DIAGNOSIS — R112 Nausea with vomiting, unspecified: Secondary | ICD-10-CM

## 2016-06-09 DIAGNOSIS — E213 Hyperparathyroidism, unspecified: Secondary | ICD-10-CM

## 2016-06-09 DIAGNOSIS — R55 Syncope and collapse: Secondary | ICD-10-CM

## 2016-06-09 DIAGNOSIS — R531 Weakness: Secondary | ICD-10-CM | POA: Diagnosis not present

## 2016-06-09 DIAGNOSIS — J189 Pneumonia, unspecified organism: Secondary | ICD-10-CM | POA: Diagnosis not present

## 2016-06-09 LAB — BASIC METABOLIC PANEL
ANION GAP: 11 (ref 5–15)
ANION GAP: 9 (ref 5–15)
ANION GAP: 11 (ref 5–15)
BUN: 14 mg/dL (ref 6–20)
BUN: 12 mg/dL (ref 6–20)
BUN: 10 mg/dL (ref 6–20)
CALCIUM: 10.1 mg/dL (ref 8.9–10.3)
CO2: 27 mmol/L (ref 22–32)
CO2: 23 mmol/L (ref 22–32)
CO2: 25 mmol/L (ref 22–32)
Calcium: 10.5 mg/dL — ABNORMAL HIGH (ref 8.9–10.3)
Calcium: 10.5 mg/dL — ABNORMAL HIGH (ref 8.9–10.3)
Chloride: 90 mmol/L — ABNORMAL LOW (ref 101–111)
Chloride: 96 mmol/L — ABNORMAL LOW (ref 101–111)
Chloride: 96 mmol/L — ABNORMAL LOW (ref 101–111)
Creatinine, Ser: 0.93 mg/dL (ref 0.44–1.00)
Creatinine, Ser: 0.88 mg/dL (ref 0.44–1.00)
Creatinine, Ser: 0.85 mg/dL (ref 0.44–1.00)
GFR calc Af Amer: >60 mL/min (ref >60)
GFR calc Af Amer: >60 mL/min (ref >60)
GFR calc non Af Amer: >60 mL/min (ref >60)
Glucose, Bld: 115 mg/dL — ABNORMAL HIGH (ref 65–99)
Glucose, Bld: 120 mg/dL — ABNORMAL HIGH (ref 65–99)
Glucose, Bld: 168 mg/dL — ABNORMAL HIGH (ref 65–99)
POTASSIUM: 3.1 mmol/L — AB (ref 3.5–5.1)
POTASSIUM: 3.1 mmol/L — AB (ref 3.5–5.1)
Potassium: 3.4 mmol/L — ABNORMAL LOW (ref 3.5–5.1)
SODIUM: 130 mmol/L — AB (ref 135–145)
SODIUM: 130 mmol/L — AB (ref 135–145)
Sodium: 128 mmol/L — ABNORMAL LOW (ref 135–145)

## 2016-06-09 LAB — HIV ANTIBODY (ROUTINE TESTING W REFLEX): HIV SCREEN 4TH GENERATION: NONREACTIVE

## 2016-06-09 LAB — LACTIC ACID, PLASMA: Lactic Acid, Venous: 1.4 mmol/L (ref 0.5–1.9)

## 2016-06-09 LAB — CBC
HEMATOCRIT: 34 % — AB (ref 36.0–46.0)
HEMOGLOBIN: 11.6 g/dL — AB (ref 12.0–15.0)
MCH: 32.4 pg (ref 26.0–34.0)
MCHC: 34.1 g/dL (ref 30.0–36.0)
MCV: 95 fL (ref 78.0–100.0)
Platelets: 130 10*3/uL — ABNORMAL LOW (ref 150–400)
RBC: 3.58 MIL/uL — ABNORMAL LOW (ref 3.87–5.11)
RDW: 13.5 % (ref 11.5–15.5)
WBC: 7.4 10*3/uL (ref 4.0–10.5)

## 2016-06-09 LAB — ECHOCARDIOGRAM COMPLETE: Height: 66 in

## 2016-06-09 LAB — STREP PNEUMONIAE URINARY ANTIGEN: Strep Pneumo Urinary Antigen: NEGATIVE

## 2016-06-09 LAB — MAGNESIUM: MAGNESIUM: 1.8 mg/dL (ref 1.7–2.4)

## 2016-06-09 MED ORDER — POTASSIUM CHLORIDE CRYS ER 20 MEQ PO TBCR: 40.0000 meq | EXTENDED_RELEASE_TABLET | Freq: Two times a day (BID) | ORAL | Status: DC

## 2016-06-09 MED ORDER — POTASSIUM CHLORIDE CRYS ER 20 MEQ PO TBCR
40.0000 meq | EXTENDED_RELEASE_TABLET | Freq: Once | ORAL | Status: AC
  Administered 2016-06-09: 40 meq via ORAL
  Filled 2016-06-09: qty 2

## 2016-06-09 MED ORDER — NICOTINE 7 MG/24HR TD PT24
7.0000 mg | MEDICATED_PATCH | Freq: Every day | TRANSDERMAL | Status: DC
  Administered 2016-06-09 – 2016-06-11 (×3): 7 mg via TRANSDERMAL
  Filled 2016-06-09 (×3): qty 1

## 2016-06-09 MED ORDER — POTASSIUM CHLORIDE CRYS ER 20 MEQ PO TBCR
40.0000 meq | EXTENDED_RELEASE_TABLET | Freq: Once | ORAL | Status: AC
  Administered 2016-06-10: 40 meq via ORAL
  Filled 2016-06-09: qty 2

## 2016-06-09 NOTE — Care Management Note (Addendum)
Case Management Note  Patient Details  Name: Alexis Cole MRN: [id number] Date of Birth: Mar 16, 1945  Subjective/Objective:                 Patient in obs from home alone. In obs for dehydration, N/V, hyponatremia Na 121. Patient states she has a RW at home   Action/Plan:  CM will continue to follow. Patient declines [place] at this time  Expected Discharge Date:                  Expected Discharge Plan:  [place]  In-House Referral:     Discharge planning Services  CM Consult  Post Acute Care Choice:    Choice offered to:     DME Arranged:    DME Agency:     HH Arranged:    [place] Agency:     Status of Service:  In process, will continue to follow  If discussed at Long Length of Stay Meetings, dates discussed:    Additional Comments:  Carles Collet, RN 06/09/2016, 12:58 PM

## 2016-06-09 NOTE — Discharge Summary (Signed)
[facility] Discharge Summary  Patient name: Alexis Cole Medical record number: [id number] Date of birth: 18-Jun-1945 Age: 71 y.o. Gender: female Date of Admission: 06/08/2016  Date of Discharge: 06/11/2016 Admitting Physician: Zenia Resides, MD  Primary Care Provider: Lavon Paganini, MD Consultants: None  Indication for Hospitalization: AMS and hyponatremia  Discharge Diagnoses/Problem List:  Chronic moderate hyponatremia with mild symptoms Generalized weakness Community acquired pneumonia meeting sepsis with lactic acidosis Hypertension Hypokalemia Hypomagnesemia Generalized anxiety disorder Incidental finding for endometrial thickening Hyperparathyroidism  Disposition: Home  Discharge Condition: Stable, improved  Discharge Exam:  General: well nourished, well developed, in no acute distress with non-toxic appearance HEENT: normocephalic, atraumatic, moist mucous membranes CV: regular rate and rhythm without murmurs, rubs, or gallops Lungs: clear to auscultation bilaterally with normal work of breathing Abdomen: soft, non-tender, normoactive bowel sounds Skin: warm, dry, no rashes or lesions, cap refill < 2 seconds Extremities: warm and well perfused, normal tone Neuro: alert and oriented 3 Psych: appropriate mood congruent affect  Brief Hospital Course:  Alexis Cole a 71 y.o.femalepresenting with dizziness, generalized weakness,decreased PO intake and intermittent NBNB emesis x 10 days. PMH is significant for anxiety, anemia, arthritis, chronic back pain, depression, migraines, hyperparathryoidism, syncopeand sinusitis.  Pt experienced 2 weeks of decreased PO intake, generalized weakness, and intermittent NBNB emesis x 10 days. Pt brought by brother to [facility] ED.  Upon arrival, VS tachycardic low 100s, tachypneic 20s, sating well on RA. There was slowed cognition but pt remained A&O through entirety of hospitalization.  Dehydration was appreciated and treated with sepsis fluid bolus given criteria was met. LLL CAP PNA was discovered and tx. Pt overall status significantly improved over course of 24 hours. Diet was advanced and electrolyte disturbances resolved. Pt was noticeably anxious during admission and continued home buspar.  Issues for Follow Up:  1. Check electrolytes at f/u given multiple abnormalities including Na, K, Mag. Check mag lvl. These were thought to be 2/2 to 2 weeks of poor PO intake and GI losses. 2. Pt has history of hyperparathyroidism. PTH level significantly elevated at 133 prior to admission. Pt failed to follow-up with surgery her prior hospitalization. Please make sure patient receives attention to this issue. Likely needs surgery. 3. CT abd/pelvis w/ incidental finding for low-attenuation endometrial thickening up to 10 mm, unusual in a postmenopausal female. Further evaluation with nonemergent pelvic ultrasound is recommended in the near future to better evaluate these findings, as endometrial hyperplasia or malignancy is not excluded. Please ensure this is followed up. 4. Pt instructed to hold Lisinopril on d/c due to normal BP and recovering volume depletion. Check Cr at f/u. 5. Pt d/c with 1 day supply of azithro for recovering CAP PNA. 6. Pt d/c with Imodium for acute diarrhea. Please follow up. 7. PT rec HHPT with rolling walker provided on d/c. PLease asses generalized weakness. 8. Anxiety was an obvious component to situation. Pt continued buspar during admission. Consider re-evaluating GAD-7 and alternative therapies if not well controlled.  Significant Procedures: None  Significant Labs and Imaging:   Recent Labs Lab 06/09/16 0032 06/10/16 0640 06/11/16 0746  WBC 7.4 5.9 5.9  HGB 11.6* 11.0* 10.2*  HCT 34.0* 32.4* 30.4*  PLT 130* 136* 145*    Recent Labs Lab 06/08/16 1311  06/09/16 1014 06/09/16 1232 06/09/16 2117 06/10/16 0640 06/11/16 0746 06/11/16 1633   NA  --   < > 130*  --  130* 132* 132* 135  K  --   < >  3.1*  --  3.1* 3.3* 3.4* 4.0  CL  --   < > 96*  --  96* 97* 101 108  CO2  --   < > 23  --  25 26 20* 22  GLUCOSE  --   < > 120*  --  168* 131* 107* 136*  BUN  --   < > 12  --  10 6 <5* <5*  CREATININE  --   < > 0.88  --  0.85 0.76 0.69 0.78  CALCIUM  --   < > 10.5*  --  10.5* 10.9* 10.5* 10.4*  MG  --   --   --  1.8  --   --  1.4*  --   ALKPHOS 84  --   --   --   --   --   --   --   AST 66*  --   --   --   --   --   --   --   ALT 67*  --   --   --   --   --   --   --   ALBUMIN 3.7  --   --   --   --   --   --   --   < > = values in this interval not displayed. Troponin: Negative Hepatic function panel: Total protein 6.3 (L), AST 66 (H), ALT 67 (H), total bili 1.5 (H), indirect bili 1.0 (H) Lipase: 87 (H) Lactic acid: 5.21 > 2.33 > 1.60 > 1.40 UA: Amber, cloudy, 50 glucose, mod hgb, sm bili, 20 ketones, 100 protein, rare bacteria, 0-5 sqams, mucus present, hyaline casts present, granular casts present Blood culture: NGTD 2 Strep pneumo urine: negative Legionella pneumo urine: negative HIV: nonreactive  Imaging/Diagnostic Tests: DG Chest 2 View (06/08/2016) FINDINGS: Mediastinum and hilar structures are normal. Left lower lobe infiltrate consistent with pneumonia. Associated left lower lobe atelectasis. No pleural effusion or pneumothorax. Elevation left hemidiaphragm again noted. Heart size normal. No acute bony abnormality .  IMPRESSION: 1. Left lower lobe infiltrate consistent pneumonia . Left lower lobe atelectatic changes. Small left pleural effusion. 2. Elevation left hemidiaphragm again noted.  CT Abdomen Pelvis W Contrast (06/08/2016) FINDINGS: Lower chest: Asymmetric elevation of the left hemidiaphragm, with incompletely visualized lower left lung. Hepatobiliary: Diffuse low attenuation throughout the hepatic parenchyma, compatible with severe hepatic steatosis. No definite cystic or solid hepatic lesions are  noted in the visualized portions of the liver (portions of segment 2 extend above the upper margin of the images into the lower left hemithorax). No intra or extrahepatic biliary ductal dilatation. Gallbladder is normal in appearance. Pancreas: No pancreatic mass. No pancreatic ductal dilatation. No pancreatic or peripancreatic fluid or inflammatory changes. Spleen: Visualized portions (superior aspect of the spleen is incompletely visualized) are unremarkable. Adrenals/Urinary Tract: Bilateral kidneys and bilateral adrenal glands are unremarkable in appearance. No hydroureteronephrosis. Urinary bladder is partially obscured by beam hardening artifact from the patient's right hip arthroplasty, but the urinary bladder is essentially unremarkable in appearance. Stomach/Bowel: The appearance of the stomach is normal. No pathologic dilatation of small bowel or colon. Numerous colonic diverticulae are noted, without surrounding inflammatory changes to suggest an acute diverticulitis at this time. Normal appendix. Vascular/Lymphatic: Aortic atherosclerosis, without evidence of aneurysm or dissection in the abdominal or pelvic vasculature. No lymphadenopathy noted in the abdomen or pelvis. Reproductive: Endometrial thickness is estimated to be 1 cm. Ovaries are atrophic. Other: No significant volume of ascites.  No pneumoperitoneum. Musculoskeletal: There are no aggressive appearing lytic or blastic lesions noted in the visualized portions of the skeleton. Status post right hip arthroplasty.  IMPRESSION: 1. No acute findings are noted in the abdomen or pelvis to account for the patient's symptoms. 2. Severe hepatic steatosis. 3. Colonic diverticulosis without evidence of acute diverticulitis at this time. 4. Aortic atherosclerosis. 5. **An incidental finding of potential clinical significance has been found. Low-attenuation endometrial thickening up to 10 mm, unusual in a postmenopausal female. Further  evaluation with nonemergent pelvic ultrasound is recommended in the near future to better evaluate these findings, as endometrial hyperplasia or malignancy is not excluded.  CT Head Wo Contrast (06/08/2016) FINDINGS: Brain: Low attenuation throughout the subcortical and periventricular white matter compatible with chronic microvascular disease. Prominence of the sulci and ventricles compatible with brain atrophy. There is no abnormal extra-axial fluid collection, intracranial hemorrhage or mass. No evidence for acute brain infarct. Vascular: No hyperdense vessel or unexpected calcification. Skull: Normal. Negative for fracture or focal lesion. Sinuses/Orbits: No acute finding. Other: None.  IMPRESSION: 1. No acute intracranial abnormalities. 2. Chronic small vessel ischemic change and brain atrophy.  Results/Tests Pending at Time of Discharge: None  Discharge Medications:  Allergies as of 06/11/2016      Reactions   Tape Rash      Medication List    STOP taking these medications   lisinopril 20 MG tablet Commonly known as:  PRINIVIL,ZESTRIL     TAKE these medications   albuterol 108 (90 Base) MCG/ACT inhaler Commonly known as:  PROVENTIL HFA;VENTOLIN HFA Inhale 2 puffs into the lungs every 6 (six) hours as needed for wheezing or shortness of breath.   atorvastatin 40 MG tablet Commonly known as:  LIPITOR Take 1 tablet (40 mg total) by mouth daily.   azithromycin 500 MG tablet Commonly known as:  ZITHROMAX Take 1 tablet (500 mg total) by mouth daily.   busPIRone 10 MG tablet Commonly known as:  BUSPAR Take 2 tablets (20 mg total) by mouth 2 (two) times daily.   diclofenac 75 MG EC tablet Commonly known as:  VOLTAREN TAKE 1 TABLET (75 MG TOTAL) BY MOUTH 2 (TWO) TIMES DAILY.   FISH OIL PO Take 1 capsule by mouth daily.   loperamide 2 MG tablet Commonly known as:  IMODIUM A-D Take 1 tablet (2 mg total) by mouth 4 (four) times daily as needed for diarrhea or loose  stools.   loratadine 10 MG tablet Commonly known as:  CLARITIN TAKE 1 TABLET (10 MG TOTAL) BY MOUTH DAILY.   Vitamin D (Ergocalciferol) 50000 units Caps capsule Commonly known as:  DRISDOL Take 1 capsule (50,000 Units total) by mouth every 7 (seven) days.       Discharge Instructions: Please refer to Patient Instructions section of EMR for full details.  Patient was counseled important signs and symptoms that should prompt return to medical care, changes in medications, dietary instructions, activity restrictions, and follow up appointments.   Follow-Up Appointments: Follow-up Information    Lavon Paganini, MD Follow up on 06/13/2016.   Specialty:  Family Medicine Why:  2:45 PM for hospital follow up  Contact information: [place] [address] [phone]           Norelle Runnion J Leyana Whidden, DO 06/12/2016, 11:18 AM PGY-1, [facility]

## 2016-06-09 NOTE — Care Management Obs Status (Signed)
[place] NOTIFICATION   Patient Details  Name: Alexis Cole MRN: [id number] Date of Birth: Jan 21, 1946   Medicare Observation Status Notification Given:  Yes    Carles Collet, RN 06/09/2016, 4:03 PM

## 2016-06-09 NOTE — Progress Notes (Signed)
Family Medicine Teaching Service Daily Progress Note Intern Pager: [phone]  Patient name: Alexis Cole Medical record number: [id number] Date of birth: 08/05/1945 Age: 71 y.o. Gender: female  Primary Care Provider: Lavon Paganini, MD Consultants: None Code Status: Full  Pt Overview and Major Events to Date:  04/19: Admit for mod chronic hyponatremia in the context of dehydration with LLL PNA CAP  Assessment and Plan: Alexis Cole is a 71 y.o. female presenting with dizziness, generalized weakness, decreased PO intake and intermittent NBNB emesis x 10 days. PMH is significant for anxiety, anemia, arthritis, chronic back pain, depression, migraines, hyperparathryoidism, syncope and sinusitis.  #Chronic moderate hyponatremia with mild symptoms: On admission, Na was 121. She has had decreased PO intake and multiple episodes of NBNB emesis and diarrhea over the last 10 days. CT head negative for acute changes. Symptoms mostly likely 2/2 hypovolemic hyponatremia.  --PM BMP --will determine IVF after BMP results as we do not want to correct Na too quickly  #Generalized weakness: On admission, patient reported chronic, intermittent lower extremity weakness since August 2017. Today her weakness is severe in her bilateral lower extremities, however, it is also present throughout her entire body. Neuro exam non-focal and CT head negative for acute findings.  Of note, she has had nausea, decreased PO intake and multiple episodes of NBNB emesis and diarrhea over the last 10 days. CT abd unremarkable.  Denies any numbness or pain. Mostly likely 2/2 dehydration.  --IVF  --PT/OT --Orthostatic vitals   #Community acquired pneumonia meeting sepsis with lactic acidosis: Acute. Improved. On room air, afebrile. Met sepsis criteria on admission. Without leukocytosis.CXR shows left lower lobe infiltrate consistent with pneumonia with a small left pleural effusion. Symptoms likely due to CAP although I  think her anxiety is also contributing. ED initiated sepsis protocol, with tachycardia, tachypnea, and lactic acidosis. --IV ceftriaxone and PO azithromycin day 2 of 5, s/p Vancomycin and zosyn in the ED --IVF NS'@75mL' /hr  --Blood cx pending --Urine s. pneumo and legionella     #Hypertension: Normotensive in ED. Home antihypertensive medications were recently stopped by her PCP.   #Anion gap metabolic acidosis: Acute. Resolved. Most likely 2/2 infection. CXR as above. s/p 2.6 L fluid in ED.  #Hypokalemia: Acute. Improved. --Replete as needed  #Anxiety: She has a history of anxiety that was treated with Xanex in the past and Buspar currently. Patient often has panic attacks and recently has been experiencing increased stress and anxiety due to her neighbor.  --Buspar 20 mg BID  #Possible syncopal episode: Two and a half weeks ago patient reports that she blacked out at work. No bowel or bladder incontinence. Denies previous syncopal episodes. History of positive orthostatics. Most likely source. --Cardiogram pending  #History of Smoking: She has a history of smoking an unknown amount per day for 7 years, quitting for 9 years and then smoking 1/2 ppd for 15 years. --Nicotine patch   #Endometrial Thickening:Incidental finding on admission. Noted to have 76m stripe on CT abd.  --Recommend outpatient follow up  #Hyperparathyroidism: Chronic. Uncontrolled. Was referred to surgery for possible parathyroidectomy but patient did not go to appointment. PTH in 03/2016 133. Ca today 11.1  FEN/GI: Heart Healthy Prophylaxis: Lovenox SQ  Disposition: Pending resolution of weakness, hyponatremia and improvement of decreased PO intake and emesis.  Subjective:  Patient states she feels much better today. This is greatly improved. Denies shortness of breath or chest pain.  Objective: Temp:  [97.8 F (36.6 C)-99 F (37.2 C)] 99 F (  37.2 C) (04/20 0457) Pulse Rate:  [60-108] 60 (04/20  0457) Resp:  [16-29] 18 (04/20 0457) BP: (102-159)/(58-104) 126/58 (04/20 0457) SpO2:  [92 %-100 %] 92 % (04/20 0457) Physical Exam: General: well nourished, well developed, in no acute distress with non-toxic appearance HEENT: normocephalic, atraumatic, moist mucous membranes CV: regular rate and rhythm without murmurs, rubs, or gallops Lungs: clear to auscultation bilaterally with normal work of breathing Abdomen: soft, non-tender, normoactive bowel sounds Skin: warm, dry, no rashes or lesions, cap refill < 2 seconds Extremities: warm and well perfused, normal tone Neuro: alert and oriented 3 Psych: appropriate mood congruent affect  Laboratory:  Recent Labs Lab 06/08/16 1134 06/09/16 0032  WBC 7.5 7.4  HGB 13.9 11.6*  HCT 38.7 34.0*  PLT 153 130*    Recent Labs Lab 06/08/16 1134 06/08/16 1311 06/08/16 1924 06/09/16 0032  NA 121*  --  126* 128*  K 2.9*  --  3.1* 3.4*  CL 80*  --  87* 90*  CO2 20*  --  24 27  BUN 16  --  14 14  CREATININE 0.89  --  1.01* 0.93  CALCIUM 11.1*  --  9.9 10.1  PROT  --  6.3*  --   --   BILITOT  --  1.5*  --   --   ALKPHOS  --  84  --   --   ALT  --  67*  --   --   AST  --  66*  --   --   GLUCOSE 120*  --  114* 115*   Troponin: Negative Hepatic function panel: Total protein 6.3 (L), AST 66 (H), ALT 67 (H), total bili 1.5 (H), indirect bili 1.0 (H) Lipase: 87 (H) Lactic acid: 5.21 > 2.33 > 1.60 > 1.40 UA: Amber, cloudy, 50 glucose, mod hgb, sm bili, 20 ketones, 100 protein, rare bacteria, 0-5 sqams, mucus present, hyaline casts present, granular casts present Blood culture: Pending 2 Strep pneumo urine: Pending Legionella pneumo urine: Pending HIV: Pending  Imaging/Diagnostic Tests: DG Chest 2 View (06/08/2016) FINDINGS: Mediastinum and hilar structures are normal. Left lower lobe infiltrate consistent with pneumonia. Associated left lower lobe atelectasis. No pleural effusion or pneumothorax. Elevation left hemidiaphragm again  noted. Heart size normal. No acute bony abnormality .  IMPRESSION: 1. Left lower lobe infiltrate consistent pneumonia . Left lower lobe atelectatic changes. Small left pleural effusion. 2.  Elevation left hemidiaphragm again noted.  CT Abdomen Pelvis W Contrast (06/08/2016) FINDINGS: Lower chest: Asymmetric elevation of the left hemidiaphragm, with incompletely visualized lower left lung. Hepatobiliary: Diffuse low attenuation throughout the hepatic parenchyma, compatible with severe hepatic steatosis. No definite cystic or solid hepatic lesions are noted in the visualized portions of the liver (portions of segment 2 extend above the upper margin of the images into the lower left hemithorax). No intra or extrahepatic biliary ductal dilatation. Gallbladder is normal in appearance. Pancreas: No pancreatic mass. No pancreatic ductal dilatation. No pancreatic or peripancreatic fluid or inflammatory changes. Spleen: Visualized portions (superior aspect of the spleen is incompletely visualized) are unremarkable. Adrenals/Urinary Tract: Bilateral kidneys and bilateral adrenal glands are unremarkable in appearance. No hydroureteronephrosis. Urinary bladder is partially obscured by beam hardening artifact from the patient's right hip arthroplasty, but the urinary bladder is essentially unremarkable in appearance. Stomach/Bowel: The appearance of the stomach is normal. No pathologic dilatation of small bowel or colon. Numerous colonic diverticulae are noted, without surrounding inflammatory changes to suggest an acute diverticulitis at this time. Normal  appendix. Vascular/Lymphatic: Aortic atherosclerosis, without evidence of aneurysm or dissection in the abdominal or pelvic vasculature. No lymphadenopathy noted in the abdomen or pelvis. Reproductive: Endometrial thickness is estimated to be 1 cm. Ovaries are atrophic. Other: No significant volume of ascites. No pneumoperitoneum. Musculoskeletal: There are  no aggressive appearing lytic or blastic lesions noted in the visualized portions of the skeleton. Status post right hip arthroplasty.  IMPRESSION: 1. No acute findings are noted in the abdomen or pelvis to account for the patient's symptoms. 2. Severe hepatic steatosis. 3. Colonic diverticulosis without evidence of acute diverticulitis at this time. 4. Aortic atherosclerosis. 5. **An incidental finding of potential clinical significance has been found. Low-attenuation endometrial thickening up to 10 mm, unusual in a postmenopausal female. Further evaluation with nonemergent pelvic ultrasound is recommended in the near future to better evaluate these findings, as endometrial hyperplasia or malignancy is not excluded.**  CT Head Wo Contrast (06/08/2016) FINDINGS: Brain: Low attenuation throughout the subcortical and periventricular white matter compatible with chronic microvascular disease. Prominence of the sulci and ventricles compatible with brain atrophy. There is no abnormal extra-axial fluid collection, intracranial hemorrhage or mass. No evidence for acute brain infarct. Vascular: No hyperdense vessel or unexpected calcification. Skull: Normal. Negative for fracture or focal lesion. Sinuses/Orbits: No acute finding. Other: None.  IMPRESSION: 1. No acute intracranial abnormalities. 2. Chronic small vessel ischemic change and brain atrophy.  Greenleaf Bing, DO 06/09/2016, 7:29 AM PGY-1, [place] Intern pager: [phone], text pages welcome

## 2016-06-09 NOTE — Evaluation (Signed)
Physical Therapy Evaluation Patient Details Name: Alexis Cole MRN: [id number] DOB: [birth year] Today's Date: 06/09/2016   History of Present Illness  Alexis Cole is a 71 y.o. female presenting with dizziness, generalized weakness, pt with LLL CAP and hyponatremia. PMH is significant for anxiety, anemia, arthritis, chronic back pain, depression  Clinical Impression  Pt with flat affect who reports being depressed after work hours cut back and being concerned over not being able to afford food and living. Pt states she hasn't eaten much and just generally lacks desire for mobility recently. Pt with good overall strength but impaired balance with standing and fatigue with mobility. Pt will benefit from acute therapy to maximize mobility, balance and function to increase quality of life and decrease fall risk.Recommend daily ambulation acutely with RW.   HR 97-105 sats 97% on RA throughout    Follow Up Recommendations Home health PT    Equipment Recommendations  Rolling walker with 5" wheels    Recommendations for Other Services       Precautions / Restrictions Precautions Precautions: Fall      Mobility  Bed Mobility Overal bed mobility: Modified Independent                Transfers Overall transfer level: Modified independent                  Ambulation/Gait Ambulation/Gait assistance: Min assist Ambulation Distance (Feet): 200 Feet Assistive device: None;Rolling walker (2 wheeled) Gait Pattern/deviations: Step-through pattern;Decreased stride length   Gait velocity interpretation: Below normal speed for age/gender General Gait Details: pt with unsteady gait without RW and walked without AD except last 15'. Pt with min assist for balance due to veering at times but no overt loss, Pt with improved balance and gait with use of RW and recommend continued use  Stairs            Wheelchair Mobility    Modified Rankin (Stroke Patients Only)        Balance Overall balance assessment: Needs assistance   Sitting balance-Leahy Scale: Good       Standing balance-Leahy Scale: Good                               Pertinent Vitals/Pain Pain Assessment: No/denies pain    Home Living Family/patient expects to be discharged to:: Private residence Living Arrangements: Alone Available Help at Discharge: Friend(s);Available PRN/intermittently Type of Home: Apartment Home Access: Level entry     Home Layout: One level Home Equipment: Cane - single point      Prior Function Level of Independence: Independent         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, political party        Extremity/Trunk Assessment   Upper Extremity Assessment Upper Extremity Assessment: Overall WFL for tasks assessed (pt with 5/5 bil UE strength and no sensation deficits)    Lower Extremity Assessment Lower Extremity Assessment: Overall WFL for tasks assessed (pt with 5/5 bil UE strength and no sensation deficits)    Cervical / Trunk Assessment Cervical / Trunk Assessment: Other exceptions Cervical / Trunk Exceptions: forward head, rounded shoulders  Communication   Communication: No difficulties  Cognition Arousal/Alertness: Awake/alert Behavior During Therapy: Flat affect Overall Cognitive Status: Within Functional Limits for tasks assessed  General Comments      Exercises     Assessment/Plan    PT Assessment Patient needs continued PT services  PT Problem List Decreased activity tolerance;Decreased balance;Decreased knowledge of use of DME       PT Treatment Interventions Gait training;Balance training;Functional mobility training;Patient/family education;DME instruction;Therapeutic activities    PT Goals (Current goals can be found in the Care Plan section)  Acute Rehab PT Goals Patient Stated Goal: return to work PT Goal Formulation: With patient Time For  Goal Achievement: 06/23/16 Potential to Achieve Goals: Good    Frequency Min 3X/week   Barriers to discharge Decreased caregiver support      Co-evaluation               End of Session Equipment Utilized During Treatment: Gait belt Activity Tolerance: Patient tolerated treatment well Patient left: in chair;with call bell/phone within reach;with chair alarm set Nurse Communication: Mobility status PT Visit Diagnosis: Difficulty in walking, not elsewhere classified (R26.2)    Time: 0156-1537 PT Time Calculation (min) (ACUTE ONLY): 21 min   Charges:   PT Evaluation $PT Eval Moderate Complexity: 1 Procedure     PT G Codes:   PT G-Codes **NOT FOR INPATIENT CLASS** Functional Assessment Tool Used: AM-PAC 6 Clicks Basic Mobility Functional Limitation: Mobility: Walking and moving around Mobility: Walking and Moving Around Current Status (H4327): At least 20 percent but less than 40 percent impaired, limited or restricted Mobility: Walking and Moving Around Goal Status [phone]): At least 1 percent but less than 20 percent impaired, limited or restricted    Elwyn Reach, [employer] Cataldo Cosgriff B Cagney Degrace 06/09/2016, 11:00 AM

## 2016-06-09 NOTE — Progress Notes (Signed)
  Echocardiogram 2D Echocardiogram has been performed.  Saarah Dewing L Androw 06/09/2016, 2:10 PM

## 2016-06-09 NOTE — Progress Notes (Signed)
Physical Therapy Treatment Patient Details Name: Alexis Cole MRN: [id number] DOB: 1945-05-31 Today's Date: 06/09/2016    History of Present Illness Alexis Cole is a 71 y.o. female presenting with dizziness, generalized weakness, pt with LLL CAP and hyponatremia. PMH is significant for anxiety, anemia, arthritis, chronic back pain, depression    PT Comments    Pt attempting OOB unassisted.  PTA responded to call bell and assisted patient from bed to bathroom and back.  Pt required cues for safety and assist for line/lead management and balance.  Plan next session for mobility and standing exercises.     Follow Up Recommendations  Home health PT     Equipment Recommendations  Rolling walker with 5" wheels    Recommendations for Other Services       Precautions / Restrictions Precautions Precautions: Fall Restrictions Weight Bearing Restrictions: No    Mobility  Bed Mobility Overal bed mobility: Modified Independent                Transfers Overall transfer level: Needs assistance Equipment used: Rolling walker (2 wheeled) Transfers: Sit to/from Stand Sit to Stand: Supervision         General transfer comment: Cues for safety  Ambulation/Gait Ambulation/Gait assistance: Min guard Ambulation Distance (Feet): 15 Feet (x2 to and from bathroom.  )   Gait Pattern/deviations: Step-through pattern;Decreased stride length;Trunk flexed     General Gait Details: Cues for RW safety and assist for linen lead management.  Pt remains unsteady.  Pt attempting OOB to use the bathroom.  PTA responded to call and assisted patient to toilet.    Stairs            Wheelchair Mobility    Modified Rankin (Stroke Patients Only)       Balance Overall balance assessment: Needs assistance   Sitting balance-Leahy Scale: Good       Standing balance-Leahy Scale: Fair                              Cognition Arousal/Alertness: Awake/alert Behavior  During Therapy: WFL for tasks assessed/performed Overall Cognitive Status: Within Functional Limits for tasks assessed                                        Exercises      General Comments        Pertinent Vitals/Pain Pain Assessment: Faces Faces Pain Scale: Hurts a little bit Pain Location: reports burning near rectum from multiple stools.   Pain Descriptors / Indicators: Burning Pain Intervention(s): Monitored during session;Repositioned (applied barrier cream to protect skin integrity.  )    Home Living                      Prior Function            PT Goals (current goals can now be found in the care plan section) Acute Rehab PT Goals Patient Stated Goal: return to work Potential to Achieve Goals: Good Progress towards PT goals: Progressing toward goals    Frequency    Min 3X/week      PT Plan Current plan remains appropriate    Co-evaluation             End of Session Equipment Utilized During Treatment: Gait belt Activity Tolerance: Patient tolerated treatment well Patient  left: in chair;with call bell/phone within reach;with chair alarm set Nurse Communication: Mobility status PT Visit Diagnosis: Difficulty in walking, not elsewhere classified (R26.2)     Time: 4290-3795 PT Time Calculation (min) (ACUTE ONLY): 13 min  Charges:  $Therapeutic Activity: 8-22 mins                    G Codes:       Governor Rooks, PTA pager [place] 06/09/2016, 4:05 PM

## 2016-06-09 NOTE — Progress Notes (Signed)
Nutrition Brief Note  Patient identified on the Malnutrition Screening Tool (MST) Report  Wt Readings from Last 15 Encounters:  05/26/16 190 lb (86.2 kg)  04/27/16 195 lb (88.5 kg)  04/12/16 192 lb 3.2 oz (87.2 kg)  03/20/16 198 lb (89.8 kg)  02/24/16 197 lb (89.4 kg)  01/12/16 199 lb (90.3 kg)  11/17/15 198 lb (89.8 kg)  09/13/15 194 lb 6.4 oz (88.2 kg)  08/23/15 199 lb (90.3 kg)  04/21/15 207 lb (93.9 kg)  02/01/15 203 lb (92.1 kg)  01/01/15 200 lb (90.7 kg)  08/13/14 194 lb (88 kg)  07/13/14 197 lb (89.4 kg)  06/24/14 194 lb (88 kg)   Alexis Cole is a 71 y.o. female presenting with dizziness, generalized weakness, decreased PO intake and intermittent NBNB emesis x 10 days. PMH is significant for anxiety, anemia, arthritis, chronic back pain, depression, migraines, hyperparathryoidism, syncope and sinusitis.  Pt admitted with chronic hyponatremia.   Pt with flat affect and did not engage much with this RD at time of visit. She reports she's been depressed over the past two weeks. She states she has been trying to eat more healthfully by eating more fruits and vegetables. She thinks she may have lost " a few pounds", but refused to elaborate despite probing.  Nutrition-Focused physical exam completed. Findings are no fat depletion, no muscle depletion, and no edema.   Estimated body mass index is 30.67 kg/m as calculated from the following:   Height as of 05/26/16: 5\' 6"  (1.676 m).   Weight as of 05/26/16: 190 lb (86.2 kg). Patient meets criteria for obesity, class I based on current BMI.   Current diet order is Heart Healthy, patient is consuming approximately 80-100% of meals at this time. Labs and medications reviewed.   No nutrition interventions warranted at this time. If nutrition issues arise, please consult RD.   Francis Doenges A. Jimmye Norman, RD, LDN, CDE Pager: [phone] After hours Pager: [phone]

## 2016-06-09 NOTE — Progress Notes (Signed)
Patient arrived to the unit via bed from the emergency room.  Patient is alert and oriented x4.  No complaints of pain.  Skin assessment performed.  Scratches to the right lower leg.  Patient stated "I'm  training my puppy at home".   Patient groin area is red in the fold areas.  IV intact to the right antecubital.  Patient unsteady while ambulating.  Educated the patient on the importance of bed alarm due to gait.  Educated the patient on how to reach the staff on the unit. Lowered the bed, activated the bed alarm and placed the call bell within reach.  Will continue to monitor the patient.

## 2016-06-10 DIAGNOSIS — F411 Generalized anxiety disorder: Secondary | ICD-10-CM | POA: Diagnosis present

## 2016-06-10 DIAGNOSIS — E213 Hyperparathyroidism, unspecified: Secondary | ICD-10-CM | POA: Diagnosis present

## 2016-06-10 DIAGNOSIS — E872 Acidosis: Secondary | ICD-10-CM | POA: Diagnosis present

## 2016-06-10 DIAGNOSIS — R197 Diarrhea, unspecified: Secondary | ICD-10-CM

## 2016-06-10 DIAGNOSIS — E871 Hypo-osmolality and hyponatremia: Secondary | ICD-10-CM

## 2016-06-10 DIAGNOSIS — F329 Major depressive disorder, single episode, unspecified: Secondary | ICD-10-CM | POA: Diagnosis present

## 2016-06-10 DIAGNOSIS — J189 Pneumonia, unspecified organism: Secondary | ICD-10-CM | POA: Diagnosis present

## 2016-06-10 DIAGNOSIS — Z96641 Presence of right artificial hip joint: Secondary | ICD-10-CM | POA: Diagnosis present

## 2016-06-10 DIAGNOSIS — R531 Weakness: Secondary | ICD-10-CM | POA: Diagnosis present

## 2016-06-10 DIAGNOSIS — M1990 Unspecified osteoarthritis, unspecified site: Secondary | ICD-10-CM | POA: Diagnosis present

## 2016-06-10 DIAGNOSIS — R000 Tachycardia, unspecified: Secondary | ICD-10-CM | POA: Diagnosis present

## 2016-06-10 DIAGNOSIS — E860 Dehydration: Secondary | ICD-10-CM | POA: Diagnosis present

## 2016-06-10 DIAGNOSIS — I10 Essential (primary) hypertension: Secondary | ICD-10-CM | POA: Diagnosis present

## 2016-06-10 DIAGNOSIS — E785 Hyperlipidemia, unspecified: Secondary | ICD-10-CM | POA: Diagnosis present

## 2016-06-10 DIAGNOSIS — F064 Anxiety disorder due to known physiological condition: Secondary | ICD-10-CM | POA: Diagnosis present

## 2016-06-10 DIAGNOSIS — K5730 Diverticulosis of large intestine without perforation or abscess without bleeding: Secondary | ICD-10-CM | POA: Diagnosis present

## 2016-06-10 DIAGNOSIS — E861 Hypovolemia: Secondary | ICD-10-CM | POA: Diagnosis not present

## 2016-06-10 DIAGNOSIS — Z91048 Other nonmedicinal substance allergy status: Secondary | ICD-10-CM | POA: Diagnosis not present

## 2016-06-10 DIAGNOSIS — M545 Low back pain: Secondary | ICD-10-CM | POA: Diagnosis present

## 2016-06-10 DIAGNOSIS — F17210 Nicotine dependence, cigarettes, uncomplicated: Secondary | ICD-10-CM | POA: Diagnosis present

## 2016-06-10 DIAGNOSIS — G8929 Other chronic pain: Secondary | ICD-10-CM | POA: Diagnosis present

## 2016-06-10 DIAGNOSIS — K760 Fatty (change of) liver, not elsewhere classified: Secondary | ICD-10-CM | POA: Diagnosis present

## 2016-06-10 DIAGNOSIS — I700 Atherosclerosis of aorta: Secondary | ICD-10-CM | POA: Diagnosis present

## 2016-06-10 DIAGNOSIS — E876 Hypokalemia: Secondary | ICD-10-CM | POA: Diagnosis present

## 2016-06-10 DIAGNOSIS — Z79899 Other long term (current) drug therapy: Secondary | ICD-10-CM | POA: Diagnosis not present

## 2016-06-10 LAB — CBC
HEMATOCRIT: 32.4 % — AB (ref 36.0–46.0)
HEMOGLOBIN: 11 g/dL — AB (ref 12.0–15.0)
MCH: 32.9 pg (ref 26.0–34.0)
MCHC: 34 g/dL (ref 30.0–36.0)
MCV: 97 fL (ref 78.0–100.0)
Platelets: 136 10*3/uL — ABNORMAL LOW (ref 150–400)
RBC: 3.34 MIL/uL — ABNORMAL LOW (ref 3.87–5.11)
RDW: 13.7 % (ref 11.5–15.5)
WBC: 5.9 10*3/uL (ref 4.0–10.5)

## 2016-06-10 LAB — BASIC METABOLIC PANEL
ANION GAP: 9 (ref 5–15)
BUN: 6 mg/dL (ref 6–20)
CALCIUM: 10.9 mg/dL — AB (ref 8.9–10.3)
CO2: 26 mmol/L (ref 22–32)
Chloride: 97 mmol/L — ABNORMAL LOW (ref 101–111)
Creatinine, Ser: 0.76 mg/dL (ref 0.44–1.00)
GFR calc Af Amer: >60 mL/min (ref >60)
GFR calc non Af Amer: >60 mL/min (ref >60)
GLUCOSE: 131 mg/dL — AB (ref 65–99)
POTASSIUM: 3.3 mmol/L — AB (ref 3.5–5.1)
Sodium: 132 mmol/L — ABNORMAL LOW (ref 135–145)

## 2016-06-10 LAB — C DIFFICILE QUICK SCREEN W PCR REFLEX
C Diff antigen: NEGATIVE
C Diff interpretation: NOT DETECTED
C Diff toxin: NEGATIVE

## 2016-06-10 LAB — OCCULT BLOOD X 1 CARD TO LAB, STOOL: Fecal Occult Bld: NEGATIVE

## 2016-06-10 LAB — LEGIONELLA PNEUMOPHILA SEROGP 1 UR AG: L. PNEUMOPHILA SEROGP 1 UR AG: NEGATIVE

## 2016-06-10 MED ORDER — TRAZODONE HCL 50 MG PO TABS: 25.0000 mg | ORAL_TABLET | Freq: Every evening | ORAL | Status: DC | PRN

## 2016-06-10 MED ORDER — POTASSIUM CHLORIDE CRYS ER 20 MEQ PO TBCR
40.0000 meq | EXTENDED_RELEASE_TABLET | Freq: Two times a day (BID) | ORAL | Status: AC
  Administered 2016-06-10 (×2): 40 meq via ORAL
  Filled 2016-06-10 (×2): qty 2

## 2016-06-10 MED ORDER — ACETAMINOPHEN 500 MG PO TABS
500.0000 mg | ORAL_TABLET | Freq: Four times a day (QID) | ORAL | Status: DC
  Administered 2016-06-10 – 2016-06-11 (×6): 500 mg via ORAL
  Filled 2016-06-10 (×6): qty 1

## 2016-06-10 NOTE — Progress Notes (Signed)
Family Medicine Teaching Service Daily Progress Note Intern Pager: [phone]  Patient name: Alexis Cole Medical record number: [id number] Date of birth: 07/09/1945 Age: 71 y.o. Gender: female  Primary Care Provider: Lavon Paganini, MD Consultants: None Code Status: Full  Pt Overview and Major Events to Date:  04/19: Admit for mod chronic hyponatremia in the context of dehydration with LLL PNA CAP  Assessment and Plan: Alexis Cole is a 71 y.o. female presenting with dizziness, generalized weakness, decreased PO intake and intermittent NBNB emesis x 10 days. PMH is significant for anxiety, anemia, arthritis, chronic back pain, depression, migraines, hyperparathryoidism, syncope and sinusitis.  #Chronic moderate hyponatremia with mild symptoms, improving:  Symptoms mostly likely 2/2 hypovolemic hyponatremia. Na 132 from 130 -- BMP daily -- will discontinue IVF since eating well and monitor  #Generalized weakness, improving: On admission, patient reported chronic, intermittent lower extremity weakness since August 2017.  Neuro exam non-focal and CT head negative for acute findings.  Of note, she has had nausea, decreased PO intake and multiple episodes of NBNB emesis and diarrhea over the last 10 days. CT abd unremarkable.  Denies any numbness or pain. Mostly likely 2/2 dehydration (positive orthostatic vitals).  --PT/OT: HHPT, rolling walker (ordered)    #Community acquired pneumonia meeting sepsis with lactic acidosis, LA resolved: Acute. Improved. On room air, afebrile. Met sepsis criteria on admission. Urine strep PNA negative  --IV ceftriaxone and PO azithromycin day 3 of 5, s/p Vancomycin and zosyn in the ED --Blood cx NGTD --Urine legionella pending  Loose Stools:  noted overnight. None this morning. Patient reports this may be due to drinking shake as she is lactose intolerant.  - c.diff negative - consider stool study if continues  - monitor  #Hypertension:  Normotensive in ED. Home antihypertensive medications were recently stopped by her PCP. BP stable  #Hypokalemia: Acute. Improved but still low at 3.3  - kdur 82mq x 2 doses --Replete as needed  #Anxiety: She has a history of anxiety that was treated with Xanex in the past and Buspar currently. Patient often has panic attacks and recently has been experiencing increased stress and anxiety due to her neighbor.  --Buspar 20 mg BID  #Possible syncopal episode: Two and a half weeks ago patient reports that she blacked out at work. No bowel or bladder incontinence. Denies previous syncopal episodes. History of positive orthostatics. Most likely source. ECHO with normal EF, normal wall motion, G1DD. Normal Telemetry  #History of Smoking: She has a history of smoking an unknown amount per day for 7 years, quitting for 9 years and then smoking 1/2 ppd for 15 years. --Nicotine patch   #Endometrial Thickening:Incidental finding on admission. Noted to have 184mstripe on CT abd.  --Recommend outpatient follow up  #Hyperparathyroidism: Chronic. Uncontrolled. Was referred to surgery for possible parathyroidectomy but patient did not go to appointment. PTH in 03/2016 133.   FEN/GI: Heart Healthy Prophylaxis: Lovenox SQ  Disposition: Likely 4/22  Subjective:  Patient states she feels better today but has some general weakness. Denies shortness of breath or chest pain.  Objective: Temp:  [97.9 F (36.6 C)-98.6 F (37 C)] 98.2 F (36.8 C) (04/21 0546) Pulse Rate:  [92-97] 92 (04/21 0546) Resp:  [18-20] 20 (04/21 0546) BP: (109-129)/(62-81) 129/81 (04/21 0546) SpO2:  [92 %-100 %] 100 % (04/21 0546) Physical Exam: General: well nourished, well developed, in no acute distress with non-toxic appearance HEENT: normocephalic, atraumatic, moist mucous membranes CV: regular rate and rhythm without murmurs, rubs, or  gallops Lungs: clear to auscultation bilaterally with normal work of  breathing Abdomen: soft, non-tender, normoactive bowel sounds Skin: warm, dry, no rashes or lesions, cap refill < 2 seconds Extremities: warm and well perfused, normal tone Neuro: alert and oriented 3 Psych: appropriate mood congruent affect  Laboratory:  Recent Labs Lab 06/08/16 1134 06/09/16 0032 06/10/16 0640  WBC 7.5 7.4 5.9  HGB 13.9 11.6* 11.0*  HCT 38.7 34.0* 32.4*  PLT 153 130* 136*    Recent Labs Lab 06/08/16 1311  06/09/16 1014 06/09/16 2117 06/10/16 0640  NA  --   < > 130* 130* 132*  K  --   < > 3.1* 3.1* 3.3*  CL  --   < > 96* 96* 97*  CO2  --   < > '23 25 26  ' BUN  --   < > '12 10 6  ' CREATININE  --   < > 0.88 0.85 0.76  CALCIUM  --   < > 10.5* 10.5* 10.9*  PROT 6.3*  --   --   --   --   BILITOT 1.5*  --   --   --   --   ALKPHOS 84  --   --   --   --   ALT 67*  --   --   --   --   AST 66*  --   --   --   --   GLUCOSE  --   < > 120* 168* 131*  < > = values in this interval not displayed. Troponin: Negative Hepatic function panel: Total protein 6.3 (L), AST 66 (H), ALT 67 (H), total bili 1.5 (H), indirect bili 1.0 (H) Lipase: 87 (H) Lactic acid: 5.21 > 2.33 > 1.60 > 1.40 UA: Amber, cloudy, 50 glucose, mod hgb, sm bili, 20 ketones, 100 protein, rare bacteria, 0-5 sqams, mucus present, hyaline casts present, granular casts present Blood culture: Pending 2 Strep pneumo urine: Pending Legionella pneumo urine: Pending HIV: Pending  Imaging/Diagnostic Tests: DG Chest 2 View (06/08/2016) FINDINGS: Mediastinum and hilar structures are normal. Left lower lobe infiltrate consistent with pneumonia. Associated left lower lobe atelectasis. No pleural effusion or pneumothorax. Elevation left hemidiaphragm again noted. Heart size normal. No acute bony abnormality .  IMPRESSION: 1. Left lower lobe infiltrate consistent pneumonia . Left lower lobe atelectatic changes. Small left pleural effusion. 2.  Elevation left hemidiaphragm again noted.  CT Abdomen Pelvis W  Contrast (06/08/2016) FINDINGS: Lower chest: Asymmetric elevation of the left hemidiaphragm, with incompletely visualized lower left lung. Hepatobiliary: Diffuse low attenuation throughout the hepatic parenchyma, compatible with severe hepatic steatosis. No definite cystic or solid hepatic lesions are noted in the visualized portions of the liver (portions of segment 2 extend above the upper margin of the images into the lower left hemithorax). No intra or extrahepatic biliary ductal dilatation. Gallbladder is normal in appearance. Pancreas: No pancreatic mass. No pancreatic ductal dilatation. No pancreatic or peripancreatic fluid or inflammatory changes. Spleen: Visualized portions (superior aspect of the spleen is incompletely visualized) are unremarkable. Adrenals/Urinary Tract: Bilateral kidneys and bilateral adrenal glands are unremarkable in appearance. No hydroureteronephrosis. Urinary bladder is partially obscured by beam hardening artifact from the patient's right hip arthroplasty, but the urinary bladder is essentially unremarkable in appearance. Stomach/Bowel: The appearance of the stomach is normal. No pathologic dilatation of small bowel or colon. Numerous colonic diverticulae are noted, without surrounding inflammatory changes to suggest an acute diverticulitis at this time. Normal appendix. Vascular/Lymphatic: Aortic atherosclerosis, without evidence  of aneurysm or dissection in the abdominal or pelvic vasculature. No lymphadenopathy noted in the abdomen or pelvis. Reproductive: Endometrial thickness is estimated to be 1 cm. Ovaries are atrophic. Other: No significant volume of ascites. No pneumoperitoneum. Musculoskeletal: There are no aggressive appearing lytic or blastic lesions noted in the visualized portions of the skeleton. Status post right hip arthroplasty.  IMPRESSION: 1. No acute findings are noted in the abdomen or pelvis to account for the patient's symptoms. 2. Severe  hepatic steatosis. 3. Colonic diverticulosis without evidence of acute diverticulitis at this time. 4. Aortic atherosclerosis. 5. **An incidental finding of potential clinical significance has been found. Low-attenuation endometrial thickening up to 10 mm, unusual in a postmenopausal female. Further evaluation with nonemergent pelvic ultrasound is recommended in the near future to better evaluate these findings, as endometrial hyperplasia or malignancy is not excluded.  CT Head Wo Contrast (06/08/2016) FINDINGS: Brain: Low attenuation throughout the subcortical and periventricular white matter compatible with chronic microvascular disease. Prominence of the sulci and ventricles compatible with brain atrophy. There is no abnormal extra-axial fluid collection, intracranial hemorrhage or mass. No evidence for acute brain infarct. Vascular: No hyperdense vessel or unexpected calcification. Skull: Normal. Negative for fracture or focal lesion. Sinuses/Orbits: No acute finding. Other: None.  IMPRESSION: 1. No acute intracranial abnormalities. 2. Chronic small vessel ischemic change and brain atrophy.  Smiley Houseman, MD 06/10/2016, 9:29 AM PGY-2, [facility] Intern pager: [phone], text pages welcome

## 2016-06-10 NOTE — Progress Notes (Addendum)
Patient has had 6 loose stools. Patient requesting medication for diarrhea. Family medicine teaching services notified at [phone]. No new orders at this time. Will continue to monitor and treat per MD orders. Feng,MD returned page at [phone] and placed order for C Diff PCR and placed patient on enteric precautions. Will follow MD orders at this time.

## 2016-06-11 LAB — CBC
HEMATOCRIT: 30.4 % — AB (ref 36.0–46.0)
Hemoglobin: 10.2 g/dL — ABNORMAL LOW (ref 12.0–15.0)
MCH: 32.5 pg (ref 26.0–34.0)
MCHC: 33.6 g/dL (ref 30.0–36.0)
MCV: 96.8 fL (ref 78.0–100.0)
Platelets: 145 10*3/uL — ABNORMAL LOW (ref 150–400)
RBC: 3.14 MIL/uL — ABNORMAL LOW (ref 3.87–5.11)
RDW: 13.9 % (ref 11.5–15.5)
WBC: 5.9 10*3/uL (ref 4.0–10.5)

## 2016-06-11 LAB — MAGNESIUM: Magnesium: 1.4 mg/dL — ABNORMAL LOW (ref 1.7–2.4)

## 2016-06-11 LAB — BASIC METABOLIC PANEL
Anion gap: 11 (ref 5–15)
Anion gap: 5 (ref 5–15)
CHLORIDE: 101 mmol/L (ref 101–111)
CHLORIDE: 108 mmol/L (ref 101–111)
CO2: 20 mmol/L — ABNORMAL LOW (ref 22–32)
CO2: 22 mmol/L (ref 22–32)
CREATININE: 0.69 mg/dL (ref 0.44–1.00)
Calcium: 10.5 mg/dL — ABNORMAL HIGH (ref 8.9–10.3)
Calcium: 10.4 mg/dL — ABNORMAL HIGH (ref 8.9–10.3)
Creatinine, Ser: 0.78 mg/dL (ref 0.44–1.00)
GFR calc Af Amer: >60 mL/min (ref >60)
GFR calc Af Amer: >60 mL/min (ref >60)
GFR calc non Af Amer: >60 mL/min (ref >60)
GFR calc non Af Amer: >60 mL/min (ref >60)
GLUCOSE: 107 mg/dL — AB (ref 65–99)
GLUCOSE: 136 mg/dL — AB (ref 65–99)
POTASSIUM: 3.4 mmol/L — AB (ref 3.5–5.1)
POTASSIUM: 4 mmol/L (ref 3.5–5.1)
SODIUM: 132 mmol/L — AB (ref 135–145)
Sodium: 135 mmol/L (ref 135–145)

## 2016-06-11 MED ORDER — LOPERAMIDE HCL 2 MG PO CAPS
4.0000 mg | ORAL_CAPSULE | Freq: Once | ORAL | Status: AC
  Administered 2016-06-11: 4 mg via ORAL
  Filled 2016-06-11: qty 2

## 2016-06-11 MED ORDER — LOPERAMIDE HCL 2 MG PO TABS: 2.0000 mg | ORAL_TABLET | Freq: Four times a day (QID) | ORAL | 0 refills | Status: DC | PRN

## 2016-06-11 MED ORDER — MAGNESIUM SULFATE 2 GM/50ML IV SOLN
2.0000 g | Freq: Once | INTRAVENOUS | Status: AC
  Administered 2016-06-11: 2 g via INTRAVENOUS
  Filled 2016-06-11: qty 50

## 2016-06-11 MED ORDER — POTASSIUM CHLORIDE CRYS ER 20 MEQ PO TBCR
40.0000 meq | EXTENDED_RELEASE_TABLET | Freq: Once | ORAL | Status: AC
  Administered 2016-06-11: 40 meq via ORAL
  Filled 2016-06-11: qty 2

## 2016-06-11 MED ORDER — AZITHROMYCIN 500 MG PO TABS: 500.0000 mg | ORAL_TABLET | ORAL | 0 refills | Status: DC

## 2016-06-11 NOTE — Progress Notes (Signed)
Family Medicine Teaching Service Daily Progress Note Intern Pager: [phone]  Patient name: Alexis Cole Medical record number: [id number] Date of birth: Jul 20, 1945 Age: 71 y.o. Gender: female  Primary Care Provider: Lavon Paganini, MD Consultants: None Code Status: Full  Pt Overview and Major Events to Date:  04/19: Admit for mod chronic hyponatremia in the context of dehydration with LLL PNA CAP  Assessment and Plan: Alexis Cole is a 71 y.o. female presenting with dizziness, generalized weakness, decreased PO intake and intermittent NBNB emesis x 10 days. PMH is significant for anxiety, anemia, arthritis, chronic back pain, depression, migraines, hyperparathryoidism, syncope and sinusitis.  #Chronic moderate hyponatremia with mild symptoms, improving:  Symptoms most likely 2/2 hypovolemic hyponatremia. Na stable  -- BMP daily  #Generalized weakness, improving: Mostly likely 2/2 dehydration (positive orthostatic vitals).  --PT/OT: HHPT, rolling walker (ordered)    #Community acquired pneumonia meeting sepsis with lactic acidosis, LA resolved: Acute. Improved. On room air, afebrile.  Urine strep PNA negative. Urine legionella negative --IV ceftriaxone and PO azithromycin day 4 of 5, s/p Vancomycin and zosyn in the ED --Blood cx NG x 2 days  Loose Stools:  for the past two weeks. Different from her symptoms when she is nervous. FOBT negative. c.diff negative - stool studies and fecal lactoferrin pending  #Hypertension:  Home antihypertensive medications were recently stopped by her PCP. BP stable  #Hypokalemia: Acute. Possibly due to diarrhea. Received total of 167mEq of Kdur yesterday with only improvement from 3.3 to 3.4 - kdur 57meq --Replete as needed  #Hypomagnesemia: 1.4 today from 1.8 on 4/20. - IV mag 2g x 1   #Anxiety: She has a history of anxiety that was treated with Xanex in the past and Buspar currently. Patient often has panic attacks and recently has  been experiencing increased stress and anxiety due to her neighbor.  --Buspar 20 mg BID  #Possible syncopal episode: Two and a half weeks ago patient reports that she blacked out at work. No bowel or bladder incontinence. Denies previous syncopal episodes. History of positive orthostatics. Most likely source. ECHO with normal EF, normal wall motion, G1DD. Normal Telemetry  #History of Smoking: She has a history of smoking an unknown amount per day for 7 years, quitting for 9 years and then smoking 1/2 ppd for 15 years. --Nicotine patch   #Endometrial Thickening:Incidental finding on admission. Noted to have 69mm stripe on CT abd.  --Recommend outpatient follow up  #Hyperparathyroidism: Chronic. Uncontrolled. Was referred to surgery for possible parathyroidectomy but patient did not go to appointment. PTH in 03/2016 133.   FEN/GI: Heart Healthy Prophylaxis: Lovenox SQ  Disposition: pending; need to discuss with attending   Subjective:  Doing okay. Still having diarrhea. Has not provided sample for GI pathogen panel though. No recent travel, does not drink well water, no abdominal pain. Reports she has episodes of diarrhea when she is nervous but her symptoms for the past two weeks or so have been different from her usual symptoms. Reports she has to go home today because there is no one there to take care of her dog.   Objective: Temp:  [98.4 F (36.9 C)-98.6 F (37 C)] 98.4 F (36.9 C) (04/22 0538) Pulse Rate:  [87-91] 91 (04/22 0538) Resp:  [18-20] 20 (04/22 0538) BP: (127-152)/(74-86) 152/86 (04/22 0538) SpO2:  [97 %-99 %] 97 % (04/22 0538) Physical Exam: General: well nourished, well developed, in no acute distress with non-toxic appearance HEENT: normocephalic, atraumatic, moist mucous membranes CV: regular rate and  rhythm without murmurs, rubs, or gallops Lungs: clear to auscultation bilaterally with normal work of breathing Abdomen: soft, non-tender, normoactive bowel  sounds Skin: warm, dry, no rashes or lesions, cap refill < 2 seconds Extremities: warm and well perfused, normal tone Neuro: alert and oriented 3 Psych: appropriate mood congruent affect  Laboratory:  Recent Labs Lab 06/08/16 1134 06/09/16 0032 06/10/16 0640  WBC 7.5 7.4 5.9  HGB 13.9 11.6* 11.0*  HCT 38.7 34.0* 32.4*  PLT 153 130* 136*    Recent Labs Lab 06/08/16 1311  06/09/16 1014 06/09/16 2117 06/10/16 0640  NA  --   < > 130* 130* 132*  K  --   < > 3.1* 3.1* 3.3*  CL  --   < > 96* 96* 97*  CO2  --   < > 23 25 26   BUN  --   < > 12 10 6   CREATININE  --   < > 0.88 0.85 0.76  CALCIUM  --   < > 10.5* 10.5* 10.9*  PROT 6.3*  --   --   --   --   BILITOT 1.5*  --   --   --   --   ALKPHOS 84  --   --   --   --   ALT 67*  --   --   --   --   AST 66*  --   --   --   --   GLUCOSE  --   < > 120* 168* 131*  < > = values in this interval not displayed. Troponin: Negative Hepatic function panel: Total protein 6.3 (L), AST 66 (H), ALT 67 (H), total bili 1.5 (H), indirect bili 1.0 (H) Lipase: 87 (H) Lactic acid: 5.21 > 2.33 > 1.60 > 1.40 UA: Amber, cloudy, 50 glucose, mod hgb, sm bili, 20 ketones, 100 protein, rare bacteria, 0-5 sqams, mucus present, hyaline casts present, granular casts present Blood culture: NG2 Strep pneumo urine: negative Legionella pneumo urine: negative HIV: nonreactive  Imaging/Diagnostic Tests: DG Chest 2 View (06/08/2016) FINDINGS: Mediastinum and hilar structures are normal. Left lower lobe infiltrate consistent with pneumonia. Associated left lower lobe atelectasis. No pleural effusion or pneumothorax. Elevation left hemidiaphragm again noted. Heart size normal. No acute bony abnormality .  IMPRESSION: 1. Left lower lobe infiltrate consistent pneumonia . Left lower lobe atelectatic changes. Small left pleural effusion. 2.  Elevation left hemidiaphragm again noted.  CT Abdomen Pelvis W Contrast (06/08/2016) FINDINGS: Lower chest: Asymmetric  elevation of the left hemidiaphragm, with incompletely visualized lower left lung. Hepatobiliary: Diffuse low attenuation throughout the hepatic parenchyma, compatible with severe hepatic steatosis. No definite cystic or solid hepatic lesions are noted in the visualized portions of the liver (portions of segment 2 extend above the upper margin of the images into the lower left hemithorax). No intra or extrahepatic biliary ductal dilatation. Gallbladder is normal in appearance. Pancreas: No pancreatic mass. No pancreatic ductal dilatation. No pancreatic or peripancreatic fluid or inflammatory changes. Spleen: Visualized portions (superior aspect of the spleen is incompletely visualized) are unremarkable. Adrenals/Urinary Tract: Bilateral kidneys and bilateral adrenal glands are unremarkable in appearance. No hydroureteronephrosis. Urinary bladder is partially obscured by beam hardening artifact from the patient's right hip arthroplasty, but the urinary bladder is essentially unremarkable in appearance. Stomach/Bowel: The appearance of the stomach is normal. No pathologic dilatation of small bowel or colon. Numerous colonic diverticulae are noted, without surrounding inflammatory changes to suggest an acute diverticulitis at this time. Normal appendix. Vascular/Lymphatic:  Aortic atherosclerosis, without evidence of aneurysm or dissection in the abdominal or pelvic vasculature. No lymphadenopathy noted in the abdomen or pelvis. Reproductive: Endometrial thickness is estimated to be 1 cm. Ovaries are atrophic. Other: No significant volume of ascites. No pneumoperitoneum. Musculoskeletal: There are no aggressive appearing lytic or blastic lesions noted in the visualized portions of the skeleton. Status post right hip arthroplasty.  IMPRESSION: 1. No acute findings are noted in the abdomen or pelvis to account for the patient's symptoms. 2. Severe hepatic steatosis. 3. Colonic diverticulosis without  evidence of acute diverticulitis at this time. 4. Aortic atherosclerosis. 5. **An incidental finding of potential clinical significance has been found. Low-attenuation endometrial thickening up to 10 mm, unusual in a postmenopausal female. Further evaluation with nonemergent pelvic ultrasound is recommended in the near future to better evaluate these findings, as endometrial hyperplasia or malignancy is not excluded.  CT Head Wo Contrast (06/08/2016) FINDINGS: Brain: Low attenuation throughout the subcortical and periventricular white matter compatible with chronic microvascular disease. Prominence of the sulci and ventricles compatible with brain atrophy. There is no abnormal extra-axial fluid collection, intracranial hemorrhage or mass. No evidence for acute brain infarct. Vascular: No hyperdense vessel or unexpected calcification. Skull: Normal. Negative for fracture or focal lesion. Sinuses/Orbits: No acute finding. Other: None.  IMPRESSION: 1. No acute intracranial abnormalities. 2. Chronic small vessel ischemic change and brain atrophy.  Smiley Houseman, MD 06/11/2016, 6:54 AM PGY-2, [place] Intern pager: [phone], text pages welcome

## 2016-06-11 NOTE — Progress Notes (Deleted)
FPTS Interim Progress Note  S:Paged by RN that patient endorsing new swelling in lower back. Went to bedside. Patient states she hasn't noticed swelling until today. It is non tender and "feels like jello". Denies any trauma to the area, has only been laying in bed.  O: BP (!) 152/86 (BP Location: Left Arm)   Pulse 91   Temp 98.4 F (36.9 C)   Resp 20   Ht 5\' 6"  (1.676 m)   SpO2 97%   General: In NAD, sitting up in bed watching TV Back: No erythema or warmth. Swelling in bilateral paraspinal lumbar areas, swelling extends to flanks. Non tender to palpation. Feels firm, non fluctuant.   A/P: Lower back swelling: Unclear etiology. No signs of trauma or infection. Could be due to fluid accumulation vs dependent edema from IVF although this would be an atypical place for that. Although less likely, concerned for possible hematomas in back, although would suspect this would be more tender.  - Will order lower back U/S - Continue to monitor  Carlyle Dolly, MD 06/11/2016, 1:13 PM PGY-2, [facility] pager [phone]

## 2016-06-11 NOTE — Discharge Instructions (Signed)
You were in the hospital due to low sodium level and low potassium level likely due to dehydration. Dehydration can happen when you can't drink enough and you have diarrhea. You were also treated for pneumonia with antibiotics.   Please continue to drink plenty of fluids when you get home. You can take the imodium medication for diarrhea as prescribed. If you continue to have diarrhea and are not able to keep down any fluids tonight, please come back to the hospital. Otherwise, you have an appointment with Dr. Jacinto Reap at [facility] on 4/24 (see above).   You will also need to take 1 more day of antibiotics for your pneumonia, that medication has been sent to your pharmacy. Take care!    Diarrhea, Adult Diarrhea is when you have loose and water poop (stool) often. Diarrhea can make you feel weak and cause you to get dehydrated. Dehydration can make you tired and thirsty, make you have a dry mouth, and make it so you pee (urinate) less often. Diarrhea often lasts 2-3 days. However, it can last longer if it is a sign of something more serious. It is important to treat your diarrhea as told by your doctor. Follow these instructions at home: Eating and drinking   Follow these recommendations as told by your doctor:  Take an oral rehydration solution (ORS). This is a drink that is sold at pharmacies and stores.  Drink clear fluids, such as:  Water.  Ice chips.  Diluted fruit juice.  Low-calorie sports drinks.  Eat bland, easy-to-digest foods in small amounts as you are able. These foods include:  Bananas.  Applesauce.  Rice.  Low-fat (lean) meats.  Toast.  Crackers.  Avoid drinking fluids that have a lot of sugar or caffeine in them.  Avoid alcohol.  Avoid spicy or fatty foods. General instructions    Drink enough fluid to keep your pee (urine) clear or pale yellow.  Wash your hands often. If you cannot use soap and water, use hand sanitizer.  Make sure that  all people in your home wash their hands well and often.  Take over-the-counter and prescription medicines only as told by your doctor.  Rest at home while you get better.  Watch your condition for any changes.  Take a warm bath to help with any burning or pain from having diarrhea.  Keep all follow-up visits as told by your doctor. This is important. Contact a doctor if:  You have a fever.  Your diarrhea gets worse.  You have new symptoms.  You cannot keep fluids down.  You feel light-headed or dizzy.  You have a headache.  You have muscle cramps. Get help right away if:  You have chest pain.  You feel very weak or you pass out (faint).  You have bloody or black poop or poop that look like tar.  You have very bad pain, cramping, or bloating in your belly (abdomen).  You have trouble breathing or you are breathing very quickly.  Your heart is beating very quickly.  Your skin feels cold and clammy.  You feel confused.  You have signs of dehydration, such as:  Dark pee, hardly any pee, or no pee.  Cracked lips.  Dry mouth.  Sunken eyes.  Sleepiness.  Weakness. This information is not intended to replace advice given to you by your health care provider. Make sure you discuss any questions you have with your health care provider. Document Released: 07/26/2007 Document Revised: 08/27/2015 Document Reviewed:  10/13/2014 Elsevier Interactive Patient Education  2017 [employer].

## 2016-06-12 LAB — GASTROINTESTINAL PANEL BY PCR, STOOL (REPLACES STOOL CULTURE)
ASTROVIRUS: NOT DETECTED
Adenovirus F40/41: NOT DETECTED
CRYPTOSPORIDIUM: NOT DETECTED
CYCLOSPORA CAYETANENSIS: NOT DETECTED
Campylobacter species: NOT DETECTED
ENTAMOEBA HISTOLYTICA: NOT DETECTED
Enteroaggregative E coli (EAEC): NOT DETECTED
Enteropathogenic E coli (EPEC): DETECTED — AB
Enterotoxigenic E coli (ETEC): NOT DETECTED
Giardia lamblia: NOT DETECTED
Norovirus GI/GII: NOT DETECTED
Plesimonas shigelloides: NOT DETECTED
Rotavirus A: NOT DETECTED
SAPOVIRUS (I, II, IV, AND V): NOT DETECTED
SHIGA LIKE TOXIN PRODUCING E COLI (STEC): NOT DETECTED
SHIGELLA/ENTEROINVASIVE E COLI (EIEC): NOT DETECTED
Salmonella species: NOT DETECTED
VIBRIO CHOLERAE: NOT DETECTED
VIBRIO SPECIES: NOT DETECTED
YERSINIA ENTEROCOLITICA: NOT DETECTED

## 2016-06-12 LAB — LACTOFERRIN, FECAL, QUALITATIVE: Lactoferrin, Fecal, Qual: NEGATIVE

## 2016-06-13 ENCOUNTER — Inpatient Hospital Stay: Payer: Medicare HMO | Admitting: Family Medicine

## 2016-06-13 DIAGNOSIS — R9389 Abnormal findings on diagnostic imaging of other specified body structures: Secondary | ICD-10-CM | POA: Insufficient documentation

## 2016-06-13 HISTORY — DX: Abnormal findings on diagnostic imaging of other specified body structures: R93.89

## 2016-06-13 LAB — CULTURE, BLOOD (ROUTINE X 2)
CULTURE: NO GROWTH
Culture: NO GROWTH
Special Requests: ADEQUATE

## 2016-06-13 NOTE — Progress Notes (Deleted)
   Subjective:   Alexis Cole is a 71 y.o. female with a history of *** here for Hospital follow-up for dehydration, hyponatremia, generalized weakness  Patient was hospitalized 06/08/16 through 06/11/16 for altered mental status and hyponatremia. She presented to the emergency department for dizziness, generalized weakness, decreased oral intake, and intermittent nonbloody nonbilious emesis for 10 days. She was noted to be 2 And tachycardic with slowed cognition. She was treated with a sepsis fluid bolus and her community-acquired left lower lobe pneumonia was treated with ceftriaxone and azithromycin. Her electrolyte disturbances improved with rehydration.  Since discharge, patient reports ***  She finish the last day of her azithromycin for her community-acquired pneumonia. Her weakness ***.  She has started home health physical therapy and is using her rolling walker ***.  She is been holding her lisinopril since discharge due to her recovering volume depletion ***.  CT abdomen and pelvis during admission showed incidental finding of low attenuation endometrial thickening up to 10 mm. She denies any vaginal bleeding. She reports going through menopause ***years ago.  Her diarrhea ***.  Hyperparathyroidism ***  Review of Systems:  Per HPI.   Social History: *** smoker  Objective:  There were no vitals taken for this visit.  Gen:  71 y.o. female in NAD *** HEENT: NCAT, MMM, EOMI, PERRL, anicteric sclerae CV: RRR, no MRG, no JVD Resp: Non-labored, CTAB, no wheezes noted Abd: Soft, NTND, BS present, no guarding or organomegaly Ext: WWP, no edema MSK: Full ROM, strength intact Neuro: Alert and oriented, speech normal       Chemistry      Component Value Date/Time   NA 135 06/11/2016 1633   NA 143 05/26/2016 1512   K 4.0 06/11/2016 1633   CL 108 06/11/2016 1633   CO2 22 06/11/2016 1633   BUN <5 (L) 06/11/2016 1633   BUN 19 05/26/2016 1512   CREATININE 0.78 06/11/2016 1633     CREATININE 0.88 04/17/2016 1001      Component Value Date/Time   CALCIUM 10.4 (H) 06/11/2016 1633   ALKPHOS 84 06/08/2016 1311   AST 66 (H) 06/08/2016 1311   ALT 67 (H) 06/08/2016 1311   BILITOT 1.5 (H) 06/08/2016 1311   BILITOT 1.0 05/26/2016 1512      Lab Results  Component Value Date   WBC 5.9 06/11/2016   HGB 10.2 (L) 06/11/2016   HCT 30.4 (L) 06/11/2016   MCV 96.8 06/11/2016   PLT 145 (L) 06/11/2016   Lab Results  Component Value Date   TSH 0.87 04/13/2016   Lab Results  Component Value Date   HGBA1C 5.9 01/01/2015   Assessment & Plan:     Alexis Cole is a 71 y.o. female here for ***  No problem-specific Assessment & Plan notes found for this encounter.     [name], MD MPH PGY-3,  [facility] 06/13/2016  1:33 PM

## 2016-06-19 ENCOUNTER — Encounter: Payer: Self-pay | Admitting: Family Medicine

## 2016-06-19 ENCOUNTER — Ambulatory Visit (INDEPENDENT_AMBULATORY_CARE_PROVIDER_SITE_OTHER): Payer: Medicare HMO | Admitting: Family Medicine

## 2016-06-19 VITALS — BP 122/68 | HR 112 | Temp 98.0°F | Ht 66.0 in | Wt 195.0 lb

## 2016-06-19 DIAGNOSIS — F341 Dysthymic disorder: Secondary | ICD-10-CM

## 2016-06-19 DIAGNOSIS — I10 Essential (primary) hypertension: Secondary | ICD-10-CM

## 2016-06-19 DIAGNOSIS — E213 Hyperparathyroidism, unspecified: Secondary | ICD-10-CM | POA: Diagnosis not present

## 2016-06-19 DIAGNOSIS — J181 Lobar pneumonia, unspecified organism: Secondary | ICD-10-CM | POA: Diagnosis not present

## 2016-06-19 DIAGNOSIS — R531 Weakness: Secondary | ICD-10-CM | POA: Diagnosis not present

## 2016-06-19 DIAGNOSIS — G4700 Insomnia, unspecified: Secondary | ICD-10-CM

## 2016-06-19 DIAGNOSIS — R938 Abnormal findings on diagnostic imaging of other specified body structures: Secondary | ICD-10-CM | POA: Diagnosis not present

## 2016-06-19 DIAGNOSIS — J189 Pneumonia, unspecified organism: Secondary | ICD-10-CM

## 2016-06-19 DIAGNOSIS — R9389 Abnormal findings on diagnostic imaging of other specified body structures: Secondary | ICD-10-CM

## 2016-06-19 MED ORDER — GABAPENTIN 300 MG PO CAPS: 300.0000 mg | ORAL_CAPSULE | Freq: Three times a day (TID) | ORAL | 3 refills | Status: DC

## 2016-06-19 MED ORDER — ESCITALOPRAM OXALATE 5 MG PO TABS: 5.0000 mg | ORAL_TABLET | Freq: Every day | ORAL | 1 refills | Status: DC

## 2016-06-19 NOTE — Patient Instructions (Signed)
Nice to see you again today. I'm glad that you are starting to improve after recent hospitalization. We are getting some labs today and someone will call you or send you a letter with the results when they're available.  Do not take your lisinopril as her blood pressure is normal today. Continue you are BuSpar. Start Lexapro 5 mg daily for anxiety and depression. Resume gabapentin 300 mg daily at bedtime for insomnia and pain. Resume atorvastatin for your cholesterol.  Someone should contact you about home health physical therapy. I'm giving you a prescription for rolling walker.  We need to get you to see the surgeon at some point for your hyperparathyroidism. We also need to get an ultrasound of your uterus at some point for the thickened lining that was found incidentally during her hospitalization.  Looks follow-up in one month on your blood pressure and your anxiety and depression. I'm here if something comes up in the meantime. Let me know when you feel like you're ready to go back to work.  Take care, Dr. Jacinto Reap

## 2016-06-19 NOTE — Addendum Note (Signed)
Addended by: [name] on: 06/19/2016 02:53 PM   Modules accepted: Orders

## 2016-06-19 NOTE — Assessment & Plan Note (Signed)
Seems stable to mildly improved Deconditioning is likely playing a large role at this point after her acute illness has resolved Home health physical therapy ordered Prescription for rolling walker given

## 2016-06-19 NOTE — Assessment & Plan Note (Signed)
Discussed incidental findings with the patient who is postmenopausal No vaginal bleeding We will plan for pelvic ultrasound when patient is able to afford this

## 2016-06-19 NOTE — Assessment & Plan Note (Signed)
Denies SI or HI Seems to be having quite a negative impact on her health Hopefully the improvement in her living situation will help improve her anxiety and depression As above, hyperparathyroidism may be contributing Continue BuSpar 20 mg twice a day - she is really maxed out the dose on this She should be on an SSRI therapy as well Celexa previously caused GI upset and possible dizziness We will try low-dose Lexapro Follow-up in one month and screen with pHQ9 and GAD 7

## 2016-06-19 NOTE — Assessment & Plan Note (Addendum)
Well-controlled off of all medications Continue to hold lisinopril at this time Follow-up in one month Recheck BMP today for she previously had hyponatremia, hypokalemia, hypomagnesemia

## 2016-06-19 NOTE — Assessment & Plan Note (Signed)
Resume gabapentin 300 mg daily at bedtime as this was previously helping Her insomnia is likely multifactorial related to her anxiety, depression, poor living conditions We will also treat her anxiety as below We can consider sleep study in several months if this is not improved after anxiety and depression are well managed

## 2016-06-19 NOTE — Progress Notes (Signed)
Subjective:   Alexis Cole is a 71 y.o. female with a history of Hyperparathyroidism, HTN, obesity, tobacco use, osteoarthritis multiple joints, anxiety, depression, insomnia here for Hospital follow-up for dehydration, hyponatremia, generalized weakness  Patient was hospitalized 06/08/16 through 06/11/16 for altered mental status and hyponatremia. She presented to the emergency department for dizziness, generalized weakness, decreased oral intake, and intermittent nonbloody nonbilious emesis for 10 days. She was noted to be tachypneic and tachycardic with slowed cognition. She was treated with a sepsis fluid bolus and her community-acquired left lower lobe pneumonia was treated with ceftriaxone and azithromycin. Her electrolyte disturbances improved with rehydration.  Since discharge, patient reports feeling better than beforehospitalization  She finished the last day of her azithromycin for her community-acquired pneumonia. Her weakness continues as she is laying in bed frequently to "take it easy".  She Reports that she has not started home health physical therapy and did not get a rolling walker. She did not know that this is supposed to happen after her last hospitalization. She would like to get some therapy as it is currently difficult for her to get around. Her weakness is slightly improving as she is trying to take walks daily with a friend who walks her dog. She is using a cane to get around at this time.  She is been holding her lisinopril since discharge due to her recovering volume depletion.  She is also holding her atorvastatin and gabapentin. She is not sure why she is holding knees.  CT abdomen and pelvis during admission showed incidental finding of low attenuation endometrial thickening up to 10 mm. She denies any vaginal bleeding. She reports going through menopause ~15 years ago. Had D&C in her 15s for heavy bleeding.  Her diarrhea has resolved.  Hyperparathyroidism:  Continues to state that she will not go to see surgery until back to work to be able to afford it.  Anxiety and depression, insomnia: She states that she continues to have heightened anxiety. She is taking BuSpar 20 mg twice a day. Her neighbor that was selling cocaine has moved out, so that is helping her stress level somewhat. She previously was doing well on Celexa, but attributed some dizziness to it. She would like to try a different antidepressant. She would also like to resume her gabapentin for help with sleep. As she has been out of work since hospitalization, she is sleeping more during the day and is trying to shift her schedule back to sleeping at night.  Review of Systems:  Per HPI.   Social History: current smoker  Objective:  BP 122/68   Pulse (!) 112   Temp 98 F (36.7 C) (Oral)   Ht 5\' 6"  (1.676 m)   Wt 195 lb (88.5 kg)   SpO2 95%   BMI 31.47 kg/m   Gen:  71 y.o. female in NAD HEENT: NCAT, MMM, EOMI, PERRL, anicteric sclerae CV: RRR, no MRG Resp: Non-labored, CTAB, no wheezes noted Abd: Soft, NTND, BS present, no guarding or organomegaly Ext: WWP, no edema MSK: No obvious deformities, moves all extremities equally, slow gait, using single-point cane Neuro: Alert and oriented, speech normal       Chemistry      Component Value Date/Time   NA 135 06/11/2016 1633   NA 143 05/26/2016 1512   K 4.0 06/11/2016 1633   CL 108 06/11/2016 1633   CO2 22 06/11/2016 1633   BUN <5 (L) 06/11/2016 1633   BUN 19 05/26/2016 1512  CREATININE 0.78 06/11/2016 1633   CREATININE 0.88 04/17/2016 1001      Component Value Date/Time   CALCIUM 10.4 (H) 06/11/2016 1633   ALKPHOS 84 06/08/2016 1311   AST 66 (H) 06/08/2016 1311   ALT 67 (H) 06/08/2016 1311   BILITOT 1.5 (H) 06/08/2016 1311   BILITOT 1.0 05/26/2016 1512      Lab Results  Component Value Date   WBC 5.9 06/11/2016   HGB 10.2 (L) 06/11/2016   HCT 30.4 (L) 06/11/2016   MCV 96.8 06/11/2016   PLT 145 (L)  06/11/2016   Lab Results  Component Value Date   TSH 0.87 04/13/2016   Lab Results  Component Value Date   HGBA1C 5.9 01/01/2015   Assessment & Plan:     Alexis Cole is a 71 y.o. female here for   Essential hypertension, benign Well-controlled off of all medications Continue to hold lisinopril at this time Follow-up in one month Recheck BMP today for she previously had hyponatremia, hypokalemia, hypomagnesemia  Hyperparathyroidism ([place]) Discussed importance of seeing surgery to discuss possible parathyroid gland removal to help with her primary hyperparathyroidism and hypercalcemia Some component of her anxiety may be attributable to her hyperparathyroidism  Insomnia Resume gabapentin 300 mg daily at bedtime as this was previously helping Her insomnia is likely multifactorial related to her anxiety, depression, poor living conditions We will also treat her anxiety as below We can consider sleep study in several months if this is not improved after anxiety and depression are well managed  Increased endometrial stripe thickness Discussed incidental findings with the patient who is postmenopausal No vaginal bleeding We will plan for pelvic ultrasound when patient is able to afford this  General weakness Seems stable to mildly improved Deconditioning is likely playing a large role at this point after her acute illness has resolved Home health physical therapy ordered Prescription for rolling walker given  ANXIETY DEPRESSION Denies SI or HI Seems to be having quite a negative impact on her health Hopefully the improvement in her living situation will help improve her anxiety and depression As above, hyperparathyroidism may be contributing Continue BuSpar 20 mg twice a day - she is really maxed out the dose on this She should be on an SSRI therapy as well Celexa previously caused GI upset and possible dizziness We will try low-dose Lexapro Follow-up in one month and  screen with pHQ9 and GAD 7  Of note, diarrhea resolved. GI pathogen panel + for EPEC. No indication for treatment at this time.  [name], MD MPH PGY-3,  [facility] 06/19/2016  2:44 PM

## 2016-06-19 NOTE — Assessment & Plan Note (Signed)
Discussed importance of seeing surgery to discuss possible parathyroid gland removal to help with her primary hyperparathyroidism and hypercalcemia Some component of her anxiety may be attributable to her hyperparathyroidism

## 2016-06-20 ENCOUNTER — Encounter: Payer: Self-pay | Admitting: Family Medicine

## 2016-06-20 LAB — BASIC METABOLIC PANEL
BUN/Creatinine Ratio: 10 — ABNORMAL LOW (ref 12–28)
BUN: 7 mg/dL — AB (ref 8–27)
CALCIUM: 11.3 mg/dL — AB (ref 8.7–10.3)
CHLORIDE: 95 mmol/L — AB (ref 96–106)
CO2: 18 mmol/L (ref 18–29)
Creatinine, Ser: 0.73 mg/dL (ref 0.57–1.00)
GFR calc Af Amer: 97 mL/min/{1.73_m2} (ref >59)
GFR calc non Af Amer: 84 mL/min/{1.73_m2} (ref >59)
Glucose: 143 mg/dL — ABNORMAL HIGH (ref 65–99)
Potassium: 3.7 mmol/L (ref 3.5–5.2)
Sodium: 134 mmol/L (ref 134–144)

## 2016-06-21 DIAGNOSIS — E213 Hyperparathyroidism, unspecified: Secondary | ICD-10-CM | POA: Diagnosis not present

## 2016-06-21 DIAGNOSIS — M150 Primary generalized (osteo)arthritis: Secondary | ICD-10-CM | POA: Diagnosis not present

## 2016-06-21 DIAGNOSIS — M6281 Muscle weakness (generalized): Secondary | ICD-10-CM | POA: Diagnosis not present

## 2016-06-21 DIAGNOSIS — I10 Essential (primary) hypertension: Secondary | ICD-10-CM | POA: Diagnosis not present

## 2016-06-21 DIAGNOSIS — F329 Major depressive disorder, single episode, unspecified: Secondary | ICD-10-CM | POA: Diagnosis not present

## 2016-06-21 DIAGNOSIS — F419 Anxiety disorder, unspecified: Secondary | ICD-10-CM | POA: Diagnosis not present

## 2016-06-21 DIAGNOSIS — Z8701 Personal history of pneumonia (recurrent): Secondary | ICD-10-CM | POA: Diagnosis not present

## 2016-06-21 DIAGNOSIS — F17200 Nicotine dependence, unspecified, uncomplicated: Secondary | ICD-10-CM | POA: Diagnosis not present

## 2016-06-22 ENCOUNTER — Telehealth: Payer: Self-pay | Admitting: *Deleted

## 2016-06-22 NOTE — Telephone Encounter (Signed)
Physical therapist with [facility] calling requesting verbal orders for home health PT, 2x week for 4 weeks for strengthening, home safety, gait balance training, and safe transfer.

## 2016-06-22 NOTE — Telephone Encounter (Signed)
Attempted to call back to this number and got a man that stated I had the wrong number.  Is there a different number for me to call back to HHPT?  [name], MD, MPH PGY-3,  [facility] 06/22/2016 2:52 PM

## 2016-06-23 DIAGNOSIS — M150 Primary generalized (osteo)arthritis: Secondary | ICD-10-CM | POA: Diagnosis not present

## 2016-06-23 DIAGNOSIS — Z8701 Personal history of pneumonia (recurrent): Secondary | ICD-10-CM | POA: Diagnosis not present

## 2016-06-23 DIAGNOSIS — I10 Essential (primary) hypertension: Secondary | ICD-10-CM | POA: Diagnosis not present

## 2016-06-23 DIAGNOSIS — M6281 Muscle weakness (generalized): Secondary | ICD-10-CM | POA: Diagnosis not present

## 2016-06-23 DIAGNOSIS — E213 Hyperparathyroidism, unspecified: Secondary | ICD-10-CM | POA: Diagnosis not present

## 2016-06-23 DIAGNOSIS — F419 Anxiety disorder, unspecified: Secondary | ICD-10-CM | POA: Diagnosis not present

## 2016-06-23 DIAGNOSIS — F329 Major depressive disorder, single episode, unspecified: Secondary | ICD-10-CM | POA: Diagnosis not present

## 2016-06-23 DIAGNOSIS — F17200 Nicotine dependence, unspecified, uncomplicated: Secondary | ICD-10-CM | POA: Diagnosis not present

## 2016-06-26 ENCOUNTER — Telehealth: Payer: Self-pay | Admitting: *Deleted

## 2016-06-26 DIAGNOSIS — F17200 Nicotine dependence, unspecified, uncomplicated: Secondary | ICD-10-CM | POA: Diagnosis not present

## 2016-06-26 DIAGNOSIS — Z8701 Personal history of pneumonia (recurrent): Secondary | ICD-10-CM | POA: Diagnosis not present

## 2016-06-26 DIAGNOSIS — E213 Hyperparathyroidism, unspecified: Secondary | ICD-10-CM | POA: Diagnosis not present

## 2016-06-26 DIAGNOSIS — M6281 Muscle weakness (generalized): Secondary | ICD-10-CM | POA: Diagnosis not present

## 2016-06-26 DIAGNOSIS — I10 Essential (primary) hypertension: Secondary | ICD-10-CM | POA: Diagnosis not present

## 2016-06-26 DIAGNOSIS — F419 Anxiety disorder, unspecified: Secondary | ICD-10-CM | POA: Diagnosis not present

## 2016-06-26 DIAGNOSIS — M150 Primary generalized (osteo)arthritis: Secondary | ICD-10-CM | POA: Diagnosis not present

## 2016-06-26 DIAGNOSIS — F329 Major depressive disorder, single episode, unspecified: Secondary | ICD-10-CM | POA: Diagnosis not present

## 2016-06-26 NOTE — Telephone Encounter (Signed)
Please have patient make f/u appt in ~2 wks.  [name], MD, MPH PGY-3,  [facility] 06/26/2016 1:34 PM

## 2016-06-26 NOTE — Telephone Encounter (Signed)
Alexis Cole, Physical Therapist with [facility] called to report vital signs. BP: 162/100, heart rate 102.  Patient was told not to take blood pressure medications.  Derl Barrow, RN

## 2016-06-27 NOTE — Telephone Encounter (Signed)
Tried calling patient x 2, no answer no voicemail. Called physical therapist left message on voicemail to return call.

## 2016-06-28 DIAGNOSIS — F329 Major depressive disorder, single episode, unspecified: Secondary | ICD-10-CM | POA: Diagnosis not present

## 2016-06-28 DIAGNOSIS — F419 Anxiety disorder, unspecified: Secondary | ICD-10-CM | POA: Diagnosis not present

## 2016-06-28 DIAGNOSIS — M6281 Muscle weakness (generalized): Secondary | ICD-10-CM | POA: Diagnosis not present

## 2016-06-28 DIAGNOSIS — E213 Hyperparathyroidism, unspecified: Secondary | ICD-10-CM | POA: Diagnosis not present

## 2016-06-28 DIAGNOSIS — Z8701 Personal history of pneumonia (recurrent): Secondary | ICD-10-CM | POA: Diagnosis not present

## 2016-06-28 DIAGNOSIS — I10 Essential (primary) hypertension: Secondary | ICD-10-CM | POA: Diagnosis not present

## 2016-06-28 DIAGNOSIS — F17200 Nicotine dependence, unspecified, uncomplicated: Secondary | ICD-10-CM | POA: Diagnosis not present

## 2016-06-28 DIAGNOSIS — M150 Primary generalized (osteo)arthritis: Secondary | ICD-10-CM | POA: Diagnosis not present

## 2016-06-30 DIAGNOSIS — F419 Anxiety disorder, unspecified: Secondary | ICD-10-CM | POA: Diagnosis not present

## 2016-06-30 DIAGNOSIS — F329 Major depressive disorder, single episode, unspecified: Secondary | ICD-10-CM | POA: Diagnosis not present

## 2016-06-30 DIAGNOSIS — I10 Essential (primary) hypertension: Secondary | ICD-10-CM | POA: Diagnosis not present

## 2016-06-30 DIAGNOSIS — F17200 Nicotine dependence, unspecified, uncomplicated: Secondary | ICD-10-CM | POA: Diagnosis not present

## 2016-06-30 DIAGNOSIS — Z8701 Personal history of pneumonia (recurrent): Secondary | ICD-10-CM | POA: Diagnosis not present

## 2016-06-30 DIAGNOSIS — E213 Hyperparathyroidism, unspecified: Secondary | ICD-10-CM | POA: Diagnosis not present

## 2016-06-30 DIAGNOSIS — M150 Primary generalized (osteo)arthritis: Secondary | ICD-10-CM | POA: Diagnosis not present

## 2016-06-30 DIAGNOSIS — M6281 Muscle weakness (generalized): Secondary | ICD-10-CM | POA: Diagnosis not present

## 2016-07-04 DIAGNOSIS — E213 Hyperparathyroidism, unspecified: Secondary | ICD-10-CM | POA: Diagnosis not present

## 2016-07-04 DIAGNOSIS — I10 Essential (primary) hypertension: Secondary | ICD-10-CM | POA: Diagnosis not present

## 2016-07-04 DIAGNOSIS — F17200 Nicotine dependence, unspecified, uncomplicated: Secondary | ICD-10-CM | POA: Diagnosis not present

## 2016-07-04 DIAGNOSIS — F329 Major depressive disorder, single episode, unspecified: Secondary | ICD-10-CM | POA: Diagnosis not present

## 2016-07-04 DIAGNOSIS — M6281 Muscle weakness (generalized): Secondary | ICD-10-CM | POA: Diagnosis not present

## 2016-07-04 DIAGNOSIS — M150 Primary generalized (osteo)arthritis: Secondary | ICD-10-CM | POA: Diagnosis not present

## 2016-07-04 DIAGNOSIS — F419 Anxiety disorder, unspecified: Secondary | ICD-10-CM | POA: Diagnosis not present

## 2016-07-04 DIAGNOSIS — Z8701 Personal history of pneumonia (recurrent): Secondary | ICD-10-CM | POA: Diagnosis not present

## 2016-07-07 DIAGNOSIS — M6281 Muscle weakness (generalized): Secondary | ICD-10-CM | POA: Diagnosis not present

## 2016-07-07 DIAGNOSIS — I10 Essential (primary) hypertension: Secondary | ICD-10-CM | POA: Diagnosis not present

## 2016-07-07 DIAGNOSIS — Z8701 Personal history of pneumonia (recurrent): Secondary | ICD-10-CM | POA: Diagnosis not present

## 2016-07-07 DIAGNOSIS — F419 Anxiety disorder, unspecified: Secondary | ICD-10-CM | POA: Diagnosis not present

## 2016-07-07 DIAGNOSIS — F17200 Nicotine dependence, unspecified, uncomplicated: Secondary | ICD-10-CM | POA: Diagnosis not present

## 2016-07-07 DIAGNOSIS — E213 Hyperparathyroidism, unspecified: Secondary | ICD-10-CM | POA: Diagnosis not present

## 2016-07-07 DIAGNOSIS — F329 Major depressive disorder, single episode, unspecified: Secondary | ICD-10-CM | POA: Diagnosis not present

## 2016-07-07 DIAGNOSIS — M150 Primary generalized (osteo)arthritis: Secondary | ICD-10-CM | POA: Diagnosis not present

## 2016-07-10 DIAGNOSIS — F17200 Nicotine dependence, unspecified, uncomplicated: Secondary | ICD-10-CM | POA: Diagnosis not present

## 2016-07-10 DIAGNOSIS — F419 Anxiety disorder, unspecified: Secondary | ICD-10-CM | POA: Diagnosis not present

## 2016-07-10 DIAGNOSIS — I10 Essential (primary) hypertension: Secondary | ICD-10-CM | POA: Diagnosis not present

## 2016-07-10 DIAGNOSIS — F329 Major depressive disorder, single episode, unspecified: Secondary | ICD-10-CM | POA: Diagnosis not present

## 2016-07-10 DIAGNOSIS — M6281 Muscle weakness (generalized): Secondary | ICD-10-CM | POA: Diagnosis not present

## 2016-07-10 DIAGNOSIS — E213 Hyperparathyroidism, unspecified: Secondary | ICD-10-CM | POA: Diagnosis not present

## 2016-07-10 DIAGNOSIS — M150 Primary generalized (osteo)arthritis: Secondary | ICD-10-CM | POA: Diagnosis not present

## 2016-07-10 DIAGNOSIS — Z8701 Personal history of pneumonia (recurrent): Secondary | ICD-10-CM | POA: Diagnosis not present

## 2016-07-11 ENCOUNTER — Telehealth: Payer: Self-pay | Admitting: Family Medicine

## 2016-07-11 NOTE — Telephone Encounter (Signed)
Will forward to red team.  Jazmin Hartsell,CMA  

## 2016-07-11 NOTE — Telephone Encounter (Signed)
Pt states don't forget to put Dr. Berenice Primas and Dr. Jacelyn Grip on the approved list for [facility]. Dr. Berenice Primas for both knee steriods injections and Dr. Jacelyn Grip for general back, lower back steroid injections. ep

## 2016-07-11 NOTE — Telephone Encounter (Signed)
Upon review of chart it looks like patient already has referrals in place for these doctors.

## 2016-07-13 DIAGNOSIS — M150 Primary generalized (osteo)arthritis: Secondary | ICD-10-CM | POA: Diagnosis not present

## 2016-07-13 DIAGNOSIS — F419 Anxiety disorder, unspecified: Secondary | ICD-10-CM | POA: Diagnosis not present

## 2016-07-13 DIAGNOSIS — E213 Hyperparathyroidism, unspecified: Secondary | ICD-10-CM | POA: Diagnosis not present

## 2016-07-13 DIAGNOSIS — F17200 Nicotine dependence, unspecified, uncomplicated: Secondary | ICD-10-CM | POA: Diagnosis not present

## 2016-07-13 DIAGNOSIS — I10 Essential (primary) hypertension: Secondary | ICD-10-CM | POA: Diagnosis not present

## 2016-07-13 DIAGNOSIS — M6281 Muscle weakness (generalized): Secondary | ICD-10-CM | POA: Diagnosis not present

## 2016-07-13 DIAGNOSIS — F329 Major depressive disorder, single episode, unspecified: Secondary | ICD-10-CM | POA: Diagnosis not present

## 2016-07-13 DIAGNOSIS — Z8701 Personal history of pneumonia (recurrent): Secondary | ICD-10-CM | POA: Diagnosis not present

## 2016-07-21 ENCOUNTER — Other Ambulatory Visit: Payer: Self-pay | Admitting: *Deleted

## 2016-07-21 DIAGNOSIS — M1711 Unilateral primary osteoarthritis, right knee: Secondary | ICD-10-CM | POA: Diagnosis not present

## 2016-07-21 DIAGNOSIS — M1712 Unilateral primary osteoarthritis, left knee: Secondary | ICD-10-CM | POA: Diagnosis not present

## 2016-07-21 MED ORDER — CITALOPRAM HYDROBROMIDE 10 MG PO TABS: 10.0000 mg | ORAL_TABLET | Freq: Every day | ORAL | 0 refills | Status: DC

## 2016-07-21 MED ORDER — VITAMIN D (ERGOCALCIFEROL) 1.25 MG (50000 UNIT) PO CAPS: 50000.0000 [IU] | ORAL_CAPSULE | ORAL | 1 refills | Status: DC

## 2016-07-21 NOTE — Telephone Encounter (Signed)
Refill request for 90 day supply.  Myeasha Ballowe L, RN  

## 2016-07-26 ENCOUNTER — Telehealth: Payer: Self-pay | Admitting: Family Medicine

## 2016-07-26 DIAGNOSIS — F341 Dysthymic disorder: Secondary | ICD-10-CM

## 2016-07-26 NOTE — Telephone Encounter (Signed)
Pt doesn't understand why she was given a new RX for Celexa. She wants to stay on her lexapro (generic) because that seems to be working.  Please advise

## 2016-07-27 MED ORDER — ESCITALOPRAM OXALATE 5 MG PO TABS: 5.0000 mg | ORAL_TABLET | Freq: Every day | ORAL | 1 refills | Status: DC

## 2016-07-27 NOTE — Telephone Encounter (Signed)
Left message for patient to call back  

## 2016-07-27 NOTE — Telephone Encounter (Signed)
A refill request was received for Celexa.  I have discontinued this.  She should stay on Lexapro and not take it with Celexa.  Please inform patient of this.  [name], MD, MPH PGY-3,  [facility] 07/27/2016 8:37 AM

## 2016-07-28 NOTE — Telephone Encounter (Signed)
Left another message for patient to call back 

## 2016-08-12 ENCOUNTER — Other Ambulatory Visit: Payer: Self-pay | Admitting: Family Medicine

## 2016-08-12 DIAGNOSIS — F341 Dysthymic disorder: Secondary | ICD-10-CM

## 2016-08-21 ENCOUNTER — Other Ambulatory Visit: Payer: Medicare HMO

## 2016-08-22 ENCOUNTER — Other Ambulatory Visit: Payer: Self-pay | Admitting: Family Medicine

## 2016-08-22 DIAGNOSIS — G4700 Insomnia, unspecified: Secondary | ICD-10-CM

## 2016-08-23 ENCOUNTER — Other Ambulatory Visit: Payer: Self-pay | Admitting: Family Medicine

## 2016-08-25 ENCOUNTER — Other Ambulatory Visit: Payer: Self-pay | Admitting: Internal Medicine

## 2016-08-25 NOTE — Telephone Encounter (Signed)
Needs refill on diclofenac.

## 2016-08-26 MED ORDER — DICLOFENAC SODIUM 75 MG PO TBEC: DELAYED_RELEASE_TABLET | ORAL | 2 refills | Status: DC

## 2016-09-01 DIAGNOSIS — M47816 Spondylosis without myelopathy or radiculopathy, lumbar region: Secondary | ICD-10-CM | POA: Diagnosis not present

## 2016-09-07 DIAGNOSIS — M47816 Spondylosis without myelopathy or radiculopathy, lumbar region: Secondary | ICD-10-CM | POA: Diagnosis not present

## 2016-10-03 ENCOUNTER — Ambulatory Visit (INDEPENDENT_AMBULATORY_CARE_PROVIDER_SITE_OTHER): Payer: Medicare Other | Admitting: Internal Medicine

## 2016-10-03 ENCOUNTER — Encounter: Payer: Self-pay | Admitting: Internal Medicine

## 2016-10-03 VITALS — BP 142/84 | HR 79 | Temp 98.5°F | Ht 66.0 in | Wt 191.4 lb

## 2016-10-03 DIAGNOSIS — R7303 Prediabetes: Secondary | ICD-10-CM

## 2016-10-03 DIAGNOSIS — Z0000 Encounter for general adult medical examination without abnormal findings: Secondary | ICD-10-CM | POA: Diagnosis not present

## 2016-10-03 DIAGNOSIS — I10 Essential (primary) hypertension: Secondary | ICD-10-CM | POA: Diagnosis not present

## 2016-10-03 DIAGNOSIS — E213 Hyperparathyroidism, unspecified: Secondary | ICD-10-CM

## 2016-10-03 LAB — POCT GLYCOSYLATED HEMOGLOBIN (HGB A1C): HEMOGLOBIN A1C: 6.1

## 2016-10-03 MED ORDER — LORATADINE-PSEUDOEPHEDRINE ER 10-240 MG PO TB24: 1.0000 | ORAL_TABLET | Freq: Every day | ORAL | 3 refills | Status: DC

## 2016-10-03 MED ORDER — LISINOPRIL 20 MG PO TABS: 20.0000 mg | ORAL_TABLET | Freq: Every day | ORAL | 3 refills | Status: DC

## 2016-10-03 MED ORDER — HYDROCHLOROTHIAZIDE 25 MG PO TABS: 25.0000 mg | ORAL_TABLET | Freq: Every day | ORAL | Status: DC

## 2016-10-03 MED ORDER — FLUTICASONE PROPIONATE 50 MCG/ACT NA SUSP: 2.0000 | Freq: Every day | NASAL | 6 refills | Status: DC

## 2016-10-03 NOTE — Assessment & Plan Note (Signed)
Discussed with patient getting colonoscopy and mammogram She states that she will consider it Provided patient with information regarding these Also discussed advanced directive with patient. She states that she has the paperwork and just has not filled it out. Will provide this information at next visit

## 2016-10-03 NOTE — Assessment & Plan Note (Addendum)
Per review of patient's lab work, including hypercalcemia, vitamin D level, 24-hour urine calcium, and PTH -Patient does not require surgical removal of parathyroid -Likely patient with primary hyperparathyroidism versus familial hypocalciuric hypercalcemia - Will monitor calcium and PTH levels in the future - Discuss next steps with patient in future  - CMET obtained  - Will need a PTH as well

## 2016-10-03 NOTE — Progress Notes (Signed)
Alexis Cole is a 71 y.o. female presents to office today for annual physical exam examination.  Concerns today include:  1. No concerns   Last dental exam: None, covered will be soon  Last colonoscopy: want to make sure that it is covered  Last mammogram: not sure she wants to get it if she has to pay for things  Immunizations needed: Not needed  Refills needed today: none   Women's Health  Menopause about 71 years old  Pelvic symptoms: none  Exercise: getting ready to join a gym, has a dog that she walks 3 times daily  Diet: Breakfast: fruits and vega tables, meat, fish 3 times a day  Smoking: half- 1ppd about 19 years  Alcohol: None  Drugs: None  Advance directives: has it, but has not filled it out  Mood:improved significantly since being placed on escitalopram   Past Medical History:  Diagnosis Date  . Acne    but not being treated for  . Anemia    history of--many yrs ago  . Anxiety    but doesn't take any meds for this  . Arthritis   . Chronic back pain   . Chronic back pain   . Depression   . Gastric ulcer    history of--many yrs ago  . Headache(784.0)   . History of bronchitis    last time 62months  . Hx of migraines    last one a month ago  . Insomnia    Nyquil nightly  . Joint pain   . Joint swelling   . Sinusitis    Social History   Social History  . Marital status: Divorced    Spouse name: N/A  . Number of children: N/A  . Years of education: N/A   Occupational History  . Not on file.   Social History Main Topics  . Smoking status: Current Every Day Smoker    Packs/day: 0.50    Years: 15.00    Types: Cigarettes    Start date: 02/21/1968  . Smokeless tobacco: Never Used     Comment: quit in the past for 9 years.  Previous 1ppd  . Alcohol use 0.0 oz/week     Comment: wine with red meat;but stopped a month ago  . Drug use: No  . Sexual activity: Yes    Birth control/ protection: Post-menopausal   Other Topics Concern  . Not on file    Social History Narrative   ** Merged History Encounter **       Past Surgical History:  Procedure Laterality Date  . CERVICAL CONE BIOPSY    . DILATION AND CURETTAGE OF UTERUS  30+yrs ago  . MOUTH SURGERY    . NASAL SEPTUM SURGERY  40+yrs ago  . TOTAL HIP ARTHROPLASTY  09/01/2011   Procedure: TOTAL HIP ARTHROPLASTY;  Surgeon: [name], MD;  Location: [facility];  Service: Orthopedics;  Laterality: Right;  . TUBAL LIGATION  30+yrs ago   Family History  Problem Relation Age of Onset  . Hypertension Mother   . Hyperlipidemia Neg Hx   . Heart attack Neg Hx   . Diabetes Neg Hx   . Sudden death Neg Hx     ROS: Review of Systems Review of Systems  Constitutional: Negative for chills and fever.  HENT: Negative for hearing loss and tinnitus.   Eyes: Negative for blurred vision and double vision.  Respiratory: Negative for cough and wheezing.   Cardiovascular: Negative for chest pain and palpitations.  Gastrointestinal: Negative for nausea and vomiting.  Genitourinary: Negative for dysuria and urgency.  Skin: Negative for rash.  Neurological: Negative for dizziness and headaches.  Psychiatric/Behavioral: Negative for depression and suicidal ideas.   Vitals:   10/03/16 1102  BP: (!) 142/84  Pulse: 79  Temp: 98.5 F (36.9 C)  SpO2: 97%     Physical exam Physical Exam  Constitutional: She is oriented to person, place, and time. She appears well-developed and well-nourished.  HENT:  Head: Normocephalic and atraumatic.  Eyes: Pupils are equal, round, and reactive to light. Conjunctivae and EOM are normal.  Neck: Normal range of motion. Neck supple.  Cardiovascular: Normal rate and regular rhythm.   Pulmonary/Chest: Effort normal and breath sounds normal.  Abdominal: Soft. Bowel sounds are normal.  Musculoskeletal: Normal range of motion.  Neurological: She is alert and oriented to person, place, and time.  Skin: Skin is warm. Capillary refill takes less than 2 seconds.       Assessment/ Plan: Patient here for annual physical exam.   Essential hypertension, benign Blood pressure elevated, only taking lisinopril. Plans to restart HCTZ.  Obtain CMET today  Discussed the importance of tobacco cessation   Healthcare maintenance Discussed with patient getting colonoscopy and mammogram She states that she will consider it Provided patient with information regarding these Also discussed advanced directive with patient. She states that she has the paperwork and just has not filled it out. Will provide this information at next visit  Hyperparathyroidism [facility]) Per review of patient's lab work, including hypercalcemia, vitamin D level, 24-hour urine calcium, and PTH -Patient does not require surgical removal of parathyroid -Likely patient with primary hyperparathyroidism versus familial hypocalciuric hypercalcemia - Will monitor calcium and PTH levels in the future - Discuss next steps with patient in future     [place] PGY-3, [place]

## 2016-10-03 NOTE — Assessment & Plan Note (Signed)
Blood pressure elevated, only taking lisinopril. Plans to restart HCTZ.  Obtain CMET today  Discussed the importance of tobacco cessation

## 2016-10-03 NOTE — Patient Instructions (Signed)
Please follow up with me in two weeks. Consider getting your mammogram and colonoscopy

## 2016-10-04 ENCOUNTER — Encounter (HOSPITAL_COMMUNITY): Payer: Self-pay | Admitting: Internal Medicine

## 2016-10-04 LAB — CBC
HEMATOCRIT: 42.7 % (ref 34.0–46.6)
Hemoglobin: 14.6 g/dL (ref 11.1–15.9)
MCH: 30.7 pg (ref 26.6–33.0)
MCHC: 34.2 g/dL (ref 31.5–35.7)
MCV: 90 fL (ref 79–97)
PLATELETS: 234 10*3/uL (ref 150–379)
RBC: 4.76 x10E6/uL (ref 3.77–5.28)
RDW: 12.8 % (ref 12.3–15.4)
WBC: 8.3 10*3/uL (ref 3.4–10.8)

## 2016-10-04 LAB — CMP14+EGFR
ALK PHOS: 116 IU/L (ref 39–117)
ALT: 13 IU/L (ref 0–32)
AST: 19 IU/L (ref 0–40)
Albumin/Globulin Ratio: 2 (ref 1.2–2.2)
Albumin: 4.7 g/dL (ref 3.5–4.8)
BUN/Creatinine Ratio: 28 (ref 12–28)
BUN: 21 mg/dL (ref 8–27)
Bilirubin Total: 0.4 mg/dL (ref 0.0–1.2)
CO2: 22 mmol/L (ref 20–29)
CREATININE: 0.74 mg/dL (ref 0.57–1.00)
Calcium: 11.1 mg/dL — ABNORMAL HIGH (ref 8.7–10.3)
Chloride: 102 mmol/L (ref 96–106)
GFR calc Af Amer: 95 mL/min/{1.73_m2} (ref >59)
GFR calc non Af Amer: 82 mL/min/{1.73_m2} (ref >59)
GLUCOSE: 98 mg/dL (ref 65–99)
Globulin, Total: 2.3 g/dL (ref 1.5–4.5)
Potassium: 5 mmol/L (ref 3.5–5.2)
Sodium: 138 mmol/L (ref 134–144)
Total Protein: 7 g/dL (ref 6.0–8.5)

## 2016-10-24 ENCOUNTER — Ambulatory Visit: Payer: Medicare Other | Admitting: Internal Medicine

## 2016-10-24 DIAGNOSIS — M1712 Unilateral primary osteoarthritis, left knee: Secondary | ICD-10-CM | POA: Diagnosis not present

## 2016-10-24 DIAGNOSIS — M1711 Unilateral primary osteoarthritis, right knee: Secondary | ICD-10-CM | POA: Diagnosis not present

## 2016-10-25 ENCOUNTER — Ambulatory Visit (INDEPENDENT_AMBULATORY_CARE_PROVIDER_SITE_OTHER): Payer: Medicare Other | Admitting: Internal Medicine

## 2016-10-25 ENCOUNTER — Encounter: Payer: Self-pay | Admitting: Internal Medicine

## 2016-10-25 VITALS — BP 120/75 | HR 80 | Temp 98.2°F | Ht 66.0 in | Wt 190.4 lb

## 2016-10-25 DIAGNOSIS — E213 Hyperparathyroidism, unspecified: Secondary | ICD-10-CM

## 2016-10-25 DIAGNOSIS — I10 Essential (primary) hypertension: Secondary | ICD-10-CM

## 2016-10-25 DIAGNOSIS — Z23 Encounter for immunization: Secondary | ICD-10-CM | POA: Diagnosis not present

## 2016-10-25 NOTE — Progress Notes (Signed)
   [facility] Kerrin Mo, MD Phone: [phone]  Reason For Visit: F/u Lab results and blood pressu   #CHRONIC HTN Since patient has restarted blood pressured medication, blood pressure is well controlled  Current Meds - continue lisinopril and HCTZ  Reports good compliance,took meds today. Tolerating well, w/o complaints. Denies CP, dyspnea, HA, edema, dizziness / lightheadedness  #Patient continues to have primary hyperparathyroidisms with elevated levels of calcium. Denies any bone pain, nausea, abdominal pain, or muscle weakness. States she feels pretty darn good   Past Medical History Reviewed problem list.  Medications- reviewed and updated No additions to family history Social history- patient is a non- smoker  Objective: BP 120/75 (BP Location: Left Arm, Patient Position: Sitting, Cuff Size: Normal)   Pulse 80   Temp 98.2 F (36.8 C) (Oral)   Ht 5\' 6"  (1.676 m)   Wt 190 lb 6.4 oz (86.4 kg)   SpO2 95%   BMI 30.73 kg/m  Gen: NAD, alert, cooperative with exam Cardio: regular rate and rhythm, S1S2 heard, no murmurs appreciated Pulm: clear to auscultation bilaterally, no wheezes, rhonchi or rales GI: soft, non-tender, non-distended, bowel sounds present, no hepatomegaly, no splenomegaly Skin: dry, intact, no rashes or lesion  Assessment/Plan: See problem based a/p  Essential hypertension, benign Well controlled  Continue HCTZ and lisinopril  Follow up in about 3 months  Hyperparathyroidism ([place]) Primary hyperparathyriodism  Recommend surgical removal of parathyroid  Discussed risks and benefits with patient  She plans to contact surgery to discuss this

## 2016-10-25 NOTE — Patient Instructions (Signed)
For your parathyroid I want you to  follow-up with surgery to have it removed. Please follow up with me in the next 6 months

## 2016-10-29 NOTE — Assessment & Plan Note (Signed)
Primary hyperparathyriodism  Recommend surgical removal of parathyroid  Discussed risks and benefits with patient  She plans to contact surgery to discuss this

## 2016-10-29 NOTE — Assessment & Plan Note (Signed)
Well controlled  Continue HCTZ and lisinopril  Follow up in about 3 months

## 2016-10-30 ENCOUNTER — Ambulatory Visit (INDEPENDENT_AMBULATORY_CARE_PROVIDER_SITE_OTHER): Payer: Medicare Other | Admitting: *Deleted

## 2016-10-30 ENCOUNTER — Telehealth: Payer: Self-pay | Admitting: Internal Medicine

## 2016-10-30 DIAGNOSIS — Z7185 Encounter for immunization safety counseling: Secondary | ICD-10-CM

## 2016-10-30 DIAGNOSIS — Z7189 Other specified counseling: Secondary | ICD-10-CM

## 2016-10-30 NOTE — Telephone Encounter (Signed)
Please let patient know that I have placed another referral

## 2016-10-30 NOTE — Progress Notes (Signed)
   Patient in nurse clinic for pneumonia vaccine. Reviewing patient's record and [facility]; patient already had Prevnar 13 09/04/11 and Pneumoconjugate 04/17/14.  Advised patient that the nurse is not able to provider her with another vaccine.  Patient stated she did not remember getting the vaccines and they were incorrect.  Advised patient because they have been documented in two different places and the nurse can not remove it from her record. Patient stated "I hope I don't get pneumonia, because I did not get those shots." Nurse apologized to patient but informed her that the vaccines could not be given. Patient also verified her address was correct in [place].  Derl Barrow, RN

## 2016-10-30 NOTE — Telephone Encounter (Signed)
Patient came to office stated [facility] told her she needs a referring doctor on the order to be seen by them. Any questions please call [phone]

## 2016-11-22 ENCOUNTER — Telehealth: Payer: Self-pay | Admitting: Internal Medicine

## 2016-11-22 NOTE — Telephone Encounter (Signed)
Pt was waiting on [place] to mail her a card for the colon screening. She said she was suppose to get this about 2-3 weeks ago. Can we send her another one. I checked and the address in Epic is correct. jw

## 2016-11-23 NOTE — Telephone Encounter (Signed)
Screening test mailed to patient address.

## 2016-11-29 DIAGNOSIS — E210 Primary hyperparathyroidism: Secondary | ICD-10-CM | POA: Diagnosis not present

## 2016-11-30 ENCOUNTER — Other Ambulatory Visit (HOSPITAL_COMMUNITY): Payer: Self-pay | Admitting: Surgery

## 2016-11-30 DIAGNOSIS — E210 Primary hyperparathyroidism: Secondary | ICD-10-CM

## 2016-12-01 DIAGNOSIS — M47816 Spondylosis without myelopathy or radiculopathy, lumbar region: Secondary | ICD-10-CM | POA: Diagnosis not present

## 2016-12-05 ENCOUNTER — Other Ambulatory Visit: Payer: Self-pay | Admitting: Internal Medicine

## 2016-12-05 MED ORDER — DICLOFENAC SODIUM 75 MG PO TBEC: DELAYED_RELEASE_TABLET | ORAL | 2 refills | Status: DC

## 2016-12-12 ENCOUNTER — Ambulatory Visit (HOSPITAL_COMMUNITY)
Admission: RE | Admit: 2016-12-12 | Discharge: 2016-12-12 | Disposition: A | Discharge status: Home / Self Care | Payer: Medicare Other | Source: Ambulatory Visit | Attending: Surgery | Admitting: Surgery

## 2016-12-12 ENCOUNTER — Encounter (HOSPITAL_COMMUNITY)
Admission: RE | Admit: 2016-12-12 | Discharge: 2016-12-12 | Disposition: A | Discharge status: Home / Self Care | Payer: Medicare Other | Source: Ambulatory Visit | Attending: Surgery | Admitting: Surgery

## 2016-12-12 DIAGNOSIS — E210 Primary hyperparathyroidism: Secondary | ICD-10-CM | POA: Insufficient documentation

## 2016-12-12 DIAGNOSIS — E213 Hyperparathyroidism, unspecified: Secondary | ICD-10-CM | POA: Diagnosis not present

## 2016-12-12 HISTORY — DX: Hypercalcemia: E83.52

## 2016-12-12 HISTORY — DX: Hyperparathyroidism, unspecified: E21.3

## 2016-12-12 MED ORDER — TECHNETIUM TC 99M SESTAMIBI GENERIC - CARDIOLITE: 25.0000 | Freq: Once | INTRAVENOUS | Status: DC | PRN

## 2016-12-14 ENCOUNTER — Other Ambulatory Visit: Payer: Self-pay | Admitting: *Deleted

## 2016-12-14 DIAGNOSIS — Z1211 Encounter for screening for malignant neoplasm of colon: Secondary | ICD-10-CM

## 2016-12-18 ENCOUNTER — Other Ambulatory Visit: Payer: Self-pay | Admitting: *Deleted

## 2016-12-18 ENCOUNTER — Ambulatory Visit: Payer: Self-pay | Admitting: Surgery

## 2016-12-18 DIAGNOSIS — F341 Dysthymic disorder: Secondary | ICD-10-CM

## 2016-12-18 NOTE — Telephone Encounter (Signed)
Patient left message on nurse line requesting refill on lexapro.

## 2016-12-19 DIAGNOSIS — M47816 Spondylosis without myelopathy or radiculopathy, lumbar region: Secondary | ICD-10-CM | POA: Diagnosis not present

## 2016-12-19 MED ORDER — ESCITALOPRAM OXALATE 5 MG PO TABS: 5.0000 mg | ORAL_TABLET | Freq: Every day | ORAL | 1 refills | Status: DC

## 2016-12-20 LAB — FECAL OCCULT BLOOD, IMMUNOCHEMICAL: FECAL OCCULT BLD: NEGATIVE

## 2016-12-26 ENCOUNTER — Other Ambulatory Visit: Payer: Self-pay | Admitting: Internal Medicine

## 2016-12-26 DIAGNOSIS — F341 Dysthymic disorder: Secondary | ICD-10-CM

## 2016-12-26 MED ORDER — ESCITALOPRAM OXALATE 5 MG PO TABS: 5.0000 mg | ORAL_TABLET | Freq: Every day | ORAL | 2 refills | Status: DC

## 2016-12-28 DIAGNOSIS — M1711 Unilateral primary osteoarthritis, right knee: Secondary | ICD-10-CM | POA: Diagnosis not present

## 2016-12-28 DIAGNOSIS — M1712 Unilateral primary osteoarthritis, left knee: Secondary | ICD-10-CM | POA: Diagnosis not present

## 2016-12-28 NOTE — Progress Notes (Signed)
06-09-16 (EPIC) ECHO  06-08-16 (EPIC) EKG, CXR

## 2016-12-28 NOTE — Patient Instructions (Addendum)
[place] [facility]  12/28/2016   Your procedure is scheduled on: 01-04-17   Report to [facility] Main  Entrance Take [place] Elevators to 3rd floor to  [place] at 10:00 AM.    Call this number if you have problems the morning of surgery [phone]    Remember: ONLY 1 PERSON MAY GO WITH YOU TO SHORT STAY TO GET  READY MORNING OF [place].  Do not eat food or drink liquids :After Midnight.     Take these medicines the morning of surgery with A SIP OF WATER: Buspirone (Buspar), Escitalopram (Lexapro), Gabapentin (Neurontin), Loratadine (Claritin). You may also bring and use your nasal spray and inhaler as needed.                                You may not have any metal on your body including hair pins and              piercings  Do not wear jewelry, make-up, lotions, powders or perfumes, deodorant             Do not wear nail polish.  Do not shave  48 hours prior to surgery.                Do not bring valuables to the hospital. [place].  Contacts, dentures or bridgework may not be worn into surgery.      Patients discharged the day of surgery will not be allowed to drive home.  Name and phone number of your driver: Burna Cash [phone]               Please read over the following fact sheets you were given: _____________________________________________________________________             [facility] - Preparing for Surgery Before surgery, you can play an important role.  Because skin is not sterile, your skin needs to be as free of germs as possible.  You can reduce the number of germs on your skin by washing with CHG (chlorahexidine gluconate) soap before surgery.  CHG is an antiseptic cleaner which kills germs and bonds with the skin to continue killing germs even after washing. Please DO NOT use if you have an allergy to CHG or antibacterial soaps.  If your skin becomes reddened/irritated  stop using the CHG and inform your nurse when you arrive at Short Stay. Do not shave (including legs and underarms) for at least 48 hours prior to the first CHG shower.  You may shave your face/neck. Please follow these instructions carefully:  1.  Shower with CHG Soap the night before surgery and the  morning of Surgery.  2.  If you choose to wash your hair, wash your hair first as usual with your  normal  shampoo.  3.  After you shampoo, rinse your hair and body thoroughly to remove the  shampoo.                           4.  Use CHG as you would any other liquid soap.  You can apply chg directly  to the skin and wash  Gently with a scrungie or clean washcloth.  5.  Apply the CHG Soap to your body ONLY FROM THE NECK DOWN.   Do not use on face/ open                           Wound or open sores. Avoid contact with eyes, ears mouth and genitals (private parts).                       Wash face,  Genitals (private parts) with your normal soap.             6.  Wash thoroughly, paying special attention to the area where your surgery  will be performed.  7.  Thoroughly rinse your body with warm water from the neck down.  8.  DO NOT shower/wash with your normal soap after using and rinsing off  the CHG Soap.                9.  Pat yourself dry with a clean towel.            10.  Wear clean pajamas.            11.  Place clean sheets on your bed the night of your first shower and do not  sleep with pets. Day of Surgery : Do not apply any lotions/deodorants the morning of surgery.  Please wear clean clothes to the hospital/surgery center.  FAILURE TO FOLLOW THESE INSTRUCTIONS MAY RESULT IN THE CANCELLATION OF YOUR SURGERY PATIENT SIGNATURE_________________________________  NURSE SIGNATURE__________________________________  ________________________________________________________________________

## 2016-12-29 ENCOUNTER — Encounter (HOSPITAL_COMMUNITY)
Admission: RE | Admit: 2016-12-29 | Discharge: 2016-12-29 | Disposition: A | Discharge status: Home / Self Care | Payer: Medicare Other | Source: Ambulatory Visit | Attending: Surgery | Admitting: Surgery

## 2016-12-29 ENCOUNTER — Other Ambulatory Visit: Payer: Self-pay

## 2016-12-29 ENCOUNTER — Encounter (HOSPITAL_COMMUNITY): Payer: Self-pay

## 2016-12-29 DIAGNOSIS — Z01818 Encounter for other preprocedural examination: Secondary | ICD-10-CM | POA: Insufficient documentation

## 2016-12-29 DIAGNOSIS — E210 Primary hyperparathyroidism: Secondary | ICD-10-CM | POA: Diagnosis not present

## 2016-12-29 LAB — BASIC METABOLIC PANEL
ANION GAP: 7 (ref 5–15)
BUN: 16 mg/dL (ref 6–20)
CALCIUM: 10.2 mg/dL (ref 8.9–10.3)
CHLORIDE: 107 mmol/L (ref 101–111)
CO2: 26 mmol/L (ref 22–32)
Creatinine, Ser: 0.88 mg/dL (ref 0.44–1.00)
GFR calc non Af Amer: >60 mL/min (ref >60)
Glucose, Bld: 101 mg/dL — ABNORMAL HIGH (ref 65–99)
Potassium: 4.7 mmol/L (ref 3.5–5.1)
Sodium: 140 mmol/L (ref 135–145)

## 2016-12-29 LAB — CBC
HCT: 41.3 % (ref 36.0–46.0)
HEMOGLOBIN: 13.7 g/dL (ref 12.0–15.0)
MCH: 30.9 pg (ref 26.0–34.0)
MCHC: 33.2 g/dL (ref 30.0–36.0)
MCV: 93.2 fL (ref 78.0–100.0)
Platelets: 206 10*3/uL (ref 150–400)
RBC: 4.43 MIL/uL (ref 3.87–5.11)
RDW: 14.3 % (ref 11.5–15.5)
WBC: 13.9 10*3/uL — ABNORMAL HIGH (ref 4.0–10.5)

## 2017-01-01 ENCOUNTER — Encounter (HOSPITAL_COMMUNITY): Payer: Self-pay | Admitting: Surgery

## 2017-01-01 NOTE — H&P (Signed)
General Surgery [facility]  Sherren Mocha DOB: Jan 26, 1946 Divorced / Language: Cleophus Molt / Race: White Female  History of Present Illness   The patient is a 71 year old female who presents with primary hyperparathyroidism.  CC: primary hyperparathyroidism  Patient is referred by her primary care physician at [facility] family practice for evaluation and management of suspected primary hyperparathyroidism. Patient had been noted in the spring of 2018 to have elevated serum calcium levels. Her highest level was recorded in February 2018 at 12.9. Subsequent levels of range from 10.5-11.3. Intact PTH level was elevated at 133. Vitamin D level was normal at 28. 24-hour urine collection for calcium was normal at 133. Patient denies any symptoms. She denies fatigue. She denies bone or joint pain. She denies any recent fractures. She denies nephrolithiasis. Patient has had no prior head or neck surgery. She has had hyperthyroidism as a teenager and was treated medically. She is not currently taking thyroid hormone. There is no family history of parathyroid disease or other endocrine neoplasm. Of note, the patient continues to take hydrochlorothiazide 25 mg daily. Patient now presents for further evaluation and recommendations.   Past Surgical History  Cataract Surgery  Bilateral. Hip Surgery  Right. Oral Surgery   Diagnostic Studies History  Colonoscopy  never Mammogram  1-3 years ago Pap Smear  1-5 years ago  Allergies  No Known Drug Allergies 11/29/2016 (Marked as Inactive) Allergies Reconciled  Adhesive Bandages *MEDICAL DEVICES AND SUPPLIES*   Medication History Atorvastatin Calcium (40MG  Tablet, Oral) Active. Azithromycin (500MG  Tablet, Oral) Active. BusPIRone HCl (10MG  Tablet, Oral) Active. Diclofenac Sodium (75MG  Tablet DR, Oral) Active. Escitalopram Oxalate (5MG  Tablet, Oral) Active. Fluticasone Propionate (50MCG/ACT Suspension,  Nasal) Active. Gabapentin (300MG  Capsule, Oral) Active. Lisinopril (20MG  Tablet, Oral) Active. Loperamide HCl (2MG  Capsule, Oral) Active. Voltaren (75MG  Tablet DR, Oral) Active. Lexapro (5MG  Tablet, Oral) Active. Omega 3 (1000MG  Capsule, Oral) Active. Lipitor (40MG  Tablet, Oral) Active. Claritin (10MG  Tablet, Oral) Active.  Social History  Alcohol use  Remotely quit alcohol use. Caffeine use  Coffee, Tea. Illicit drug use  Remotely quit drug use. Tobacco use  Current every day smoker.  Family History  Alcohol Abuse  Father. Arthritis  Mother. Depression  Mother. Heart Disease  Mother. Hypertension  Mother. Ischemic Bowel Disease  Mother. Migraine Headache  Mother. Respiratory Condition  Father.  Pregnancy / Birth History  Age at menarche  61 years. Age of menopause  51-55 Contraceptive History  Contraceptive implant, Oral contraceptives. Gravida  1 Length (months) of breastfeeding  3-6 Maternal age  58-25 Para  1  Other Problems Anxiety Disorder  Arthritis  Back Pain  Gastric Ulcer  High blood pressure  Migraine Headache  Thyroid Disease    Review of Systems  General Not Present- Appetite Loss, Chills, Fatigue, Fever, Night Sweats, Weight Gain and Weight Loss. Skin Present- Dryness. Not Present- Change in Wart/Mole, Hives, Jaundice, New Lesions, Non-Healing Wounds, Rash and Ulcer. HEENT Present- Ringing in the Ears, Seasonal Allergies, Sinus Pain, Sore Throat and Visual Disturbances. Not Present- Earache, Hearing Loss, Hoarseness, Nose Bleed, Oral Ulcers, Wears glasses/contact lenses and Yellow Eyes. Respiratory Not Present- Bloody sputum, Chronic Cough, Difficulty Breathing, Snoring and Wheezing. Breast Not Present- Breast Mass, Breast Pain, Nipple Discharge and Skin Changes. Cardiovascular Not Present- Chest Pain, Difficulty Breathing Lying Down, Leg Cramps, Palpitations, Rapid Heart Rate, Shortness of Breath and Swelling of  Extremities. Gastrointestinal Not Present- Abdominal Pain, Bloating, Bloody Stool, Change in Bowel Habits, Chronic diarrhea, Constipation, Difficulty  Swallowing, Excessive gas, Gets full quickly at meals, Hemorrhoids, Indigestion, Nausea, Rectal Pain and Vomiting. Female Genitourinary Not Present- Frequency, Nocturia, Painful Urination, Pelvic Pain and Urgency. Musculoskeletal Present- Back Pain, Joint Pain, Joint Stiffness, Muscle Pain and Swelling of Extremities. Not Present- Muscle Weakness. Neurological Present- Headaches. Not Present- Decreased Memory, Fainting, Numbness, Seizures, Tingling, Tremor, Trouble walking and Weakness. Psychiatric Present- Anxiety. Not Present- Bipolar, Change in Sleep Pattern, Depression, Fearful and Frequent crying. Endocrine Present- Cold Intolerance and Hair Changes. Not Present- Excessive Hunger, Heat Intolerance, Hot flashes and New Diabetes. Hematology Present- Gland problems. Not Present- Blood Thinners, Easy Bruising, Excessive bleeding, HIV and Persistent Infections.  Vitals  Weight: 193.6 lb Height: 67in Body Surface Area: 1.99 m Body Mass Index: 30.32 kg/m  Temp.: 98.29F  Pulse: 85 (Regular)  BP: 135/72 (Sitting, Left Arm, Standard)   Physical Exam   See vital signs recorded above  GENERAL APPEARANCE Development: normal Nutritional status: normal Gross deformities: none  SKIN Rash, lesions, ulcers: none Induration, erythema: none Nodules: none palpable  EYES Conjunctiva and lids: normal Pupils: equal and reactive Iris: normal bilaterally  EARS, NOSE, MOUTH, THROAT External ears: no lesion or deformity External nose: no lesion or deformity Hearing: grossly normal Lips: no lesion or deformity Dentition: normal for age Oral mucosa: moist  NECK Symmetric: yes Trachea: midline Thyroid: no palpable nodules in the thyroid bed  CHEST Respiratory effort: normal Retraction or accessory muscle use: no Breath sounds:  normal bilaterally Rales, rhonchi, wheeze: none  CARDIOVASCULAR Auscultation: regular rhythm, normal rate Murmurs: none Pulses: carotid and radial pulse 2+ palpable Lower extremity edema: none Lower extremity varicosities: none  MUSCULOSKELETAL Station and gait: normal Digits and nails: no clubbing or cyanosis Muscle strength: grossly normal all extremities Range of motion: grossly normal all extremities Deformity: none  LYMPHATIC Cervical: none palpable Supraclavicular: none palpable  PSYCHIATRIC Oriented to person, place, and time: yes Mood and affect: normal for situation Judgment and insight: appropriate for situation    Assessment & Plan  PRIMARY HYPERPARATHYROIDISM (E21.0)  Pt Education - Pamphlet Given - The Parathyroid Surgery Book: discussed with patient and provided information. Follow Up - Call [facility] office after tests / studies doneto discuss further plans  Patient presents on referral from her primary care provider for evaluation of suspected primary hyperparathyroidism. She is provided with written literature about parathyroid disease to review at home.  Patient has elevated calcium levels and elevated intact PTH level suspicious for primary hyperparathyroidism. However the patient is currently taking hydrochlorothiazide. We will proceed with further evaluation to include a nuclear medicine parathyroid scan. Patient might also benefit from a bone density scan but I will leave this up to her primary care physician.  Patient will be scheduled for a nuclear medicine parathyroid scan in the near future. We will review these results. If the scan is positive for parathyroid adenoma, then I believe the patient would be a good candidate for minimally invasive outpatient surgery. We have discussed the procedure today. We discussed the location of the surgical incision. We discussed postoperative recovery.  If the nuclear medicine scan is negative, then we will  obtain a high-resolution ultrasound or possibly a CT scan of the neck in hopes of identifying the parathyroid adenoma preoperatively.  Patient will undergo nuclear medicine scanning and we will contact her with the results when they are available.  ADDENDUM: Nuclear medicine parathyroid scan localizes a parathyroid adenoma to the left superior position.  Will proceed with minimally invasive parathyroidectomy.  The risks and benefits  of the procedure have been discussed at length with the patient.  The patient understands the proposed procedure, potential alternative treatments, and the course of recovery to be expected.  All of the patient's questions have been answered at this time.  The patient wishes to proceed with surgery.  Armandina Gemma, [place] [facility] Office: [phone]

## 2017-01-04 ENCOUNTER — Encounter (HOSPITAL_COMMUNITY)
Admission: RE | Disposition: A | Discharge status: Home / Self Care | Payer: Self-pay | Source: Ambulatory Visit | Attending: Surgery

## 2017-01-04 ENCOUNTER — Encounter (HOSPITAL_COMMUNITY): Payer: Self-pay | Admitting: *Deleted

## 2017-01-04 ENCOUNTER — Ambulatory Visit (HOSPITAL_COMMUNITY): Payer: Medicare Other | Admitting: Anesthesiology

## 2017-01-04 ENCOUNTER — Ambulatory Visit (HOSPITAL_COMMUNITY)
Admission: RE | Admit: 2017-01-04 | Discharge: 2017-01-04 | Disposition: A | Discharge status: Home / Self Care | Payer: Medicare Other | Source: Ambulatory Visit | Attending: Surgery | Admitting: Surgery

## 2017-01-04 DIAGNOSIS — F17200 Nicotine dependence, unspecified, uncomplicated: Secondary | ICD-10-CM | POA: Insufficient documentation

## 2017-01-04 DIAGNOSIS — K259 Gastric ulcer, unspecified as acute or chronic, without hemorrhage or perforation: Secondary | ICD-10-CM | POA: Insufficient documentation

## 2017-01-04 DIAGNOSIS — Z8249 Family history of ischemic heart disease and other diseases of the circulatory system: Secondary | ICD-10-CM | POA: Diagnosis not present

## 2017-01-04 DIAGNOSIS — F419 Anxiety disorder, unspecified: Secondary | ICD-10-CM | POA: Insufficient documentation

## 2017-01-04 DIAGNOSIS — G4700 Insomnia, unspecified: Secondary | ICD-10-CM | POA: Diagnosis not present

## 2017-01-04 DIAGNOSIS — Z9842 Cataract extraction status, left eye: Secondary | ICD-10-CM | POA: Insufficient documentation

## 2017-01-04 DIAGNOSIS — D351 Benign neoplasm of parathyroid gland: Secondary | ICD-10-CM | POA: Diagnosis not present

## 2017-01-04 DIAGNOSIS — E785 Hyperlipidemia, unspecified: Secondary | ICD-10-CM | POA: Diagnosis not present

## 2017-01-04 DIAGNOSIS — M1990 Unspecified osteoarthritis, unspecified site: Secondary | ICD-10-CM | POA: Insufficient documentation

## 2017-01-04 DIAGNOSIS — Z79899 Other long term (current) drug therapy: Secondary | ICD-10-CM | POA: Diagnosis not present

## 2017-01-04 DIAGNOSIS — Z818 Family history of other mental and behavioral disorders: Secondary | ICD-10-CM | POA: Insufficient documentation

## 2017-01-04 DIAGNOSIS — Z811 Family history of alcohol abuse and dependence: Secondary | ICD-10-CM | POA: Insufficient documentation

## 2017-01-04 DIAGNOSIS — Z9841 Cataract extraction status, right eye: Secondary | ICD-10-CM | POA: Insufficient documentation

## 2017-01-04 DIAGNOSIS — Z8261 Family history of arthritis: Secondary | ICD-10-CM | POA: Diagnosis not present

## 2017-01-04 DIAGNOSIS — E210 Primary hyperparathyroidism: Secondary | ICD-10-CM | POA: Diagnosis not present

## 2017-01-04 DIAGNOSIS — M549 Dorsalgia, unspecified: Secondary | ICD-10-CM | POA: Diagnosis not present

## 2017-01-04 DIAGNOSIS — I10 Essential (primary) hypertension: Secondary | ICD-10-CM | POA: Diagnosis not present

## 2017-01-04 DIAGNOSIS — G43909 Migraine, unspecified, not intractable, without status migrainosus: Secondary | ICD-10-CM | POA: Diagnosis not present

## 2017-01-04 DIAGNOSIS — Z91048 Other nonmedicinal substance allergy status: Secondary | ICD-10-CM | POA: Insufficient documentation

## 2017-01-04 DIAGNOSIS — Z836 Family history of other diseases of the respiratory system: Secondary | ICD-10-CM | POA: Diagnosis not present

## 2017-01-04 HISTORY — PX: PARATHYROIDECTOMY: SHX19

## 2017-01-04 SURGERY — PARATHYROIDECTOMY
Anesthesia: General | Site: Neck | Laterality: Left

## 2017-01-04 MED ORDER — SUGAMMADEX SODIUM 200 MG/2ML IV SOLN
INTRAVENOUS | Status: DC | PRN
  Administered 2017-01-04: 200 mg via INTRAVENOUS

## 2017-01-04 MED ORDER — SUCCINYLCHOLINE CHLORIDE 200 MG/10ML IV SOSY
PREFILLED_SYRINGE | INTRAVENOUS | Status: AC
  Filled 2017-01-04: qty 10

## 2017-01-04 MED ORDER — MIDAZOLAM HCL 2 MG/2ML IJ SOLN
INTRAMUSCULAR | Status: AC
  Filled 2017-01-04: qty 2

## 2017-01-04 MED ORDER — ROCURONIUM BROMIDE 50 MG/5ML IV SOSY
PREFILLED_SYRINGE | INTRAVENOUS | Status: DC | PRN
  Administered 2017-01-04: 30 mg via INTRAVENOUS

## 2017-01-04 MED ORDER — ONDANSETRON HCL 4 MG/2ML IJ SOLN: 4.0000 mg | Freq: Once | INTRAMUSCULAR | Status: DC | PRN

## 2017-01-04 MED ORDER — DEXAMETHASONE SODIUM PHOSPHATE 10 MG/ML IJ SOLN
INTRAMUSCULAR | Status: DC | PRN
  Administered 2017-01-04: 10 mg via INTRAVENOUS

## 2017-01-04 MED ORDER — ROCURONIUM BROMIDE 50 MG/5ML IV SOSY
PREFILLED_SYRINGE | INTRAVENOUS | Status: AC
  Filled 2017-01-04: qty 5

## 2017-01-04 MED ORDER — PROPOFOL 10 MG/ML IV BOLUS
INTRAVENOUS | Status: DC | PRN
  Administered 2017-01-04: 150 mg via INTRAVENOUS

## 2017-01-04 MED ORDER — LIDOCAINE 2% (20 MG/ML) 5 ML SYRINGE
INTRAMUSCULAR | Status: AC
  Filled 2017-01-04: qty 5

## 2017-01-04 MED ORDER — FENTANYL CITRATE (PF) 100 MCG/2ML IJ SOLN
INTRAMUSCULAR | Status: DC | PRN
Start: 2017-01-04 — End: 2017-01-04
  Administered 2017-01-04 (×4): 50 ug via INTRAVENOUS

## 2017-01-04 MED ORDER — ONDANSETRON HCL 4 MG/2ML IJ SOLN
INTRAMUSCULAR | Status: AC
  Filled 2017-01-04: qty 2

## 2017-01-04 MED ORDER — SUCCINYLCHOLINE CHLORIDE 200 MG/10ML IV SOSY
PREFILLED_SYRINGE | INTRAVENOUS | Status: DC | PRN
  Administered 2017-01-04: 120 mg via INTRAVENOUS

## 2017-01-04 MED ORDER — 0.9 % SODIUM CHLORIDE (POUR BTL) OPTIME
TOPICAL | Status: DC | PRN
  Administered 2017-01-04: 1000 mL

## 2017-01-04 MED ORDER — BUPIVACAINE HCL (PF) 0.25 % IJ SOLN
INTRAMUSCULAR | Status: AC
  Filled 2017-01-04: qty 30

## 2017-01-04 MED ORDER — FENTANYL CITRATE (PF) 250 MCG/5ML IJ SOLN
INTRAMUSCULAR | Status: AC
  Filled 2017-01-04: qty 5

## 2017-01-04 MED ORDER — LACTATED RINGERS IV SOLN
INTRAVENOUS | Status: DC | PRN
  Administered 2017-01-04 (×2): via INTRAVENOUS

## 2017-01-04 MED ORDER — TRAMADOL HCL 50 MG PO TABS: 50.0000 mg | ORAL_TABLET | Freq: Four times a day (QID) | ORAL | 0 refills | Status: DC | PRN

## 2017-01-04 MED ORDER — FENTANYL CITRATE (PF) 100 MCG/2ML IJ SOLN: 25.0000 ug | INTRAMUSCULAR | Status: DC | PRN

## 2017-01-04 MED ORDER — EPHEDRINE SULFATE 50 MG/ML IJ SOLN
INTRAMUSCULAR | Status: DC | PRN
  Administered 2017-01-04 (×5): 10 mg via INTRAVENOUS

## 2017-01-04 MED ORDER — LIDOCAINE 2% (20 MG/ML) 5 ML SYRINGE
INTRAMUSCULAR | Status: AC
  Filled 2017-01-04: qty 10

## 2017-01-04 MED ORDER — LIDOCAINE HCL 4 % MT SOLN
OROMUCOSAL | Status: DC | PRN
  Administered 2017-01-04: 3 mL via TOPICAL

## 2017-01-04 MED ORDER — LIDOCAINE 2% (20 MG/ML) 5 ML SYRINGE
INTRAMUSCULAR | Status: DC | PRN
Start: 2017-01-04 — End: 2017-01-04
  Administered 2017-01-04: 60 mg via INTRAVENOUS

## 2017-01-04 MED ORDER — BUPIVACAINE HCL 0.25 % IJ SOLN
INTRAMUSCULAR | Status: DC | PRN
  Administered 2017-01-04: 10 mL

## 2017-01-04 MED ORDER — CEFAZOLIN SODIUM-DEXTROSE 2-4 GM/100ML-% IV SOLN
2.0000 g | INTRAVENOUS | Status: AC
  Administered 2017-01-04: 2 g via INTRAVENOUS
  Filled 2017-01-04: qty 100

## 2017-01-04 MED ORDER — PROPOFOL 10 MG/ML IV BOLUS
INTRAVENOUS | Status: AC
  Filled 2017-01-04: qty 20

## 2017-01-04 MED ORDER — SUGAMMADEX SODIUM 200 MG/2ML IV SOLN
INTRAVENOUS | Status: AC
  Filled 2017-01-04: qty 2

## 2017-01-04 MED ORDER — ONDANSETRON HCL 4 MG/2ML IJ SOLN
INTRAMUSCULAR | Status: DC | PRN
  Administered 2017-01-04: 4 mg via INTRAVENOUS

## 2017-01-04 MED ORDER — CHLORHEXIDINE GLUCONATE CLOTH 2 % EX PADS: 6.0000 | MEDICATED_PAD | Freq: Once | CUTANEOUS | Status: DC

## 2017-01-04 MED ORDER — DEXAMETHASONE SODIUM PHOSPHATE 10 MG/ML IJ SOLN
INTRAMUSCULAR | Status: AC
  Filled 2017-01-04: qty 1

## 2017-01-04 MED ORDER — TRAMADOL HCL 50 MG PO TABS
50.0000 mg | ORAL_TABLET | Freq: Four times a day (QID) | ORAL | Status: DC
  Administered 2017-01-04: 50 mg via ORAL
  Filled 2017-01-04: qty 1

## 2017-01-04 SURGICAL SUPPLY — 36 items
ATTRACTOMAT 16X20 MAGNETIC DRP (DRAPES) ×3 IMPLANT
BLADE HEX COATED 2.75 (ELECTRODE) ×3 IMPLANT
BLADE SURG 15 STRL LF DISP TIS (BLADE) ×1 IMPLANT
BLADE SURG 15 STRL SS (BLADE) ×2
CHLORAPREP W/TINT 26ML (MISCELLANEOUS) ×6 IMPLANT
CLIP VESOCCLUDE MED 6/CT (CLIP) ×6 IMPLANT
CLIP VESOCCLUDE SM WIDE 6/CT (CLIP) ×6 IMPLANT
CLOSURE WOUND 1/2 X4 (GAUZE/BANDAGES/DRESSINGS)
DERMABOND ADVANCED (GAUZE/BANDAGES/DRESSINGS) ×2
DERMABOND ADVANCED .7 DNX12 (GAUZE/BANDAGES/DRESSINGS) ×1 IMPLANT
DRAPE LAPAROTOMY T 98X78 PEDS (DRAPES) ×3 IMPLANT
ELECT PENCIL ROCKER SW 15FT (MISCELLANEOUS) ×3 IMPLANT
ELECT REM PT RETURN 15FT ADLT (MISCELLANEOUS) ×3 IMPLANT
GAUZE SPONGE 4X4 16PLY XRAY LF (GAUZE/BANDAGES/DRESSINGS) ×3 IMPLANT
GLOVE SURG ORTHO 8.0 STRL STRW (GLOVE) ×3 IMPLANT
GOWN STRL REUS W/TWL XL LVL3 (GOWN DISPOSABLE) ×9 IMPLANT
HEMOSTAT SURGICEL 2X4 FIBR (HEMOSTASIS) IMPLANT
ILLUMINATOR WAVEGUIDE N/F (MISCELLANEOUS) IMPLANT
KIT BASIN OR (CUSTOM PROCEDURE TRAY) ×3 IMPLANT
LIGHT WAVEGUIDE WIDE FLAT (MISCELLANEOUS) IMPLANT
NEEDLE HYPO 25X1 1.5 SAFETY (NEEDLE) ×3 IMPLANT
PACK BASIC VI WITH GOWN DISP (CUSTOM PROCEDURE TRAY) ×3 IMPLANT
POWDER SURGICEL 3.0 GRAM (HEMOSTASIS) IMPLANT
STAPLER VISISTAT 35W (STAPLE) ×3 IMPLANT
STRIP CLOSURE SKIN 1/2X4 (GAUZE/BANDAGES/DRESSINGS) IMPLANT
SUT MNCRL AB 4-0 PS2 18 (SUTURE) ×3 IMPLANT
SUT SILK 2 0 (SUTURE)
SUT SILK 2-0 18XBRD TIE 12 (SUTURE) IMPLANT
SUT SILK 3 0 (SUTURE)
SUT SILK 3-0 18XBRD TIE 12 (SUTURE) IMPLANT
SUT VIC AB 3-0 SH 18 (SUTURE) ×3 IMPLANT
SYR BULB IRRIGATION 50ML (SYRINGE) ×3 IMPLANT
SYR CONTROL 10ML LL (SYRINGE) ×3 IMPLANT
TOWEL OR 17X26 10 PK STRL BLUE (TOWEL DISPOSABLE) ×3 IMPLANT
TOWEL OR NON WOVEN STRL DISP B (DISPOSABLE) ×3 IMPLANT
YANKAUER SUCT BULB TIP 10FT TU (MISCELLANEOUS) ×3 IMPLANT

## 2017-01-04 NOTE — Interval H&P Note (Signed)
History and Physical Interval Note:  01/04/2017 11:39 AM  Alexis Cole  has presented today for surgery, with the diagnosis of primary hyperparathyroidism.  The various methods of treatment have been discussed with the patient and family. After consideration of risks, benefits and other options for treatment, the patient has consented to    Procedure(s): LEFT SUPERIOR PARATHYROIDECTOMY (Left) as a surgical intervention .    The patient's history has been reviewed, patient examined, no change in status, stable for surgery.  I have reviewed the patient's chart and labs.  Questions were answered to the patient's satisfaction.    Armandina Gemma, [place] [facility] Office: [place]

## 2017-01-04 NOTE — Anesthesia Preprocedure Evaluation (Addendum)
Anesthesia Evaluation  Patient identified by MRN, date of birth, ID band Patient awake    Reviewed: Allergy & Precautions, H&P , NPO status , Patient's Chart, lab work & pertinent test results  Airway Mallampati: II  TM Distance: >3 FB Neck ROM: full    Dental  (+) Dental Advisory Given, Teeth Intact   Pulmonary Current Smoker,    breath sounds clear to auscultation       Cardiovascular hypertension, Pt. on medications (-) Past MI  Rhythm:Regular Rate:Normal  TTE 2018 - Ejection fraction was in the range of 60% to 65%. Grade 1 diastolic dysfunction. PASP was mildly increased. PA  peak pressure: 32 mm Hg (S).   Neuro/Psych  Headaches, Anxiety Depression Insomnia   GI/Hepatic PUD,   Endo/Other  neg diabetesObesity  Renal/GU      Musculoskeletal  (+) Arthritis ,   Abdominal   Peds  Hematology   Anesthesia Other Findings   Reproductive/Obstetrics                           Anesthesia Physical  Anesthesia Plan  ASA: II  Anesthesia Plan: General   Post-op Pain Management:    Induction: Intravenous  PONV Risk Score and Plan: 3 and Dexamethasone, Ondansetron and Treatment may vary due to age or medical condition  Airway Management Planned: Oral ETT  Additional Equipment: None  Intra-op Plan:   Post-operative Plan: Extubation in OR  Informed Consent: I have reviewed the patients History and Physical, chart, labs and discussed the procedure including the risks, benefits and alternatives for the proposed anesthesia with the patient or authorized representative who has indicated his/her understanding and acceptance.   Dental advisory given  Plan Discussed with: CRNA  Anesthesia Plan Comments:        Anesthesia Quick Evaluation

## 2017-01-04 NOTE — Anesthesia Procedure Notes (Signed)
Procedure Name: Intubation Date/Time: 01/04/2017 12:02 PM Performed by: Maxwell Caul, CRNA Pre-anesthesia Checklist: Patient identified, Emergency Drugs available, Suction available and Patient being monitored Patient Re-evaluated:Patient Re-evaluated prior to induction Oxygen Delivery Method: Circle system utilized Preoxygenation: Pre-oxygenation with 100% oxygen Induction Type: IV induction Ventilation: Oral airway inserted - appropriate to patient size and Mask ventilation without difficulty Laryngoscope Size: Mac and 3 Grade View: Grade I Tube type: Oral Laser Tube: Cuffed inflated with minimal occlusive pressure - saline Tube size: 7.0 mm Number of attempts: 1 Airway Equipment and Method: Patient positioned with wedge pillow and Stylet Placement Confirmation: ETT inserted through vocal cords under direct vision,  positive ETCO2 and breath sounds checked- equal and bilateral Secured at: 22 cm Tube secured with: Tape Dental Injury: Teeth and Oropharynx as per pre-operative assessment

## 2017-01-04 NOTE — Op Note (Signed)
OPERATIVE REPORT - PARATHYROIDECTOMY  Preoperative diagnosis: Primary hyperparathyroidism  Postop diagnosis: Same  Procedure: Left superior minimally invasive parathyroidectomy  Surgeon:  Armandina Gemma, MD  Anesthesia: General endotracheal  Estimated blood loss: Minimal  Preparation: ChloraPrep  Indications: Patient is referred by her primary care physician at [facility] family practice for evaluation and management of suspected primary hyperparathyroidism. Patient had been noted in the spring of 2018 to have elevated serum calcium levels. Her highest level was recorded in February 2018 at 12.9. Subsequent levels of range from 10.5-11.3. Intact PTH level was elevated at 133. Vitamin D level was normal at 28. 24-hour urine collection for calcium was normal at 133. Patient denies any symptoms. She denies fatigue. She denies bone or joint pain. She denies any recent fractures. She denies nephrolithiasis. Patient has had no prior head or neck surgery. Nuclear medicine parathyroid scan localizes and adenoma to the left superior position.  Procedure: The patient was prepared in the pre-operative holding area. The patient was brought to the operating room and placed in a supine position on the operating room table. Following administration of general anesthesia, the patient was positioned and then prepped and draped in the usual strict aseptic fashion. After ascertaining that an adequate level of anesthesia been achieved, a neck incision was made with a #15 blade. Dissection was carried through subcutaneous tissues and platysma. Hemostasis was obtained with the electrocautery. Skin flaps were developed circumferentially and a Weitlander retractor was placed for exposure.  Strap muscles were incised in the midline. Strap muscles were reflected lateralley exposing the thyroid lobe. With gentle blunt dissection the thyroid lobe was mobilized.  Dissection was carried through adipose tissue and an  enlarged parathyroid gland was identified. It was gently mobilized. Vascular structures were divided between small and medium ligaclips. Care was taken to avoid the recurrent laryngeal nerve and the esophagus. The parathyroid gland was completely excised. It was submitted to pathology where frozen section confirmed parathyroid tissue consistent with adenoma.  Neck was irrigated with warm saline and good hemostasis was noted. Fibrillar was placed in the operative field. Strap muscles were reapproximated in the midline with interrupted 3-0 Vicryl sutures. Platysma was closed with interrupted 3-0 Vicryl sutures. Skin was closed with a running 4-0 Monocryl subcuticular suture. Marcaine was infiltrated circumferentially. Wound was washed and dried and Steristrips were applied. Patient was awakened from anesthesia and brought to the recovery room. The patient tolerated the procedure well.   Armandina Gemma, MD [facility] Office: [phone]

## 2017-01-04 NOTE — Anesthesia Postprocedure Evaluation (Signed)
Anesthesia Post Note  Patient: ADIN LARICCIA  Procedure(s) Performed: LEFT SUPERIOR PARATHYROIDECTOMY (Left Neck)     Patient location during evaluation: PACU Anesthesia Type: General Level of consciousness: awake and alert Pain management: pain level controlled Vital Signs Assessment: post-procedure vital signs reviewed and stable Respiratory status: spontaneous breathing, nonlabored ventilation, respiratory function stable and patient connected to nasal cannula oxygen Cardiovascular status: blood pressure returned to baseline and stable Postop Assessment: no apparent nausea or vomiting Anesthetic complications: no    Last Vitals:  Vitals:   01/04/17 1340 01/04/17 1350  BP: (!) 148/78 137/84  Pulse: 92 96  Resp: 16 16  Temp: 36.9 C 37 C  SpO2: 93% 93%    Last Pain:  Vitals:   01/04/17 1400  TempSrc:   PainSc: [place]

## 2017-01-04 NOTE — Discharge Instructions (Signed)
Parathyroidectomy, Care After °Refer to this sheet in the next few weeks. These instructions provide you with information on caring for yourself after your procedure. Your health care provider may also give you more specific instructions. Your treatment has been planned according to current medical practices, but problems sometimes occur. Call your health care provider if you have any problems or questions after your procedure. °What can I expect after the procedure? °After your procedure, it is typical to have the following: °· Tingling or numbness around your mouth or in your fingers or toes. °· Temporary hoarseness. ° °Follow these instructions at home: °· Take medicines only as directed by your health care provider. °· There are many different ways to close and cover an incision, including stitches, skin glue, and adhesive strips. Follow your health care provider's instructions on: °? Incision care. °? Bandage (dressing) changes and removal. °? Incision closure removal. °· Do not take baths, swim, or use a hot tub until your health care provider approves. °· Follow your health care provider’s instructions for eating and drinking. You may need to consume only liquids and soft foods for a day after the procedure. °· Rest for the first few days as you heal from the surgery. It may take that long before you can resume all of your usual activities. °· Keep all follow-up visits as directed by your health care provider. This is important. Your health care provider needs to monitor the calcium level in your blood to make sure that it does not become low. °Contact a health care provider if: °· You have tingling or numbness in your lips, toes, or fingers that does not go away within a few days. °· You have drainage, redness, swelling, or pain at your incision site. °· You have trouble talking. °· You have nausea or vomiting for more than 2 days. °· You have a fever. °Get help right away if: °You have trouble breathing.    °This information is not intended to replace advice given to you by your health care provider. Make sure you discuss any questions you have with your health care provider. °Document Released: 09/03/2013 Document Revised: 10/10/2015 Document Reviewed: 07/02/2013 °Elsevier Interactive Patient Education © 2018 [employer] ° °General Anesthesia, Adult, Care After °These instructions provide you with information about caring for yourself after your procedure. Your health care provider may also give you more specific instructions. Your treatment has been planned according to current medical practices, but problems sometimes occur. Call your health care provider if you have any problems or questions after your procedure. °What can I expect after the procedure? °After the procedure, it is common to have: °· Vomiting. °· A sore throat. °· Mental slowness. ° °It is common to feel: °· Nauseous. °· Cold or shivery. °· Sleepy. °· Tired. °· Sore or achy, even in parts of your body where you did not have surgery. ° °Follow these instructions at home: °For at least 24 hours after the procedure: °· Do not: °? Participate in activities where you could fall or become injured. °? Drive. °? Use heavy machinery. °? Drink alcohol. °? Take sleeping pills or medicines that cause drowsiness. °? Make important decisions or sign legal documents. °? Take care of children on your own. °· Rest. °Eating and drinking °· If you vomit, drink water, juice, or soup when you can drink without vomiting. °· Drink enough fluid to keep your urine clear or pale yellow. °· Make sure you have little or no nausea before eating   solid foods. °· Follow the diet recommended by your health care provider. °General instructions °· Have a responsible adult stay with you until you are awake and alert. °· Return to your normal activities as told by your health care provider. Ask your health care provider what activities are safe for you. °· Take over-the-counter and  prescription medicines only as told by your health care provider. °· If you smoke, do not smoke without supervision. °· Keep all follow-up visits as told by your health care provider. This is important. °Contact a health care provider if: °· You continue to have nausea or vomiting at home, and medicines are not helpful. °· You cannot drink fluids or start eating again. °· You cannot urinate after 8-12 hours. °· You develop a skin rash. °· You have fever. °· You have increasing redness at the site of your procedure. °Get help right away if: °· You have difficulty breathing. °· You have chest pain. °· You have unexpected bleeding. °· You feel that you are having a life-threatening or urgent problem. °This information is not intended to replace advice given to you by your health care provider. Make sure you discuss any questions you have with your health care provider. °Document Released: 05/15/2000 Document Revised: 07/12/2015 Document Reviewed: 01/21/2015 °Elsevier Interactive Patient Education © 2018 Elsevier Inc. ° °

## 2017-01-04 NOTE — Transfer of Care (Signed)
Immediate Anesthesia Transfer of Care Note  Patient: Alexis Cole  Procedure(s) Performed: LEFT SUPERIOR PARATHYROIDECTOMY (Left Neck)  Patient Location: PACU  Anesthesia Type:General  Level of Consciousness: awake, alert  and oriented  Airway & Oxygen Therapy: Patient Spontanous Breathing and Patient connected to face mask oxygen  Post-op Assessment: Report given to RN and Post -op Vital signs reviewed and stable  Post vital signs: Reviewed and stable  Last Vitals:  Vitals:   01/04/17 1000 01/04/17 1048  BP: (!) 183/98 (!) 143/82  Pulse: 87 74  Resp: 18   Temp: 36.9 C   SpO2: 100%     Last Pain:  Vitals:   01/04/17 1037  TempSrc:   PainSc: 7       Patients Stated Pain Goal: 4 (34/35/68 6168)  Complications: No apparent anesthesia complications

## 2017-01-04 NOTE — Progress Notes (Signed)
Report given to Ivor Costa, RN.

## 2017-01-05 ENCOUNTER — Encounter (HOSPITAL_COMMUNITY): Payer: Self-pay | Admitting: Surgery

## 2017-01-05 NOTE — Progress Notes (Signed)
Please contact patient and notify of benign pathology results.  Isaack Preble M. Annabella Elford, MD, FACS [facility] Office: [phone]   

## 2017-01-15 DIAGNOSIS — E210 Primary hyperparathyroidism: Secondary | ICD-10-CM | POA: Diagnosis not present

## 2017-01-18 DIAGNOSIS — M1712 Unilateral primary osteoarthritis, left knee: Secondary | ICD-10-CM | POA: Diagnosis not present

## 2017-01-18 DIAGNOSIS — M1711 Unilateral primary osteoarthritis, right knee: Secondary | ICD-10-CM | POA: Diagnosis not present

## 2017-01-25 ENCOUNTER — Other Ambulatory Visit: Payer: Self-pay | Admitting: Orthopedic Surgery

## 2017-02-05 ENCOUNTER — Other Ambulatory Visit: Payer: Self-pay | Admitting: Internal Medicine

## 2017-02-05 DIAGNOSIS — G4700 Insomnia, unspecified: Secondary | ICD-10-CM

## 2017-02-05 MED ORDER — GABAPENTIN 300 MG PO CAPS: 300.0000 mg | ORAL_CAPSULE | Freq: Three times a day (TID) | ORAL | 3 refills | Status: DC

## 2017-02-08 ENCOUNTER — Other Ambulatory Visit: Payer: Self-pay | Admitting: Internal Medicine

## 2017-02-08 DIAGNOSIS — F341 Dysthymic disorder: Secondary | ICD-10-CM

## 2017-02-08 MED ORDER — BUSPIRONE HCL 10 MG PO TABS: 20.0000 mg | ORAL_TABLET | Freq: Two times a day (BID) | ORAL | 3 refills | Status: DC

## 2017-02-08 NOTE — Telephone Encounter (Signed)
Pt is calling for a refill buspirone.She needs this sent to [facility] since all the pharmacy's are having trouble getting this in from the vendor they use. [facility] will need for [place] to call them since the only name they have a file is Dr. Brita Romp. She said that she has enough medication for about 10 days. If you have any questions please call her. jw

## 2017-02-16 ENCOUNTER — Other Ambulatory Visit: Payer: Self-pay | Admitting: Internal Medicine

## 2017-02-16 DIAGNOSIS — F341 Dysthymic disorder: Secondary | ICD-10-CM

## 2017-02-16 MED ORDER — BUSPIRONE HCL 10 MG PO TABS: 20.0000 mg | ORAL_TABLET | Freq: Two times a day (BID) | ORAL | 3 refills | Status: DC

## 2017-02-19 DIAGNOSIS — M25562 Pain in left knee: Secondary | ICD-10-CM | POA: Diagnosis not present

## 2017-02-19 DIAGNOSIS — M1712 Unilateral primary osteoarthritis, left knee: Secondary | ICD-10-CM | POA: Diagnosis not present

## 2017-02-23 ENCOUNTER — Encounter (HOSPITAL_COMMUNITY): Payer: Self-pay

## 2017-02-23 NOTE — Patient Instructions (Addendum)
Your procedure is scheduled on: Friday, Jan. 18, 2019   Surgery Time:  7:30AM-10:00AM   Report to [place]  Entrance   Follow map to Short Stay on first floor at 5:30 AM   Call this number if you have problems the morning of surgery [phone]   Do not eat food or drink liquids :After Midnight.   Do NOT smoke after Midnight   Take these medicines the morning of surgery with A SIP OF WATER: Atorvastatin, Buspirone, Escitalopram, Gabapentin                               You may not have any metal on your body including hair pins, jewelry, and body piercings             Do not wear make-up, lotions, powders, perfumes/cologne, or deodorant             Do not wear nail polish.  Do not shave  48 hours prior to surgery.                Do not bring valuables to the hospital. [place].   Contacts, dentures or bridgework may not be worn into surgery.   Leave suitcase in the car. After surgery it may be brought to your room.   Special Instructions: Bring a copy of your healthcare power of attorney and living will documents         the day of surgery if you haven't scanned them in before.              Please read over the following fact sheets you were given:   [facility] - Preparing for Surgery Before surgery, you can play an important role.  Because skin is not sterile, your skin needs to be as free of germs as possible.  You can reduce the number of germs on your skin by washing with CHG (chlorahexidine gluconate) soap before surgery.  CHG is an antiseptic cleaner which kills germs and bonds with the skin to continue killing germs even after washing. Please DO NOT use if you have an allergy to CHG or antibacterial soaps.  If your skin becomes reddened/irritated stop using the CHG and inform your nurse when you arrive at Short Stay. Do not shave (including legs and underarms) for at least 48 hours prior to the first  CHG shower.  You may shave your face/neck.  Please follow these instructions carefully:  1.  Shower with CHG Soap the night before surgery and the  morning of surgery.  2.  If you choose to wash your hair, wash your hair first as usual with your normal  shampoo.  3.  After you shampoo, rinse your hair and body thoroughly to remove the shampoo.                             4.  Use CHG as you would any other liquid soap.  You can apply chg directly to the skin and wash.  Gently with a scrungie or clean washcloth.  5.  Apply the CHG Soap to your body ONLY FROM THE NECK DOWN.   Do   not use on face/ open  Wound or open sores. Avoid contact with eyes, ears mouth and   genitals (private parts).                       Wash face,  Genitals (private parts) with your normal soap.             6.  Wash thoroughly, paying special attention to the area where your    surgery  will be performed.  7.  Thoroughly rinse your body with warm water from the neck down.  8.  DO NOT shower/wash with your normal soap after using and rinsing off the CHG Soap.                9.  Pat yourself dry with a clean towel.            10.  Wear clean pajamas.            11.  Place clean sheets on your bed the night of your first shower and do not  sleep with pets. Day of Surgery : Do not apply any lotions/deodorants the morning of surgery.  Please wear clean clothes to the hospital/surgery center.  FAILURE TO FOLLOW THESE INSTRUCTIONS MAY RESULT IN THE CANCELLATION OF YOUR SURGERY  PATIENT SIGNATURE_________________________________  NURSE SIGNATURE__________________________________  ________________________________________________________________________   Alexis Cole  An incentive spirometer is a tool that can help keep your lungs clear and active. This tool measures how well you are filling your lungs with each breath. Taking long deep breaths may help reverse or decrease the chance of  developing breathing (pulmonary) problems (especially infection) following:  A long period of time when you are unable to move or be active. BEFORE THE PROCEDURE   If the spirometer includes an indicator to show your best effort, your nurse or respiratory therapist will set it to a desired goal.  If possible, sit up straight or lean slightly forward. Try not to slouch.  Hold the incentive spirometer in an upright position. INSTRUCTIONS FOR USE  1. Sit on the edge of your bed if possible, or sit up as far as you can in bed or on a chair. 2. Hold the incentive spirometer in an upright position. 3. Breathe out normally. 4. Place the mouthpiece in your mouth and seal your lips tightly around it. 5. Breathe in slowly and as deeply as possible, raising the piston or the ball toward the top of the column. 6. Hold your breath for 3-5 seconds or for as long as possible. Allow the piston or ball to fall to the bottom of the column. 7. Remove the mouthpiece from your mouth and breathe out normally. 8. Rest for a few seconds and repeat Steps 1 through 7 at least 10 times every 1-2 hours when you are awake. Take your time and take a few normal breaths between deep breaths. 9. The spirometer may include an indicator to show your best effort. Use the indicator as a goal to work toward during each repetition. 10. After each set of 10 deep breaths, practice coughing to be sure your lungs are clear. If you have an incision (the cut made at the time of surgery), support your incision when coughing by placing a pillow or rolled up towels firmly against it. Once you are able to get out of bed, walk around indoors and cough well. You may stop using the incentive spirometer when instructed by your caregiver.  RISKS AND COMPLICATIONS  Take your time  so you do not get dizzy or light-headed.  If you are in pain, you may need to take or ask for pain medication before doing incentive spirometry. It is harder to take a  deep breath if you are having pain. AFTER USE  Rest and breathe slowly and easily.  It can be helpful to keep track of a log of your progress. Your caregiver can provide you with a simple table to help with this. If you are using the spirometer at home, follow these instructions: [place] IF:   You are having difficultly using the spirometer.  You have trouble using the spirometer as often as instructed.  Your pain medication is not giving enough relief while using the spirometer.  You develop fever of 100.5 F (38.1 C) or higher. SEEK IMMEDIATE MEDICAL CARE IF:   You cough up bloody sputum that had not been present before.  You develop fever of 102 F (38.9 C) or greater.  You develop worsening pain at or near the incision site. MAKE SURE YOU:   Understand these instructions.  Will watch your condition.  Will get help right away if you are not doing well or get worse. Document Released: 06/19/2006 Document Revised: 05/01/2011 Document Reviewed: 08/20/2006 Exit[employer] Patient Information 2014 [employer], [place].   ________________________________________________________________________  WHAT IS A BLOOD TRANSFUSION? Blood Transfusion Information  A transfusion is the replacement of blood or some of its parts. Blood is made up of multiple cells which provide different functions.  Red blood cells carry oxygen and are used for blood loss replacement.  White blood cells fight against infection.  Platelets control bleeding.  Plasma helps clot blood.  Other blood products are available for specialized needs, such as hemophilia or other clotting disorders. BEFORE THE TRANSFUSION  Who gives blood for transfusions?   Healthy volunteers who are fully evaluated to make sure their blood is safe. This is blood bank blood. Transfusion therapy is the safest it has ever been in the practice of medicine. Before blood is taken from a donor, a complete history is taken to make  sure that person has no history of diseases nor engages in risky social behavior (examples are intravenous drug use or sexual activity with multiple partners). The donor's travel history is screened to minimize risk of transmitting infections, such as malaria. The donated blood is tested for signs of infectious diseases, such as HIV and hepatitis. The blood is then tested to be sure it is compatible with you in order to minimize the chance of a transfusion reaction. If you or a relative donates blood, this is often done in anticipation of surgery and is not appropriate for emergency situations. It takes many days to process the donated blood. RISKS AND COMPLICATIONS Although transfusion therapy is very safe and saves many lives, the main dangers of transfusion include:   Getting an infectious disease.  Developing a transfusion reaction. This is an allergic reaction to something in the blood you were given. Every precaution is taken to prevent this. The decision to have a blood transfusion has been considered carefully by your caregiver before blood is given. Blood is not given unless the benefits outweigh the risks. AFTER THE TRANSFUSION  Right after receiving a blood transfusion, you will usually feel much better and more energetic. This is especially true if your red blood cells have gotten low (anemic). The transfusion raises the level of the red blood cells which carry oxygen, and this usually causes an energy increase.  The  nurse administering the transfusion will monitor you carefully for complications. HOME CARE INSTRUCTIONS  No special instructions are needed after a transfusion. You may find your energy is better. Speak with your caregiver about any limitations on activity for underlying diseases you may have. SEEK MEDICAL CARE IF:   Your condition is not improving after your transfusion.  You develop redness or irritation at the intravenous (IV) site. SEEK IMMEDIATE MEDICAL CARE IF:   Any of the following symptoms occur over the next 12 hours:  Shaking chills.  You have a temperature by mouth above 102 F (38.9 C), not controlled by medicine.  Chest, back, or muscle pain.  People around you feel you are not acting correctly or are confused.  Shortness of breath or difficulty breathing.  Dizziness and fainting.  You get a rash or develop hives.  You have a decrease in urine output.  Your urine turns a dark color or changes to pink, red, or brown. Any of the following symptoms occur over the next 10 days:  You have a temperature by mouth above 102 F (38.9 C), not controlled by medicine.  Shortness of breath.  Weakness after normal activity.  The white part of the eye turns yellow (jaundice).  You have a decrease in the amount of urine or are urinating less often.  Your urine turns a dark color or changes to pink, red, or brown. Document Released: 02/04/2000 Document Revised: 05/01/2011 Document Reviewed: 09/23/2007 [employer] Patient Information 2014 [place], [place].  _______________________________________________________________________

## 2017-02-23 NOTE — Pre-Procedure Instructions (Signed)
The following are in epic: EKG, CXR, CT abd/pelvis, and CT head 06/08/16 ECHO 06/09/16

## 2017-02-27 ENCOUNTER — Other Ambulatory Visit: Payer: Self-pay

## 2017-02-27 ENCOUNTER — Encounter (HOSPITAL_COMMUNITY)
Admission: RE | Admit: 2017-02-27 | Discharge: 2017-02-27 | Disposition: A | Discharge status: Home / Self Care | Payer: Medicare Other | Source: Ambulatory Visit | Attending: Orthopedic Surgery | Admitting: Orthopedic Surgery

## 2017-02-27 ENCOUNTER — Ambulatory Visit (HOSPITAL_COMMUNITY)
Admission: RE | Admit: 2017-02-27 | Discharge: 2017-02-27 | Disposition: A | Discharge status: Home / Self Care | Payer: Medicare Other | Source: Ambulatory Visit | Attending: Orthopedic Surgery | Admitting: Orthopedic Surgery

## 2017-02-27 ENCOUNTER — Encounter (HOSPITAL_COMMUNITY): Payer: Self-pay

## 2017-02-27 DIAGNOSIS — E210 Primary hyperparathyroidism: Secondary | ICD-10-CM | POA: Diagnosis not present

## 2017-02-27 DIAGNOSIS — E785 Hyperlipidemia, unspecified: Secondary | ICD-10-CM | POA: Diagnosis not present

## 2017-02-27 DIAGNOSIS — Z96652 Presence of left artificial knee joint: Secondary | ICD-10-CM | POA: Diagnosis not present

## 2017-02-27 DIAGNOSIS — Z01818 Encounter for other preprocedural examination: Secondary | ICD-10-CM | POA: Insufficient documentation

## 2017-02-27 DIAGNOSIS — Z01810 Encounter for preprocedural cardiovascular examination: Secondary | ICD-10-CM | POA: Insufficient documentation

## 2017-02-27 DIAGNOSIS — R918 Other nonspecific abnormal finding of lung field: Secondary | ICD-10-CM | POA: Diagnosis not present

## 2017-02-27 DIAGNOSIS — I444 Left anterior fascicular block: Secondary | ICD-10-CM | POA: Diagnosis not present

## 2017-02-27 DIAGNOSIS — K760 Fatty (change of) liver, not elsewhere classified: Secondary | ICD-10-CM | POA: Insufficient documentation

## 2017-02-27 DIAGNOSIS — I700 Atherosclerosis of aorta: Secondary | ICD-10-CM | POA: Diagnosis not present

## 2017-02-27 DIAGNOSIS — I10 Essential (primary) hypertension: Secondary | ICD-10-CM | POA: Insufficient documentation

## 2017-02-27 DIAGNOSIS — F329 Major depressive disorder, single episode, unspecified: Secondary | ICD-10-CM | POA: Diagnosis not present

## 2017-02-27 DIAGNOSIS — F17210 Nicotine dependence, cigarettes, uncomplicated: Secondary | ICD-10-CM | POA: Insufficient documentation

## 2017-02-27 DIAGNOSIS — F17200 Nicotine dependence, unspecified, uncomplicated: Secondary | ICD-10-CM | POA: Diagnosis not present

## 2017-02-27 DIAGNOSIS — Z471 Aftercare following joint replacement surgery: Secondary | ICD-10-CM | POA: Diagnosis not present

## 2017-02-27 DIAGNOSIS — Z79899 Other long term (current) drug therapy: Secondary | ICD-10-CM | POA: Diagnosis not present

## 2017-02-27 DIAGNOSIS — F419 Anxiety disorder, unspecified: Secondary | ICD-10-CM | POA: Diagnosis not present

## 2017-02-27 DIAGNOSIS — J986 Disorders of diaphragm: Secondary | ICD-10-CM | POA: Diagnosis not present

## 2017-02-27 DIAGNOSIS — G4700 Insomnia, unspecified: Secondary | ICD-10-CM | POA: Diagnosis not present

## 2017-02-27 DIAGNOSIS — M1712 Unilateral primary osteoarthritis, left knee: Secondary | ICD-10-CM | POA: Insufficient documentation

## 2017-02-27 HISTORY — DX: Post-traumatic stress disorder, unspecified: F43.10

## 2017-02-27 HISTORY — DX: Hyperparathyroidism, unspecified: E21.3

## 2017-02-27 HISTORY — DX: Obesity, unspecified: E66.9

## 2017-02-27 HISTORY — DX: Personal history of other specified conditions: Z87.898

## 2017-02-27 HISTORY — DX: Adverse effect of unspecified anesthetic, initial encounter: T41.45XA

## 2017-02-27 HISTORY — DX: Cardiac murmur, unspecified: R01.1

## 2017-02-27 HISTORY — DX: Hyperlipidemia, unspecified: E78.5

## 2017-02-27 HISTORY — DX: Lactose intolerance, unspecified: E73.9

## 2017-02-27 HISTORY — DX: Hypercalcemia: E83.52

## 2017-02-27 HISTORY — DX: Diverticulitis of intestine, part unspecified, without perforation or abscess without bleeding: K57.92

## 2017-02-27 HISTORY — DX: Diverticulosis of intestine, part unspecified, without perforation or abscess without bleeding: K57.90

## 2017-02-27 HISTORY — DX: Fatty (change of) liver, not elsewhere classified: K76.0

## 2017-02-27 HISTORY — DX: Atherosclerosis of aorta: I70.0

## 2017-02-27 HISTORY — DX: Other complications of anesthesia, initial encounter: T88.59XA

## 2017-02-27 HISTORY — DX: Essential (primary) hypertension: I10

## 2017-02-27 HISTORY — DX: Urge incontinence: N39.41

## 2017-02-27 HISTORY — DX: Diarrhea, unspecified: R19.7

## 2017-02-27 HISTORY — DX: Pneumonia, unspecified organism: J18.9

## 2017-02-27 LAB — URINALYSIS, ROUTINE W REFLEX MICROSCOPIC
BACTERIA UA: NONE SEEN
BILIRUBIN URINE: NEGATIVE
Glucose, UA: NEGATIVE mg/dL
Ketones, ur: NEGATIVE mg/dL
Leukocytes, UA: NEGATIVE
Nitrite: NEGATIVE
PH: 6 (ref 5.0–8.0)
Protein, ur: NEGATIVE mg/dL
Specific Gravity, Urine: 1.005 (ref 1.005–1.030)

## 2017-02-27 LAB — CBC WITH DIFFERENTIAL/PLATELET
BASOS ABS: 0.1 10*3/uL (ref 0.0–0.1)
Basophils Relative: 1 %
Eosinophils Absolute: 0.1 10*3/uL (ref 0.0–0.7)
Eosinophils Relative: 1 %
HEMATOCRIT: 43.2 % (ref 36.0–46.0)
HEMOGLOBIN: 14.3 g/dL (ref 12.0–15.0)
LYMPHS ABS: 3.8 10*3/uL (ref 0.7–4.0)
LYMPHS PCT: 38 %
MCH: 30.6 pg (ref 26.0–34.0)
MCHC: 33.1 g/dL (ref 30.0–36.0)
MCV: 92.5 fL (ref 78.0–100.0)
Monocytes Absolute: 0.8 10*3/uL (ref 0.1–1.0)
Monocytes Relative: 8 %
NEUTROS ABS: 5.1 10*3/uL (ref 1.7–7.7)
Neutrophils Relative %: 52 %
Platelets: 211 10*3/uL (ref 150–400)
RBC: 4.67 MIL/uL (ref 3.87–5.11)
RDW: 13.4 % (ref 11.5–15.5)
WBC: 9.9 10*3/uL (ref 4.0–10.5)

## 2017-02-27 LAB — SURGICAL PCR SCREEN
MRSA, PCR: NEGATIVE
STAPHYLOCOCCUS AUREUS: NEGATIVE

## 2017-02-27 LAB — COMPREHENSIVE METABOLIC PANEL
ALK PHOS: 83 U/L (ref 38–126)
ALT: 16 U/L (ref 14–54)
AST: 18 U/L (ref 15–41)
Albumin: 4.4 g/dL (ref 3.5–5.0)
Anion gap: 4 — ABNORMAL LOW (ref 5–15)
BILIRUBIN TOTAL: 0.6 mg/dL (ref 0.3–1.2)
BUN: 20 mg/dL (ref 6–20)
CHLORIDE: 107 mmol/L (ref 101–111)
CO2: 26 mmol/L (ref 22–32)
Calcium: 9.5 mg/dL (ref 8.9–10.3)
Creatinine, Ser: 0.77 mg/dL (ref 0.44–1.00)
GFR calc Af Amer: >60 mL/min (ref >60)
Glucose, Bld: 104 mg/dL — ABNORMAL HIGH (ref 65–99)
Potassium: 4.2 mmol/L (ref 3.5–5.1)
Sodium: 137 mmol/L (ref 135–145)
TOTAL PROTEIN: 7.2 g/dL (ref 6.5–8.1)

## 2017-02-27 LAB — PROTIME-INR
INR: 0.91
PROTHROMBIN TIME: 12.2 s (ref 11.4–15.2)

## 2017-02-27 LAB — APTT: APTT: 31 s (ref 24–36)

## 2017-02-27 LAB — ABO/RH: ABO/RH(D): O POS

## 2017-02-28 NOTE — Pre-Procedure Instructions (Signed)
CXR results 02/27/17 faxed to Dr. Berenice Primas via epic.

## 2017-02-28 NOTE — Pre-Procedure Instructions (Signed)
UA results 02/27/17 faxed to Dr. Berenice Primas in epic.

## 2017-03-01 ENCOUNTER — Other Ambulatory Visit: Payer: Self-pay

## 2017-03-01 ENCOUNTER — Ambulatory Visit (INDEPENDENT_AMBULATORY_CARE_PROVIDER_SITE_OTHER): Payer: Medicare Other | Admitting: Internal Medicine

## 2017-03-01 ENCOUNTER — Encounter: Payer: Self-pay | Admitting: Internal Medicine

## 2017-03-01 VITALS — BP 134/76 | HR 93 | Temp 98.1°F | Wt 198.6 lb

## 2017-03-01 DIAGNOSIS — F341 Dysthymic disorder: Secondary | ICD-10-CM | POA: Diagnosis not present

## 2017-03-01 DIAGNOSIS — E785 Hyperlipidemia, unspecified: Secondary | ICD-10-CM | POA: Diagnosis not present

## 2017-03-01 DIAGNOSIS — G4700 Insomnia, unspecified: Secondary | ICD-10-CM

## 2017-03-01 DIAGNOSIS — Z1239 Encounter for other screening for malignant neoplasm of breast: Secondary | ICD-10-CM

## 2017-03-01 DIAGNOSIS — Z1231 Encounter for screening mammogram for malignant neoplasm of breast: Secondary | ICD-10-CM | POA: Diagnosis not present

## 2017-03-01 DIAGNOSIS — Z0000 Encounter for general adult medical examination without abnormal findings: Secondary | ICD-10-CM | POA: Diagnosis not present

## 2017-03-01 DIAGNOSIS — I10 Essential (primary) hypertension: Secondary | ICD-10-CM | POA: Diagnosis not present

## 2017-03-01 MED ORDER — ESCITALOPRAM OXALATE 5 MG PO TABS: 5.0000 mg | ORAL_TABLET | Freq: Every day | ORAL | 2 refills | Status: DC

## 2017-03-01 MED ORDER — LISINOPRIL 20 MG PO TABS: 20.0000 mg | ORAL_TABLET | Freq: Every day | ORAL | 3 refills | Status: DC

## 2017-03-01 MED ORDER — BUSPIRONE HCL 10 MG PO TABS: 10.0000 mg | ORAL_TABLET | Freq: Two times a day (BID) | ORAL | 3 refills | Status: DC

## 2017-03-01 MED ORDER — HYDROCHLOROTHIAZIDE 25 MG PO TABS: 25.0000 mg | ORAL_TABLET | Freq: Every day | ORAL | Status: DC

## 2017-03-01 MED ORDER — ATORVASTATIN CALCIUM 40 MG PO TABS: 40.0000 mg | ORAL_TABLET | Freq: Every day | ORAL | 3 refills | Status: DC

## 2017-03-01 MED ORDER — DICLOFENAC SODIUM 75 MG PO TBEC: DELAYED_RELEASE_TABLET | ORAL | 2 refills | Status: DC

## 2017-03-01 MED ORDER — AMLODIPINE BESYLATE 10 MG PO TABS: 10.0000 mg | ORAL_TABLET | Freq: Every day | ORAL | 3 refills | Status: DC

## 2017-03-01 MED ORDER — GABAPENTIN 300 MG PO CAPS: 300.0000 mg | ORAL_CAPSULE | Freq: Three times a day (TID) | ORAL | 3 refills | Status: DC

## 2017-03-01 NOTE — Patient Instructions (Signed)
I have changed you blood pressure medication. I want you to stop HCTZ and start Norvasc. Please follow up in 2 months

## 2017-03-01 NOTE — Progress Notes (Signed)
   [facility] Kerrin Mo, MD Phone: [phone]  Reason For Visit: Follow up   # CHRONIC HTN: Current Meds - Lisniopril, will stop HCTZ and start Norvasc Reports good compliance, took meds today. Tolerating well, w/o complaints. Denies CP, dyspnea, HA, edema, dizziness / lightheadedness  # HYPERLIPIDEMIA Disease Monitoring: See symptoms for Hypertension Medications: Atrovostatin  Right upper quadrant pain- none   Muscle aches- non  #Anxiety/Depression  -Currently on Busipar and Lexapro  -States she feels like overall her anxiety is very well controlled on BuSpar and Lexapro -However currently she is quite anxious a week before her knee replacement surgery   Past Medical History Reviewed problem list.  Medications- reviewed and updated No additions to family history Social history- patient is a  smoker  Objective: BP 134/76   Pulse 93   Temp 98.1 F (36.7 C) (Oral)   Wt 198 lb 9.6 oz (90.1 kg)   SpO2 96%   BMI 31.11 kg/m  Gen: NAD, alert, cooperative with exam Cardio: regular rate and rhythm, S1S2 heard, no murmurs appreciated Pulm: clear to auscultation bilaterally, no wheezes, rhonchi or rales GI: soft, non-tender, non-distended, bowel sounds present, no hepatomegaly, no splenomegaly Extremities: warm, well perfused, No edema, cyanosis or clubbing;  Skin: dry, intact, no rashes or lesions    Assessment/Plan: See problem based a/p  Healthcare maintenance Needs a mammogram Discussed with patient, have placed order  Hyperlipidemia Well controlled Continue atorvastatin  Essential hypertension, benign Patient complains about blood pressure medication causing her to have to have to urinate often, would like to try something that is not a diuretic -Will stop hydrochlorothiazide given patient with this urination issues -Continue lisinopril and start Norvasc

## 2017-03-05 NOTE — Assessment & Plan Note (Addendum)
Patient complains about blood pressure medication causing her to have to have to urinate often, would like to try something that is not a diuretic -Will stop hydrochlorothiazide given patient with this urination issues -Continue lisinopril and start Norvasc

## 2017-03-05 NOTE — Assessment & Plan Note (Signed)
Needs a mammogram Discussed with patient, have placed order

## 2017-03-05 NOTE — Assessment & Plan Note (Signed)
Doing well on BuSpar and Lexapro Continue these medications

## 2017-03-05 NOTE — Assessment & Plan Note (Signed)
Well controlled.  - Continue atorvastatin

## 2017-03-07 ENCOUNTER — Other Ambulatory Visit: Payer: Self-pay | Admitting: Orthopedic Surgery

## 2017-03-08 NOTE — H&P (Signed)
TOTAL KNEE ADMISSION H&P  Patient is being admitted for left total knee arthroplasty.  Subjective:  Chief Complaint:left knee pain.  HPI: Alexis Cole, 72 y.o. female, has a history of pain and functional disability in the left knee due to arthritis and has failed non-surgical conservative treatments for greater than 12 weeks to includeNSAID's and/or analgesics, corticosteriod injections, viscosupplementation injections, weight reduction as appropriate and activity modification.  Onset of symptoms was gradual, starting 4 years ago with gradually worsening course since that time. The patient noted no past surgery on the left knee(s).  Patient currently rates pain in the left knee(s) at 9 out of 10 with activity. Patient has night pain, worsening of pain with activity and weight bearing, pain that interferes with activities of daily living, pain with passive range of motion, crepitus and joint swelling.  Patient has evidence of subchondral sclerosis, periarticular osteophytes, joint subluxation and joint space narrowing by imaging studies. This patient has had failure of all raesonable conservative acre. There is no active infection.  Patient Active Problem List   Diagnosis Date Noted  . Increased endometrial stripe thickness 06/13/2016  . Lactic acidosis   . Nausea and vomiting   . General weakness 06/08/2016  . Hyperparathyroidism, primary ([place]) 04/27/2016  . Pre-syncope 04/12/2016  . Fatigue 02/24/2016  . Back pain 11/17/2015  . Insomnia 04/21/2015  . Hyperlipidemia 01/04/2015  . Breast mass in female 08/20/2014  . Estrogen deficiency 08/13/2014  . Acute recurrent sinusitis 08/13/2014  . Healthcare maintenance 06/24/2014  . Obesity 05/07/2014  . Tobacco use disorder 04/24/2014  . Chronic meniscal tear of knee 04/17/2014  . Seasonal allergies 01/03/2013  . Osteoarthritis of right hip 09/01/2011  . Essential hypertension, benign 03/14/2011  . ANXIETY DEPRESSION 02/10/2009  .  Arthropathy, lower leg 02/10/2009  . LOW BACK PAIN, CHRONIC 01/08/2008  . Arthritis 07/02/2007   Past Medical History:  Diagnosis Date  . Acne    but not being treated for  . Anemia    history of--many yrs ago  . Anxiety    but doesn't take any meds for this  . Aortic atherosclerosis ([place]) 06/08/2016   noted on CT abd/pelvis  . Arthritis   . Chronic back pain   . Complication of anesthesia    States she woke up just after parathyroid surgery while still in OR and attempted to get off the bed, remebers seeing staffs back towards her  . Depression   . Diarrhea   . Diverticulitis 06/08/2016   noted on CT abd/pelvis  . Diverticulosis 06/08/2016   noted on CT abd/pelvis  . Dyslipidemia   . Fatty liver 06/08/2016   severe noted on CT abd/pelvis   . Gastric ulcer    history of--many yrs ago  . Headache(784.0)    History of migraine  . Heart murmur   . History of bronchitis    last time 25month  . History of syncope   . Hx of migraines    last one a month ago  . Hypercalcemia 12/12/2016  . Hyperparathyroidism ([place] 12/12/2016  . Hypertension   . Insomnia    Nyquil nightly  . Joint pain   . Joint swelling   . Lactose intolerance   . Obesity   . Pneumonia 03/15/2016   left lower lobe, left lower lobe atelectasis, elevated left hemidiaphragm noted again, small left pleural effusion  . PTSD (post-traumatic stress disorder)   . Sinusitis   . Urge incontinence of urine     Past Surgical  History:  Procedure Laterality Date  . CATARACT EXTRACTION, BILATERAL    . CERVICAL CONE BIOPSY    . DILATION AND CURETTAGE OF UTERUS  30+yrs ago  . MOUTH SURGERY    . NASAL SEPTUM SURGERY  40+yrs ago  . PARATHYROIDECTOMY Left 01/04/2017   Procedure: LEFT SUPERIOR PARATHYROIDECTOMY;  Surgeon: Armandina Gemma, MD;  Location: [facility];  Service: General;  Laterality: Left;  . TOTAL HIP ARTHROPLASTY  09/01/2011   Procedure: TOTAL HIP ARTHROPLASTY;  Surgeon: [name], MD;  Location: [facility];   Service: Orthopedics;  Laterality: Right;  . TUBAL LIGATION  30+yrs ago    No current facility-administered medications for this encounter.    Current Outpatient Medications  Medication Sig Dispense Refill Last Dose  . BIOTIN PO Take 1 capsule by mouth daily.   01/03/2017 at Unknown time  . Cholecalciferol (VITAMIN D3) 1000 units CAPS Take 1,000 Units by mouth daily.   01/03/2017 at Unknown time  . diphenhydramine-acetaminophen (TYLENOL PM) 25-500 MG TABS tablet Take 1 tablet by mouth at bedtime as needed (for sleep).     . fluticasone (FLONASE) 50 MCG/ACT nasal spray Place 2 sprays into both nostrils daily. (Patient taking differently: Place 2 sprays into both nostrils daily as needed for allergies. ) 16 g 6   . folic acid (FOLVITE) 381 MCG tablet Take 400 mcg by mouth daily.   01/03/2017 at Unknown time  . Lidocaine 4 % PTCH Apply 1 patch topically daily as needed (for pain).     . Liniments (SALONPAS EX) Apply 1 application topically daily as needed (for pain).     . Multiple Vitamins-Minerals (HEALTHY EYES/LUTEIN PO) Take 1 capsule by mouth daily.   01/03/2017 at Unknown time  . Omega-3 Fatty Acids (FISH OIL PO) Take 1 capsule by mouth daily.   01/03/2017 at Unknown time  . oxymetazoline (AFRIN) 0.05 % nasal spray Place 1 spray into both nostrils 2 (two) times daily as needed for congestion.   01/04/2017 at 1000  . albuterol (PROVENTIL HFA;VENTOLIN HFA) 108 (90 Base) MCG/ACT inhaler Inhale 2 puffs into the lungs every 6 (six) hours as needed for wheezing or shortness of breath. (Patient not taking: Reported on 12/29/2016) 1 Inhaler 0 Not Taking at Unknown time  . amLODipine (NORVASC) 10 MG tablet Take 1 tablet (10 mg total) by mouth daily. 90 tablet 3   . atorvastatin (LIPITOR) 40 MG tablet Take 1 tablet (40 mg total) by mouth daily. 90 tablet 3   . busPIRone (BUSPAR) 10 MG tablet Take 1 tablet (10 mg total) by mouth 2 (two) times daily. 180 tablet 3   . diclofenac (VOLTAREN) 75 MG EC tablet  TAKE 1 TABLET (75 MG TOTAL) BY MOUTH 2 (TWO) TIMES DAILY. 60 tablet 2   . escitalopram (LEXAPRO) 5 MG tablet Take 1 tablet (5 mg total) by mouth daily. 60 tablet 2   . gabapentin (NEURONTIN) 300 MG capsule Take 1 capsule (300 mg total) by mouth 3 (three) times daily. 90 capsule 3   . hydrochlorothiazide (HYDRODIURIL) 25 MG tablet Take 1 tablet (25 mg total) by mouth daily. 90 tablet 03   . lisinopril (PRINIVIL,ZESTRIL) 20 MG tablet Take 1 tablet (20 mg total) by mouth daily. 90 tablet 3    Allergies  Allergen Reactions  . Tape Rash    Social History   Tobacco Use  . Smoking status: Current Every Day Smoker    Packs/day: 0.50    Years: 15.00    Pack years: 7.50  Types: Cigarettes    Start date: 02/21/1968  . Smokeless tobacco: Never Used  . Tobacco comment: quit in the past for 9 years.  Previous 1ppd  Substance Use Topics  . Alcohol use: No    Alcohol/week: 0.0 oz    Frequency: Never    Family History  Problem Relation Age of Onset  . Hypertension Mother   . Hyperlipidemia Neg Hx   . Heart attack Neg Hx   . Diabetes Neg Hx   . Sudden death Neg Hx      ROS ROS: I have reviewed the patient's review of systems thoroughly and there are no positive responses as relates to the HPI. Objective:  Physical Exam  Vital signs in last 24 hours:   Well-developed well-nourished patient in no acute distress. Alert and oriented x3 HEENT:within normal limits Cardiac: Regular rate and rhythm Pulmonary: Lungs clear to auscultation Abdomen: Soft and nontender.  Normal active bowel sounds  Musculoskeletal: (l kneee: painful rom limited rom NVI distally moderate varus allignment) Labs: Recent Results (from the past 2160 hour(s))  Fecal occult blood, imunochemical     Status: None   Collection Time: 12/14/16  3:34 PM  Result Value Ref Range   Fecal Occult Bld Negative Negative  Basic metabolic panel     Status: Abnormal   Collection Time: 12/29/16 12:08 PM  Result Value Ref Range    Sodium 140 135 - 145 mmol/L   Potassium 4.7 3.5 - 5.1 mmol/L   Chloride 107 101 - 111 mmol/L   CO2 26 22 - 32 mmol/L   Glucose, Bld 101 (H) 65 - 99 mg/dL   BUN 16 6 - 20 mg/dL   Creatinine, Ser 0.88 0.44 - 1.00 mg/dL   Calcium 10.2 8.9 - 10.3 mg/dL   GFR calc non Af Amer >60 >60 mL/min   GFR calc Af Amer >60 >60 mL/min    Comment: (NOTE) The eGFR has been calculated using the CKD EPI equation. This calculation has not been validated in all clinical situations. eGFR's persistently <60 mL/min signify possible Chronic Kidney Disease.    Anion gap 7 5 - 15  CBC     Status: Abnormal   Collection Time: 12/29/16 12:08 PM  Result Value Ref Range   WBC 13.9 (H) 4.0 - 10.5 K/uL   RBC 4.43 3.87 - 5.11 MIL/uL   Hemoglobin 13.7 12.0 - 15.0 g/dL   HCT 41.3 36.0 - 46.0 %   MCV 93.2 78.0 - 100.0 fL   MCH 30.9 26.0 - 34.0 pg   MCHC 33.2 30.0 - 36.0 g/dL   RDW 14.3 11.5 - 15.5 %   Platelets 206 150 - 400 K/uL  Surgical pcr screen     Status: None   Collection Time: 02/27/17  1:30 PM  Result Value Ref Range   MRSA, PCR NEGATIVE NEGATIVE   Staphylococcus aureus NEGATIVE NEGATIVE    Comment: (NOTE) The Xpert SA Assay (FDA approved for NASAL specimens in patients 32 years of age and older), is one component of a comprehensive surveillance program. It is not intended to diagnose infection nor to guide or monitor treatment.   Urinalysis, Routine w reflex microscopic     Status: Abnormal   Collection Time: 02/27/17  1:40 PM  Result Value Ref Range   Color, Urine STRAW (A) YELLOW   APPearance CLEAR CLEAR   Specific Gravity, Urine 1.005 1.005 - 1.030   pH 6.0 5.0 - 8.0   Glucose, UA NEGATIVE NEGATIVE mg/dL  Hgb urine dipstick SMALL (A) NEGATIVE   Bilirubin Urine NEGATIVE NEGATIVE   Ketones, ur NEGATIVE NEGATIVE mg/dL   Protein, ur NEGATIVE NEGATIVE mg/dL   Nitrite NEGATIVE NEGATIVE   Leukocytes, UA NEGATIVE NEGATIVE   RBC / HPF 0-5 0 - 5 RBC/hpf   WBC, UA 0-5 0 - 5 WBC/hpf    Bacteria, UA NONE SEEN NONE SEEN   Squamous Epithelial / LPF 0-5 (A) NONE SEEN  APTT     Status: None   Collection Time: 02/27/17  2:07 PM  Result Value Ref Range   aPTT 31 24 - 36 seconds  CBC WITH DIFFERENTIAL     Status: None   Collection Time: 02/27/17  2:07 PM  Result Value Ref Range   WBC 9.9 4.0 - 10.5 K/uL   RBC 4.67 3.87 - 5.11 MIL/uL   Hemoglobin 14.3 12.0 - 15.0 g/dL   HCT 43.2 36.0 - 46.0 %   MCV 92.5 78.0 - 100.0 fL   MCH 30.6 26.0 - 34.0 pg   MCHC 33.1 30.0 - 36.0 g/dL   RDW 13.4 11.5 - 15.5 %   Platelets 211 150 - 400 K/uL   Neutrophils Relative % 52 %   Neutro Abs 5.1 1.7 - 7.7 K/uL   Lymphocytes Relative 38 %   Lymphs Abs 3.8 0.7 - 4.0 K/uL   Monocytes Relative 8 %   Monocytes Absolute 0.8 0.1 - 1.0 K/uL   Eosinophils Relative 1 %   Eosinophils Absolute 0.1 0.0 - 0.7 K/uL   Basophils Relative 1 %   Basophils Absolute 0.1 0.0 - 0.1 K/uL  Comprehensive metabolic panel     Status: Abnormal   Collection Time: 02/27/17  2:07 PM  Result Value Ref Range   Sodium 137 135 - 145 mmol/L   Potassium 4.2 3.5 - 5.1 mmol/L   Chloride 107 101 - 111 mmol/L   CO2 26 22 - 32 mmol/L   Glucose, Bld 104 (H) 65 - 99 mg/dL   BUN 20 6 - 20 mg/dL   Creatinine, Ser 0.77 0.44 - 1.00 mg/dL   Calcium 9.5 8.9 - 10.3 mg/dL   Total Protein 7.2 6.5 - 8.1 g/dL   Albumin 4.4 3.5 - 5.0 g/dL   AST 18 15 - 41 U/L   ALT 16 14 - 54 U/L   Alkaline Phosphatase 83 38 - 126 U/L   Total Bilirubin 0.6 0.3 - 1.2 mg/dL   GFR calc non Af Amer >60 >60 mL/min   GFR calc Af Amer >60 >60 mL/min    Comment: (NOTE) The eGFR has been calculated using the CKD EPI equation. This calculation has not been validated in all clinical situations. eGFR's persistently <60 mL/min signify possible Chronic Kidney Disease.    Anion gap 4 (L) 5 - 15  Protime-INR     Status: None   Collection Time: 02/27/17  2:07 PM  Result Value Ref Range   Prothrombin Time 12.2 11.4 - 15.2 seconds   INR 0.91   Type and screen  Order type and screen if day of surgery is less than 15 days from draw of preadmission visit or order morning of surgery if day of surgery is greater than 6 days from preadmission visit.     Status: None   Collection Time: 02/27/17  2:07 PM  Result Value Ref Range   ABO/RH(D) O POS    Antibody Screen NEG    Sample Expiration 03/13/2017    Extend sample reason NO TRANSFUSIONS OR PREGNANCY  IN THE PAST 3 MONTHS   ABO/Rh     Status: None   Collection Time: 02/27/17  2:07 PM  Result Value Ref Range   ABO/RH(D) O POS    Estimated body mass index is 31.11 kg/m as calculated from the following:   Height as of 02/27/17: '5\' 7"'  (1.702 m).   Weight as of 03/01/17: 90.1 kg (198 lb 9.6 oz).   Imaging Review Plain radiographs demonstrate severe degenerative joint disease of the left knee(s). The overall alignment ismild varus. The bone quality appears to be fair for age and reported activity level.  Assessment/Plan:  End stage arthritis, left knee   The patient history, physical examination, clinical judgment of the provider and imaging studies are consistent with end stage degenerative joint disease of the left knee(s) and total knee arthroplasty is deemed medically necessary. The treatment options including medical management, injection therapy arthroscopy and arthroplasty were discussed at length. The risks and benefits of total knee arthroplasty were presented and reviewed. The risks due to aseptic loosening, infection, stiffness, patella tracking problems, thromboembolic complications and other imponderables were discussed. The patient acknowledged the explanation, agreed to proceed with the plan and consent was signed. Patient is being admitted for inpatient treatment for surgery, pain control, PT, OT, prophylactic antibiotics, VTE prophylaxis, progressive ambulation and ADL's and discharge planning. The patient is planning to be discharged home with home health services

## 2017-03-09 ENCOUNTER — Encounter (HOSPITAL_COMMUNITY)
Admission: RE | Disposition: A | Discharge status: Skilled Nursing Facility | Payer: Self-pay | Source: Ambulatory Visit | Attending: Orthopedic Surgery

## 2017-03-09 ENCOUNTER — Other Ambulatory Visit: Payer: Self-pay

## 2017-03-09 ENCOUNTER — Inpatient Hospital Stay (HOSPITAL_COMMUNITY)
Admission: RE | Admit: 2017-03-09 | Discharge: 2017-03-13 | DRG: 470 | Disposition: A | Discharge status: Skilled Nursing Facility | Payer: Medicare Other | Source: Ambulatory Visit | Attending: Orthopedic Surgery | Admitting: Orthopedic Surgery

## 2017-03-09 ENCOUNTER — Inpatient Hospital Stay (HOSPITAL_COMMUNITY): Payer: Medicare Other | Admitting: Registered Nurse

## 2017-03-09 ENCOUNTER — Encounter (HOSPITAL_COMMUNITY): Payer: Self-pay

## 2017-03-09 DIAGNOSIS — E785 Hyperlipidemia, unspecified: Secondary | ICD-10-CM | POA: Diagnosis not present

## 2017-03-09 DIAGNOSIS — I10 Essential (primary) hypertension: Secondary | ICD-10-CM | POA: Diagnosis not present

## 2017-03-09 DIAGNOSIS — Z96652 Presence of left artificial knee joint: Secondary | ICD-10-CM | POA: Diagnosis not present

## 2017-03-09 DIAGNOSIS — E739 Lactose intolerance, unspecified: Secondary | ICD-10-CM | POA: Diagnosis present

## 2017-03-09 DIAGNOSIS — M25562 Pain in left knee: Secondary | ICD-10-CM | POA: Diagnosis present

## 2017-03-09 DIAGNOSIS — K760 Fatty (change of) liver, not elsewhere classified: Secondary | ICD-10-CM | POA: Diagnosis not present

## 2017-03-09 DIAGNOSIS — Z96641 Presence of right artificial hip joint: Secondary | ICD-10-CM | POA: Diagnosis present

## 2017-03-09 DIAGNOSIS — G8918 Other acute postprocedural pain: Secondary | ICD-10-CM | POA: Diagnosis not present

## 2017-03-09 DIAGNOSIS — F17210 Nicotine dependence, cigarettes, uncomplicated: Secondary | ICD-10-CM | POA: Diagnosis present

## 2017-03-09 DIAGNOSIS — G8911 Acute pain due to trauma: Secondary | ICD-10-CM | POA: Diagnosis not present

## 2017-03-09 DIAGNOSIS — M1712 Unilateral primary osteoarthritis, left knee: Secondary | ICD-10-CM | POA: Diagnosis not present

## 2017-03-09 DIAGNOSIS — S8990XA Unspecified injury of unspecified lower leg, initial encounter: Secondary | ICD-10-CM | POA: Diagnosis not present

## 2017-03-09 DIAGNOSIS — F4310 Post-traumatic stress disorder, unspecified: Secondary | ICD-10-CM | POA: Diagnosis present

## 2017-03-09 DIAGNOSIS — E892 Postprocedural hypoparathyroidism: Secondary | ICD-10-CM | POA: Diagnosis present

## 2017-03-09 DIAGNOSIS — E210 Primary hyperparathyroidism: Secondary | ICD-10-CM | POA: Diagnosis not present

## 2017-03-09 HISTORY — PX: TOTAL KNEE ARTHROPLASTY: SHX125

## 2017-03-09 LAB — TYPE AND SCREEN
ABO/RH(D): O POS
Antibody Screen: NEGATIVE

## 2017-03-09 SURGERY — ARTHROPLASTY, KNEE, TOTAL
Anesthesia: Spinal | Site: Knee | Laterality: Left

## 2017-03-09 MED ORDER — STERILE WATER FOR IRRIGATION IR SOLN
Status: DC | PRN
  Administered 2017-03-09: 2000 mL

## 2017-03-09 MED ORDER — DEXAMETHASONE SODIUM PHOSPHATE 10 MG/ML IJ SOLN
10.0000 mg | Freq: Two times a day (BID) | INTRAMUSCULAR | Status: AC
  Administered 2017-03-09: 22:00:00 10 mg via INTRAVENOUS
  Filled 2017-03-09 (×2): qty 1

## 2017-03-09 MED ORDER — PROPOFOL 10 MG/ML IV BOLUS
INTRAVENOUS | Status: AC
  Filled 2017-03-09: qty 60

## 2017-03-09 MED ORDER — FENTANYL CITRATE (PF) 100 MCG/2ML IJ SOLN
INTRAMUSCULAR | Status: DC | PRN
  Administered 2017-03-09: 100 ug via INTRAVENOUS

## 2017-03-09 MED ORDER — ONDANSETRON HCL 4 MG/2ML IJ SOLN: 4.0000 mg | Freq: Once | INTRAMUSCULAR | Status: DC | PRN

## 2017-03-09 MED ORDER — DOCUSATE SODIUM 100 MG PO CAPS
100.0000 mg | ORAL_CAPSULE | Freq: Two times a day (BID) | ORAL | Status: DC
  Administered 2017-03-09 – 2017-03-13 (×8): 100 mg via ORAL
  Filled 2017-03-09 (×8): qty 1

## 2017-03-09 MED ORDER — PHENYLEPHRINE HCL 10 MG/ML IJ SOLN
INTRAVENOUS | Status: DC | PRN
  Administered 2017-03-09: 50 ug/min via INTRAVENOUS

## 2017-03-09 MED ORDER — CEFUROXIME SODIUM 1.5 G IV SOLR
INTRAVENOUS | Status: AC
  Filled 2017-03-09: qty 1.5

## 2017-03-09 MED ORDER — BUSPIRONE HCL 5 MG PO TABS
10.0000 mg | ORAL_TABLET | Freq: Two times a day (BID) | ORAL | Status: DC
  Administered 2017-03-09 – 2017-03-13 (×8): 10 mg via ORAL
  Filled 2017-03-09 (×8): qty 2

## 2017-03-09 MED ORDER — MIDAZOLAM HCL 5 MG/5ML IJ SOLN
INTRAMUSCULAR | Status: DC | PRN
  Administered 2017-03-09: 2 mg via INTRAVENOUS
  Administered 2017-03-09: 1 mg via INTRAVENOUS

## 2017-03-09 MED ORDER — ESCITALOPRAM OXALATE 10 MG PO TABS
5.0000 mg | ORAL_TABLET | Freq: Every day | ORAL | Status: DC
  Administered 2017-03-10 – 2017-03-13 (×4): 5 mg via ORAL
  Filled 2017-03-09 (×4): qty 1

## 2017-03-09 MED ORDER — ONDANSETRON HCL 4 MG/2ML IJ SOLN
INTRAMUSCULAR | Status: AC
  Filled 2017-03-09: qty 2

## 2017-03-09 MED ORDER — ACETAMINOPHEN 325 MG PO TABS
650.0000 mg | ORAL_TABLET | ORAL | Status: DC | PRN
Start: 2017-03-09 — End: 2017-03-13

## 2017-03-09 MED ORDER — DEXAMETHASONE SODIUM PHOSPHATE 10 MG/ML IJ SOLN
INTRAMUSCULAR | Status: DC | PRN
  Administered 2017-03-09: 10 mg via INTRAVENOUS

## 2017-03-09 MED ORDER — HYDROMORPHONE HCL 1 MG/ML IJ SOLN
0.5000 mg | INTRAMUSCULAR | Status: DC | PRN
  Administered 2017-03-09: 1 mg via INTRAVENOUS

## 2017-03-09 MED ORDER — ACETAMINOPHEN 650 MG RE SUPP
650.0000 mg | RECTAL | Status: DC | PRN
Start: 2017-03-09 — End: 2017-03-13

## 2017-03-09 MED ORDER — BUPIVACAINE LIPOSOME 1.3 % IJ SUSP
INTRAMUSCULAR | Status: DC | PRN
  Administered 2017-03-09: 20 mL

## 2017-03-09 MED ORDER — OXYCODONE HCL 5 MG PO TABS
5.0000 mg | ORAL_TABLET | ORAL | Status: DC | PRN
  Administered 2017-03-09 – 2017-03-13 (×23): 10 mg via ORAL
  Administered 2017-03-13 (×2): 5 mg via ORAL
  Administered 2017-03-13: 09:00:00 10 mg via ORAL
  Administered 2017-03-13: 15:00:00 5 mg via ORAL
  Filled 2017-03-09 (×9): qty 2
  Filled 2017-03-09 (×2): qty 1
  Filled 2017-03-09: qty 2
  Filled 2017-03-09: qty 1
  Filled 2017-03-09 (×10): qty 2
  Filled 2017-03-09: qty 1
  Filled 2017-03-09 (×2): qty 2
  Filled 2017-03-09: qty 1
  Filled 2017-03-09 (×3): qty 2

## 2017-03-09 MED ORDER — HYDROMORPHONE HCL 1 MG/ML IJ SOLN: 0.2500 mg | INTRAMUSCULAR | Status: DC | PRN

## 2017-03-09 MED ORDER — ROPIVACAINE HCL 7.5 MG/ML IJ SOLN
INTRAMUSCULAR | Status: DC | PRN
  Administered 2017-03-09: 20 mL via PERINEURAL

## 2017-03-09 MED ORDER — DIPHENHYDRAMINE HCL 12.5 MG/5ML PO ELIX: 12.5000 mg | ORAL_SOLUTION | ORAL | Status: DC | PRN

## 2017-03-09 MED ORDER — HYDROMORPHONE HCL 1 MG/ML IJ SOLN
INTRAMUSCULAR | Status: AC
  Filled 2017-03-09: qty 2

## 2017-03-09 MED ORDER — BUPIVACAINE IN DEXTROSE 0.75-8.25 % IT SOLN
INTRATHECAL | Status: DC | PRN
  Administered 2017-03-09: 2 mL via INTRATHECAL

## 2017-03-09 MED ORDER — CHLORHEXIDINE GLUCONATE 4 % EX LIQD: 60.0000 mL | Freq: Once | CUTANEOUS | Status: DC

## 2017-03-09 MED ORDER — SODIUM CHLORIDE 0.9 % IJ SOLN
INTRAMUSCULAR | Status: AC
  Filled 2017-03-09: qty 50

## 2017-03-09 MED ORDER — DEXAMETHASONE SODIUM PHOSPHATE 10 MG/ML IJ SOLN
INTRAMUSCULAR | Status: AC
  Filled 2017-03-09: qty 1

## 2017-03-09 MED ORDER — PHENYLEPHRINE HCL 10 MG/ML IJ SOLN: INTRAVENOUS | Status: DC | PRN

## 2017-03-09 MED ORDER — OXYCODONE-ACETAMINOPHEN 5-325 MG PO TABS: 1.0000 | ORAL_TABLET | Freq: Four times a day (QID) | ORAL | 0 refills | Status: DC | PRN

## 2017-03-09 MED ORDER — MEPERIDINE HCL 50 MG/ML IJ SOLN: 6.2500 mg | INTRAMUSCULAR | Status: DC | PRN

## 2017-03-09 MED ORDER — SODIUM CHLORIDE 0.9 % IR SOLN
Status: DC | PRN
  Administered 2017-03-09 (×2): 1000 mL

## 2017-03-09 MED ORDER — ASPIRIN EC 325 MG PO TBEC: 325.0000 mg | DELAYED_RELEASE_TABLET | Freq: Two times a day (BID) | ORAL | 0 refills | Status: DC

## 2017-03-09 MED ORDER — MAGNESIUM CITRATE PO SOLN: 1.0000 | Freq: Once | ORAL | Status: DC | PRN

## 2017-03-09 MED ORDER — ONDANSETRON HCL 4 MG/2ML IJ SOLN: 4.0000 mg | Freq: Four times a day (QID) | INTRAMUSCULAR | Status: DC | PRN

## 2017-03-09 MED ORDER — FENTANYL CITRATE (PF) 100 MCG/2ML IJ SOLN
INTRAMUSCULAR | Status: AC
  Filled 2017-03-09: qty 2

## 2017-03-09 MED ORDER — LACTATED RINGERS IV SOLN
INTRAVENOUS | Status: DC | PRN
  Administered 2017-03-09 (×3): via INTRAVENOUS

## 2017-03-09 MED ORDER — MIDAZOLAM HCL 2 MG/2ML IJ SOLN
INTRAMUSCULAR | Status: AC
  Filled 2017-03-09: qty 2

## 2017-03-09 MED ORDER — SODIUM CHLORIDE 0.9 % IV SOLN
INTRAVENOUS | Status: DC
  Administered 2017-03-09 (×2): via INTRAVENOUS

## 2017-03-09 MED ORDER — CEFAZOLIN SODIUM-DEXTROSE 2-4 GM/100ML-% IV SOLN
2.0000 g | Freq: Four times a day (QID) | INTRAVENOUS | Status: AC
  Administered 2017-03-09 (×2): 2 g via INTRAVENOUS
  Filled 2017-03-09 (×2): qty 100

## 2017-03-09 MED ORDER — CEFUROXIME SODIUM 1.5 G IV SOLR
INTRAVENOUS | Status: DC | PRN
  Administered 2017-03-09: 1.5 g via INTRAVENOUS

## 2017-03-09 MED ORDER — CELECOXIB 200 MG PO CAPS
200.0000 mg | ORAL_CAPSULE | Freq: Two times a day (BID) | ORAL | Status: DC
  Administered 2017-03-09 – 2017-03-13 (×8): 200 mg via ORAL
  Filled 2017-03-09 (×8): qty 1

## 2017-03-09 MED ORDER — TRANEXAMIC ACID 1000 MG/10ML IV SOLN
1000.0000 mg | INTRAVENOUS | Status: AC
  Administered 2017-03-09: 1000 mg via INTRAVENOUS
  Filled 2017-03-09: qty 1100

## 2017-03-09 MED ORDER — ASPIRIN EC 325 MG PO TBEC
325.0000 mg | DELAYED_RELEASE_TABLET | Freq: Two times a day (BID) | ORAL | Status: DC
  Administered 2017-03-09 – 2017-03-13 (×8): 325 mg via ORAL
  Filled 2017-03-09 (×8): qty 1

## 2017-03-09 MED ORDER — PHENYLEPHRINE HCL 10 MG/ML IJ SOLN
INTRAMUSCULAR | Status: DC | PRN
  Administered 2017-03-09 (×3): 120 ug via INTRAVENOUS
  Administered 2017-03-09: 40 ug via INTRAVENOUS

## 2017-03-09 MED ORDER — ONDANSETRON HCL 4 MG/2ML IJ SOLN
INTRAMUSCULAR | Status: DC | PRN
  Administered 2017-03-09: 4 mg via INTRAVENOUS

## 2017-03-09 MED ORDER — AMLODIPINE BESYLATE 10 MG PO TABS
10.0000 mg | ORAL_TABLET | Freq: Every day | ORAL | Status: DC
  Administered 2017-03-09 – 2017-03-13 (×4): 10 mg via ORAL
  Filled 2017-03-09 (×5): qty 1

## 2017-03-09 MED ORDER — PROPOFOL 500 MG/50ML IV EMUL
INTRAVENOUS | Status: DC | PRN
  Administered 2017-03-09: 100 ug/kg/min via INTRAVENOUS

## 2017-03-09 MED ORDER — LISINOPRIL 20 MG PO TABS
20.0000 mg | ORAL_TABLET | Freq: Every day | ORAL | Status: DC
  Administered 2017-03-09 – 2017-03-13 (×4): 20 mg via ORAL
  Filled 2017-03-09 (×5): qty 1

## 2017-03-09 MED ORDER — BUPIVACAINE LIPOSOME 1.3 % IJ SUSP
20.0000 mL | Freq: Once | INTRAMUSCULAR | Status: DC
  Filled 2017-03-09: qty 20

## 2017-03-09 MED ORDER — 0.9 % SODIUM CHLORIDE (POUR BTL) OPTIME
TOPICAL | Status: DC | PRN
  Administered 2017-03-09: 1000 mL

## 2017-03-09 MED ORDER — BUPIVACAINE-EPINEPHRINE (PF) 0.5% -1:200000 IJ SOLN
INTRAMUSCULAR | Status: AC
  Filled 2017-03-09: qty 30

## 2017-03-09 MED ORDER — HYDROCHLOROTHIAZIDE 25 MG PO TABS
25.0000 mg | ORAL_TABLET | Freq: Every day | ORAL | Status: DC
  Administered 2017-03-09 – 2017-03-13 (×4): 25 mg via ORAL
  Filled 2017-03-09 (×5): qty 1

## 2017-03-09 MED ORDER — TIZANIDINE HCL 2 MG PO TABS: 2.0000 mg | ORAL_TABLET | Freq: Three times a day (TID) | ORAL | 0 refills | Status: DC | PRN

## 2017-03-09 MED ORDER — PROPOFOL 10 MG/ML IV BOLUS
INTRAVENOUS | Status: AC
  Filled 2017-03-09: qty 20

## 2017-03-09 MED ORDER — ONDANSETRON HCL 4 MG PO TABS: 4.0000 mg | ORAL_TABLET | Freq: Four times a day (QID) | ORAL | Status: DC | PRN

## 2017-03-09 MED ORDER — PROPOFOL 10 MG/ML IV BOLUS
INTRAVENOUS | Status: DC | PRN
  Administered 2017-03-09: 40 mg via INTRAVENOUS

## 2017-03-09 MED ORDER — BISACODYL 5 MG PO TBEC: 5.0000 mg | DELAYED_RELEASE_TABLET | Freq: Every day | ORAL | Status: DC | PRN

## 2017-03-09 MED ORDER — LIP MEDEX EX OINT
TOPICAL_OINTMENT | CUTANEOUS | Status: AC
  Administered 2017-03-09: 11:00:00
  Filled 2017-03-09: qty 7

## 2017-03-09 MED ORDER — CEFAZOLIN SODIUM-DEXTROSE 2-4 GM/100ML-% IV SOLN
2.0000 g | INTRAVENOUS | Status: AC
  Administered 2017-03-09: 2 g via INTRAVENOUS
  Filled 2017-03-09: qty 100

## 2017-03-09 MED ORDER — POLYETHYLENE GLYCOL 3350 17 G PO PACK
17.0000 g | PACK | Freq: Every day | ORAL | Status: DC | PRN
  Administered 2017-03-13: 17 g via ORAL
  Filled 2017-03-09: qty 1

## 2017-03-09 MED ORDER — DOCUSATE SODIUM 100 MG PO CAPS: 100.0000 mg | ORAL_CAPSULE | Freq: Two times a day (BID) | ORAL | 0 refills | Status: DC

## 2017-03-09 MED ORDER — BUPIVACAINE-EPINEPHRINE 0.5% -1:200000 IJ SOLN
INTRAMUSCULAR | Status: DC | PRN
  Administered 2017-03-09: 30 mL

## 2017-03-09 MED ORDER — NICOTINE 14 MG/24HR TD PT24
14.0000 mg | MEDICATED_PATCH | Freq: Every day | TRANSDERMAL | Status: DC
  Administered 2017-03-09 – 2017-03-13 (×5): 14 mg via TRANSDERMAL
  Filled 2017-03-09 (×6): qty 1

## 2017-03-09 MED ORDER — GABAPENTIN 300 MG PO CAPS
300.0000 mg | ORAL_CAPSULE | Freq: Three times a day (TID) | ORAL | Status: DC
  Administered 2017-03-09 – 2017-03-13 (×12): 300 mg via ORAL
  Filled 2017-03-09 (×12): qty 1

## 2017-03-09 MED ORDER — SODIUM CHLORIDE 0.9 % IJ SOLN
INTRAMUSCULAR | Status: DC | PRN
  Administered 2017-03-09: 30 mL

## 2017-03-09 MED ORDER — ALUM & MAG HYDROXIDE-SIMETH 200-200-20 MG/5ML PO SUSP: 30.0000 mL | ORAL | Status: DC | PRN

## 2017-03-09 MED ORDER — TRANEXAMIC ACID 1000 MG/10ML IV SOLN
1000.0000 mg | Freq: Once | INTRAVENOUS | Status: AC
  Administered 2017-03-09: 1000 mg via INTRAVENOUS
  Filled 2017-03-09: qty 1100

## 2017-03-09 MED ORDER — METHOCARBAMOL 1000 MG/10ML IJ SOLN
500.0000 mg | Freq: Four times a day (QID) | INTRAMUSCULAR | Status: DC | PRN
  Administered 2017-03-09: 500 mg via INTRAVENOUS
  Filled 2017-03-09: qty 550

## 2017-03-09 MED ORDER — METHOCARBAMOL 500 MG PO TABS
500.0000 mg | ORAL_TABLET | Freq: Four times a day (QID) | ORAL | Status: DC | PRN
  Administered 2017-03-10 – 2017-03-13 (×11): 500 mg via ORAL
  Filled 2017-03-09 (×11): qty 1

## 2017-03-09 MED ORDER — ATORVASTATIN CALCIUM 40 MG PO TABS
40.0000 mg | ORAL_TABLET | Freq: Every day | ORAL | Status: DC
  Administered 2017-03-10 – 2017-03-13 (×4): 40 mg via ORAL
  Filled 2017-03-09 (×4): qty 1

## 2017-03-09 SURGICAL SUPPLY — 53 items
BAG ZIPLOCK 12X15 (MISCELLANEOUS) ×3 IMPLANT
BANDAGE ACE 4X5 VEL STRL LF (GAUZE/BANDAGES/DRESSINGS) ×3 IMPLANT
BANDAGE ACE 6X5 VEL STRL LF (GAUZE/BANDAGES/DRESSINGS) ×3 IMPLANT
BENZOIN TINCTURE PRP APPL 2/3 (GAUZE/BANDAGES/DRESSINGS) ×3 IMPLANT
BLADE SAG 18X100X1.27 (BLADE) ×3 IMPLANT
BLADE SAW SGTL 13.0X1.19X90.0M (BLADE) ×3 IMPLANT
BOOTIES KNEE HIGH SLOAN (MISCELLANEOUS) ×6 IMPLANT
BOWL SMART MIX CTS (DISPOSABLE) ×3 IMPLANT
CAPT KNEE TOTAL 3 ATTUNE ×3 IMPLANT
CEMENT HV SMART SET (Cement) ×6 IMPLANT
CLOSURE STERI-STRIP 1/4X4 (GAUZE/BANDAGES/DRESSINGS) ×3 IMPLANT
CLOSURE WOUND 1/2 X4 (GAUZE/BANDAGES/DRESSINGS) ×1
CUFF TOURN SGL QUICK 34 (TOURNIQUET CUFF) ×2
CUFF TRNQT CYL 34X4X40X1 (TOURNIQUET CUFF) ×1 IMPLANT
DRAPE U-SHAPE 47X51 STRL (DRAPES) ×3 IMPLANT
DRSG ADAPTIC 3X8 NADH LF (GAUZE/BANDAGES/DRESSINGS) ×3 IMPLANT
DRSG AQUACEL AG ADV 3.5X10 (GAUZE/BANDAGES/DRESSINGS) ×3 IMPLANT
DRSG MEPILEX BORDER 4X8 (GAUZE/BANDAGES/DRESSINGS) ×3 IMPLANT
DRSG PAD ABDOMINAL 8X10 ST (GAUZE/BANDAGES/DRESSINGS) ×3 IMPLANT
DURAPREP 26ML APPLICATOR (WOUND CARE) ×6 IMPLANT
ELECT REM PT RETURN 15FT ADLT (MISCELLANEOUS) ×3 IMPLANT
GAUZE SPONGE 4X4 12PLY STRL (GAUZE/BANDAGES/DRESSINGS) ×3 IMPLANT
GLOVE BIOGEL PI IND STRL 8 (GLOVE) ×2 IMPLANT
GLOVE BIOGEL PI INDICATOR 8 (GLOVE) ×4
GLOVE ECLIPSE 7.5 STRL STRAW (GLOVE) ×6 IMPLANT
GOWN STRL REUS W/TWL XL LVL3 (GOWN DISPOSABLE) ×6 IMPLANT
HANDPIECE INTERPULSE COAX TIP (DISPOSABLE) ×2
HOOD PEEL AWAY FLYTE STAYCOOL (MISCELLANEOUS) ×9 IMPLANT
IMMOBILIZER KNEE 20 (SOFTGOODS) ×6 IMPLANT
IMMOBILIZER KNEE 20 THIGH 36 (SOFTGOODS) ×1 IMPLANT
MANIFOLD NEPTUNE II (INSTRUMENTS) ×3 IMPLANT
NEEDLE HYPO 21X1.5 SAFETY (NEEDLE) ×6 IMPLANT
NS IRRIG 1000ML POUR BTL (IV SOLUTION) ×3 IMPLANT
PACK ICE MAXI GEL EZY WRAP (MISCELLANEOUS) ×3 IMPLANT
PACK TOTAL KNEE CUSTOM (KITS) ×3 IMPLANT
PAD ABD 8X10 STRL (GAUZE/BANDAGES/DRESSINGS) ×3 IMPLANT
PADDING CAST COTTON 6X4 STRL (CAST SUPPLIES) ×3 IMPLANT
POSITIONER SURGICAL ARM (MISCELLANEOUS) ×3 IMPLANT
SET HNDPC FAN SPRY TIP SCT (DISPOSABLE) ×1 IMPLANT
STAPLER VISISTAT 35W (STAPLE) ×3 IMPLANT
STRIP CLOSURE SKIN 1/2X4 (GAUZE/BANDAGES/DRESSINGS) ×2 IMPLANT
SUT MNCRL AB 3-0 PS2 18 (SUTURE) ×3 IMPLANT
SUT VIC AB 0 CT1 36 (SUTURE) ×3 IMPLANT
SUT VIC AB 1 CT1 36 (SUTURE) ×6 IMPLANT
SUT VIC AB 2-0 CT1 27 (SUTURE) ×2
SUT VIC AB 2-0 CT1 TAPERPNT 27 (SUTURE) ×1 IMPLANT
SWABSTK COMLB BENZOIN TINCTURE (MISCELLANEOUS) ×3 IMPLANT
SYRINGE 10CC LL (SYRINGE) ×6 IMPLANT
TOWEL OR NON WOVEN STRL DISP B (DISPOSABLE) ×3 IMPLANT
TRAY FOLEY BAG SILVER LF 16FR (CATHETERS) ×3 IMPLANT
WATER STERILE IRR 1000ML POUR (IV SOLUTION) ×3 IMPLANT
WRAP KNEE MAXI GEL POST OP (GAUZE/BANDAGES/DRESSINGS) ×3 IMPLANT
YANKAUER SUCT BULB TIP 10FT TU (MISCELLANEOUS) ×3 IMPLANT

## 2017-03-09 NOTE — Brief Op Note (Signed)
03/09/2017  9:16 AM  PATIENT:  Alexis Cole  72 y.o. female  PRE-OPERATIVE DIAGNOSIS:  OSTEOARTHRITIS LEFT KNEE  POST-OPERATIVE DIAGNOSIS:  OSTEOARTHRITIS LEFT KNEE  PROCEDURE:  Procedure(s): LEFT TOTAL KNEE ARTHROPLASTY (Left)  SURGEON:  Surgeon(s) and Role:    Dorna Leitz, MD - Primary  PHYSICIAN ASSISTANT:   ASSISTANTS: bethune   ANESTHESIA:   spinal  EBL:  50 mL   BLOOD ADMINISTERED:none  DRAINS: none   LOCAL MEDICATIONS USED:  MARCAINE    and OTHER experel  SPECIMEN:  No Specimen  DISPOSITION OF SPECIMEN:  N/A  COUNTS:  YES  TOURNIQUET:   Total Tourniquet Time Documented: Thigh (Left) - 61 minutes Total: Thigh (Left) - 61 minutes   DICTATION: .Other Dictation: Dictation Number [id number]  PLAN OF CARE: Admit to inpatient   PATIENT DISPOSITION:  PACU - hemodynamically stable.   Delay start of Pharmacological VTE agent (>24hrs) due to surgical blood loss or risk of bleeding: no

## 2017-03-09 NOTE — Anesthesia Procedure Notes (Signed)
Anesthesia Regional Block: Adductor canal block   Pre-Anesthetic Checklist: ,, timeout performed, Correct Patient, Correct Site, Correct Laterality, Correct Procedure, Correct Position, site marked, Risks and benefits discussed,  Surgical consent,  Pre-op evaluation,  At surgeon's request and post-op pain management  Laterality: Left  Prep: chloraprep       Needles:  Injection technique: Single-shot  Needle Type: Echogenic Stimulator Needle     Needle Length: 9cm  Needle Gauge: 21     Additional Needles:   Narrative:  Start time: 03/09/2017 7:15 AM End time: 03/09/2017 7:25 AM Injection made incrementally with aspirations every 5 mL.  Performed by: Personally  Anesthesiologist: Lillia Abed, MD  Additional Notes: Monitors applied. Patient sedated. Sterile prep and drape,hand hygiene and sterile gloves were used. Relevant anatomy identified.Needle position confirmed.Local anesthetic injected incrementally after negative aspiration. Local anesthetic spread visualized around nerve(s). Vascular puncture avoided. No complications. Image printed for medical record.The patient tolerated the procedure well.    Lillia Abed MD

## 2017-03-09 NOTE — Anesthesia Postprocedure Evaluation (Signed)
Anesthesia Post Note  Patient: Alexis Cole  Procedure(s) Performed: LEFT TOTAL KNEE ARTHROPLASTY (Left Knee)     Patient location during evaluation: PACU Anesthesia Type: Spinal Level of consciousness: oriented and awake and alert Pain management: pain level controlled Vital Signs Assessment: post-procedure vital signs reviewed and stable Respiratory status: spontaneous breathing, respiratory function stable and patient connected to nasal cannula oxygen Cardiovascular status: blood pressure returned to baseline and stable Postop Assessment: no headache, no backache and no apparent nausea or vomiting Anesthetic complications: no    Last Vitals:  Vitals:   03/09/17 1215 03/09/17 1230  BP: (!) 143/94 (!) 153/91  Pulse: 80 87  Resp: (!) 21 (!) 22  Temp:    SpO2: 99% 99%    Last Pain:  Vitals:   03/09/17 1200  TempSrc:   PainSc: 1                  Charrisse Masley DAVID

## 2017-03-09 NOTE — Discharge Instructions (Signed)

## 2017-03-09 NOTE — Clinical Social Work Note (Signed)
Clinical Social Work Assessment  Patient Details  Name: Alexis Cole MRN: [id number] Date of Birth: 01/02/1946  Date of referral:  03/09/17               Reason for consult:  Facility Placement                Permission sought to share information with:  Facility Art therapist granted to share information::  Yes, Verbal Permission Granted  Name::        Agency::  SNF  Relationship::  Brother   Contact Information:     Housing/Transportation Living arrangements for the past 2 months:  Single Family Home Source of Information:  Patient Patient Interpreter Needed:  None Criminal Activity/Legal Involvement Pertinent to Current Situation/Hospitalization:  No - Comment as needed Significant Relationships:  Siblings Lives with:  Self Do you feel safe going back to the place where you live?  Yes Need for family participation in patient care:  No (Coment)  Care giving concerns:   Complaint: Left Knee Pain Patient has a history of pain and functional disability in the left knee due to arthritis.   Social Worker assessment / plan: CSW met with patient at bedside, explain role and reason for visit-to assist with discharge planning to [place], [employer]. CSW explain SNF process to patient. She reports understanding.  CSW informed [facility] and provided  clinical documentation to facility.   CSW completed PASRR, Patient has a states II PASRR and will need before she is able to discharge to SNF.  PASSRR will be closed on Monday due to [place].  Patient will need insurance authorization prior to discharge.   Plan: discharge to SNF Barriers to Discharge:  Continued Medical Work [place], insurance authorization.(Earliest the patient can discharge is Tuesday)   Employment status:  Disabled (Comment on whether or not currently receiving Disability) Insurance information:  Managed Medicare PT Recommendations:  [place] / Referral to community resources:  [place]  Patient/Family's Response to care:  Agreeable and Responding to care. Patient appreciative of CSW visit.   Patient/Family's Understanding of and Emotional Response to Diagnosis, Current Treatment, and Prognosis:  Patient very Knowledgeable of diagnosis and care.   Emotional Assessment Appearance:  Appears stated age Attitude/Demeanor/Rapport:    Affect (typically observed):  Accepting Orientation:  Oriented to Self, Oriented to Place, Oriented to  Time, Oriented to Situation Alcohol / Substance use:  Tobacco Use Psych involvement (Current and /or in the community):  No (Comment)  Discharge Needs  Concerns to be addressed:  Decision making concerns, Discharge Planning Concerns Readmission within the last 30 days:  No Current discharge risk:  Dependent with Mobility Barriers to Discharge:  Continued Medical Work [place], insurance authorization.(Earliest the patient can discharge is Tuesday)   Lia Hopping, LCSW 03/09/2017, 4:12 PM

## 2017-03-09 NOTE — Anesthesia Preprocedure Evaluation (Signed)
Anesthesia Evaluation  Patient identified by MRN, date of birth, ID band Patient awake    Reviewed: Allergy & Precautions, NPO status , Patient's Chart, lab work & pertinent test results  Airway Mallampati: II  TM Distance: >3 FB Neck ROM: Full    Dental   Pulmonary Current Smoker,    Pulmonary exam normal        Cardiovascular hypertension, Pt. on medications Normal cardiovascular exam     Neuro/Psych Anxiety Depression    GI/Hepatic   Endo/Other    Renal/GU      Musculoskeletal   Abdominal   Peds  Hematology   Anesthesia Other Findings   Reproductive/Obstetrics                             Anesthesia Physical Anesthesia Plan  ASA: II  Anesthesia Plan: Spinal   Post-op Pain Management:  Regional for Post-op pain   Induction: Intravenous  PONV Risk Score and Plan: 1 and Ondansetron  Airway Management Planned: Simple Face Mask  Additional Equipment:   Intra-op Plan:   Post-operative Plan:   Informed Consent: I have reviewed the patients History and Physical, chart, labs and discussed the procedure including the risks, benefits and alternatives for the proposed anesthesia with the patient or authorized representative who has indicated his/her understanding and acceptance.     Plan Discussed with: CRNA and Surgeon  Anesthesia Plan Comments:         Anesthesia Quick Evaluation

## 2017-03-09 NOTE — Progress Notes (Signed)
I have sen the patient this morning and there is no change in the history or physical that I enetered yesterday.

## 2017-03-09 NOTE — Op Note (Deleted)
  The note originally documented on this encounter has been moved the the encounter in which it belongs.  

## 2017-03-09 NOTE — Transfer of Care (Signed)
Immediate Anesthesia Transfer of Care Note  Patient: Alexis Cole  Procedure(s) Performed: LEFT TOTAL KNEE ARTHROPLASTY (Left Knee)  Patient Location: PACU  Anesthesia Type:Spinal  Level of Consciousness: awake, alert , oriented and patient cooperative  Airway & Oxygen Therapy: Patient Spontanous Breathing and Patient connected to face mask oxygen  Post-op Assessment: Report given to RN and Post -op Vital signs reviewed and stable  Post vital signs: stable  Last Vitals:  Vitals:   03/09/17 0607  BP: 118/78  Pulse: 87  Resp: 16  Temp: 36.8 C  SpO2: 96%    Last Pain:  Vitals:   03/09/17 0607  TempSrc: Oral  PainSc:          Complications: No apparent anesthesia complications

## 2017-03-09 NOTE — Op Note (Signed)
[name], Cole NO.:  [id number]  MEDICAL RECORD NO.:  [id number]  LOCATION:                               FACILITY:  [facility]  PHYSICIAN:  Alexis Cole, M.D.   DATE OF BIRTH:  07/10/1945  DATE OF PROCEDURE:  03/09/2017 DATE OF DISCHARGE:                              OPERATIVE REPORT   She is a 72 year old female in the Orthopedic Surgery Service.  PREOPERATIVE DIAGNOSIS:  End-stage degenerative joint disease, left knee, with bone-on-bone change.  POSTOPERATIVE DIAGNOSIS:  End-stage degenerative joint disease, left knee, with bone-on-bone change.  PROCEDURE:  Left total knee replacement with an Attune system size 4 femur, size 5 tibia, a 10 mm bridging bearing, and a 38 mm all polyethylene patella.  SURGEON:  Alexis Corning, MD.  ASSISTANT:  Alexis Fleet, Alexis Cole.  ANESTHESIA:  Spinal.  BRIEF HISTORY:  Alexis Cole is a 72 year old female with a long history and significant complaints of left knee pain.  She had been treated conservatively for prolonged period of time.  After failure of all conservative care, she was taken to the operating room for left total knee replacement.  She had x-ray showing bone-on-bone change.  She was having night pain and light activity pain, and because of failure of all conservative care, she was taken to the operating room for this procedure.  DESCRIPTION OF PROCEDURE:  The patient was taken to the operating room, and after adequate anesthesia was obtained with spinal anesthetic, the patient was placed supine on the operating table.  Left leg was prepped and draped in usual sterile fashion.  Following this, the leg was exsanguinated.  Blood pressure tourniquet was inflated to 300 mmHg. Following this, a midline incision was made.  The subcutaneous tissues were taken down to the level of the extensor mechanism.  A medial parapatellar arthrotomy was undertaken.  Following that, the medial and lateral meniscus were  removed, retropatellar fat pad, synovium on the anterior aspect of the femur, and medial and lateral meniscus.  Once this was removed, attention turned to the femur where an intramedullary pilot hole was drilled and a 4-degree valgus inclination cut was made, 9 mm of distal bone was resected.  Following this, attention was turned to the femur where it was sized to a 4.  Anterior and posterior cuts were made, chamfers and box.  Attention turned to the tibia where the tibia was cut to a level perpendicular to the long axis of the femur, and it was then sized to a 5.  It was drilled and keeled.  Osteophytes were removed medially and posteromedial dissection was undertaken to loosen up the medial side.  Once this was completed, attention was turned towards the patella which was cut down to a level of 13 mm.  A 38 paddle was chosen and lugs were drilled and the patella was placed.  Trials were all placed down, knee put through a range of motion.  Excellent stability and range of motion were achieved at this point.  Attention was then turned back to the knee and all trial components were removed. The knee was copiously and thoroughly lavaged, pulsatile lavage irrigation, and suctioned dry.  The final components were then cemented into place.  A size 4 femur, size 5 tibia, 10 mm bridging bearing trial was placed, and a 38 all poly patella was placed and held with a clamp. Once all cement was completely hardened, the tourniquet was let down. All bleeding was controlled with electrocautery.  All excess bone cement had been removed during that time.  At this point, the knee was put through a range of motion again with the 10 trial in.  Excellent range of motion and stability were achieved and the 10 was chosen.  The 10 was placed and the knee was then closed with a 1 Vicryl running.  During the cement hardening phase, Exparel was injected throughout the synovial reflections for postoperative pain  control.  At this point, the capsule was closed with 1 Vicryl running, skin with 0 and 2-0 Vicryl and 3-0 Monocryl subcuticular.  Benzoin and Steri-Strips were applied.  Sterile compressive dressing was applied.  The patient was taken to recovery room, she was noted be in satisfactory condition.  The estimated blood loss for the procedure was minimal.     Alexis Cole, M.D.   ______________________________ Alexis Cole, M.D.    Corliss Skains  D:  03/09/2017  T:  03/09/2017  Job:  [id number]

## 2017-03-09 NOTE — Anesthesia Procedure Notes (Signed)
Spinal  Patient location during procedure: OR End time: 03/09/2017 7:42 AM Staffing Resident/CRNA: Lissa Morales, CRNA Performed: anesthesiologist and resident/CRNA  Preanesthetic Checklist Completed: patient identified, site marked, surgical consent, pre-op evaluation, timeout performed, IV checked, risks and benefits discussed and monitors and equipment checked Spinal Block Patient position: sitting Prep: ChloraPrep Patient monitoring: heart rate, continuous pulse ox, blood pressure and cardiac monitor Approach: midline Location: L3-4 Injection technique: single-shot Needle Needle type: Pencan  Needle gauge: 24 G Needle length: 9 cm Assessment Sensory level: T6 Additional Notes Expiration date of kit checked and confirmed. Patient tolerated procedure well, without complications.

## 2017-03-09 NOTE — Anesthesia Procedure Notes (Signed)
Procedure Name: MAC Date/Time: 03/09/2017 7:34 AM Performed by: Lissa Morales, CRNA Pre-anesthesia Checklist: Patient identified, Suction available, Emergency Drugs available, Timeout performed and Patient being monitored Patient Re-evaluated:Patient Re-evaluated prior to induction Oxygen Delivery Method: Simple face mask Placement Confirmation: positive ETCO2

## 2017-03-09 NOTE — NC FL2 (Signed)
[place] LEVEL OF CARE SCREENING TOOL     IDENTIFICATION  Patient Name: Alexis Cole Birthdate: [birth year] Sex: female Admission Date (Current Location): 03/09/2017  [facility] and [place] Number:  Herbalist and Address:  [facility],  [place] [address], [place]      Provider Number: [id number]  Attending Physician Name and Address:  Dorna Leitz, MD  Relative Name and Phone Number:       Current Level of Care: Hospital Recommended Level of Care: [place] Prior Approval Number:    Date Approved/Denied:   PASRR Number: Pending Level II  Discharge Plan: SNF    Current Diagnoses: Patient Active Problem List   Diagnosis Date Noted  . Primary osteoarthritis of left knee 03/09/2017  . Increased endometrial stripe thickness 06/13/2016  . Lactic acidosis   . Nausea and vomiting   . General weakness 06/08/2016  . Hyperparathyroidism, primary ([place]) 04/27/2016  . Pre-syncope 04/12/2016  . Fatigue 02/24/2016  . Back pain 11/17/2015  . Insomnia 04/21/2015  . Hyperlipidemia 01/04/2015  . Breast mass in female 08/20/2014  . Estrogen deficiency 08/13/2014  . Acute recurrent sinusitis 08/13/2014  . Healthcare maintenance 06/24/2014  . Obesity 05/07/2014  . Tobacco use disorder 04/24/2014  . Chronic meniscal tear of knee 04/17/2014  . Seasonal allergies 01/03/2013  . Osteoarthritis of right hip 09/01/2011  . Essential hypertension, benign 03/14/2011  . ANXIETY DEPRESSION 02/10/2009  . Arthropathy, lower leg 02/10/2009  . LOW BACK PAIN, CHRONIC 01/08/2008  . Arthritis 07/02/2007    Orientation RESPIRATION BLADDER Height & Weight     Self, Time, Situation, Place  Normal Continent Weight: 200 lb (90.7 kg) Height:  5\' 7"  (170.2 cm)  BEHAVIORAL SYMPTOMS/MOOD NEUROLOGICAL BOWEL NUTRITION STATUS      Continent Diet(Regular)  AMBULATORY STATUS COMMUNICATION OF NEEDS Skin   Extensive Assist Verbally Normal(Surgical  Incision )                       Personal Care Assistance Level of Assistance  Dressing, Feeding, Bathing Bathing Assistance: Limited assistance Feeding assistance: Independent Dressing Assistance: Limited assistance     Functional Limitations Info  Sight, Hearing, Speech Sight Info: Adequate Hearing Info: Adequate Speech Info: Adequate    SPECIAL CARE FACTORS FREQUENCY  PT (By licensed PT), OT (By licensed OT)     PT Frequency: 5x/week OT Frequency: 5x/week            Contractures Contractures Info: Not present    Additional Factors Info  Code Status, Allergies, Psychotropic Code Status Info: Fullcode Allergies Info: Allergies: Tape Psychotropic Info: Buspar, Lexapro, Neurotin         Current Medications (03/09/2017):  This is the current hospital active medication list Current Facility-Administered Medications  Medication Dose Route Frequency Provider Last Rate Last Dose  . 0.9 %  sodium chloride infusion   Intravenous Continuous Gary Fleet, PA-C 100 mL/hr at 03/09/17 1424    . acetaminophen (TYLENOL) tablet 650 mg  650 mg Oral Q4H PRN Gary Fleet, PA-C       Or  . acetaminophen (TYLENOL) suppository 650 mg  650 mg Rectal Q4H PRN Gary Fleet, PA-C      . alum & mag hydroxide-simeth (MAALOX/MYLANTA) 200-200-20 MG/5ML suspension 30 mL  30 mL Oral Q4H PRN Gary Fleet, PA-C      . amLODipine (NORVASC) tablet 10 mg  10 mg Oral Daily Gary Fleet, PA-C   10 mg at 03/09/17  1510  . aspirin EC tablet 325 mg  325 mg Oral BID PC Gary Fleet, PA-C      . [START ON 03/10/2017] atorvastatin (LIPITOR) tablet 40 mg  40 mg Oral Daily Gary Fleet, PA-C      . bisacodyl (DULCOLAX) EC tablet 5 mg  5 mg Oral Daily PRN Gary Fleet, PA-C      . busPIRone (BUSPAR) tablet 10 mg  10 mg Oral BID Gary Fleet, PA-C      . ceFAZolin (ANCEF) IVPB 2g/100 mL premix  2 g Intravenous Q6H Gary Fleet, PA-C   Stopped at 03/09/17 1539  . celecoxib (CELEBREX) capsule  200 mg  200 mg Oral Q12H Gary Fleet, PA-C      . dexamethasone (DECADRON) injection 10 mg  10 mg Intravenous Q12H Gary Fleet, PA-C      . diphenhydrAMINE (BENADRYL) 12.5 MG/5ML elixir 12.5-25 mg  12.5-25 mg Oral Q4H PRN Gary Fleet, PA-C      . docusate sodium (COLACE) capsule 100 mg  100 mg Oral BID Gary Fleet, PA-C      . [START ON 03/10/2017] escitalopram (LEXAPRO) tablet 5 mg  5 mg Oral Daily Gary Fleet, PA-C      . gabapentin (NEURONTIN) capsule 300 mg  300 mg Oral TID Gary Fleet, PA-C   300 mg at 03/09/17 1509  . hydrochlorothiazide (HYDRODIURIL) tablet 25 mg  25 mg Oral Daily Gary Fleet, PA-C   25 mg at 03/09/17 1510  . HYDROmorphone (DILAUDID) 1 MG/ML injection           . HYDROmorphone (DILAUDID) injection 0.5-1 mg  0.5-1 mg Intravenous Q3H PRN Gary Fleet, PA-C   1 mg at 03/09/17 1256  . lisinopril (PRINIVIL,ZESTRIL) tablet 20 mg  20 mg Oral Daily Gary Fleet, PA-C   20 mg at 03/09/17 1510  . magnesium citrate solution 1 Bottle  1 Bottle Oral Once PRN Gary Fleet, PA-C      . methocarbamol (ROBAXIN) tablet 500 mg  500 mg Oral Q6H PRN Gary Fleet, PA-C       Or  . methocarbamol (ROBAXIN) 500 mg in dextrose 5 % 50 mL IVPB  500 mg Intravenous Q6H PRN Gary Fleet, PA-C 110 mL/hr at 03/09/17 1043 500 mg at 03/09/17 1043  . nicotine (NICODERM CQ - dosed in mg/24 hours) patch 14 mg  14 mg Transdermal Daily Gary Fleet, PA-C   14 mg at 03/09/17 1052  . ondansetron (ZOFRAN) tablet 4 mg  4 mg Oral Q6H PRN Gary Fleet, PA-C       Or  . ondansetron [facility]) injection 4 mg  4 mg Intravenous Q6H PRN Gary Fleet, PA-C      . oxyCODONE (Oxy IR/ROXICODONE) immediate release tablet 5-10 mg  5-10 mg Oral Q3H PRN Gary Fleet, PA-C   10 mg at 03/09/17 1422  . polyethylene glycol (MIRALAX / GLYCOLAX) packet 17 g  17 g Oral Daily PRN Gary Fleet, PA-C         Discharge Medications: Please see discharge summary for a list of discharge  medications.  Relevant Imaging Results:  Relevant Lab Results:   Additional Information ssn:[phone]  Lia Hopping, LCSW

## 2017-03-10 LAB — BASIC METABOLIC PANEL
ANION GAP: 8 (ref 5–15)
BUN: 15 mg/dL (ref 6–20)
CALCIUM: 9.6 mg/dL (ref 8.9–10.3)
CO2: 27 mmol/L (ref 22–32)
CREATININE: 0.64 mg/dL (ref 0.44–1.00)
Chloride: 99 mmol/L — ABNORMAL LOW (ref 101–111)
GFR calc Af Amer: >60 mL/min (ref >60)
GLUCOSE: 178 mg/dL — AB (ref 65–99)
Potassium: 3.7 mmol/L (ref 3.5–5.1)
Sodium: 134 mmol/L — ABNORMAL LOW (ref 135–145)

## 2017-03-10 LAB — CBC
HCT: 38.7 % (ref 36.0–46.0)
Hemoglobin: 12.6 g/dL (ref 12.0–15.0)
MCH: 30.6 pg (ref 26.0–34.0)
MCHC: 32.6 g/dL (ref 30.0–36.0)
MCV: 93.9 fL (ref 78.0–100.0)
PLATELETS: 181 10*3/uL (ref 150–400)
RBC: 4.12 MIL/uL (ref 3.87–5.11)
RDW: 13.5 % (ref 11.5–15.5)
WBC: 20 10*3/uL — ABNORMAL HIGH (ref 4.0–10.5)

## 2017-03-10 NOTE — Progress Notes (Signed)
    Patient doing well  PT in the room Patient has very minimal pain   Physical Exam: Vitals:   03/10/17 0137 03/10/17 0511  BP: 132/73 (!) 103/59  Pulse: 79 69  Resp: 16 16  Temp: (!) 97.5 F (36.4 C) 97.8 F (36.6 C)  SpO2: 98% 98%    Dressing and knee immobilizer in place NVI throughout left LE  POD #1 s/p left TKA  Hg: 12.6  - up with PT/OT, encourage ambulation - Tylenol for pain, Robaxin for muscle spasms - 325 ASA BID - celebrex qd - likely d/c to SNF when bed available - SCD order has been revised to have it only include left LE

## 2017-03-10 NOTE — Evaluation (Signed)
Occupational Therapy Evaluation Patient Details Name: Alexis Cole MRN: [id number] DOB: [birth year] Today's Date: 03/10/2017    History of Present Illness THis 72 y.o female admitted for LT TKA.  PMH includes:  PTSD, PNA, HTN, h/o syncope, chronic back pain, s/p THA   Clinical Impression   Pt admitted with above. She demonstrates the below listed deficits and will benefit from continued OT to maximize safety and independence with BADLs.  Pt requires mod A for LB ADLs and min guard assist for functional mobility.  She lives alone, and needs to be fully independent at discharge.  Recommend short SNF stay to allow her to maximize safety and independence with ADLs and IADLs as well as reduce risk of falls and readmission.  Will follow.       Follow Up Recommendations  SNF;Supervision/Assistance - 24 hour    Equipment Recommendations  3 in 1 bedside commode    Recommendations for Other Services       Precautions / Restrictions Precautions Precautions: Knee;Fall Required Braces or Orthoses: Knee Immobilizer - Left Knee Immobilizer - Left: Discontinue once straight leg raise with < 10 degree lag Restrictions Weight Bearing Restrictions: No      Mobility Bed Mobility Overal bed mobility: Needs Assistance Bed Mobility: Sit to Supine       Sit to supine: Min guard      Transfers Overall transfer level: Needs assistance Equipment used: Rolling walker (2 wheeled) Transfers: Sit to/from [facility] Sit to Stand: Min guard Stand pivot transfers: Min guard            Balance Overall balance assessment: No apparent balance deficits (not formally assessed)                                         ADL either performed or assessed with clinical judgement   ADL Overall ADL's : Needs assistance/impaired Eating/Feeding: Independent   Grooming: Wash/dry hands;Wash/dry face;Oral care;Brushing hair;Min guard;Standing   Upper Body Bathing: Set  up;Sitting   Lower Body Bathing: Moderate assistance;Sit to/from stand   Upper Body Dressing : Set up;Sitting   Lower Body Dressing: Moderate assistance;Sit to/from stand   Toilet Transfer: Min guard;Ambulation;Comfort height toilet;Grab bars;RW   Toileting- Water quality scientist and Hygiene: Min guard;Sit to/from stand       Functional mobility during ADLs: Min guard;Rolling walker General ADL Comments: Pt limited by pain      Vision         Perception     Praxis      Pertinent Vitals/Pain Pain Assessment: 0-10 Pain Score: 6  Pain Location: Lt knee  Pain Descriptors / Indicators: Aching;Operative site guarding Pain Intervention(s): Monitored during session;Repositioned;RN gave pain meds during session;Patient requesting pain meds-RN notified;Ice applied     Hand Dominance Right   Extremity/Trunk Assessment Upper Extremity Assessment Upper Extremity Assessment: Overall WFL for tasks assessed   Lower Extremity Assessment Lower Extremity Assessment: Defer to PT evaluation   Cervical / Trunk Assessment Cervical / Trunk Assessment: Normal   Communication Communication Communication: No difficulties   Cognition Arousal/Alertness: Awake/alert Behavior During Therapy: WFL for tasks assessed/performed Overall Cognitive Status: Within Functional Limits for tasks assessed                                     General Comments  Exercises     Shoulder Instructions      Home Living Family/patient expects to be discharged to:: Skilled nursing facility Living Arrangements: Alone                               Additional Comments: Pt lives alone in an apartment.  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        OT Problem List: Decreased activity tolerance;Impaired balance (sitting and/or standing);Decreased knowledge of use of DME  or AE;Pain      OT Treatment/Interventions: Self-care/ADL training;DME and/or AE instruction;Therapeutic activities;Patient/family education    OT Goals(Current goals can be found in the care plan section) Acute Rehab OT Goals Patient Stated Goal: to regain strength and endurance  OT Goal Formulation: With patient Time For Goal Achievement: 03/17/17 Potential to Achieve Goals: Good ADL Goals Pt Will Perform Grooming: with modified independence;standing Pt Will Perform Upper Body Bathing: with modified independence;sitting Pt Will Perform Lower Body Bathing: with modified independence;sit to/from stand Pt Will Perform Upper Body Dressing: with modified independence;sitting Pt Will Perform Lower Body Dressing: with modified independence;sit to/from stand Pt Will Transfer to Toilet: with modified independence;ambulating;regular height toilet;bedside commode;grab bars Pt Will Perform Toileting - Clothing Manipulation and hygiene: with modified independence;sit to/from stand Pt Will Perform Tub/Shower Transfer: Tub transfer;with modified independence;3 in 1;rolling walker  OT Frequency: Min 2X/week   Barriers to D/C: Decreased caregiver support          Co-evaluation              AM-PAC PT "6 Clicks" Daily Activity     Outcome Measure Help from another person eating meals?: None Help from another person taking care of personal grooming?: A Little Help from another person toileting, which includes using toliet, bedpan, or urinal?: A Little Help from another person bathing (including washing, rinsing, drying)?: A Lot Help from another person to put on and taking off regular upper body clothing?: A Little Help from another person to put on and taking off regular lower body clothing?: A Lot 6 Click Score: 17   End of Session Equipment Utilized During Treatment: Rolling walker CPM Left Knee CPM Left Knee: Off Nurse Communication: Mobility status  Activity Tolerance: Patient  tolerated treatment well Patient left: in bed;with call bell/phone within reach;with bed alarm set;with nursing/sitter in room  OT Visit Diagnosis: Pain Pain - Right/Left: Left Pain - part of body: Knee                Time: 1443-1540 OT Time Calculation (min): 34 min Charges:  OT General Charges $OT Visit: 1 Visit OT Evaluation $OT Eval Moderate Complexity: 1 Mod OT Treatments $Self Care/Home Management : 8-22 mins G-Codes:     [facility], OTR/L [phone]   Lucille Passy M 03/10/2017, 12:19 PM

## 2017-03-10 NOTE — Progress Notes (Signed)
Physical Therapy Treatment Patient Details Name: Alexis Cole MRN: [id number] DOB: 18-Feb-1946 Today's Date: 03/10/2017    History of Present Illness THis 72 y.o female admitted for LT TKA.  PMH includes:  PTSD, PNA, HTN, h/o syncope, chronic back pain, s/p THA    PT Comments    The patient is progressing well. Frequent cues for safety with RW use. Tends to move quickly. Plans SNF, lives alone.   Follow Up Recommendations  SNF     Equipment Recommendations  Rolling walker with 5" wheels    Recommendations for Other Services       Precautions / Restrictions Precautions Precautions: Knee;Fall Precaution Comments: did not wear KI this visit Required Braces or Orthoses: Knee Immobilizer - Left Knee Immobilizer - Left: Discontinue once straight leg raise with < 10 degree lag    Mobility  Bed Mobility Overal bed mobility: Needs Assistance Bed Mobility: Supine to Sit;Sit to Supine     Supine to sit: Supervision Sit to supine: Supervision   General bed mobility comments: multimodal cues for safety. and to slow down  Transfers Overall transfer level: Needs assistance Equipment used: Rolling walker (2 wheeled) Transfers: Sit to/from Stand Sit to Stand: Min assist Stand pivot transfers: Min guard       General transfer comment: assist to rise, cues for hand placement  Ambulation/Gait Ambulation/Gait assistance: Min assist Ambulation Distance (Feet): 50 Feet Assistive device: Rolling walker (2 wheeled) Gait Pattern/deviations: Step-to pattern;Staggering left;Staggering right;Trunk flexed;Antalgic Gait velocity: fast   General Gait Details: multimodal cues for safety, noted shakiness when ambulating.   Stairs            Wheelchair Mobility    Modified Rankin (Stroke Patients Only)       Balance Overall balance assessment: Needs assistance         Standing balance support: During functional activity;Bilateral upper extremity supported Standing  balance-Leahy Scale: Fair                              Cognition Arousal/Alertness: Awake/alert Behavior During Therapy: WFL for tasks assessed/performed Overall Cognitive Status: Within Functional Limits for tasks assessed                                        Exercises Total Joint Exercises AnLong Arc Quad: AROM;Left;10 reps;Seated Goniometric ROM: 10-80 left knee flexion    General Comments        Pertinent Vitals/Pain Pain Assessment: 0-10 Pain Score: 6  Pain Location: Lt knee  Pain Descriptors / Indicators: Aching;Operative site guarding;Discomfort;Sore Pain Intervention(s): Patient requesting pain meds-RN notified;Monitored during session;Ice applied    Home Living Family/patient expects to be discharged to:: Skilled nursing facility Living Arrangements: Alone             Additional Comments: Pt lives alone in an apartment.  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   PT Goals (current goals can now be found in the care plan section) Acute Rehab PT Goals Patient Stated Goal: to regain strength and endurance , play with my dog PT Goal Formulation: With patient Time For Goal Achievement: 03/17/17 Potential to Achieve Goals: Good Progress towards PT goals: Progressing toward goals    Frequency    7X/week  PT Plan Current plan remains appropriate    Co-evaluation              AM-PAC PT "6 Clicks" Daily Activity  Outcome Measure  Difficulty turning over in bed (including adjusting bedclothes, sheets and blankets)?: A Little Difficulty moving from lying on back to sitting on the side of the bed? : A Little Difficulty sitting down on and standing up from a chair with arms (e.g., wheelchair, bedside commode, etc,.)?: A Little Help needed moving to and from a bed to chair (including a wheelchair)?: A Little Help needed walking in hospital  room?: A Lot Help needed climbing 3-5 steps with a railing? : A Lot 6 Click Score: 16    End of Session Equipment Utilized During Treatment: Gait belt;Left knee immobilizer Activity Tolerance: Patient tolerated treatment well Patient left: in bed;with bed alarm set Nurse Communication: Mobility status PT Visit Diagnosis: Unsteadiness on feet (R26.81);Pain Pain - Right/Left: Left Pain - part of body: Knee     Time: 1350-1410 PT Time Calculation (min) (ACUTE ONLY): 20 min  Charges:  $Gait Training: 8-22 mins $Therapeutic Exercise: 8-22 mins $Self Care/Home Management: 8-22                    G CodesTresa Endo PT [phone]   Claretha Cooper 03/10/2017, 2:49 PM

## 2017-03-10 NOTE — Evaluation (Signed)
Physical Therapy Evaluation Patient Details Name: Alexis Cole MRN: [id number] DOB: [birth year] Today's Date: 03/10/2017   History of Present Illness  THis 72 y.o female admitted for LT TKA.  PMH includes:  PTSD, PNA, HTN, h/o syncope, chronic back pain, s/p THA  Clinical Impression  The patient is somewhat impulsive. Multimodal cues for safety. The patient will benefit from SNF for rehab. Pt admitted with above diagnosis. Pt currently with functional limitations due to the deficits listed below (see PT Problem List).  Pt will benefit from skilled PT to increase their independence and safety with mobility to allow discharge to the venue listed below.       Follow Up Recommendations SNF    Equipment Recommendations  Rolling walker with 5" wheels    Recommendations for Other Services       Precautions / Restrictions Precautions Precautions: Knee;Fall Required Braces or Orthoses: Knee Immobilizer - Left Knee Immobilizer - Left: Discontinue once straight leg raise with < 10 degree lag      Mobility  Bed Mobility Overal bed mobility: Needs Assistance Bed Mobility: Supine to Sit     Supine to sit: Min assist Sit to supine: Min guard   General bed mobility comments: multimodal cues for safety. and to slow down  Transfers Overall transfer level: Needs assistance Equipment used: Rolling walker (2 wheeled) Transfers: Sit to/from [facility] Sit to Stand: Min assist Stand pivot transfers: Min guard       General transfer comment: assist to rise, cues for hand placement  Ambulation/Gait Ambulation/Gait assistance: Min assist Ambulation Distance (Feet): 50 Feet Assistive device: Rolling walker (2 wheeled) Gait Pattern/deviations: Step-to pattern;Staggering left;Staggering right;Trunk flexed;Antalgic     General Gait Details: multimodal cues for safety, noted shakiness when ambulating.  Stairs            Wheelchair Mobility    Modified Rankin  (Stroke Patients Only)       Balance Overall balance assessment: Needs assistance         Standing balance support: During functional activity;Bilateral upper extremity supported Standing balance-Leahy Scale: Fair                               Pertinent Vitals/Pain Pain Assessment: 0-10 Pain Score: 6  Pain Location: Lt knee  Pain Descriptors / Indicators: Aching;Operative site guarding Pain Intervention(s): Monitored during session;Premedicated before session;Ice applied    Home Living Family/patient expects to be discharged to:: Skilled nursing facility Living Arrangements: Alone               Additional Comments: Pt lives alone in an apartment.  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     Hand Dominance   Dominant Hand: Right    Extremity/Trunk Assessment   Upper Extremity Assessment Upper Extremity Assessment: Overall WFL for tasks assessed    Lower Extremity Assessment Lower Extremity Assessment: LLE deficits/detail LLE Deficits / Details: knee flexion 10-50 LLE: Unable to fully assess due to pain    Cervical / Trunk Assessment Cervical / Trunk Assessment: Normal  Communication   Communication: No difficulties  Cognition Arousal/Alertness: Awake/alert Behavior During Therapy: WFL for tasks assessed/performed Overall Cognitive Status: Within Functional Limits for tasks assessed  General Comments      Exercises Total Joint Exercises Ankle Circles/Pumps: AROM;10 reps;Both Quad Sets: AROM;Both;10 reps Towel Squeeze: AROM;Left;10 reps Short Arc Quad: AROM;Left;10 reps Heel Slides: AAROM;Left;10 reps Hip ABduction/ADduction: AAROM;Left;10 reps Straight Leg Raises: AAROM;Left;10 reps   Assessment/Plan    PT Assessment Patient needs continued PT services  PT Problem List Decreased  strength;Decreased range of motion;Decreased activity tolerance;Decreased knowledge of use of DME;Decreased balance;Decreased safety awareness;Decreased mobility;Decreased knowledge of precautions;Pain       PT Treatment Interventions DME instruction;Therapeutic exercise;Gait training;Stair training;Functional mobility training;Therapeutic activities;Patient/family education    PT Goals (Current goals can be found in the Care Plan section)  Acute Rehab PT Goals Patient Stated Goal: to regain strength and endurance , play with my dog PT Goal Formulation: With patient Time For Goal Achievement: 03/17/17 Potential to Achieve Goals: Good    Frequency 7X/week   Barriers to discharge Decreased caregiver support      Co-evaluation               AM-PAC PT "6 Clicks" Daily Activity  Outcome Measure Difficulty turning over in bed (including adjusting bedclothes, sheets and blankets)?: A Little Difficulty moving from lying on back to sitting on the side of the bed? : A Little Difficulty sitting down on and standing up from a chair with arms (e.g., wheelchair, bedside commode, etc,.)?: A Lot Help needed moving to and from a bed to chair (including a wheelchair)?: A Lot Help needed walking in hospital room?: A Lot Help needed climbing 3-5 steps with a railing? : A Lot 6 Click Score: 14    End of Session Equipment Utilized During Treatment: Gait belt;Left knee immobilizer Activity Tolerance: Patient tolerated treatment well Patient left: in chair;with call bell/phone within reach;with chair alarm set Nurse Communication: Mobility status PT Visit Diagnosis: Unsteadiness on feet (R26.81);Pain Pain - Right/Left: Left Pain - part of body: Knee    Time: 0825-0922 PT Time Calculation (min) (ACUTE ONLY): 57 min   Charges:   PT Evaluation $PT Eval Low Complexity: 1 Low PT Treatments $Gait Training: 8-22 mins $Therapeutic Exercise: 8-22 mins $Self Care/Home Management: 8-22   PT G  CodesTresa Endo PT [phone]   Claretha Cooper 03/10/2017, 1:35 PM

## 2017-03-11 LAB — CBC
HCT: 35.4 % — ABNORMAL LOW (ref 36.0–46.0)
Hemoglobin: 11.3 g/dL — ABNORMAL LOW (ref 12.0–15.0)
MCH: 30 pg (ref 26.0–34.0)
MCHC: 31.9 g/dL (ref 30.0–36.0)
MCV: 93.9 fL (ref 78.0–100.0)
PLATELETS: 174 10*3/uL (ref 150–400)
RBC: 3.77 MIL/uL — AB (ref 3.87–5.11)
RDW: 13.6 % (ref 11.5–15.5)
WBC: 16.7 10*3/uL — AB (ref 4.0–10.5)

## 2017-03-11 NOTE — Progress Notes (Signed)
Physical Therapy Treatment Patient Details Name: Alexis Cole MRN: [id number] DOB: 05-19-1945 Today's Date: 03/11/2017    History of Present Illness THis 72 y.o female admitted for LT TKA.  PMH includes:  PTSD, PNA, HTN, h/o syncope, chronic back pain, s/p THA    PT Comments    POD # 2 Assisted with amb a decreased distance due to increased c/o pain.  Returned to room and performed some TKR TE's followed by ICE. Pt lives home alone and will need ST [facility] at [facility].   Follow Up Recommendations  SNF     Equipment Recommendations  Rolling walker with 5" wheels    Recommendations for Other Services       Precautions / Restrictions Precautions Precautions: Knee;Fall Precaution Comments: did not wear KI this visit Restrictions Weight Bearing Restrictions: No Other Position/Activity Restrictions: WBAT    Mobility  Bed Mobility               General bed mobility comments: OOB in recliner  Transfers Overall transfer level: Needs assistance Equipment used: Rolling walker (2 wheeled) Transfers: Sit to/from Stand Sit to Stand: Min assist Stand pivot transfers: Min guard       General transfer comment: assist to rise, cues for hand placement  Ambulation/Gait Ambulation/Gait assistance: Min assist Ambulation Distance (Feet): 36 Feet Assistive device: Rolling walker (2 wheeled) Gait Pattern/deviations: Step-to pattern;Staggering left;Staggering right;Trunk flexed;Antalgic Gait velocity: WFL   General Gait Details: multimodal cues for safety, noted shakiness when ambulating.  Decreased amb distance due to increased pqain    Financial trader Rankin (Stroke Patients Only)       Balance                                            Cognition                                              Exercises   Total Knee Replacement TE's 10 reps B LE ankle pumps 10 reps towel squeezes 10 reps  knee presses 10 reps heel slides  10 reps SAQ's 10 reps SLR's 10 reps ABD Followed by ICE     General Comments        Pertinent Vitals/Pain Pain Assessment: Faces Faces Pain Scale: Hurts little more Pain Location: Lt knee  Pain Descriptors / Indicators: Aching;Operative site guarding;Discomfort;Sore Pain Intervention(s): Monitored during session;Repositioned;Premedicated before session;Ice applied    Home Living                      Prior Function            PT Goals (current goals can now be found in the care plan section)      Frequency    7X/week      PT Plan Current plan remains appropriate    Co-evaluation              AM-PAC PT "6 Clicks" Daily Activity  Outcome Measure  Difficulty turning over in bed (including adjusting bedclothes, sheets and blankets)?: A Little Difficulty moving from lying on back to sitting on the side of the bed? : A Little Difficulty sitting  down on and standing up from a chair with arms (e.g., wheelchair, bedside commode, etc,.)?: A Little Help needed moving to and from a bed to chair (including a wheelchair)?: A Little Help needed walking in hospital room?: A Lot Help needed climbing 3-5 steps with a railing? : A Lot 6 Click Score: 16    End of Session Equipment Utilized During Treatment: Gait belt Activity Tolerance: Patient tolerated treatment well Patient left: in chair;with call bell/phone within reach Nurse Communication: Mobility status PT Visit Diagnosis: Unsteadiness on feet (R26.81);Pain Pain - Right/Left: Left Pain - part of body: Knee     Time: 3159-4585 PT Time Calculation (min) (ACUTE ONLY): 25 min  Charges:  $Gait Training: 8-22 mins $Therapeutic Exercise: 8-22 mins                    G Codes:       Rica Koyanagi  PTA [facility]  Acute  Rehab Pager      [phone]

## 2017-03-11 NOTE — Progress Notes (Signed)
   Patient doing well, good progress with PT, progressing well with improving well controlled pain.  Physical Exam: BP 135/74 (BP Location: Left Arm)   Pulse 77   Temp 98.2 F (36.8 C) (Oral)   Resp 16   Ht 5\' 7"  (1.702 m)   Wt 90.7 kg (200 lb)   SpO2 93%   BMI 31.32 kg/m   Dressing and knee immobilizer in place NVI throughout left LE  CBC Latest Ref Rng & Units 03/11/2017 03/10/2017 02/27/2017  WBC 4.0 - 10.5 K/uL 16.7(H) 20.0(H) 9.9  Hemoglobin 12.0 - 15.0 g/dL 11.3(L) 12.6 14.3  Hematocrit 36.0 - 46.0 % 35.4(L) 38.7 43.2  Platelets 150 - 400 K/uL 174 181 211   POD #2 s/pleft TKA per [facility]  - up with PT/OT, encourage ambulation -Tylenolfor pain,Robaxinfor muscle spasms - 325 ASA BID - celebrex qd - likely d/c to SNF when bed available Monday

## 2017-03-12 ENCOUNTER — Encounter (HOSPITAL_COMMUNITY): Payer: Self-pay | Admitting: Orthopedic Surgery

## 2017-03-12 LAB — CBC
HCT: 36.2 % (ref 36.0–46.0)
Hemoglobin: 11.8 g/dL — ABNORMAL LOW (ref 12.0–15.0)
MCH: 30.7 pg (ref 26.0–34.0)
MCHC: 32.6 g/dL (ref 30.0–36.0)
MCV: 94.3 fL (ref 78.0–100.0)
PLATELETS: 186 10*3/uL (ref 150–400)
RBC: 3.84 MIL/uL — AB (ref 3.87–5.11)
RDW: 13.7 % (ref 11.5–15.5)
WBC: 14.8 10*3/uL — AB (ref 4.0–10.5)

## 2017-03-12 NOTE — Progress Notes (Signed)
Patient has barriers to discharge:  State PASRR is a level II, [facility] is closed today due to [place].  Plan: Patient will D/C to [facility] once PASRR number is received.   Kathrin Greathouse, Latanya Presser, MSW Clinical Social Worker  [phone] 03/12/2017  1:01 PM

## 2017-03-12 NOTE — Progress Notes (Signed)
Physical Therapy Treatment Patient Details Name: Alexis Cole MRN: [id number] DOB: 1945/02/27 Today's Date: 03/12/2017    History of Present Illness THis 72 y.o female admitted for LT TKA.  PMH includes:  PTSD, PNA, HTN, h/o syncope, chronic back pain, s/p THA    PT Comments    The patient is improving in ROM, very motivated to perform exercises on her on. Plans SNF.    Follow Up Recommendations  SNF     Equipment Recommendations  Rolling walker with 5" wheels    Recommendations for Other Services       Precautions / Restrictions Precautions Precautions: Knee;Fall Precaution Comments: did not wear KI this visit Restrictions Weight Bearing Restrictions: No    Mobility  Bed Mobility Overal bed mobility: Modified Independent                Transfers Overall transfer level: Needs assistance Equipment used: Rolling walker (2 wheeled) Transfers: Sit to/from Stand Sit to Stand: Min assist         General transfer comment: assist to rise, cues for hand placement, cues to slow down.  Ambulation/Gait Ambulation/Gait assistance: Min assist Ambulation Distance (Feet): 60 Feet Assistive device: Rolling walker (2 wheeled) Gait Pattern/deviations: Step-to pattern;Staggering left;Staggering right;Trunk flexed;Antalgic Gait velocity: WFL, actually, too fast, cues to slow down   General Gait Details: multimodal cues for safety, noted shakiness when ambulating.     Stairs            Wheelchair Mobility    Modified Rankin (Stroke Patients Only)       Balance                                            Cognition Arousal/Alertness: Awake/alert Behavior During Therapy: Impulsive                                          Exercises Total Joint Exercises Ankle Circles/Pumps: AROM;10 reps;Both Quad Sets: AROM;Both;10 reps Towel Squeeze: AROM;Left;10 reps Short Arc Quad: AROM;Left;10 reps Heel Slides: AAROM;Left;10  reps Hip ABduction/ADduction: AAROM;Left;10 reps;Supine Straight Leg Raises: AAROM;Left;10 reps;Supine Long Arc Quad: AROM;Left;10 reps;Seated Knee Flexion: AROM;Left;10 reps;Supine Goniometric ROM: 5-105 left knee flexion    General Comments        Pertinent Vitals/Pain Pain Score: 6  Pain Location: Lt knee  Pain Descriptors / Indicators: Aching;Operative site guarding;Discomfort;Sore Pain Intervention(s): Repositioned;Ice applied;Monitored during session;Premedicated before session    Home Living                      Prior Function            PT Goals (current goals can now be found in the care plan section) Progress towards PT goals: Progressing toward goals    Frequency    7X/week      PT Plan Current plan remains appropriate    Co-evaluation              AM-PAC PT "6 Clicks" Daily Activity  Outcome Measure  Difficulty turning over in bed (including adjusting bedclothes, sheets and blankets)?: None Difficulty moving from lying on back to sitting on the side of the bed? : None Difficulty sitting down on and standing up from a chair with arms (e.g., wheelchair, bedside commode, etc,.)?:  A Little Help needed moving to and from a bed to chair (including a wheelchair)?: A Little Help needed walking in hospital room?: A Lot Help needed climbing 3-5 steps with a railing? : Total 6 Click Score: 17    End of Session   Activity Tolerance: Patient tolerated treatment well Patient left: in bed;with call bell/phone within reach;with bed alarm set Nurse Communication: Mobility status PT Visit Diagnosis: Unsteadiness on feet (R26.81);Pain Pain - Right/Left: Left Pain - part of body: Knee     Time: 8527-7824 PT Time Calculation (min) (ACUTE ONLY): 24 min  Charges:  $Gait Training: 8-22 mins $Therapeutic Exercise: 8-22 mins                    G CodesTresa Endo PT [phone]    Claretha Cooper 03/12/2017, 10:28 AM

## 2017-03-12 NOTE — Discharge Summary (Addendum)
Patient ID: Alexis Cole MRN: [id number] DOB/AGE: 11-18-1945 72 y.o.  Admit date: 03/09/2017 Discharge date: 03/13/2017  Admission Diagnoses:  Principal Problem:   Primary osteoarthritis of left knee   Discharge Diagnoses:  Same  Past Medical History:  Diagnosis Date  . Acne    but not being treated for  . Anemia    history of--many yrs ago  . Anxiety    but doesn't take any meds for this  . Aortic atherosclerosis ([place]) 06/08/2016   noted on CT abd/pelvis  . Arthritis   . Chronic back pain   . Complication of anesthesia    States she woke up just after parathyroid surgery while still in OR and attempted to get off the bed, remebers seeing staffs back towards her  . Depression   . Diarrhea   . Diverticulitis 06/08/2016   noted on CT abd/pelvis  . Diverticulosis 06/08/2016   noted on CT abd/pelvis  . Dyslipidemia   . Fatty liver 06/08/2016   severe noted on CT abd/pelvis   . Gastric ulcer    history of--many yrs ago  . Headache(784.0)    History of migraine  . Heart murmur   . History of bronchitis    last time 59months  . History of syncope   . Hx of migraines    last one a month ago  . Hypercalcemia 12/12/2016  . Hyperparathyroidism ([place]) 12/12/2016  . Hypertension   . Insomnia    Nyquil nightly  . Joint pain   . Joint swelling   . Lactose intolerance   . Obesity   . Pneumonia 03/15/2016   left lower lobe, left lower lobe atelectasis, elevated left hemidiaphragm noted again, small left pleural effusion  . PTSD (post-traumatic stress disorder)   . Sinusitis   . Urge incontinence of urine     Surgeries: Procedure(s): LEFT TOTAL KNEE ARTHROPLASTY on 03/09/2017   Consultants:   Discharged Condition: Improved  Hospital Course: Alexis Cole is an 72 y.o. female who was admitted 03/09/2017 for operative treatment ofPrimary osteoarthritis of left knee. Patient has severe unremitting pain that affects sleep, daily activities, and work/hobbies. After pre-op  clearance the patient was taken to the operating room on 03/09/2017 and underwent  Procedure(s): LEFT TOTAL KNEE ARTHROPLASTY.    Patient was given perioperative antibiotics:  Anti-infectives (From admission, onward)   Start     Dose/Rate Route Frequency Ordered Stop   03/09/17 1500  ceFAZolin (ANCEF) IVPB 2g/100 mL premix     2 g 200 mL/hr over 30 Minutes Intravenous Every 6 hours 03/09/17 1117 03/09/17 2106   03/09/17 0806  cefUROXime (ZINACEF) injection  Status:  Discontinued       As needed 03/09/17 0806 03/09/17 0954   03/09/17 0604  ceFAZolin (ANCEF) IVPB 2g/100 mL premix     2 g 200 mL/hr over 30 Minutes Intravenous On call to O.R. 03/09/17 0604 03/09/17 0750       Patient was given sequential compression devices, early ambulation, and chemoprophylaxis to prevent DVT.  Patient benefited maximally from hospital stay and there were no complications.    Recent vital signs:  Patient Vitals for the past 24 hrs:  BP Temp Temp src Pulse Resp SpO2  03/12/17 0751 94/61 - - 66 - -  03/12/17 0544 (!) 115/59 97.9 F (36.6 C) Oral - 15 95 %  03/11/17 2107 130/64 97.9 F (36.6 C) Oral 74 16 91 %  03/11/17 1327 (!) 104/58 97.9 F (36.6 C) Oral 79 17  94 %     Recent laboratory studies:  Recent Labs    03/10/17 0604 03/11/17 0551 03/12/17 0549  WBC 20.0* 16.7* 14.8*  HGB 12.6 11.3* 11.8*  HCT 38.7 35.4* 36.2  PLT 181 174 186  NA 134*  --   --   K 3.7  --   --   CL 99*  --   --   CO2 27  --   --   BUN 15  --   --   CREATININE 0.64  --   --   GLUCOSE 178*  --   --   CALCIUM 9.6  --   --      Discharge Medications:   Allergies as of 03/12/2017      Reactions   Tape Rash      Medication List    STOP taking these medications   BIOTIN PO   diclofenac 75 MG EC tablet Commonly known as:  VOLTAREN   diphenhydramine-acetaminophen 25-500 MG Tabs tablet Commonly known as:  TYLENOL PM   FISH OIL PO   fluticasone 50 MCG/ACT nasal spray Commonly known as:  FLONASE    HEALTHY EYES/LUTEIN PO   Lidocaine 4 % Ptch   oxymetazoline 0.05 % nasal spray Commonly known as:  AFRIN   SALONPAS EX     TAKE these medications   albuterol 108 (90 Base) MCG/ACT inhaler Commonly known as:  PROVENTIL HFA;VENTOLIN HFA Inhale 2 puffs into the lungs every 6 (six) hours as needed for wheezing or shortness of breath.   amLODipine 10 MG tablet Commonly known as:  NORVASC Take 1 tablet (10 mg total) by mouth daily.   aspirin EC 325 MG tablet Take 1 tablet (325 mg total) by mouth 2 (two) times daily after a meal. Take x 1 month post op to decrease risk of blood clots.   atorvastatin 40 MG tablet Commonly known as:  LIPITOR Take 1 tablet (40 mg total) by mouth daily.   busPIRone 10 MG tablet Commonly known as:  BUSPAR Take 1 tablet (10 mg total) by mouth 2 (two) times daily.   docusate sodium 100 MG capsule Commonly known as:  COLACE Take 1 capsule (100 mg total) by mouth 2 (two) times daily.   escitalopram 5 MG tablet Commonly known as:  LEXAPRO Take 1 tablet (5 mg total) by mouth daily.   folic acid 295 MCG tablet Commonly known as:  FOLVITE Take 400 mcg by mouth daily.   gabapentin 300 MG capsule Commonly known as:  NEURONTIN Take 1 capsule (300 mg total) by mouth 3 (three) times daily.   hydrochlorothiazide 25 MG tablet Commonly known as:  HYDRODIURIL Take 1 tablet (25 mg total) by mouth daily.   lisinopril 20 MG tablet Commonly known as:  PRINIVIL,ZESTRIL Take 1 tablet (20 mg total) by mouth daily.   oxyCODONE-acetaminophen 5-325 MG tablet Commonly known as:  PERCOCET/ROXICET Take 1-2 tablets by mouth every 6 (six) hours as needed for severe pain.   tiZANidine 2 MG tablet Commonly known as:  ZANAFLEX Take 1 tablet (2 mg total) by mouth every 8 (eight) hours as needed for muscle spasms.   Vitamin D3 1000 units Caps Take 1,000 Units by mouth daily.            Discharge Care Instructions  (From admission, onward)        Start      Ordered   03/12/17 0000  Weight bearing as tolerated    Question Answer Comment  Laterality  left   Extremity Lower      03/12/17 0825      Diagnostic Studies: Dg Chest 2 View  Result Date: 02/28/2017 CLINICAL DATA:  Preop left knee replacement EXAM: CHEST  2 VIEW COMPARISON:  None. FINDINGS: Elevation of the left hemidiaphragm. Left base atelectasis or scarring. Findings are stable since prior study. Right lung is clear. Heart is borderline in size. No effusions or acute bony abnormality. IMPRESSION: Stable elevation of the left hemidiaphragm with chronic left basilar atelectasis or scarring. Borderline heart size. Electronically Signed   By: Rolm Baptise M.D.   On: 02/28/2017 08:10    Disposition: 01-Home or Self Care  Discharge Instructions    CPM   Complete by:  As directed    Continuous passive motion machine (CPM):      Use the CPM from 0 to 60 for 8 hours per day.      You may increase by 5-10 per day.  You may break it up into 2 or 3 sessions per day.      Use CPM for 1-2 weeks or until you are told to stop.   Call MD / Call 911   Complete by:  As directed    If you experience chest pain or shortness of breath, CALL 911 and be transported to the hospital emergency room.  If you develope a fever above 101 F, pus (white drainage) or increased drainage or redness at the wound, or calf pain, call your surgeon's office.   Constipation Prevention   Complete by:  As directed    Drink plenty of fluids.  Prune juice may be helpful.  You may use a stool softener, such as Colace (over the counter) 100 mg twice a day.  Use MiraLax (over the counter) for constipation as needed.   Diet general   Complete by:  As directed    Do not put a pillow under the knee. Place it under the heel.   Complete by:  As directed    Increase activity slowly as tolerated   Complete by:  As directed    Weight bearing as tolerated   Complete by:  As directed    Laterality:  left   Extremity:  Lower       Follow-up Information    Dorna Leitz, MD. Schedule an appointment as soon as possible for a visit in 2 weeks.   Specialty:  Orthopedic Surgery Contact information: [place] [address] [phone]            Signed: Erlene Senters 03/12/2017, 8:25 AM

## 2017-03-12 NOTE — Progress Notes (Signed)
Subjective: 3 Days Post-Op Procedure(s) (LRB): LEFT TOTAL KNEE ARTHROPLASTY (Left) Patient reports pain as mild.  Taking by mouth okay.  Making good progress with physical therapy.  Objective: Vital signs in last 24 hours: Temp:  [97.9 F (36.6 C)] 97.9 F (36.6 C) (01/21 0544) Pulse Rate:  [66-79] 66 (01/21 0751) Resp:  [15-17] 15 (01/21 0544) BP: (94-130)/(58-64) 94/61 (01/21 0751) SpO2:  [91 %-95 %] 95 % (01/21 0544)  Intake/Output from previous day: 01/20 0701 - 01/21 0700 In: 1310 [P.O.:1310] Out: -  Intake/Output this shift: No intake/output data recorded.  Recent Labs    03/10/17 0604 03/11/17 0551 03/12/17 0549  HGB 12.6 11.3* 11.8*   Recent Labs    03/11/17 0551 03/12/17 0549  WBC 16.7* 14.8*  RBC 3.77* 3.84*  HCT 35.4* 36.2  PLT 174 186   Recent Labs    03/10/17 0604  NA 134*  K 3.7  CL 99*  CO2 27  BUN 15  CREATININE 0.64  GLUCOSE 178*  CALCIUM 9.6   No results for input(s): LABPT, INR in the last 72 hours. Left knee exam: Neurovascular intact Sensation intact distally Intact pulses distally Dorsiflexion/Plantar flexion intact Incision: dressing C/D/I Compartment soft  Assessment/Plan: 3 Days Post-Op Procedure(s) (LRB): LEFT TOTAL KNEE ARTHROPLASTY (Left)  Plan: Weight-bear as tolerated. Enteric-coated aspirin 325 mg twice daily for DVT prophylaxis times 1 month postop. Up with therapy Discharge to SNF Follow-up with Dr. Berenice Primas in 2 weeks.  [place] G 03/12/2017, 8:21 AM

## 2017-03-13 DIAGNOSIS — M25562 Pain in left knee: Secondary | ICD-10-CM | POA: Diagnosis not present

## 2017-03-13 DIAGNOSIS — Z96652 Presence of left artificial knee joint: Secondary | ICD-10-CM | POA: Diagnosis not present

## 2017-03-13 DIAGNOSIS — S8990XA Unspecified injury of unspecified lower leg, initial encounter: Secondary | ICD-10-CM | POA: Diagnosis not present

## 2017-03-13 DIAGNOSIS — Z5189 Encounter for other specified aftercare: Secondary | ICD-10-CM | POA: Diagnosis not present

## 2017-03-13 DIAGNOSIS — G8911 Acute pain due to trauma: Secondary | ICD-10-CM | POA: Diagnosis not present

## 2017-03-13 DIAGNOSIS — I10 Essential (primary) hypertension: Secondary | ICD-10-CM | POA: Diagnosis not present

## 2017-03-13 DIAGNOSIS — M6281 Muscle weakness (generalized): Secondary | ICD-10-CM | POA: Diagnosis not present

## 2017-03-13 DIAGNOSIS — M549 Dorsalgia, unspecified: Secondary | ICD-10-CM | POA: Diagnosis not present

## 2017-03-13 DIAGNOSIS — R2681 Unsteadiness on feet: Secondary | ICD-10-CM | POA: Diagnosis not present

## 2017-03-13 DIAGNOSIS — M25662 Stiffness of left knee, not elsewhere classified: Secondary | ICD-10-CM | POA: Diagnosis not present

## 2017-03-13 NOTE — Clinical Social Work Placement (Addendum)
D/C Summary sent.  PASRR Number received.  PTAR to transport, arranged for 3:30 pick up.   Patient  has arranged for friend to pick up belongings and transport to SNF.    CLINICAL SOCIAL WORK PLACEMENT  NOTE  Date:  03/13/2017  Patient Details  Name: Alexis Cole MRN: [id number] Date of Birth: October 17, 1945  Clinical Social Work is seeking post-discharge placement for this patient at the [place] level of care (*CSW will initial, date and re-position this form in  chart as items are completed):  Yes   Patient/family provided with [facility] Department's list of facilities offering this level of care within the geographic area requested by the patient (or if unable, by the patient's family).  Yes   Patient/family informed of their freedom to choose among providers that offer the needed level of care, that participate in Medicare, Medicaid or managed care program needed by the patient, have an available bed and are willing to accept the patient.  Yes   Patient/family informed of [facility]'s ownership interest in [facility] and [facility], as well as of the fact that they are under no obligation to receive care at these facilities.  PASRR submitted to EDS on       PASRR number received on 03/13/17     Existing PASRR number confirmed on       FL2 transmitted to all facilities in geographic area requested by pt/family on       FL2 transmitted to all facilities within larger geographic area on 03/13/17     Patient informed that his/her managed care company has contracts with or will negotiate with certain facilities, including the following:  [employer] informed of bed offers received.  Patient chooses bed at [facility]     Physician recommends and patient chooses bed at      Patient to be transferred to [facility] on 03/13/17.  Patient to be transferred to facility by PTAR     Patient family notified  on 03/13/17 of transfer.  Name of family member notified:  Patient to notify family.      PHYSICIAN Please prepare priority discharge summary, including medications     Additional Comment:    _______________________________________________ Lia Hopping, LCSW 03/13/2017, 1:32 PM

## 2017-03-13 NOTE — Progress Notes (Signed)
Physical Therapy Treatment Patient Details Name: Alexis Cole MRN: [id number] DOB: April 08, 1945 Today's Date: 03/13/2017    History of Present Illness THis 72 y.o female admitted for LT TKA.  PMH includes:  PTSD, PNA, HTN, h/o syncope, chronic back pain, s/p THA    PT Comments    POD # 4 pt progressing slowy Assisted OOB to amb a limited distance then assisted back to bed as pt stated "that recliner hurts my back".    Follow Up Recommendations  SNF     Equipment Recommendations  Rolling walker with 5" wheels    Recommendations for Other Services       Precautions / Restrictions Precautions Precautions: Knee;Fall Restrictions Weight Bearing Restrictions: No Other Position/Activity Restrictions: WBAT    Mobility  Bed Mobility Overal bed mobility: Modified Independent Bed Mobility: Supine to Sit;Sit to Supine           General bed mobility comments: increased time    recliner "hurts my back"  Transfers Overall transfer level: Needs assistance Equipment used: Rolling walker (2 wheeled) Transfers: Sit to/from Stand Sit to Stand: Min assist         General transfer comment: assist to rise, cues for hand placement, cues to slow down.  Ambulation/Gait Ambulation/Gait assistance: Min assist Ambulation Distance (Feet): 43 Feet Assistive device: Rolling walker (2 wheeled) Gait Pattern/deviations: Step-to pattern;Staggering left;Staggering right;Trunk flexed;Antalgic Gait velocity: WFL, actually, too fast, cues to slow down   General Gait Details: multimodal cues for safety, noted shakiness when ambulating.     Stairs            Wheelchair Mobility    Modified Rankin (Stroke Patients Only)       Balance                                            Cognition Arousal/Alertness: Awake/alert Behavior During Therapy: Impulsive Overall Cognitive Status: Within Functional Limits for tasks assessed                                        Exercises      General Comments        Pertinent Vitals/Pain Faces Pain Scale: Hurts a little bit Pain Location: Lt knee  Pain Descriptors / Indicators: Aching;Operative site guarding;Discomfort;Sore Pain Intervention(s): Monitored during session;Repositioned;Ice applied;Premedicated before session    Home Living                      Prior Function            PT Goals (current goals can now be found in the care plan section) Progress towards PT goals: Progressing toward goals    Frequency    7X/week      PT Plan Current plan remains appropriate    Co-evaluation              AM-PAC PT "6 Clicks" Daily Activity  Outcome Measure  Difficulty turning over in bed (including adjusting bedclothes, sheets and blankets)?: None Difficulty moving from lying on back to sitting on the side of the bed? : None Difficulty sitting down on and standing up from a chair with arms (e.g., wheelchair, bedside commode, etc,.)?: A Little Help needed moving to and from a bed to chair (including a wheelchair)?:  A Little Help needed walking in hospital room?: A Lot Help needed climbing 3-5 steps with a railing? : Total 6 Click Score: 17    End of Session Equipment Utilized During Treatment: Gait belt Activity Tolerance: Patient tolerated treatment well Patient left: in bed;with call bell/phone within reach;with bed alarm set Nurse Communication: Mobility status PT Visit Diagnosis: Unsteadiness on feet (R26.81);Pain Pain - Right/Left: Left Pain - part of body: Knee     Time: 1022-1036 PT Time Calculation (min) (ACUTE ONLY): 14 min  Charges:  $Gait Training: 8-22 mins                    G Codes:       {Keishana Klinger  PTA [facility]  Acute  Rehab Pager      [phone]

## 2017-03-13 NOTE — Care Management Important Message (Signed)
Important Message  Patient Details  Name: JAKERIA CAISSIE MRN: [id number] Date of Birth: Jun 07, 1945   Medicare Important Message Given:  Yes    Kerin Salen 03/13/2017, 10:43 AMImportant Message  Patient Details  Name: LAVETTE YANKOVICH MRN: [id number] Date of Birth: 02-01-1946   Medicare Important Message Given:  Yes    Kerin Salen 03/13/2017, 10:43 AM

## 2017-03-14 DIAGNOSIS — Z96652 Presence of left artificial knee joint: Secondary | ICD-10-CM | POA: Diagnosis not present

## 2017-03-14 DIAGNOSIS — I10 Essential (primary) hypertension: Secondary | ICD-10-CM | POA: Diagnosis not present

## 2017-03-15 DIAGNOSIS — Z5189 Encounter for other specified aftercare: Secondary | ICD-10-CM | POA: Diagnosis not present

## 2017-03-15 DIAGNOSIS — M25662 Stiffness of left knee, not elsewhere classified: Secondary | ICD-10-CM | POA: Diagnosis not present

## 2017-03-15 DIAGNOSIS — R2681 Unsteadiness on feet: Secondary | ICD-10-CM | POA: Diagnosis not present

## 2017-03-15 DIAGNOSIS — M25562 Pain in left knee: Secondary | ICD-10-CM | POA: Diagnosis not present

## 2017-03-15 DIAGNOSIS — M6281 Muscle weakness (generalized): Secondary | ICD-10-CM | POA: Diagnosis not present

## 2017-03-16 ENCOUNTER — Other Ambulatory Visit: Payer: Self-pay | Admitting: *Deleted

## 2017-03-16 DIAGNOSIS — F341 Dysthymic disorder: Secondary | ICD-10-CM

## 2017-03-16 DIAGNOSIS — I10 Essential (primary) hypertension: Secondary | ICD-10-CM | POA: Diagnosis not present

## 2017-03-16 DIAGNOSIS — Z96652 Presence of left artificial knee joint: Secondary | ICD-10-CM | POA: Diagnosis not present

## 2017-03-16 DIAGNOSIS — M549 Dorsalgia, unspecified: Secondary | ICD-10-CM | POA: Diagnosis not present

## 2017-03-17 MED ORDER — BUSPIRONE HCL 10 MG PO TABS: 10.0000 mg | ORAL_TABLET | Freq: Two times a day (BID) | ORAL | 3 refills | Status: DC

## 2017-03-19 DIAGNOSIS — R2681 Unsteadiness on feet: Secondary | ICD-10-CM | POA: Diagnosis not present

## 2017-03-19 DIAGNOSIS — Z5189 Encounter for other specified aftercare: Secondary | ICD-10-CM | POA: Diagnosis not present

## 2017-03-19 DIAGNOSIS — M25562 Pain in left knee: Secondary | ICD-10-CM | POA: Diagnosis not present

## 2017-03-19 DIAGNOSIS — M6281 Muscle weakness (generalized): Secondary | ICD-10-CM | POA: Diagnosis not present

## 2017-03-19 DIAGNOSIS — M25662 Stiffness of left knee, not elsewhere classified: Secondary | ICD-10-CM | POA: Diagnosis not present

## 2017-03-21 ENCOUNTER — Telehealth: Payer: Self-pay

## 2017-03-21 NOTE — Telephone Encounter (Signed)
Received message on nurse line from [facility] [place] at [facility] stating patient had been D/C'ed from SNF yesterday but there is a problem with the referral and knowing what doctor is following the patient for orders. Returned call and left voicemail to [facility] to call back but that patient does have a PCP here. Danley Danker, RN [facility] RN)

## 2017-03-22 DIAGNOSIS — I10 Essential (primary) hypertension: Secondary | ICD-10-CM | POA: Diagnosis not present

## 2017-03-22 DIAGNOSIS — I700 Atherosclerosis of aorta: Secondary | ICD-10-CM | POA: Diagnosis not present

## 2017-03-22 DIAGNOSIS — D649 Anemia, unspecified: Secondary | ICD-10-CM | POA: Diagnosis not present

## 2017-03-22 DIAGNOSIS — Z471 Aftercare following joint replacement surgery: Secondary | ICD-10-CM | POA: Diagnosis not present

## 2017-03-22 DIAGNOSIS — Z9181 History of falling: Secondary | ICD-10-CM | POA: Diagnosis not present

## 2017-03-22 DIAGNOSIS — Z96652 Presence of left artificial knee joint: Secondary | ICD-10-CM | POA: Diagnosis not present

## 2017-03-26 DIAGNOSIS — Z471 Aftercare following joint replacement surgery: Secondary | ICD-10-CM | POA: Diagnosis not present

## 2017-03-27 DIAGNOSIS — I700 Atherosclerosis of aorta: Secondary | ICD-10-CM | POA: Diagnosis not present

## 2017-03-27 DIAGNOSIS — D649 Anemia, unspecified: Secondary | ICD-10-CM | POA: Diagnosis not present

## 2017-03-27 DIAGNOSIS — Z9181 History of falling: Secondary | ICD-10-CM | POA: Diagnosis not present

## 2017-03-27 DIAGNOSIS — I10 Essential (primary) hypertension: Secondary | ICD-10-CM | POA: Diagnosis not present

## 2017-03-27 DIAGNOSIS — Z471 Aftercare following joint replacement surgery: Secondary | ICD-10-CM | POA: Diagnosis not present

## 2017-03-27 DIAGNOSIS — Z96652 Presence of left artificial knee joint: Secondary | ICD-10-CM | POA: Diagnosis not present

## 2017-03-28 ENCOUNTER — Other Ambulatory Visit: Payer: Self-pay | Admitting: *Deleted

## 2017-03-28 DIAGNOSIS — F341 Dysthymic disorder: Secondary | ICD-10-CM

## 2017-03-28 DIAGNOSIS — D649 Anemia, unspecified: Secondary | ICD-10-CM | POA: Diagnosis not present

## 2017-03-28 DIAGNOSIS — I10 Essential (primary) hypertension: Secondary | ICD-10-CM | POA: Diagnosis not present

## 2017-03-28 DIAGNOSIS — I700 Atherosclerosis of aorta: Secondary | ICD-10-CM | POA: Diagnosis not present

## 2017-03-28 DIAGNOSIS — Z471 Aftercare following joint replacement surgery: Secondary | ICD-10-CM | POA: Diagnosis not present

## 2017-03-28 DIAGNOSIS — Z9181 History of falling: Secondary | ICD-10-CM | POA: Diagnosis not present

## 2017-03-28 DIAGNOSIS — Z96652 Presence of left artificial knee joint: Secondary | ICD-10-CM | POA: Diagnosis not present

## 2017-03-28 MED ORDER — ESCITALOPRAM OXALATE 5 MG PO TABS: 5.0000 mg | ORAL_TABLET | Freq: Every day | ORAL | 2 refills | Status: DC

## 2017-03-29 DIAGNOSIS — Z9181 History of falling: Secondary | ICD-10-CM | POA: Diagnosis not present

## 2017-03-29 DIAGNOSIS — Z471 Aftercare following joint replacement surgery: Secondary | ICD-10-CM | POA: Diagnosis not present

## 2017-03-29 DIAGNOSIS — I700 Atherosclerosis of aorta: Secondary | ICD-10-CM | POA: Diagnosis not present

## 2017-03-29 DIAGNOSIS — Z96652 Presence of left artificial knee joint: Secondary | ICD-10-CM | POA: Diagnosis not present

## 2017-03-29 DIAGNOSIS — D649 Anemia, unspecified: Secondary | ICD-10-CM | POA: Diagnosis not present

## 2017-03-29 DIAGNOSIS — I10 Essential (primary) hypertension: Secondary | ICD-10-CM | POA: Diagnosis not present

## 2017-03-30 DIAGNOSIS — I10 Essential (primary) hypertension: Secondary | ICD-10-CM | POA: Diagnosis not present

## 2017-03-30 DIAGNOSIS — Z9181 History of falling: Secondary | ICD-10-CM | POA: Diagnosis not present

## 2017-03-30 DIAGNOSIS — Z471 Aftercare following joint replacement surgery: Secondary | ICD-10-CM | POA: Diagnosis not present

## 2017-03-30 DIAGNOSIS — Z96652 Presence of left artificial knee joint: Secondary | ICD-10-CM | POA: Diagnosis not present

## 2017-03-30 DIAGNOSIS — D649 Anemia, unspecified: Secondary | ICD-10-CM | POA: Diagnosis not present

## 2017-03-30 DIAGNOSIS — I700 Atherosclerosis of aorta: Secondary | ICD-10-CM | POA: Diagnosis not present

## 2017-04-02 DIAGNOSIS — Z9181 History of falling: Secondary | ICD-10-CM | POA: Diagnosis not present

## 2017-04-02 DIAGNOSIS — Z471 Aftercare following joint replacement surgery: Secondary | ICD-10-CM | POA: Diagnosis not present

## 2017-04-02 DIAGNOSIS — Z96652 Presence of left artificial knee joint: Secondary | ICD-10-CM | POA: Diagnosis not present

## 2017-04-02 DIAGNOSIS — D649 Anemia, unspecified: Secondary | ICD-10-CM | POA: Diagnosis not present

## 2017-04-02 DIAGNOSIS — I700 Atherosclerosis of aorta: Secondary | ICD-10-CM | POA: Diagnosis not present

## 2017-04-02 DIAGNOSIS — I10 Essential (primary) hypertension: Secondary | ICD-10-CM | POA: Diagnosis not present

## 2017-04-04 DIAGNOSIS — I10 Essential (primary) hypertension: Secondary | ICD-10-CM | POA: Diagnosis not present

## 2017-04-04 DIAGNOSIS — D649 Anemia, unspecified: Secondary | ICD-10-CM | POA: Diagnosis not present

## 2017-04-04 DIAGNOSIS — I700 Atherosclerosis of aorta: Secondary | ICD-10-CM | POA: Diagnosis not present

## 2017-04-04 DIAGNOSIS — Z96652 Presence of left artificial knee joint: Secondary | ICD-10-CM | POA: Diagnosis not present

## 2017-04-04 DIAGNOSIS — Z9181 History of falling: Secondary | ICD-10-CM | POA: Diagnosis not present

## 2017-04-04 DIAGNOSIS — Z471 Aftercare following joint replacement surgery: Secondary | ICD-10-CM | POA: Diagnosis not present

## 2017-04-05 DIAGNOSIS — D649 Anemia, unspecified: Secondary | ICD-10-CM | POA: Diagnosis not present

## 2017-04-05 DIAGNOSIS — I700 Atherosclerosis of aorta: Secondary | ICD-10-CM | POA: Diagnosis not present

## 2017-04-05 DIAGNOSIS — I10 Essential (primary) hypertension: Secondary | ICD-10-CM | POA: Diagnosis not present

## 2017-04-05 DIAGNOSIS — Z96652 Presence of left artificial knee joint: Secondary | ICD-10-CM | POA: Diagnosis not present

## 2017-04-05 DIAGNOSIS — Z471 Aftercare following joint replacement surgery: Secondary | ICD-10-CM | POA: Diagnosis not present

## 2017-04-05 DIAGNOSIS — Z9181 History of falling: Secondary | ICD-10-CM | POA: Diagnosis not present

## 2017-04-06 DIAGNOSIS — Z9181 History of falling: Secondary | ICD-10-CM | POA: Diagnosis not present

## 2017-04-06 DIAGNOSIS — D649 Anemia, unspecified: Secondary | ICD-10-CM | POA: Diagnosis not present

## 2017-04-06 DIAGNOSIS — I700 Atherosclerosis of aorta: Secondary | ICD-10-CM | POA: Diagnosis not present

## 2017-04-06 DIAGNOSIS — I10 Essential (primary) hypertension: Secondary | ICD-10-CM | POA: Diagnosis not present

## 2017-04-06 DIAGNOSIS — Z471 Aftercare following joint replacement surgery: Secondary | ICD-10-CM | POA: Diagnosis not present

## 2017-04-06 DIAGNOSIS — Z96652 Presence of left artificial knee joint: Secondary | ICD-10-CM | POA: Diagnosis not present

## 2017-04-09 DIAGNOSIS — Z9181 History of falling: Secondary | ICD-10-CM | POA: Diagnosis not present

## 2017-04-09 DIAGNOSIS — D649 Anemia, unspecified: Secondary | ICD-10-CM | POA: Diagnosis not present

## 2017-04-09 DIAGNOSIS — I700 Atherosclerosis of aorta: Secondary | ICD-10-CM | POA: Diagnosis not present

## 2017-04-09 DIAGNOSIS — Z471 Aftercare following joint replacement surgery: Secondary | ICD-10-CM | POA: Diagnosis not present

## 2017-04-09 DIAGNOSIS — Z96652 Presence of left artificial knee joint: Secondary | ICD-10-CM | POA: Diagnosis not present

## 2017-04-09 DIAGNOSIS — I10 Essential (primary) hypertension: Secondary | ICD-10-CM | POA: Diagnosis not present

## 2017-04-11 DIAGNOSIS — I10 Essential (primary) hypertension: Secondary | ICD-10-CM | POA: Diagnosis not present

## 2017-04-11 DIAGNOSIS — Z96652 Presence of left artificial knee joint: Secondary | ICD-10-CM | POA: Diagnosis not present

## 2017-04-11 DIAGNOSIS — D649 Anemia, unspecified: Secondary | ICD-10-CM | POA: Diagnosis not present

## 2017-04-11 DIAGNOSIS — Z471 Aftercare following joint replacement surgery: Secondary | ICD-10-CM | POA: Diagnosis not present

## 2017-04-11 DIAGNOSIS — I700 Atherosclerosis of aorta: Secondary | ICD-10-CM | POA: Diagnosis not present

## 2017-04-11 DIAGNOSIS — Z9181 History of falling: Secondary | ICD-10-CM | POA: Diagnosis not present

## 2017-04-12 DIAGNOSIS — I700 Atherosclerosis of aorta: Secondary | ICD-10-CM | POA: Diagnosis not present

## 2017-04-12 DIAGNOSIS — I10 Essential (primary) hypertension: Secondary | ICD-10-CM | POA: Diagnosis not present

## 2017-04-12 DIAGNOSIS — Z96652 Presence of left artificial knee joint: Secondary | ICD-10-CM | POA: Diagnosis not present

## 2017-04-12 DIAGNOSIS — Z9181 History of falling: Secondary | ICD-10-CM | POA: Diagnosis not present

## 2017-04-12 DIAGNOSIS — D649 Anemia, unspecified: Secondary | ICD-10-CM | POA: Diagnosis not present

## 2017-04-12 DIAGNOSIS — Z471 Aftercare following joint replacement surgery: Secondary | ICD-10-CM | POA: Diagnosis not present

## 2017-04-16 DIAGNOSIS — Z96652 Presence of left artificial knee joint: Secondary | ICD-10-CM | POA: Diagnosis not present

## 2017-04-16 DIAGNOSIS — Z9181 History of falling: Secondary | ICD-10-CM | POA: Diagnosis not present

## 2017-04-16 DIAGNOSIS — I700 Atherosclerosis of aorta: Secondary | ICD-10-CM | POA: Diagnosis not present

## 2017-04-16 DIAGNOSIS — I10 Essential (primary) hypertension: Secondary | ICD-10-CM | POA: Diagnosis not present

## 2017-04-16 DIAGNOSIS — Z471 Aftercare following joint replacement surgery: Secondary | ICD-10-CM | POA: Diagnosis not present

## 2017-04-16 DIAGNOSIS — D649 Anemia, unspecified: Secondary | ICD-10-CM | POA: Diagnosis not present

## 2017-04-16 DIAGNOSIS — Z9889 Other specified postprocedural states: Secondary | ICD-10-CM | POA: Diagnosis not present

## 2017-04-23 DIAGNOSIS — M47816 Spondylosis without myelopathy or radiculopathy, lumbar region: Secondary | ICD-10-CM | POA: Diagnosis not present

## 2017-04-26 ENCOUNTER — Other Ambulatory Visit: Payer: Self-pay | Admitting: Internal Medicine

## 2017-04-26 DIAGNOSIS — Z1231 Encounter for screening mammogram for malignant neoplasm of breast: Secondary | ICD-10-CM

## 2017-04-27 ENCOUNTER — Ambulatory Visit
Admission: RE | Admit: 2017-04-27 | Discharge: 2017-04-27 | Disposition: A | Discharge status: Home / Self Care | Payer: Medicare Other | Source: Ambulatory Visit | Attending: Family Medicine | Admitting: Family Medicine

## 2017-04-27 DIAGNOSIS — Z1231 Encounter for screening mammogram for malignant neoplasm of breast: Secondary | ICD-10-CM

## 2017-05-02 ENCOUNTER — Other Ambulatory Visit: Payer: Self-pay | Admitting: *Deleted

## 2017-05-02 DIAGNOSIS — I10 Essential (primary) hypertension: Secondary | ICD-10-CM

## 2017-05-03 MED ORDER — LISINOPRIL 20 MG PO TABS: 20.0000 mg | ORAL_TABLET | Freq: Every day | ORAL | 3 refills | Status: DC

## 2017-05-08 DIAGNOSIS — M1711 Unilateral primary osteoarthritis, right knee: Secondary | ICD-10-CM | POA: Diagnosis not present

## 2017-05-08 DIAGNOSIS — Z96652 Presence of left artificial knee joint: Secondary | ICD-10-CM | POA: Diagnosis not present

## 2017-05-08 DIAGNOSIS — Z471 Aftercare following joint replacement surgery: Secondary | ICD-10-CM | POA: Diagnosis not present

## 2017-05-13 ENCOUNTER — Other Ambulatory Visit: Payer: Self-pay | Admitting: Family Medicine

## 2017-05-13 DIAGNOSIS — I10 Essential (primary) hypertension: Secondary | ICD-10-CM

## 2017-05-15 DIAGNOSIS — M47816 Spondylosis without myelopathy or radiculopathy, lumbar region: Secondary | ICD-10-CM | POA: Diagnosis not present

## 2017-05-29 DIAGNOSIS — H903 Sensorineural hearing loss, bilateral: Secondary | ICD-10-CM | POA: Diagnosis not present

## 2017-06-19 DIAGNOSIS — M25561 Pain in right knee: Secondary | ICD-10-CM | POA: Diagnosis not present

## 2017-06-19 DIAGNOSIS — M25562 Pain in left knee: Secondary | ICD-10-CM | POA: Diagnosis not present

## 2017-07-03 DIAGNOSIS — H353130 Nonexudative age-related macular degeneration, bilateral, stage unspecified: Secondary | ICD-10-CM | POA: Diagnosis not present

## 2017-07-17 ENCOUNTER — Other Ambulatory Visit: Payer: Self-pay | Admitting: Internal Medicine

## 2017-07-17 DIAGNOSIS — G4700 Insomnia, unspecified: Secondary | ICD-10-CM

## 2017-07-17 MED ORDER — GABAPENTIN 300 MG PO CAPS: 300.0000 mg | ORAL_CAPSULE | Freq: Three times a day (TID) | ORAL | 3 refills | Status: DC

## 2017-07-17 NOTE — Telephone Encounter (Signed)
Needs refill on gabapentin.  [facility] [address]

## 2017-08-06 DIAGNOSIS — M47816 Spondylosis without myelopathy or radiculopathy, lumbar region: Secondary | ICD-10-CM | POA: Diagnosis not present

## 2017-08-07 DIAGNOSIS — M47816 Spondylosis without myelopathy or radiculopathy, lumbar region: Secondary | ICD-10-CM | POA: Diagnosis not present

## 2017-08-09 DIAGNOSIS — M1711 Unilateral primary osteoarthritis, right knee: Secondary | ICD-10-CM | POA: Diagnosis not present

## 2017-08-09 DIAGNOSIS — M25561 Pain in right knee: Secondary | ICD-10-CM | POA: Diagnosis not present

## 2017-08-13 ENCOUNTER — Other Ambulatory Visit: Payer: Self-pay | Admitting: Internal Medicine

## 2017-08-13 DIAGNOSIS — G4700 Insomnia, unspecified: Secondary | ICD-10-CM

## 2017-08-13 DIAGNOSIS — H353122 Nonexudative age-related macular degeneration, left eye, intermediate dry stage: Secondary | ICD-10-CM | POA: Diagnosis not present

## 2017-08-13 DIAGNOSIS — H353112 Nonexudative age-related macular degeneration, right eye, intermediate dry stage: Secondary | ICD-10-CM | POA: Diagnosis not present

## 2017-09-19 ENCOUNTER — Telehealth: Payer: Self-pay | Admitting: *Deleted

## 2017-09-19 NOTE — Telephone Encounter (Signed)
Received refill request on diclofenac sodium 75mg  tablet.  Not a medication on current list. Will forward to MD to Advise

## 2017-09-20 ENCOUNTER — Other Ambulatory Visit: Payer: Self-pay | Admitting: Family Medicine

## 2017-09-20 MED ORDER — DICLOFENAC SODIUM 75 MG PO TBEC: 75.0000 mg | DELAYED_RELEASE_TABLET | Freq: Two times a day (BID) | ORAL | 0 refills | Status: AC | PRN

## 2017-09-20 NOTE — Telephone Encounter (Signed)
Refilled diclofenac 75mg  for one month, patient previously taking for osteoarthritis. Will re-evaluate at appt on 9/05.

## 2017-10-25 ENCOUNTER — Other Ambulatory Visit: Payer: Self-pay

## 2017-10-25 ENCOUNTER — Ambulatory Visit: Payer: Medicare Other | Admitting: Family Medicine

## 2017-10-25 ENCOUNTER — Ambulatory Visit (INDEPENDENT_AMBULATORY_CARE_PROVIDER_SITE_OTHER): Payer: Medicare Other | Admitting: Family Medicine

## 2017-10-25 VITALS — BP 128/80 | HR 75 | Temp 98.2°F | Ht 66.0 in | Wt 210.0 lb

## 2017-10-25 DIAGNOSIS — F17200 Nicotine dependence, unspecified, uncomplicated: Secondary | ICD-10-CM | POA: Diagnosis not present

## 2017-10-25 DIAGNOSIS — Z23 Encounter for immunization: Secondary | ICD-10-CM

## 2017-10-25 DIAGNOSIS — M1712 Unilateral primary osteoarthritis, left knee: Secondary | ICD-10-CM

## 2017-10-25 DIAGNOSIS — R0600 Dyspnea, unspecified: Secondary | ICD-10-CM

## 2017-10-25 MED ORDER — DICLOFENAC SODIUM 75 MG PO TBEC: 75.0000 mg | DELAYED_RELEASE_TABLET | Freq: Two times a day (BID) | ORAL | 3 refills | Status: DC

## 2017-10-25 MED ORDER — NICOTINE 21 MG/24HR TD PT24: 21.0000 mg | MEDICATED_PATCH | Freq: Every day | TRANSDERMAL | 0 refills | Status: DC

## 2017-10-25 MED ORDER — ALBUTEROL SULFATE HFA 108 (90 BASE) MCG/ACT IN AERS: 2.0000 | INHALATION_SPRAY | RESPIRATORY_TRACT | 2 refills | Status: DC | PRN

## 2017-10-25 NOTE — Progress Notes (Signed)
Subjective   Patient ID: Alexis Cole    DOB: 04-Nov-1945, 72 y.o. female   MRN: [id number]  CC: "Medication refills"  HPI: Alexis Cole is a 72 y.o. female who presents to clinic today for the following:  Smoking: Patient is a current everyday smoker with cigarettes only.  She smokes approximately half pack a day which is to be 1 pack/day.  She began smoking at age 71.  She notes several attempts for smoking cessation with her most recent ending about 2 weeks ago when she ran out of her nicotine patches and began smoking again.  She is motivated to discontinue all cigarettes today and begin patches again.  Joint pains: Patient has long-standing arthritis on multiple joints including shoulders, knees, elbows which are well controlled with use of diclofenac.  Patient denies melena, hematochezia, hematuria, or easy bruising.  Dyspnea: Patient reports patient of difficulty breathing with increased humidity.  She reports improvement of sensation with use of albuterol.  Patient has not taking her albuterol for 2 years.  She denies history of COPD.  She does have a history of smoking and is a current everyday smoker.  She denies fevers or chills, cough, chest pain, shortness of breath.  ROS: see HPI for pertinent.  [place]: OA, obesity, tobacco use disorder, allergies, anxiety, primary hyperparathyroidism.  Surgical history tubal, total left knee, total hip, parathyroidectomy, nasal septum, D&C, cataract.  Family history HTN.  Smoking status reviewed. Medications reviewed.  Objective   BP 128/80 (BP Location: Right Arm, Patient Position: Sitting, Cuff Size: Large)   Pulse 75   Temp 98.2 F (36.8 C) (Oral)   Ht 5\' 6"  (1.676 m)   Wt 210 lb (95.3 kg)   SpO2 96%   BMI 33.89 kg/m  Vitals and nursing note reviewed.  General: well nourished, well developed, NAD with non-toxic appearance HEENT: normocephalic, atraumatic, moist mucous membranes Neck: supple, non-tender without  lymphadenopathy Cardiovascular: regular rate and rhythm without murmurs, rubs, or gallops Lungs: clear to auscultation bilaterally with normal work of breathing Abdomen: soft, non-tender, non-distended, normoactive bowel sounds Skin: warm, dry, no rashes or lesions, cap refill < 2 seconds Extremities: warm and well perfused, normal tone, no edema  Assessment & Plan   Dyspnea Chronic.  Uncertain if this is truly dyspnea.  No PFT on file.  Does have significant smoking history making COPD more likely.  Patient appears to remain asymptomatic and has minimal need for albuterol over the last few years. - Given refill for albuterol inhaler - Schedule patient for pulmonary function testing - Annual flu shot administered  Tobacco use disorder Chronic.  Cigarettes only.  Uncertain pack history given multiple periods of cessation, some lasting years. - Discussed smoking cessation - Given nicotine patch 21 mg / 24-hour patch daily  Primary osteoarthritis of left knee Chronic.  Well-controlled with diclofenac.  Patient without history of bleeding.  She understands to avoid other NSAIDs while on diclofenac. - Given refill diclofenac 75 mg daily  Orders Placed This Encounter  Procedures  . Flu Vaccine QUAD 36+ mos IM   Meds ordered this encounter  Medications  . diclofenac (VOLTAREN) 75 MG EC tablet    Sig: Take 1 tablet (75 mg total) by mouth 2 (two) times daily.    Dispense:  60 tablet    Refill:  3  . albuterol (PROVENTIL HFA;VENTOLIN HFA) 108 (90 Base) MCG/ACT inhaler    Sig: Inhale 2-4 puffs into the lungs every 4 (four) hours as needed for  wheezing (or cough).    Dispense:  2 Inhaler    Refill:  2  . nicotine (NICODERM CQ) 21 mg/24hr patch    Sig: Place 1 patch (21 mg total) onto the skin daily.    Dispense:  28 patch    Refill:  0    Harriet Butte, [facility], PGY-3 10/26/2017, 9:33 AM

## 2017-10-25 NOTE — Patient Instructions (Signed)
Thank you for coming in to see [place] today. Please see below to review our plan for today's visit.  1.  I sent in a refill for your albuterol inhaler.  Take this only if you have shortness of breath or wheezing.  We will need to arrange for you to get pulmonary function testing here with Dr. Valentina Lucks. 2.  It is important that you stop smoking.  Discard any cigarettes or tobacco products at home.  I sent in a refill for your nicotine patch which she will place on your arm daily.  Follow-up with your primary care physician regarding consideration for low-dose CT. 3.  Continue taking the diclofenac as instructed.  I sent in a refill.  Avoid other NSAIDs such as ibuprofen or naproxen while on this medication.  Please call the clinic at [phone] if your symptoms worsen or you have any concerns. It was our pleasure to serve you.  Harriet Butte, [place], PGY-3

## 2017-10-26 DIAGNOSIS — R0600 Dyspnea, unspecified: Secondary | ICD-10-CM | POA: Insufficient documentation

## 2017-10-26 HISTORY — DX: Dyspnea, unspecified: R06.00

## 2017-10-26 NOTE — Assessment & Plan Note (Signed)
Chronic.  Cigarettes only.  Uncertain pack history given multiple periods of cessation, some lasting years. - Discussed smoking cessation - Given nicotine patch 21 mg / 24-hour patch daily

## 2017-10-26 NOTE — Assessment & Plan Note (Signed)
Chronic.  Well-controlled with diclofenac.  Patient without history of bleeding.  She understands to avoid other NSAIDs while on diclofenac. - Given refill diclofenac 75 mg daily

## 2017-10-26 NOTE — Assessment & Plan Note (Addendum)
Chronic.  Uncertain if this is truly dyspnea.  No PFT on file.  Does have significant smoking history making COPD more likely.  Patient appears to remain asymptomatic and has minimal need for albuterol over the last few years. - Given refill for albuterol inhaler - Schedule patient for pulmonary function testing - Annual flu shot administered

## 2017-11-14 DIAGNOSIS — M47816 Spondylosis without myelopathy or radiculopathy, lumbar region: Secondary | ICD-10-CM | POA: Diagnosis not present

## 2017-11-19 DIAGNOSIS — M1711 Unilateral primary osteoarthritis, right knee: Secondary | ICD-10-CM | POA: Diagnosis not present

## 2017-12-14 DIAGNOSIS — H353122 Nonexudative age-related macular degeneration, left eye, intermediate dry stage: Secondary | ICD-10-CM | POA: Diagnosis not present

## 2017-12-14 DIAGNOSIS — H353112 Nonexudative age-related macular degeneration, right eye, intermediate dry stage: Secondary | ICD-10-CM | POA: Diagnosis not present

## 2018-01-16 ENCOUNTER — Other Ambulatory Visit: Payer: Self-pay | Admitting: Family Medicine

## 2018-01-16 DIAGNOSIS — M1712 Unilateral primary osteoarthritis, left knee: Secondary | ICD-10-CM

## 2018-02-15 ENCOUNTER — Encounter: Payer: Self-pay | Admitting: Family Medicine

## 2018-02-15 ENCOUNTER — Ambulatory Visit (INDEPENDENT_AMBULATORY_CARE_PROVIDER_SITE_OTHER): Payer: Medicare Other | Admitting: Family Medicine

## 2018-02-15 ENCOUNTER — Other Ambulatory Visit: Payer: Self-pay

## 2018-02-15 VITALS — BP 120/78 | HR 94 | Temp 98.3°F | Ht 67.0 in | Wt 213.4 lb

## 2018-02-15 DIAGNOSIS — E785 Hyperlipidemia, unspecified: Secondary | ICD-10-CM | POA: Diagnosis not present

## 2018-02-15 DIAGNOSIS — I10 Essential (primary) hypertension: Secondary | ICD-10-CM

## 2018-02-15 DIAGNOSIS — Z0000 Encounter for general adult medical examination without abnormal findings: Secondary | ICD-10-CM

## 2018-02-15 DIAGNOSIS — J302 Other seasonal allergic rhinitis: Secondary | ICD-10-CM | POA: Diagnosis not present

## 2018-02-15 DIAGNOSIS — M1990 Unspecified osteoarthritis, unspecified site: Secondary | ICD-10-CM | POA: Diagnosis not present

## 2018-02-15 DIAGNOSIS — F17200 Nicotine dependence, unspecified, uncomplicated: Secondary | ICD-10-CM

## 2018-02-15 DIAGNOSIS — Z1211 Encounter for screening for malignant neoplasm of colon: Secondary | ICD-10-CM | POA: Diagnosis not present

## 2018-02-15 NOTE — Assessment & Plan Note (Addendum)
Due for colon cancer screening, patient has been doing yearly FOBT. - Provided with FOBT kit - CBC

## 2018-02-15 NOTE — Assessment & Plan Note (Signed)
Well-controlled on her diclofenac/lidocaine patches.  Follows with Dr. Berenice Primas. -Continue diclofenac/lidocaine patches - Continue restorative yoga and walking - Follow-up with Dr. Berenice Primas as appropriate

## 2018-02-15 NOTE — Assessment & Plan Note (Signed)
On atorvastatin 40 daily, endorses compliance.  Last LDL 184 in 2016. -Obtain lipid panel

## 2018-02-15 NOTE — Assessment & Plan Note (Signed)
Down to 4 cigarettes a day now.  Very motivated and excited for improvement.  Ensured patient to reach out if she needs any additional aide to help her continue with her smoking cessation. -Continue nicotine patches

## 2018-02-15 NOTE — Assessment & Plan Note (Signed)
Patient states that she can receive several free medications through her insurance, is going to try to see if there is any antihistamine that she can obtain for free through this service.  If not, will likely start her on Zyrtec to help with her allergies.  In addition, taught the patient sinus effleurage to help with congestion.  Recommended to continue her current regimen.

## 2018-02-15 NOTE — Patient Instructions (Signed)
It was so nice meeting you today!  Glad to hear you are doing so well.  Make sure that you try calling to see if you can get any free antihistamine/antiallergy medications for your insurance to help with your allergies.  We will also send you with your cecal colon cancer screening.  Lastly we are going to get some routine labs today, I will check the results of these either through mail or via the phone in the next 1-2 weeks.  I will be of a wonderful rest of your holiday and asking about 6 months to follow-up, sooner if needed.

## 2018-02-15 NOTE — Progress Notes (Signed)
Subjective:    Patient ID: Alexis Cole, female    DOB: 25-Nov-1945, 72 y.o.   MRN: [id number]   HPI: Alexis Cole is a 72 year old female that presented to discuss her chronic conditions as below:  Osteoarthritis: Follows with Dr. Berenice Primas for her orthopedic concerns.  States her pain is well managed with her diclofenac.  She is considering having surgery on her right knee, as her pain has become more progressive in the last few months.  Hypertension: Endorses compliance to her lisinopril and amlodipine daily.  Is having difficulty sticking to a healthy diet during the holidays, however is going to try working on adding more fruits, veggies, lean meats into her diet in the new year.  Walks daily with her dog, Museum/gallery conservator, for exercise.  She is also going to increase this in the new year in hopes for some weight loss.  Denies any lightheadedness, dizziness, lower extremity swelling.  Tobacco use: Started on nicotine patches in September, smoked 1/2-1 ppd at that time.  She is now down to 4 cigarettes a day.  Her goal is to transition to stage II around February 1.  She is very motivated to continue her smoking cessation, hoping she will never smoke again after this final attempt at quitting.  Allergies/chronic sinus congestion: She previously was seen by Dr. Pennie Rushing with complaints of dyspnea at that time, however she believes it was because she was congested.  No longer endorsing this.  However, she discusses that she chronically will be congested for a few months in the winter and in the spring.  She attributes this to allergies, states she is allergic to "everything "outdoors.  Endorses sinus/nasal congestion, postnasal drainage, watery eyes, and occasional sinus pressure.  Fortunately, states she is on uphill of her congestion and believes it will be better in the next few weeks.  She has been using saline nose flush daily, intermittent Mucinex, steam therapy.  Previously took Claritin-D for allergies,  but it was too expensive.  Smoking status reviewed  Review of Systems Per HPI, also denies recent illness, fever, headache, changes in vision, chest pain, shortness of breath, abdominal pain, N/V/D, weakness   Patient Active Problem List   Diagnosis Date Noted  . Primary osteoarthritis of left knee 03/09/2017  . Increased endometrial stripe thickness 06/13/2016  . Hyperparathyroidism, primary ([employer]) 04/27/2016  . Hyperlipidemia 01/04/2015  . Breast mass in female 08/20/2014  . Acute recurrent sinusitis 08/13/2014  . Healthcare maintenance 06/24/2014  . Obesity 05/07/2014  . Tobacco use disorder 04/24/2014  . Chronic meniscal tear of knee 04/17/2014  . Seasonal allergies 01/03/2013  . Osteoarthritis of right hip 09/01/2011  . Essential hypertension, benign 03/14/2011  . ANXIETY DEPRESSION 02/10/2009  . LOW BACK PAIN, CHRONIC 01/08/2008  . Arthritis 07/02/2007     Objective:  BP 120/78   Pulse 94   Temp 98.3 F (36.8 C) (Oral)   Ht '5\' 7"'  (1.702 m)   Wt 213 lb 6.4 oz (96.8 kg)   SpO2 97%   BMI 33.42 kg/m  Vitals and nursing note reviewed  General: NAD, pleasant HEENT: Tenderness to mastoid sinuses bilaterally.  Postnasal drainage noted in pharynx, throat nonerythematous.  Cardiac: RRR, normal heart sounds, no murmurs Respiratory: CTAB, normal effort Abdomen: soft, nontender, nondistended Extremities: no edema or cyanosis. WWP. Skin: warm and dry, no rashes noted Neuro: alert and oriented, no focal deficits Psych: normal affect  Assessment & Plan:    Arthritis Well-controlled on her diclofenac/lidocaine patches.  Follows with  Dr. Berenice Primas. -Continue diclofenac/lidocaine patches - Continue restorative yoga and walking - Follow-up with Dr. Berenice Primas as appropriate  Essential hypertension, benign Well-controlled, BP 120/78 today. - Continue amlodipine and lisinopril - Obtain CMP - Continue working on a well-balanced diet and continue exercise as  above  Hyperlipidemia On atorvastatin 40 daily, endorses compliance.  Last LDL 184 in 2016. -Obtain lipid panel  Seasonal allergies Patient states that she can receive several free medications through her insurance, is going to try to see if there is any antihistamine that she can obtain for free through this service.  If not, will likely start her on Zyrtec to help with her allergies.  In addition, taught the patient sinus effleurage to help with congestion.  Recommended to continue her current regimen.   Healthcare maintenance Due for colon cancer screening, patient has been doing yearly FOBT. - Provided with FOBT kit - CBC  Tobacco use disorder Down to 4 cigarettes a day now.  Very motivated and excited for improvement.  Ensured patient to reach out if she needs any additional aide to help her continue with her smoking cessation. -Continue nicotine patches   Darrelyn Hillock, DO Family Medicine Resident PGY-1

## 2018-02-15 NOTE — Assessment & Plan Note (Signed)
Well-controlled, BP 120/78 today. - Continue amlodipine and lisinopril - Obtain CMP - Continue working on a well-balanced diet and continue exercise as above

## 2018-02-16 LAB — CBC WITH DIFFERENTIAL/PLATELET
BASOS ABS: 0.1 10*3/uL (ref 0.0–0.2)
Basos: 1 %
EOS (ABSOLUTE): 0.3 10*3/uL (ref 0.0–0.4)
EOS: 3 %
HEMATOCRIT: 39.9 % (ref 34.0–46.6)
HEMOGLOBIN: 13.6 g/dL (ref 11.1–15.9)
Immature Grans (Abs): 0 10*3/uL (ref 0.0–0.1)
Immature Granulocytes: 0 %
LYMPHS ABS: 4.3 10*3/uL — AB (ref 0.7–3.1)
Lymphs: 43 %
MCH: 30.8 pg (ref 26.6–33.0)
MCHC: 34.1 g/dL (ref 31.5–35.7)
MCV: 90 fL (ref 79–97)
MONOCYTES: 8 %
MONOS ABS: 0.8 10*3/uL (ref 0.1–0.9)
NEUTROS ABS: 4.5 10*3/uL (ref 1.4–7.0)
Neutrophils: 45 %
Platelets: 262 10*3/uL (ref 150–450)
RBC: 4.42 x10E6/uL (ref 3.77–5.28)
RDW: 12.3 % (ref 12.3–15.4)
WBC: 9.9 10*3/uL (ref 3.4–10.8)

## 2018-02-16 LAB — LIPID PANEL
CHOL/HDL RATIO: 3.7 ratio (ref 0.0–4.4)
Cholesterol, Total: 169 mg/dL (ref 100–199)
HDL: 46 mg/dL (ref >39)
LDL CALC: 95 mg/dL (ref 0–99)
TRIGLYCERIDES: 138 mg/dL (ref 0–149)
VLDL Cholesterol Cal: 28 mg/dL (ref 5–40)

## 2018-02-16 LAB — COMPREHENSIVE METABOLIC PANEL
ALBUMIN: 4.6 g/dL (ref 3.5–4.8)
ALK PHOS: 117 IU/L (ref 39–117)
ALT: 14 IU/L (ref 0–32)
AST: 17 IU/L (ref 0–40)
Albumin/Globulin Ratio: 2.2 (ref 1.2–2.2)
BILIRUBIN TOTAL: 0.3 mg/dL (ref 0.0–1.2)
BUN / CREAT RATIO: 16 (ref 12–28)
BUN: 13 mg/dL (ref 8–27)
CHLORIDE: 102 mmol/L (ref 96–106)
CO2: 21 mmol/L (ref 20–29)
Calcium: 10.2 mg/dL (ref 8.7–10.3)
Creatinine, Ser: 0.79 mg/dL (ref 0.57–1.00)
GFR calc non Af Amer: 76 mL/min/{1.73_m2} (ref >59)
GFR, EST AFRICAN AMERICAN: 87 mL/min/{1.73_m2} (ref >59)
GLOBULIN, TOTAL: 2.1 g/dL (ref 1.5–4.5)
Glucose: 98 mg/dL (ref 65–99)
Potassium: 4.2 mmol/L (ref 3.5–5.2)
SODIUM: 139 mmol/L (ref 134–144)
TOTAL PROTEIN: 6.7 g/dL (ref 6.0–8.5)

## 2018-02-21 DIAGNOSIS — M47816 Spondylosis without myelopathy or radiculopathy, lumbar region: Secondary | ICD-10-CM | POA: Diagnosis not present

## 2018-02-22 ENCOUNTER — Encounter: Payer: Self-pay | Admitting: Family Medicine

## 2018-02-25 DIAGNOSIS — M25562 Pain in left knee: Secondary | ICD-10-CM | POA: Diagnosis not present

## 2018-02-25 DIAGNOSIS — M25552 Pain in left hip: Secondary | ICD-10-CM | POA: Diagnosis not present

## 2018-02-25 DIAGNOSIS — M25561 Pain in right knee: Secondary | ICD-10-CM | POA: Diagnosis not present

## 2018-02-25 DIAGNOSIS — M1711 Unilateral primary osteoarthritis, right knee: Secondary | ICD-10-CM | POA: Diagnosis not present

## 2018-03-21 ENCOUNTER — Other Ambulatory Visit: Payer: Self-pay

## 2018-03-21 DIAGNOSIS — M1612 Unilateral primary osteoarthritis, left hip: Secondary | ICD-10-CM | POA: Diagnosis not present

## 2018-03-21 DIAGNOSIS — F341 Dysthymic disorder: Secondary | ICD-10-CM

## 2018-03-22 MED ORDER — ESCITALOPRAM OXALATE 5 MG PO TABS: 5.0000 mg | ORAL_TABLET | Freq: Every day | ORAL | 2 refills | Status: DC

## 2018-03-26 ENCOUNTER — Other Ambulatory Visit: Payer: Self-pay

## 2018-03-26 DIAGNOSIS — I10 Essential (primary) hypertension: Secondary | ICD-10-CM

## 2018-03-26 MED ORDER — AMLODIPINE BESYLATE 10 MG PO TABS: 10.0000 mg | ORAL_TABLET | Freq: Every day | ORAL | 3 refills | Status: DC

## 2018-03-30 ENCOUNTER — Other Ambulatory Visit: Payer: Self-pay | Admitting: Family Medicine

## 2018-03-30 DIAGNOSIS — R0600 Dyspnea, unspecified: Secondary | ICD-10-CM

## 2018-04-02 DIAGNOSIS — M1612 Unilateral primary osteoarthritis, left hip: Secondary | ICD-10-CM | POA: Diagnosis not present

## 2018-04-08 ENCOUNTER — Other Ambulatory Visit: Payer: Self-pay | Admitting: Orthopedic Surgery

## 2018-04-22 ENCOUNTER — Other Ambulatory Visit: Payer: Self-pay

## 2018-04-22 DIAGNOSIS — H353221 Exudative age-related macular degeneration, left eye, with active choroidal neovascularization: Secondary | ICD-10-CM | POA: Diagnosis not present

## 2018-04-22 DIAGNOSIS — H353112 Nonexudative age-related macular degeneration, right eye, intermediate dry stage: Secondary | ICD-10-CM | POA: Diagnosis not present

## 2018-04-22 MED ORDER — ATORVASTATIN CALCIUM 40 MG PO TABS: 40.0000 mg | ORAL_TABLET | Freq: Every day | ORAL | 3 refills | Status: DC

## 2018-04-30 ENCOUNTER — Ambulatory Visit: Payer: Medicare Other | Admitting: Family Medicine

## 2018-04-30 NOTE — Progress Notes (Deleted)
   Subjective:    Patient ID: Alexis Cole, female    DOB: 1946-02-05, 73 y.o.   MRN: [id number]   CC: sinus infection   HPI: Alexis Cole is a 73 yo female presenting to discuss the following:   Sinus infection:   Smoking still?    Smoking status reviewed  Review of Systems Per HPI, also denies recent illness, fever, headache, changes in vision, chest pain, shortness of breath, abdominal pain, N/V/D, weakness   Patient Active Problem List   Diagnosis Date Noted  . Primary osteoarthritis of left knee 03/09/2017  . Increased endometrial stripe thickness 06/13/2016  . Hyperparathyroidism, primary ([place]) 04/27/2016  . Hyperlipidemia 01/04/2015  . Breast mass in female 08/20/2014  . Acute recurrent sinusitis 08/13/2014  . Healthcare maintenance 06/24/2014  . Obesity 05/07/2014  . Tobacco use disorder 04/24/2014  . Chronic meniscal tear of knee 04/17/2014  . Seasonal allergies 01/03/2013  . Osteoarthritis of right hip 09/01/2011  . Essential hypertension, benign 03/14/2011  . ANXIETY DEPRESSION 02/10/2009  . LOW BACK PAIN, CHRONIC 01/08/2008  . Arthritis 07/02/2007     Objective:  There were no vitals taken for this visit. Vitals and nursing note reviewed  General: NAD, pleasant Cardiac: RRR, normal heart sounds, no murmurs Respiratory: CTAB, normal effort Abdomen: soft, nontender, nondistended Extremities: no edema or cyanosis. WWP. Skin: warm and dry, no rashes noted Neuro: alert and oriented, no focal deficits Psych: normal affect  Assessment & Plan:    No problem-specific Assessment & Plan notes found for this encounter.    Darrelyn Hillock, DO Family Medicine Resident PGY-1

## 2018-05-01 DIAGNOSIS — H353221 Exudative age-related macular degeneration, left eye, with active choroidal neovascularization: Secondary | ICD-10-CM | POA: Diagnosis not present

## 2018-05-01 DIAGNOSIS — H353211 Exudative age-related macular degeneration, right eye, with active choroidal neovascularization: Secondary | ICD-10-CM | POA: Diagnosis not present

## 2018-05-03 ENCOUNTER — Ambulatory Visit (INDEPENDENT_AMBULATORY_CARE_PROVIDER_SITE_OTHER): Payer: Medicare Other | Admitting: Family Medicine

## 2018-05-03 ENCOUNTER — Other Ambulatory Visit: Payer: Self-pay

## 2018-05-03 VITALS — BP 110/66 | HR 83 | Temp 98.2°F | Ht 67.0 in | Wt 208.0 lb

## 2018-05-03 DIAGNOSIS — J329 Chronic sinusitis, unspecified: Secondary | ICD-10-CM | POA: Diagnosis not present

## 2018-05-03 DIAGNOSIS — B9789 Other viral agents as the cause of diseases classified elsewhere: Secondary | ICD-10-CM | POA: Diagnosis not present

## 2018-05-03 NOTE — Patient Instructions (Signed)
Alexis Cole  05/03/2018   Your procedure is scheduled on: 05-13-18    Report to [facility] Main  Entrance    Report to Admitting at 9:45 AM    Call this number if you have problems the morning of surgery [phone]    Remember: Do not eat food or drink liquids :After Midnight.    BRUSH YOUR TEETH MORNING OF SURGERY AND RINSE YOUR MOUTH OUT, NO CHEWING GUM CANDY OR MINTS.     Take these medicines the morning of surgery with A SIP OF WATER: Amlodipine ( Norvasc), Atorvastatin (Lipitor), Buspirone (Buspar), and Escitalopram (Lexapro). You may also use and bring your nasal spray as needed.                               You may not have any metal on your body including hair pins and              piercings  Do not wear jewelry, make-up, lotions, powders or perfumes, deodorant             Do not wear nail polish.  Do not shave  48 hours prior to surgery.               Do not bring valuables to the hospital. [place].  Contacts, dentures or bridgework may not be worn into surgery.  Leave suitcase in the car. After surgery it may be brought to your room.     Patients discharged the day of surgery will not be allowed to drive home. IF YOU ARE HAVING SURGERY AND GOING HOME THE SAME DAY, YOU MUST HAVE AN ADULT TO DRIVE YOU HOME AND BE WITH YOU FOR 24 HOURS. YOU MAY GO HOME BY TAXI OR UBER OR ORTHERWISE, BUT AN ADULT MUST ACCOMPANY YOU HOME AND STAY WITH YOU FOR 24 HOURS.    Special Instructions: N/A              Please read over the following fact sheets you were given: _____________________________________________________________________             [facility] - Preparing for Surgery Before surgery, you can play an important role.  Because skin is not sterile, your skin needs to be as free of germs as possible.  You can reduce the number of germs on your skin by washing with CHG (chlorahexidine gluconate)  soap before surgery.  CHG is an antiseptic cleaner which kills germs and bonds with the skin to continue killing germs even after washing. Please DO NOT use if you have an allergy to CHG or antibacterial soaps.  If your skin becomes reddened/irritated stop using the CHG and inform your nurse when you arrive at Short Stay. Do not shave (including legs and underarms) for at least 48 hours prior to the first CHG shower.  You may shave your face/neck. Please follow these instructions carefully:  1.  Shower with CHG Soap the night before surgery and the  morning of Surgery.  2.  If you choose to wash your hair, wash your hair first as usual with your  normal  shampoo.  3.  After you shampoo, rinse your hair and body thoroughly to remove the  shampoo.  4.  Use CHG as you would any other liquid soap.  You can apply chg directly  to the skin and wash                       Gently with a scrungie or clean washcloth.  5.  Apply the CHG Soap to your body ONLY FROM THE NECK DOWN.   Do not use on face/ open                           Wound or open sores. Avoid contact with eyes, ears mouth and genitals (private parts).                       Wash face,  Genitals (private parts) with your normal soap.             6.  Wash thoroughly, paying special attention to the area where your surgery  will be performed.  7.  Thoroughly rinse your body with warm water from the neck down.  8.  DO NOT shower/wash with your normal soap after using and rinsing off  the CHG Soap.                9.  Pat yourself dry with a clean towel.            10.  Wear clean pajamas.            11.  Place clean sheets on your bed the night of your first shower and do not  sleep with pets. Day of Surgery : Do not apply any lotions/deodorants the morning of surgery.  Please wear clean clothes to the hospital/surgery center.  FAILURE TO FOLLOW THESE INSTRUCTIONS MAY RESULT IN THE CANCELLATION OF YOUR SURGERY PATIENT  SIGNATURE_________________________________  NURSE SIGNATURE__________________________________  ________________________________________________________________________   Adam Phenix  An incentive spirometer is a tool that can help keep your lungs clear and active. This tool measures how well you are filling your lungs with each breath. Taking long deep breaths may help reverse or decrease the chance of developing breathing (pulmonary) problems (especially infection) following:  A long period of time when you are unable to move or be active. BEFORE THE PROCEDURE   If the spirometer includes an indicator to show your best effort, your nurse or respiratory therapist will set it to a desired goal.  If possible, sit up straight or lean slightly forward. Try not to slouch.  Hold the incentive spirometer in an upright position. INSTRUCTIONS FOR USE  1. Sit on the edge of your bed if possible, or sit up as far as you can in bed or on a chair. 2. Hold the incentive spirometer in an upright position. 3. Breathe out normally. 4. Place the mouthpiece in your mouth and seal your lips tightly around it. 5. Breathe in slowly and as deeply as possible, raising the piston or the ball toward the top of the column. 6. Hold your breath for 3-5 seconds or for as long as possible. Allow the piston or ball to fall to the bottom of the column. 7. Remove the mouthpiece from your mouth and breathe out normally. 8. Rest for a few seconds and repeat Steps 1 through 7 at least 10 times every 1-2 hours when you are awake. Take your time and take a few normal breaths between deep breaths. 9. The spirometer may include an indicator to show  your best effort. Use the indicator as a goal to work toward during each repetition. 10. After each set of 10 deep breaths, practice coughing to be sure your lungs are clear. If you have an incision (the cut made at the time of surgery), support your incision when coughing  by placing a pillow or rolled up towels firmly against it. Once you are able to get out of bed, walk around indoors and cough well. You may stop using the incentive spirometer when instructed by your caregiver.  RISKS AND COMPLICATIONS  Take your time so you do not get dizzy or light-headed.  If you are in pain, you may need to take or ask for pain medication before doing incentive spirometry. It is harder to take a deep breath if you are having pain. AFTER USE  Rest and breathe slowly and easily.  It can be helpful to keep track of a log of your progress. Your caregiver can provide you with a simple table to help with this. If you are using the spirometer at home, follow these instructions: [place] IF:   You are having difficultly using the spirometer.  You have trouble using the spirometer as often as instructed.  Your pain medication is not giving enough relief while using the spirometer.  You develop fever of 100.5 F (38.1 C) or higher. SEEK IMMEDIATE MEDICAL CARE IF:   You cough up bloody sputum that had not been present before.  You develop fever of 102 F (38.9 C) or greater.  You develop worsening pain at or near the incision site. MAKE SURE YOU:   Understand these instructions.  Will watch your condition.  Will get help right away if you are not doing well or get worse. Document Released: 06/19/2006 Document Revised: 05/01/2011 Document Reviewed: 08/20/2006 ExitCare Patient Information 2014 ExitCare, [place].   ________________________________________________________________________  WHAT IS A BLOOD TRANSFUSION? Blood Transfusion Information  A transfusion is the replacement of blood or some of its parts. Blood is made up of multiple cells which provide different functions.  Red blood cells carry oxygen and are used for blood loss replacement.  White blood cells fight against infection.  Platelets control bleeding.  Plasma helps clot  blood.  Other blood products are available for specialized needs, such as hemophilia or other clotting disorders. BEFORE THE TRANSFUSION  Who gives blood for transfusions?   Healthy volunteers who are fully evaluated to make sure their blood is safe. This is blood bank blood. Transfusion therapy is the safest it has ever been in the practice of medicine. Before blood is taken from a donor, a complete history is taken to make sure that person has no history of diseases nor engages in risky social behavior (examples are intravenous drug use or sexual activity with multiple partners). The donor's travel history is screened to minimize risk of transmitting infections, such as malaria. The donated blood is tested for signs of infectious diseases, such as HIV and hepatitis. The blood is then tested to be sure it is compatible with you in order to minimize the chance of a transfusion reaction. If you or a relative donates blood, this is often done in anticipation of surgery and is not appropriate for emergency situations. It takes many days to process the donated blood. RISKS AND COMPLICATIONS Although transfusion therapy is very safe and saves many lives, the main dangers of transfusion include:   Getting an infectious disease.  Developing a transfusion reaction. This is an allergic reaction to  something in the blood you were given. Every precaution is taken to prevent this. The decision to have a blood transfusion has been considered carefully by your caregiver before blood is given. Blood is not given unless the benefits outweigh the risks. AFTER THE TRANSFUSION  Right after receiving a blood transfusion, you will usually feel much better and more energetic. This is especially true if your red blood cells have gotten low (anemic). The transfusion raises the level of the red blood cells which carry oxygen, and this usually causes an energy increase.  The nurse administering the transfusion will monitor  you carefully for complications. HOME CARE INSTRUCTIONS  No special instructions are needed after a transfusion. You may find your energy is better. Speak with your caregiver about any limitations on activity for underlying diseases you may have. SEEK MEDICAL CARE IF:   Your condition is not improving after your transfusion.  You develop redness or irritation at the intravenous (IV) site. SEEK IMMEDIATE MEDICAL CARE IF:  Any of the following symptoms occur over the next 12 hours:  Shaking chills.  You have a temperature by mouth above 102 F (38.9 C), not controlled by medicine.  Chest, back, or muscle pain.  People around you feel you are not acting correctly or are confused.  Shortness of breath or difficulty breathing.  Dizziness and fainting.  You get a rash or develop hives.  You have a decrease in urine output.  Your urine turns a dark color or changes to pink, red, or brown. Any of the following symptoms occur over the next 10 days:  You have a temperature by mouth above 102 F (38.9 C), not controlled by medicine.  Shortness of breath.  Weakness after normal activity.  The white part of the eye turns yellow (jaundice).  You have a decrease in the amount of urine or are urinating less often.  Your urine turns a dark color or changes to pink, red, or brown. Document Released: 02/04/2000 Document Revised: 05/01/2011 Document Reviewed: 09/23/2007 [facility] Patient Information 2014 [place], [place].  _______________________________________________________________________

## 2018-05-03 NOTE — Patient Instructions (Signed)
You are recovering well from a viral sinusitis or from your allergies.  No need to antibiotics today.  Careful with the afrin as it can cause rebound congestion.   Mucinex and flonase are good.

## 2018-05-03 NOTE — Progress Notes (Signed)
    Subjective:  Alexis Cole is a 73 y.o. female who presents to the [facility] today with a chief complaint of sinus issue.   HPI:  Has been dealing with sinus issues for the last 1 month.  She states that this started with environmental pollen flaring up her allergies causing nasal congestion.  She has been using Nettie pot and Afrin as well as Flonase for this.  She felt like it got worse about 2 weeks ago where she was coughing up some greenish phlegm and blowing out yellow-green rhinorrhea.  This has since significantly improved over the last 3 or 4 days now her sputum is only in the mornings and it is clear.  She has had no fevers, chills, shortness of breath, wheezing, nausea, vomiting, diarrhea.  ROS: Per HPI   Objective:  Physical Exam: BP 110/66   Pulse 83   Temp 98.2 F (36.8 C) (Oral)   Ht 5\' 7"  (1.702 m)   Wt 208 lb (94.3 kg)   SpO2 93%   BMI 32.58 kg/m   Gen: NAD, resting comfortably HEENT: [place], AT. TMs pearly bilaterally. Nasal mucosa slightly boggy on L but no purulence. Maxillary sinuses slightly TTP. Oropharynx nonerythematous CV: RRR with no murmurs appreciated Pulm: NWOB, CTAB with no crackles, wheezes, or rhonchi GI: Normal bowel sounds present. Soft, Nontender, Nondistended. Skin: warm, dry Neuro: grossly normal, moves all extremities Psych: Normal affect and thought content   Assessment/Plan:  1. Viral sinusitis Patient is afebrile, well-appearing.  She is reporting symptomatic improvement over the last 3 or 4 days.  Likely a viral sinusitis that is self resolving.  She has no signs of bacterial illness.  Cautioned against long-term use of Afrin for possible rebound congestion.  Recommended continued symptomatic management at home with Flonase, Mucinex, and good p.o. hydration.  Discussed return precautions including double worsening and SOB.   Bufford Lope, DO PGY-3, [facility] 05/03/2018 10:04 AM

## 2018-05-06 ENCOUNTER — Ambulatory Visit (HOSPITAL_COMMUNITY)
Admission: RE | Admit: 2018-05-06 | Discharge: 2018-05-06 | Disposition: A | Discharge status: Home / Self Care | Payer: Medicare Other | Source: Ambulatory Visit | Attending: Orthopedic Surgery | Admitting: Orthopedic Surgery

## 2018-05-06 ENCOUNTER — Encounter (HOSPITAL_COMMUNITY): Payer: Self-pay

## 2018-05-06 ENCOUNTER — Other Ambulatory Visit: Payer: Self-pay

## 2018-05-06 ENCOUNTER — Encounter (HOSPITAL_COMMUNITY)
Admission: RE | Admit: 2018-05-06 | Discharge: 2018-05-06 | Disposition: A | Discharge status: Home / Self Care | Payer: Medicare Other | Source: Ambulatory Visit | Attending: Orthopedic Surgery | Admitting: Orthopedic Surgery

## 2018-05-06 DIAGNOSIS — Z01811 Encounter for preprocedural respiratory examination: Secondary | ICD-10-CM

## 2018-05-06 DIAGNOSIS — I10 Essential (primary) hypertension: Secondary | ICD-10-CM

## 2018-05-06 DIAGNOSIS — M1612 Unilateral primary osteoarthritis, left hip: Secondary | ICD-10-CM | POA: Insufficient documentation

## 2018-05-06 LAB — CBC WITH DIFFERENTIAL/PLATELET
Abs Immature Granulocytes: 0.02 10*3/uL (ref 0.00–0.07)
Basophils Absolute: 0.1 10*3/uL (ref 0.0–0.1)
Basophils Relative: 1 %
Eosinophils Absolute: 0.1 10*3/uL (ref 0.0–0.5)
Eosinophils Relative: 1 %
HCT: 41.6 % (ref 36.0–46.0)
Hemoglobin: 13.4 g/dL (ref 12.0–15.0)
IMMATURE GRANULOCYTES: 0 %
LYMPHS PCT: 36 %
Lymphs Abs: 3 10*3/uL (ref 0.7–4.0)
MCH: 30.6 pg (ref 26.0–34.0)
MCHC: 32.2 g/dL (ref 30.0–36.0)
MCV: 95 fL (ref 80.0–100.0)
Monocytes Absolute: 0.7 10*3/uL (ref 0.1–1.0)
Monocytes Relative: 8 %
NEUTROS ABS: 4.6 10*3/uL (ref 1.7–7.7)
Neutrophils Relative %: 54 %
Platelets: 276 10*3/uL (ref 150–400)
RBC: 4.38 MIL/uL (ref 3.87–5.11)
RDW: 13.2 % (ref 11.5–15.5)
WBC: 8.5 10*3/uL (ref 4.0–10.5)
nRBC: 0 % (ref 0.0–0.2)

## 2018-05-06 LAB — URINALYSIS, ROUTINE W REFLEX MICROSCOPIC
Glucose, UA: NEGATIVE mg/dL
Hgb urine dipstick: NEGATIVE
Ketones, ur: 5 mg/dL — AB
Leukocytes,Ua: NEGATIVE
Nitrite: NEGATIVE
PROTEIN: NEGATIVE mg/dL
Specific Gravity, Urine: 1.018 (ref 1.005–1.030)
pH: 5 (ref 5.0–8.0)

## 2018-05-06 LAB — COMPREHENSIVE METABOLIC PANEL
ALT: 15 U/L (ref 0–44)
AST: 18 U/L (ref 15–41)
Albumin: 4.2 g/dL (ref 3.5–5.0)
Alkaline Phosphatase: 109 U/L (ref 38–126)
Anion gap: 11 (ref 5–15)
BUN: 15 mg/dL (ref 8–23)
CO2: 25 mmol/L (ref 22–32)
Calcium: 10.2 mg/dL (ref 8.9–10.3)
Chloride: 102 mmol/L (ref 98–111)
Creatinine, Ser: 0.83 mg/dL (ref 0.44–1.00)
GFR calc Af Amer: >60 mL/min (ref >60)
GFR calc non Af Amer: >60 mL/min (ref >60)
Glucose, Bld: 126 mg/dL — ABNORMAL HIGH (ref 70–99)
Potassium: 4.7 mmol/L (ref 3.5–5.1)
SODIUM: 138 mmol/L (ref 135–145)
Total Bilirubin: 0.6 mg/dL (ref 0.3–1.2)
Total Protein: 7.7 g/dL (ref 6.5–8.1)

## 2018-05-06 LAB — PROTIME-INR
INR: 0.9 (ref 0.8–1.2)
Prothrombin Time: 11.9 seconds (ref 11.4–15.2)

## 2018-05-06 LAB — SURGICAL PCR SCREEN
MRSA, PCR: NEGATIVE
Staphylococcus aureus: NEGATIVE

## 2018-05-06 LAB — APTT: aPTT: 29 seconds (ref 24–36)

## 2018-05-06 MED ORDER — LISINOPRIL 20 MG PO TABS: 20.0000 mg | ORAL_TABLET | Freq: Every day | ORAL | 1 refills | Status: DC

## 2018-05-13 ENCOUNTER — Ambulatory Visit (HOSPITAL_COMMUNITY): Admission: RE | Admit: 2018-05-13 | Payer: Medicare Other | Source: Home / Self Care | Admitting: Orthopedic Surgery

## 2018-05-13 ENCOUNTER — Encounter (HOSPITAL_COMMUNITY): Admission: RE | Payer: Self-pay | Source: Home / Self Care

## 2018-05-13 LAB — TYPE AND SCREEN
ABO/RH(D): O POS
Antibody Screen: NEGATIVE

## 2018-05-13 SURGERY — ARTHROPLASTY, HIP, TOTAL, ANTERIOR APPROACH
Anesthesia: Spinal | Laterality: Left

## 2018-05-30 ENCOUNTER — Other Ambulatory Visit: Payer: Self-pay

## 2018-05-30 DIAGNOSIS — F341 Dysthymic disorder: Secondary | ICD-10-CM

## 2018-05-30 MED ORDER — BUSPIRONE HCL 10 MG PO TABS: 10.0000 mg | ORAL_TABLET | Freq: Two times a day (BID) | ORAL | 3 refills | Status: DC

## 2018-06-03 ENCOUNTER — Other Ambulatory Visit: Payer: Self-pay | Admitting: *Deleted

## 2018-06-03 DIAGNOSIS — G4700 Insomnia, unspecified: Secondary | ICD-10-CM

## 2018-06-03 MED ORDER — GABAPENTIN 300 MG PO CAPS: 300.0000 mg | ORAL_CAPSULE | Freq: Three times a day (TID) | ORAL | 3 refills | Status: DC

## 2018-06-04 ENCOUNTER — Other Ambulatory Visit: Payer: Self-pay | Admitting: Family Medicine

## 2018-06-04 DIAGNOSIS — G4700 Insomnia, unspecified: Secondary | ICD-10-CM

## 2018-06-04 NOTE — Telephone Encounter (Signed)
Pt called to get refill on her gabapentin (NEURONTIN). Please give pt a call back.

## 2018-06-05 DIAGNOSIS — H353221 Exudative age-related macular degeneration, left eye, with active choroidal neovascularization: Secondary | ICD-10-CM | POA: Diagnosis not present

## 2018-06-07 DIAGNOSIS — H353211 Exudative age-related macular degeneration, right eye, with active choroidal neovascularization: Secondary | ICD-10-CM | POA: Diagnosis not present

## 2018-06-11 DIAGNOSIS — M1712 Unilateral primary osteoarthritis, left knee: Secondary | ICD-10-CM | POA: Diagnosis not present

## 2018-06-11 DIAGNOSIS — M1711 Unilateral primary osteoarthritis, right knee: Secondary | ICD-10-CM | POA: Diagnosis not present

## 2018-06-11 DIAGNOSIS — M1612 Unilateral primary osteoarthritis, left hip: Secondary | ICD-10-CM | POA: Diagnosis not present

## 2018-06-25 ENCOUNTER — Other Ambulatory Visit: Payer: Self-pay

## 2018-06-25 DIAGNOSIS — G4700 Insomnia, unspecified: Secondary | ICD-10-CM

## 2018-06-28 ENCOUNTER — Other Ambulatory Visit: Payer: Self-pay

## 2018-06-28 ENCOUNTER — Telehealth (INDEPENDENT_AMBULATORY_CARE_PROVIDER_SITE_OTHER): Payer: Medicare Other | Admitting: Family Medicine

## 2018-06-28 DIAGNOSIS — R200 Anesthesia of skin: Secondary | ICD-10-CM | POA: Insufficient documentation

## 2018-06-28 DIAGNOSIS — I10 Essential (primary) hypertension: Secondary | ICD-10-CM

## 2018-06-28 DIAGNOSIS — F17200 Nicotine dependence, unspecified, uncomplicated: Secondary | ICD-10-CM

## 2018-06-28 HISTORY — DX: Anesthesia of skin: R20.0

## 2018-06-28 NOTE — Assessment & Plan Note (Signed)
Controlled on current regimen.  Creatinine WNL in 04/2018. - Continue home Norvasc and lisinopril

## 2018-06-28 NOTE — Assessment & Plan Note (Signed)
Continue to encourage cessation. 

## 2018-06-28 NOTE — Progress Notes (Signed)
[facility] Visit  Patient consented to have virtual visit. Method of visit: Telephone  Encounter participants: Patient: Alexis Cole - located at Home  Provider: Patriciaann Clan - located at [facility]  Others (if applicable): None   Chief Complaint: Med refill  HPI: Ms. Reddin is a 73 year old female with a history of severe osteoarthritis within her lumbar spine and hip presenting via telephone to discuss the following:   Saddle anesthesia: Surprisingly, incidentally came up in conversation about her total hip arthroplasty been canceled due to [place].  States she was very upset that it was canceled as she has severe pains in her left hip.  She has been using a walker to get around, however is been in bed mostly.  Approximately 2.5 weeks ago, she states she had sudden onset of saddle anesthesia (states within her groin and vaginal area) and numbness of her anterior thighs to approximately mid thigh.  Has not progressed since onset, remaining unchanged.  No associated urinary retention, bowel/bladder incontinence.  Does have back pain that is longstanding, not worse since numbness starting, the pain was previously burning in nature.  Denies any overt muscle weakness or falls, can still walk but with pain in her hips.  However, does endorse her feet feeling "funny "as the muscles on top feels stiff and feels like she cannot dorsiflex her feet as much.  When this first started, she says she talked with an orthopedist on call (cannot remember the name) who said this sounds like it was coming from her lumbar spine but did not provide any additional recommendations per her report.  She has been taking a low-dose hydrocodone, Aspercreme, and gabapentin for the pain associated with her hips.  Hypertension: BP at home generally ranging 409-811 systolic, highest reaching 130s in the afternoon which she thinks is likely related to her pain.  Taking her Norvasc and  lisinopril with compliance.  She has been trying to eat healthier, has lost 8 pounds since this effort.  Congratulated her on this.  Tobacco use: During this acute time where she is in more pain without her surgery, has started increasing her smoking.  Smoking 10-15 cigarettes, had almost quit back in December.   ROS: per HPI  Pertinent PMHx: Severe osteoarthritis    Exam:  General/psych: Pleasant, calm and engaging in conversation Respiratory: No increased work of breathing   Assessment/Plan:  Saddle anesthesia Pt reports a 2.5-week of nonprogressive saddle anesthesia with anterior thigh numbness, with associated development of poor foot dorsiflexion.  No associated urinary or bowel incontinence/retention, ataxia.  Known history of severe osteoarthritis/disc bulging within her lumbar region, previously receiving injections for the pain.  Concern for cauda equina syndrome, severe nerve impingement within lumbar region.  Discussed these concerns as above with the patient, including the possibility of irreversible damage occurring which could result in abnormalities with her bowel/bladder function, walking, sensations.  Recommended obtaining an MRI today or going to the ED for further evaluation now.  Patient adamantly refusing MRI today as she has other plans and is avoiding the ED "at all cost" due to [place].  As patient would be willing to get an MRI on Monday of next week, will schedule for this time.  Extensively discussed precautions to go to the ED immediately if she is unwilling to go at her current state, she voiced understanding. - Lumbar MRI w/o contrast stat - Continue current pain management at home with hydrocodone and gabapentin - Strict precautions  to present to the ED discussed - Follow-up after MRI results  Essential hypertension, benign Controlled on current regimen.  Creatinine WNL in 04/2018. - Continue home Norvasc and lisinopril  Tobacco use disorder Continue  to encourage cessation.    Time spent during visit with patient: 20 minutes

## 2018-06-28 NOTE — Assessment & Plan Note (Signed)
Pt reports a 2.5-week of nonprogressive saddle anesthesia with anterior thigh numbness, with associated development of poor foot dorsiflexion.  No associated urinary or bowel incontinence/retention, ataxia.  Known history of severe osteoarthritis/disc bulging within her lumbar region, previously receiving injections for the pain.  Concern for cauda equina syndrome, severe nerve impingement within lumbar region.  Discussed these concerns as above with the patient, including the possibility of irreversible damage occurring which could result in abnormalities with her bowel/bladder function, walking, sensations.  Recommended obtaining an MRI today or going to the ED for further evaluation now.  Patient adamantly refusing MRI today as she has other plans and is avoiding the ED "at all cost" due to [place].  As patient would be willing to get an MRI on Monday of next week, will schedule for this time.  Extensively discussed precautions to go to the ED immediately if she is unwilling to go at her current state, she voiced understanding. - Lumbar MRI w/o contrast stat - Continue current pain management at home with hydrocodone and gabapentin - Strict precautions to present to the ED discussed - Follow-up after MRI results

## 2018-07-03 ENCOUNTER — Telehealth: Payer: Self-pay

## 2018-07-03 ENCOUNTER — Other Ambulatory Visit: Payer: Self-pay

## 2018-07-03 ENCOUNTER — Ambulatory Visit
Admission: RE | Admit: 2018-07-03 | Discharge: 2018-07-03 | Disposition: A | Discharge status: Home / Self Care | Payer: Medicare Other | Source: Ambulatory Visit | Attending: Family Medicine | Admitting: Family Medicine

## 2018-07-03 ENCOUNTER — Other Ambulatory Visit: Payer: Self-pay | Admitting: Family Medicine

## 2018-07-03 DIAGNOSIS — M48061 Spinal stenosis, lumbar region without neurogenic claudication: Secondary | ICD-10-CM | POA: Diagnosis not present

## 2018-07-03 DIAGNOSIS — R200 Anesthesia of skin: Secondary | ICD-10-CM

## 2018-07-03 NOTE — Progress Notes (Signed)
Called patient to discuss the results of her MRI lumbar scan.  Let her know know that there are no emergent changes on her MRI that I can see and that her symptoms are likely coming from the upper portion of her lumbar spine (her lower back) at L1/L2 with a small area of disc protrusion into her epidural space not hitting her spinal cord, likely with some inflammation of her L1 nerve.  Did discuss MRI findings/case with Dr. Ardelia Mems prior to calling patient.  Reassured that her groin numbness is slowly improving (but still within her saddle region/anterior groin), w/ still no associated weakness, urinary/bowel incontinence.  Will place a neurosurgery referral for them to evaluate if any additional interventions need to be done with her continued saddle anesthesia, however with hopes that this will involute on its own prior to being seen. Patient also wanted to discuss her MRI findings with her orthopedic surgeon already very well known to her, stated we would be happy to send the MRI report and notes if he does not have access to epic.  Patient is already on a pain regimen (including opioids) and muscle relaxer for her severe osteoarthritis within her hips.  We rediscussed signs/symptoms prompting immediate evaluation by the ED.  Orders Placed This Encounter  Procedures  . Ambulatory referral to Neurosurgery    Referral Priority:   Routine    Referral Type:   Surgical    Referral Reason:   Specialty Services Required    Requested Specialty:   Neurosurgery    Number of Visits Requested:   [place], [place] PGY-1

## 2018-07-03 NOTE — Telephone Encounter (Signed)
Called patient to give results of MRI. There are no emergent changes on the MRI per Dr. Higinio Plan. Patient's symptoms  are likely coming from the upper portion of her lumbar spine (her lower back) at L1/L2 with a small area of disc protrusion into her epidural space not hitting her spinal cord, likely with some inflammation of her L1 nerve.  Alexis Cole, [place]

## 2018-07-12 DIAGNOSIS — H353231 Exudative age-related macular degeneration, bilateral, with active choroidal neovascularization: Secondary | ICD-10-CM | POA: Diagnosis not present

## 2018-07-12 DIAGNOSIS — H353211 Exudative age-related macular degeneration, right eye, with active choroidal neovascularization: Secondary | ICD-10-CM | POA: Diagnosis not present

## 2018-07-12 DIAGNOSIS — H353221 Exudative age-related macular degeneration, left eye, with active choroidal neovascularization: Secondary | ICD-10-CM | POA: Diagnosis not present

## 2018-07-16 DIAGNOSIS — M47816 Spondylosis without myelopathy or radiculopathy, lumbar region: Secondary | ICD-10-CM | POA: Diagnosis not present

## 2018-07-18 ENCOUNTER — Other Ambulatory Visit: Payer: Self-pay | Admitting: Orthopedic Surgery

## 2018-07-22 NOTE — Patient Instructions (Addendum)
Alexis Cole    Your procedure is scheduled on: Friday 07/26/2018   Report to [facility] Main  Entrance Report to admitting     at 0950  AM                YOU NEED TO HAVE A COVID 19 TEST ON_Today______ @_______ , THIS TEST MUST BE DONE BEFORE SURGERY, COME TO [place].      Call this number if you have problems the morning of surgery [phone]     Remember: Do not eat food  :After Midnight.    NO SOLID FOOD AFTER MIDNIGHT THE NIGHT PRIOR TO SURGERY . NOTHING BY MOUTH EXCEPT CLEAR LIQUIDS UNTIL 0430 .  PLEASE FINISH ENSURE DRINK PER SURGEON ORDER WHICH NEEDS TO BE COMPLETED AT  0430 am.   CLEAR LIQUID DIET   Foods Allowed                                                                     Foods Excluded  Coffee and tea, regular and decaf                             liquids that you cannot  Plain Jell-O in any flavor                                             see through such as: Fruit ices (not with fruit pulp)                                     milk, soups, orange juice  Iced Popsicles                                    All solid food Carbonated beverages, regular and diet                                    Cranberry, grape and apple juices Sports drinks like Gatorade Lightly seasoned clear broth or consume(fat free) Sugar, honey syrup  Sample Menu Breakfast                                Lunch                                     Supper Cranberry juice                    Beef broth                            Chicken broth  Jell-O                                     Grape juice                           Apple juice Coffee or tea                        Jell-O                                      Popsicle                                                Coffee or tea                        Coffee or tea  _____________________________________________________________________   BRUSH YOUR TEETH MORNING OF  SURGERY AND RINSE YOUR MOUTH OUT, NO CHEWING GUM CANDY OR MINTS.      Take these medicines the morning of surgery with A SIP OF WATER: Amlodipine (Norvasc), Atorvastatin (Lipitor), Buspirone (Buspar), Escitalopram (Lexapro), Gabapentin (Neurontin),   use Proair inhaler if needed and bring inhaler with you to the hospital                                 You may not have any metal on your body including hair pins and              piercings              Do not wear jewelry, make-up, lotions, powders or perfumes, deodorant             Do not wear nail polish.  Do not shave  48 hours prior to surgery.  .   Do not bring valuables to the hospital. [name].  Contacts, dentures or bridgework may not be worn into surgery.                   Please read over the following fact sheets you were given: _____________________________________________________________________             [facility] - Preparing for Surgery  Before surgery, you can play an important role.   Because skin is not sterile, your skin needs to be as free of germs as possible.  You can reduce the number of germs on your skin by washing with CHG (chlorahexidine gluconate) soap before surgery.  CHG is an antiseptic cleaner which kills germs and bonds with the skin to continue killing germs even after washing. Please DO NOT use if you have an allergy to CHG or antibacterial soaps.  If your skin becomes reddened/irritated stop using the CHG and inform your nurse when you arrive at Short Stay. Do not shave (including legs and underarms) for at least 48 hours prior to the first CHG shower.  You may shave your face/neck.  Please follow  these instructions carefully:  1.  Shower with CHG Soap the night before surgery and the  morning of Surgery.  2.  If you choose to wash your hair, wash your hair first as usual with your  normal  shampoo.  3.  After you shampoo, rinse your hair  and body thoroughly to remove the  shampoo.                                         4.  Use CHG as you would any other liquid soap.  You can apply chg directly  to the skin and wash                       Gently with a scrungie or clean washcloth.  5.  Apply the CHG Soap to your body ONLY FROM THE NECK DOWN.   Do not use on face/ open                           Wound or open sores. Avoid contact with eyes, ears mouth and genitals (private parts).                       Wash face,  Genitals (private parts) with your normal soap.             6.  Wash thoroughly, paying special attention to the area where your surgery  will be performed.  7.  Thoroughly rinse your body with warm water from the neck down.  8.  DO NOT shower/wash with your normal soap after using and rinsing off  the CHG Soap.                9.  Pat yourself dry with a clean towel.            10.  Wear clean pajamas.            11.  Place clean sheets on your bed the night of your first shower and do not  sleep with pets.  Day of Surgery : Do not apply any lotions/deodorants the morning of surgery.  Please wear clean clothes to the hospital/surgery center.  FAILURE TO FOLLOW THESE INSTRUCTIONS MAY RESULT IN THE CANCELLATION OF YOUR SURGERY PATIENT SIGNATURE_________________________________  NURSE SIGNATURE__________________________________  ________________________________________________________________________   Alexis Cole  An incentive spirometer is a tool that can help keep your lungs clear and active. This tool measures how well you are filling your lungs with each breath. Taking long deep breaths may help reverse or decrease the chance of developing breathing (pulmonary) problems (especially infection) following:  A long period of time when you are unable to move or be active. BEFORE THE PROCEDURE   If the spirometer includes an indicator to show your best effort, your nurse or respiratory therapist will set it  to a desired goal.  If possible, sit up straight or lean slightly forward. Try not to slouch.  Hold the incentive spirometer in an upright position. INSTRUCTIONS FOR USE  1. Sit on the edge of your bed if possible, or sit up as far as you can in bed or on a chair. 2. Hold the incentive spirometer in an upright position. 3. Breathe out normally. 4. Place the mouthpiece in your mouth and seal your lips tightly around it. 5.  Breathe in slowly and as deeply as possible, raising the piston or the ball toward the top of the column. 6. Hold your breath for 3-5 seconds or for as long as possible. Allow the piston or ball to fall to the bottom of the column. 7. Remove the mouthpiece from your mouth and breathe out normally. 8. Rest for a few seconds and repeat Steps 1 through 7 at least 10 times every 1-2 hours when you are awake. Take your time and take a few normal breaths between deep breaths. 9. The spirometer may include an indicator to show your best effort. Use the indicator as a goal to work toward during each repetition. 10. After each set of 10 deep breaths, practice coughing to be sure your lungs are clear. If you have an incision (the cut made at the time of surgery), support your incision when coughing by placing a pillow or rolled up towels firmly against it. Once you are able to get out of bed, walk around indoors and cough well. You may stop using the incentive spirometer when instructed by your caregiver.  RISKS AND COMPLICATIONS  Take your time so you do not get dizzy or light-headed.  If you are in pain, you may need to take or ask for pain medication before doing incentive spirometry. It is harder to take a deep breath if you are having pain. AFTER USE  Rest and breathe slowly and easily.  It can be helpful to keep track of a log of your progress. Your caregiver can provide you with a simple table to help with this. If you are using the spirometer at home, follow these  instructions: [place] IF:   You are having difficultly using the spirometer.  You have trouble using the spirometer as often as instructed.  Your pain medication is not giving enough relief while using the spirometer.  You develop fever of 100.5 F (38.1 C) or higher. SEEK IMMEDIATE MEDICAL CARE IF:   You cough up bloody sputum that had not been present before.  You develop fever of 102 F (38.9 C) or greater.  You develop worsening pain at or near the incision site. MAKE SURE YOU:   Understand these instructions.  Will watch your condition.  Will get help right away if you are not doing well or get worse. Document Released: 06/19/2006 Document Revised: 05/01/2011 Document Reviewed: 08/20/2006 [facility] Patient Information 2014 [place], [place].   ________________________________________________________________________

## 2018-07-22 NOTE — Progress Notes (Signed)
05/06/2018- noted in [place] and CXR  06/09/2016- noted in [facility]

## 2018-07-23 ENCOUNTER — Other Ambulatory Visit: Payer: Self-pay

## 2018-07-23 ENCOUNTER — Other Ambulatory Visit (HOSPITAL_COMMUNITY)
Admission: RE | Admit: 2018-07-23 | Discharge: 2018-07-23 | Disposition: A | Discharge status: Home / Self Care | Payer: Medicare Other | Source: Ambulatory Visit | Attending: Orthopedic Surgery | Admitting: Orthopedic Surgery

## 2018-07-23 ENCOUNTER — Encounter (HOSPITAL_COMMUNITY): Payer: Self-pay

## 2018-07-23 ENCOUNTER — Encounter (HOSPITAL_COMMUNITY)
Admission: RE | Admit: 2018-07-23 | Discharge: 2018-07-23 | Disposition: A | Discharge status: Home / Self Care | Payer: Medicare Other | Source: Ambulatory Visit | Attending: Orthopedic Surgery | Admitting: Orthopedic Surgery

## 2018-07-23 DIAGNOSIS — G8929 Other chronic pain: Secondary | ICD-10-CM | POA: Diagnosis not present

## 2018-07-23 DIAGNOSIS — Z7951 Long term (current) use of inhaled steroids: Secondary | ICD-10-CM | POA: Diagnosis not present

## 2018-07-23 DIAGNOSIS — Z01812 Encounter for preprocedural laboratory examination: Secondary | ICD-10-CM | POA: Insufficient documentation

## 2018-07-23 DIAGNOSIS — R011 Cardiac murmur, unspecified: Secondary | ICD-10-CM | POA: Diagnosis not present

## 2018-07-23 DIAGNOSIS — Z1159 Encounter for screening for other viral diseases: Secondary | ICD-10-CM | POA: Insufficient documentation

## 2018-07-23 DIAGNOSIS — F419 Anxiety disorder, unspecified: Secondary | ICD-10-CM | POA: Diagnosis not present

## 2018-07-23 DIAGNOSIS — Z8711 Personal history of peptic ulcer disease: Secondary | ICD-10-CM | POA: Diagnosis not present

## 2018-07-23 DIAGNOSIS — I10 Essential (primary) hypertension: Secondary | ICD-10-CM | POA: Diagnosis not present

## 2018-07-23 DIAGNOSIS — E210 Primary hyperparathyroidism: Secondary | ICD-10-CM | POA: Diagnosis not present

## 2018-07-23 DIAGNOSIS — G4700 Insomnia, unspecified: Secondary | ICD-10-CM | POA: Diagnosis not present

## 2018-07-23 DIAGNOSIS — Z7982 Long term (current) use of aspirin: Secondary | ICD-10-CM | POA: Diagnosis not present

## 2018-07-23 DIAGNOSIS — E739 Lactose intolerance, unspecified: Secondary | ICD-10-CM | POA: Diagnosis not present

## 2018-07-23 DIAGNOSIS — E669 Obesity, unspecified: Secondary | ICD-10-CM | POA: Diagnosis not present

## 2018-07-23 DIAGNOSIS — Z6833 Body mass index (BMI) 33.0-33.9, adult: Secondary | ICD-10-CM | POA: Diagnosis not present

## 2018-07-23 DIAGNOSIS — F329 Major depressive disorder, single episode, unspecified: Secondary | ICD-10-CM | POA: Diagnosis not present

## 2018-07-23 DIAGNOSIS — M5126 Other intervertebral disc displacement, lumbar region: Secondary | ICD-10-CM | POA: Diagnosis not present

## 2018-07-23 DIAGNOSIS — I700 Atherosclerosis of aorta: Secondary | ICD-10-CM | POA: Diagnosis not present

## 2018-07-23 DIAGNOSIS — E785 Hyperlipidemia, unspecified: Secondary | ICD-10-CM | POA: Diagnosis not present

## 2018-07-23 DIAGNOSIS — M4805 Spinal stenosis, thoracolumbar region: Secondary | ICD-10-CM | POA: Diagnosis not present

## 2018-07-23 DIAGNOSIS — M5136 Other intervertebral disc degeneration, lumbar region: Secondary | ICD-10-CM | POA: Diagnosis not present

## 2018-07-23 DIAGNOSIS — M1612 Unilateral primary osteoarthritis, left hip: Secondary | ICD-10-CM | POA: Insufficient documentation

## 2018-07-23 DIAGNOSIS — K760 Fatty (change of) liver, not elsewhere classified: Secondary | ICD-10-CM | POA: Diagnosis not present

## 2018-07-23 DIAGNOSIS — G43909 Migraine, unspecified, not intractable, without status migrainosus: Secondary | ICD-10-CM | POA: Diagnosis not present

## 2018-07-23 DIAGNOSIS — F4310 Post-traumatic stress disorder, unspecified: Secondary | ICD-10-CM | POA: Diagnosis not present

## 2018-07-23 DIAGNOSIS — M48061 Spinal stenosis, lumbar region without neurogenic claudication: Secondary | ICD-10-CM | POA: Diagnosis not present

## 2018-07-23 LAB — COMPREHENSIVE METABOLIC PANEL
ALT: 20 U/L (ref 0–44)
AST: 16 U/L (ref 15–41)
Albumin: 4.7 g/dL (ref 3.5–5.0)
Alkaline Phosphatase: 104 U/L (ref 38–126)
Anion gap: 8 (ref 5–15)
BUN: 15 mg/dL (ref 8–23)
CO2: 25 mmol/L (ref 22–32)
Calcium: 10 mg/dL (ref 8.9–10.3)
Chloride: 106 mmol/L (ref 98–111)
Creatinine, Ser: 0.72 mg/dL (ref 0.44–1.00)
GFR calc Af Amer: >60 mL/min (ref >60)
GFR calc non Af Amer: >60 mL/min (ref >60)
Glucose, Bld: 106 mg/dL — ABNORMAL HIGH (ref 70–99)
Potassium: 4.3 mmol/L (ref 3.5–5.1)
Sodium: 139 mmol/L (ref 135–145)
Total Bilirubin: 0.4 mg/dL (ref 0.3–1.2)
Total Protein: 7.9 g/dL (ref 6.5–8.1)

## 2018-07-23 LAB — CBC WITH DIFFERENTIAL/PLATELET
Abs Immature Granulocytes: 0.09 10*3/uL — ABNORMAL HIGH (ref 0.00–0.07)
Basophils Absolute: 0.1 10*3/uL (ref 0.0–0.1)
Basophils Relative: 1 %
Eosinophils Absolute: 0.2 10*3/uL (ref 0.0–0.5)
Eosinophils Relative: 1 %
HCT: 46.3 % — ABNORMAL HIGH (ref 36.0–46.0)
Hemoglobin: 14.6 g/dL (ref 12.0–15.0)
Immature Granulocytes: 1 %
Lymphocytes Relative: 36 %
Lymphs Abs: 6 10*3/uL — ABNORMAL HIGH (ref 0.7–4.0)
MCH: 29.9 pg (ref 26.0–34.0)
MCHC: 31.5 g/dL (ref 30.0–36.0)
MCV: 94.9 fL (ref 80.0–100.0)
Monocytes Absolute: 1.3 10*3/uL — ABNORMAL HIGH (ref 0.1–1.0)
Monocytes Relative: 8 %
Neutro Abs: 8.8 10*3/uL — ABNORMAL HIGH (ref 1.7–7.7)
Neutrophils Relative %: 53 %
Platelets: 275 10*3/uL (ref 150–400)
RBC: 4.88 MIL/uL (ref 3.87–5.11)
RDW: 13.6 % (ref 11.5–15.5)
WBC: 16.5 10*3/uL — ABNORMAL HIGH (ref 4.0–10.5)
nRBC: 0 % (ref 0.0–0.2)

## 2018-07-23 LAB — URINALYSIS, ROUTINE W REFLEX MICROSCOPIC
Bilirubin Urine: NEGATIVE
Glucose, UA: NEGATIVE mg/dL
Hgb urine dipstick: NEGATIVE
Ketones, ur: NEGATIVE mg/dL
Leukocytes,Ua: NEGATIVE
Nitrite: NEGATIVE
Protein, ur: NEGATIVE mg/dL
Specific Gravity, Urine: 1.004 — ABNORMAL LOW (ref 1.005–1.030)
pH: 6 (ref 5.0–8.0)

## 2018-07-23 LAB — PROTIME-INR
INR: 0.9 (ref 0.8–1.2)
Prothrombin Time: 12 seconds (ref 11.4–15.2)

## 2018-07-23 LAB — SURGICAL PCR SCREEN
MRSA, PCR: NEGATIVE
Staphylococcus aureus: NEGATIVE

## 2018-07-23 LAB — APTT: aPTT: 27 seconds (ref 24–36)

## 2018-07-24 LAB — NOVEL CORONAVIRUS, NAA (HOSP ORDER, SEND-OUT TO REF LAB; TAT 18-24 HRS): SARS-CoV-2, NAA: NOT DETECTED

## 2018-07-25 ENCOUNTER — Other Ambulatory Visit: Payer: Self-pay

## 2018-07-25 MED ORDER — BUPIVACAINE LIPOSOME 1.3 % IJ SUSP
20.0000 mL | INTRAMUSCULAR | Status: DC
  Filled 2018-07-25: qty 20

## 2018-07-25 NOTE — H&P (Addendum)
TOTAL HIP ADMISSION H&P  Patient is admitted for left total hip arthroplasty.  Subjective:  Chief Complaint: left hip pain  HPI: Alexis Cole, 73 y.o. female, has a history of pain and functional disability in the left hip(s) due to arthritis and patient has failed non-surgical conservative treatments for greater than 12 weeks to include NSAID's and/or analgesics, supervised PT with diminished ADL's post treatment, weight reduction as appropriate and activity modification.  Onset of symptoms was gradual starting 2 years ago with gradually worsening course since that time.The patient noted no past surgery on the left hip(s).  Patient currently rates pain in the left hip at 9 out of 10 with activity. Patient has night pain, worsening of pain with activity and weight bearing, trendelenberg gait, pain that interfers with activities of daily living and pain with passive range of motion. Patient has evidence of periarticular osteophytes by imaging studies. This condition presents safety issues increasing the risk of falls. This patient has had failure of allReasonable conservative care.  There is no current active infection.  Patient Active Problem List   Diagnosis Date Noted  . Saddle anesthesia 06/28/2018  . Primary osteoarthritis of left knee 03/09/2017  . Increased endometrial stripe thickness 06/13/2016  . Hyperparathyroidism, primary ([place]) 04/27/2016  . Hyperlipidemia 01/04/2015  . Breast mass in female 08/20/2014  . Acute recurrent sinusitis 08/13/2014  . Healthcare maintenance 06/24/2014  . Obesity 05/07/2014  . Tobacco use disorder 04/24/2014  . Chronic meniscal tear of knee 04/17/2014  . Seasonal allergies 01/03/2013  . Osteoarthritis of right hip 09/01/2011  . Essential hypertension, benign 03/14/2011  . ANXIETY DEPRESSION 02/10/2009  . LOW BACK PAIN, CHRONIC 01/08/2008  . Arthritis 07/02/2007   Past Medical History:  Diagnosis Date  . Acne    but not being treated for  .  Anemia    history of--many yrs ago  . Anxiety    but doesn't take any meds for this  . Aortic atherosclerosis ([place]) 06/08/2016   noted on CT abd/pelvis  . Arthritis   . Arthropathy, lower leg 02/10/2009   For eye care pt is going to Dr. Katy Fitch, and her Ortho surgeon is Dr. Dorna Leitz and [facility].    . Back pain 11/17/2015  . Chronic back pain   . Complication of anesthesia    States she woke up just after parathyroid surgery while still in OR and attempted to get off the bed, remebers seeing staffs back towards her  . Depression   . Diarrhea   . Diverticulitis 06/08/2016   noted on CT abd/pelvis  . Diverticulosis 06/08/2016   noted on CT abd/pelvis  . Dyslipidemia   . Dyspnea 10/26/2017  . Estrogen deficiency 08/13/2014  . Fatigue 02/24/2016  . Fatty liver 06/08/2016   severe noted on CT abd/pelvis   . Gastric ulcer    history of--many yrs ago  . General weakness 06/08/2016  . Headache(784.0)    History of migraine  . Heart murmur   . History of bronchitis    last time 21months  . History of syncope   . Hx of migraines    last one a month ago  . Hypercalcemia 12/12/2016  . Hyperparathyroidism ([place]) 12/12/2016  . Hypertension     Dr     Higinio Plan  . Insomnia    Nyquil nightly  . Joint pain   . Joint swelling   . Lactose intolerance   . Nausea and vomiting   . Obesity   . Pneumonia  03/15/2016   left lower lobe, left lower lobe atelectasis, elevated left hemidiaphragm noted again, small left pleural effusion  . Pre-syncope 04/12/2016  . PTSD (post-traumatic stress disorder)   . Sinusitis   . Urge incontinence of urine     Past Surgical History:  Procedure Laterality Date  . CATARACT EXTRACTION, BILATERAL    . CERVICAL CONE BIOPSY    . DILATION AND CURETTAGE OF UTERUS  30+yrs ago  . MOUTH SURGERY    . NASAL SEPTUM SURGERY  40+yrs ago  . PARATHYROIDECTOMY Left 01/04/2017   Procedure: LEFT SUPERIOR PARATHYROIDECTOMY;  Surgeon: Armandina Gemma, MD;  Location: [facility];   Service: General;  Laterality: Left;  . TOTAL HIP ARTHROPLASTY  09/01/2011   Procedure: TOTAL HIP ARTHROPLASTY;  Surgeon: [name], MD;  Location: [facility] [facility];  Service: Orthopedics;  Laterality: Right;  . TOTAL KNEE ARTHROPLASTY Left 03/09/2017   Procedure: LEFT TOTAL KNEE ARTHROPLASTY;  Surgeon: Dorna Leitz, MD;  Location: [facility];  Service: Orthopedics;  Laterality: Left;  . TUBAL LIGATION  30+yrs ago    Current Facility-Administered Medications  Medication Dose Route Frequency Provider Last Rate Last Dose  . [START ON 07/26/2018] bupivacaine liposome (EXPAREL) 1.3 % injection 266 mg  20 mL Other On Call to OR Dorna Leitz, MD       Current Outpatient Medications  Medication Sig Dispense Refill Last Dose  . amLODipine (NORVASC) 10 MG tablet Take 1 tablet (10 mg total) by mouth daily. 90 tablet 3   . atorvastatin (LIPITOR) 40 MG tablet Take 1 tablet (40 mg total) by mouth daily. 90 tablet 3   . BIOTIN PO Take 1 tablet by mouth at bedtime.     . busPIRone (BUSPAR) 10 MG tablet Take 1 tablet (10 mg total) by mouth 2 (two) times daily. 180 tablet 3   . Cholecalciferol (VITAMIN D3) 50 MCG (2000 UT) capsule Take 2,000 Units by mouth at bedtime.    Taking  . Coenzyme Q10 (CO Q-10) 100 MG CAPS Take 100 mg by mouth at bedtime.      Marland Kitchen escitalopram (LEXAPRO) 5 MG tablet Take 1 tablet (5 mg total) by mouth daily. 90 tablet 2   . fluticasone (FLONASE) 50 MCG/ACT nasal spray Place 1 spray into both nostrils daily as needed for allergies or rhinitis.     Marland Kitchen FOLIC ACID PO Take 1 tablet by mouth at bedtime.     . gabapentin (NEURONTIN) 300 MG capsule Take 1 capsule (300 mg total) by mouth 3 (three) times daily. 90 capsule 3   . HYDROcodone-acetaminophen (NORCO/VICODIN) 5-325 MG tablet Take 1 tablet by mouth 2 (two) times a day.     Javier Docker Oil 350 MG CAPS Take 350 mg by mouth at bedtime.      . Liniments (SALONPAS PAIN RELIEF PATCH EX) Place 1 patch onto the skin daily as needed (pain).     Marland Kitchen lisinopril  (PRINIVIL,ZESTRIL) 20 MG tablet Take 1 tablet (20 mg total) by mouth daily. 90 tablet 1   . Menthol, Topical Analgesic, (ASPERCREME MAX ROLL-ON) 16 % LIQD Apply 1 application topically daily as needed (pain).     . Multiple Vitamins-Minerals (HEALTHY EYES PO) Take 1 tablet by mouth at bedtime.     Marland Kitchen oxymetazoline (AFRIN) 0.05 % nasal spray Place 1 spray into both nostrils 2 (two) times daily as needed for congestion.     Marland Kitchen PROAIR HFA 108 (90 Base) MCG/ACT inhaler INHALE 2-4 PUFFS INTO THE LUNGS EVERY 4 (FOUR) HOURS  AS NEEDED FOR WHEEZING (OR COUGH). (Patient taking differently: Inhale 2 puffs into the lungs every 4 (four) hours as needed for wheezing or shortness of breath. ) 17 Inhaler 2   . Pseudoeph-Doxylamine-DM-APAP (NYQUIL PO) Take 30 mLs by mouth at bedtime as needed (allergies/insomnia).     Marland Kitchen etodolac (LODINE) 400 MG tablet Take 400 mg by mouth 2 (two) times daily as needed (pain.).      Allergies  Allergen Reactions  . Tape Rash    Social History   Tobacco Use  . Smoking status: Current Every Day Smoker    Packs/day: 0.50    Years: 15.00    Pack years: 7.50    Types: Cigarettes    Start date: 02/21/1968  . Smokeless tobacco: Never Used  . Tobacco comment: quit in the past for 9 years.  Previous 1ppd  Substance Use Topics  . Alcohol use: No    Alcohol/week: 0.0 standard drinks    Frequency: Never    Family History  Problem Relation Age of Onset  . Hypertension Mother   . Hyperlipidemia Neg Hx   . Heart attack Neg Hx   . Diabetes Neg Hx   . Sudden death Neg Hx      ROS ROS: I have reviewed the patient's review of systems thoroughly and there are no positive responses as relates to the HPI. Objective:  Physical Exam  Vital signs in last 24 hours:    Vitals:   07/26/18 0951  BP: 122/80  Pulse: 75  Resp: 18  Temp: 99 F (37.2 C)  SpO2: 99%   Well-developed well-nourished patient in no acute distress. Alert and oriented x3 HEENT:within normal limits Cardiac:  Regular rate and rhythm Pulmonary: Lungs clear to auscultation Abdomen: Soft and nontender.  Normal active bowel sounds  Musculoskeletal: (left hip: Painful range of motion.  Limited range of motion.  No instability.  Neurovascular intact distally. Labs: Recent Results (from the past 2160 hour(s))  Urinalysis, Routine w reflex microscopic     Status: Abnormal   Collection Time: 05/06/18 10:58 AM  Result Value Ref Range   Color, Urine YELLOW YELLOW   APPearance CLEAR CLEAR   Specific Gravity, Urine 1.018 1.005 - 1.030   pH 5.0 5.0 - 8.0   Glucose, UA NEGATIVE NEGATIVE mg/dL   Hgb urine dipstick NEGATIVE NEGATIVE   Bilirubin Urine MODERATE (A) NEGATIVE   Ketones, ur 5 (A) NEGATIVE mg/dL   Protein, ur NEGATIVE NEGATIVE mg/dL   Nitrite NEGATIVE NEGATIVE   Leukocytes,Ua NEGATIVE NEGATIVE    Comment: Performed at [facility], [place] [address], [place], [place] [address]  APTT     Status: None   Collection Time: 05/06/18 11:43 AM  Result Value Ref Range   aPTT 29 24 - 36 seconds    Comment: Performed at [facility], [place] [address] [place], [place] [address]  CBC WITH DIFFERENTIAL     Status: None   Collection Time: 05/06/18 11:43 AM  Result Value Ref Range   WBC 8.5 4.0 - 10.5 K/uL   RBC 4.38 3.87 - 5.11 MIL/uL   Hemoglobin 13.4 12.0 - 15.0 g/dL   HCT 41.6 36.0 - 46.0 %   MCV 95.0 80.0 - 100.0 fL   MCH 30.6 26.0 - 34.0 pg   MCHC 32.2 30.0 - 36.0 g/dL   RDW 13.2 11.5 - 15.5 %   Platelets 276 150 - 400 K/uL   nRBC 0.0 0.0 - 0.2 %   Neutrophils  Relative % 54 %   Neutro Abs 4.6 1.7 - 7.7 K/uL   Lymphocytes Relative 36 %   Lymphs Abs 3.0 0.7 - 4.0 K/uL   Monocytes Relative 8 %   Monocytes Absolute 0.7 0.1 - 1.0 K/uL   Eosinophils Relative 1 %   Eosinophils Absolute 0.1 0.0 - 0.5 K/uL   Basophils Relative 1 %   Basophils Absolute 0.1 0.0 - 0.1 K/uL   Immature Granulocytes 0 %   Abs Immature Granulocytes 0.02 0.00 - 0.07 K/uL    Comment:  Performed at [facility], [place] [address] [place], [place] [address]  Comprehensive metabolic panel     Status: Abnormal   Collection Time: 05/06/18 11:43 AM  Result Value Ref Range   Sodium 138 135 - 145 mmol/L   Potassium 4.7 3.5 - 5.1 mmol/L   Chloride 102 98 - 111 mmol/L   CO2 25 22 - 32 mmol/L   Glucose, Bld 126 (H) 70 - 99 mg/dL   BUN 15 8 - 23 mg/dL   Creatinine, Ser 0.83 0.44 - 1.00 mg/dL   Calcium 10.2 8.9 - 10.3 mg/dL   Total Protein 7.7 6.5 - 8.1 g/dL   Albumin 4.2 3.5 - 5.0 g/dL   AST 18 15 - 41 U/L   ALT 15 0 - 44 U/L   Alkaline Phosphatase 109 38 - 126 U/L   Total Bilirubin 0.6 0.3 - 1.2 mg/dL   GFR calc non Af Amer >60 >60 mL/min   GFR calc Af Amer >60 >60 mL/min   Anion gap 11 5 - 15    Comment: Performed at [facility], [place] [address] [place], [place] [address]  Protime-INR     Status: None   Collection Time: 05/06/18 11:43 AM  Result Value Ref Range   Prothrombin Time 11.9 11.4 - 15.2 seconds   INR 0.9 0.8 - 1.2    Comment: (NOTE) INR goal varies based on device and disease states. Performed at [facility], [place] [address], [place], [place] [address]   Type and screen Order type and screen if day of surgery is less than 15 days from draw of preadmission visit or order morning of surgery if day of surgery is greater than 6 days from preadmission visit.     Status: None   Collection Time: 05/06/18 11:43 AM  Result Value Ref Range   ABO/RH(D) O POS    Antibody Screen NEG    Sample Expiration 05/16/2018    Extend sample reason      NO TRANSFUSIONS OR PREGNANCY IN THE PAST 3 MONTHS Performed at [facility], [place] [address], [place], [place] [address]   Surgical pcr screen     Status: None   Collection Time: 05/06/18 11:43 AM  Result Value Ref Range   MRSA, PCR NEGATIVE NEGATIVE   Staphylococcus aureus NEGATIVE NEGATIVE    Comment: (NOTE) The Xpert SA Assay (FDA approved for  NASAL specimens in patients 14 years of age and older), is one component of a comprehensive surveillance program. It is not intended to diagnose infection nor to guide or monitor treatment. Performed at [facility], [place] [address], [place], [place] [address]   APTT     Status: None   Collection Time: 07/23/18 11:13 AM  Result Value Ref Range   aPTT 27 24 - 36 seconds    Comment: Performed at [facility], [place] [address], [place], [place] [address]  CBC WITH DIFFERENTIAL     Status: Abnormal   Collection Time: 07/23/18 11:13 AM  Result Value Ref Range   WBC 16.5 (H) 4.0 - 10.5 K/uL   RBC 4.88 3.87 - 5.11 MIL/uL   Hemoglobin 14.6 12.0 - 15.0 g/dL   HCT 46.3 (H) 36.0 - 46.0 %   MCV 94.9 80.0 - 100.0 fL   MCH 29.9 26.0 - 34.0 pg   MCHC 31.5 30.0 - 36.0 g/dL   RDW 13.6 11.5 - 15.5 %   Platelets 275 150 - 400 K/uL   nRBC 0.0 0.0 - 0.2 %   Neutrophils Relative % 53 %   Neutro Abs 8.8 (H) 1.7 - 7.7 K/uL   Lymphocytes Relative 36 %   Lymphs Abs 6.0 (H) 0.7 - 4.0 K/uL   Monocytes Relative 8 %   Monocytes Absolute 1.3 (H) 0.1 - 1.0 K/uL   Eosinophils Relative 1 %   Eosinophils Absolute 0.2 0.0 - 0.5 K/uL   Basophils Relative 1 %   Basophils Absolute 0.1 0.0 - 0.1 K/uL   Immature Granulocytes 1 %   Abs Immature Granulocytes 0.09 (H) 0.00 - 0.07 K/uL    Comment: Performed at [facility], [place] [address], [place], [place] [address]  Comprehensive metabolic panel     Status: Abnormal   Collection Time: 07/23/18 11:13 AM  Result Value Ref Range   Sodium 139 135 - 145 mmol/L   Potassium 4.3 3.5 - 5.1 mmol/L   Chloride 106 98 - 111 mmol/L   CO2 25 22 - 32 mmol/L   Glucose, Bld 106 (H) 70 - 99 mg/dL   BUN 15 8 - 23 mg/dL   Creatinine, Ser 0.72 0.44 - 1.00 mg/dL   Calcium 10.0 8.9 - 10.3 mg/dL   Total Protein 7.9 6.5 - 8.1 g/dL   Albumin 4.7 3.5 - 5.0 g/dL   AST 16 15 - 41 U/L   ALT 20 0 - 44 U/L   Alkaline Phosphatase 104 38  - 126 U/L   Total Bilirubin 0.4 0.3 - 1.2 mg/dL   GFR calc non Af Amer >60 >60 mL/min   GFR calc Af Amer >60 >60 mL/min   Anion gap 8 5 - 15    Comment: Performed at [facility], [place] [address], [place], [place] [address]  Protime-INR     Status: None   Collection Time: 07/23/18 11:13 AM  Result Value Ref Range   Prothrombin Time 12.0 11.4 - 15.2 seconds   INR 0.9 0.8 - 1.2    Comment: (NOTE) INR goal varies based on device and disease states. Performed at [facility], [place] [address] [place], [place] [address]   Urinalysis, Routine w reflex microscopic     Status: Abnormal   Collection Time: 07/23/18 11:13 AM  Result Value Ref Range   Color, Urine STRAW (A) YELLOW   APPearance CLEAR CLEAR   Specific Gravity, Urine 1.004 (L) 1.005 - 1.030   pH 6.0 5.0 - 8.0   Glucose, UA NEGATIVE NEGATIVE mg/dL   Hgb urine dipstick NEGATIVE NEGATIVE   Bilirubin Urine NEGATIVE NEGATIVE   Ketones, ur NEGATIVE NEGATIVE mg/dL   Protein, ur NEGATIVE NEGATIVE mg/dL   Nitrite NEGATIVE NEGATIVE   Leukocytes,Ua NEGATIVE NEGATIVE    Comment: Performed at [place] [address], [place], [place] [address]  Surgical pcr screen     Status: None   Collection Time: 07/23/18 11:15 AM  Result Value Ref  Range   MRSA, PCR NEGATIVE NEGATIVE   Staphylococcus aureus NEGATIVE NEGATIVE    Comment: (NOTE) The Xpert SA Assay (FDA approved for NASAL specimens in patients 65 years of age and older), is one component of a comprehensive surveillance program. It is not intended to diagnose infection nor to guide or monitor treatment. Performed at [facility], [place] [address] [place], [place] [address]   Type and screen Order type and screen if day of surgery is less than 15 days from draw of preadmission visit or order morning of surgery if day of surgery is greater than 6 days from preadmission visit.     Status: None   Collection  Time: 07/23/18 12:00 PM  Result Value Ref Range   ABO/RH(D) O POS    Antibody Screen NEG    Sample Expiration 08/06/2018,2359    Extend sample reason      NO TRANSFUSIONS OR PREGNANCY IN THE PAST 3 MONTHS Performed at [facility], [place] [address], [place], [place] [address]   Novel Coronavirus, NAA (hospital order; send-out to ref lab)     Status: None   Collection Time: 07/23/18 12:35 PM  Result Value Ref Range   SARS-CoV-2, NAA NOT DETECTED NOT DETECTED    Comment: (NOTE) This test was developed and its performance characteristics determined by [employer]. This test has not been FDA cleared or approved. This test has been authorized by FDA under an Emergency Use Authorization (EUA). This test is only authorized for the duration of time the declaration that circumstances exist justifying the authorization of the emergency use of in vitro diagnostic tests for detection of SARS-CoV-2 virus and/or diagnosis of COVID-19 infection under section 564(b)(1) of the Act, 21 U.S.C. 259DGL-8(V)(5), unless the authorization is terminated or revoked sooner. When diagnostic testing is negative, the possibility of a false negative result should be considered in the context of a patient's recent exposures and the presence of clinical signs and symptoms consistent with COVID-19. An individual without symptoms of COVID-19 and who is not shedding SARS-CoV-2 virus would expect to have a negative (not detected) result in this assay. Performed  At: [facility] [place], [place] [id number] Rush Farmer MD AC:[id number]    Coronavirus Source NASOPHARYNGEAL     Comment: Performed at [facility], [place] [address] [place], [place] [address]    Estimated body mass index is 33.62 kg/m as calculated from the following:   Height as of 07/23/18: 5\' 5"  (1.651 m).   Weight as of 07/23/18: 91.7 kg.   Imaging Review Plain radiographs demonstrate severe  degenerative joint disease of the left hip(s). The bone quality appears to be fair for age and reported activity level.      Assessment/Plan:  End stage arthritis, left hip(s)  The patient history, physical examination, clinical judgement of the provider and imaging studies are consistent with end stage degenerative joint disease of the left hip(s) and total hip arthroplasty is deemed medically necessary. The treatment options including medical management, injection therapy, arthroscopy and arthroplasty were discussed at length. The risks and benefits of total hip arthroplasty were presented and reviewed. The risks due to aseptic loosening, infection, stiffness, dislocation/subluxation,  thromboembolic complications and other imponderables were discussed.  The patient acknowledged the explanation, agreed to proceed with the plan and consent was signed. Patient is being admitted for inpatient treatment for surgery, pain control, PT, OT, prophylactic antibiotics, VTE prophylaxis, progressive ambulation and ADL's and discharge planning.The patient  is planning to be discharged home with home health services

## 2018-07-25 NOTE — Progress Notes (Signed)
SPOKE [place]     SCREENING SYMPTOMS OF COVID 19:   COUGH--no  RUNNY NOSE--- no  SORE THROAT---no  NASAL CONGESTION----no  SNEEZING----no  SHORTNESS OF BREATH--- no  DIFFICULTY BREATHING---no  TEMP >100.0 -----no  UNEXPLAINED BODY ACHES------no  CHILLS -------- no  HEADACHES ---------no  LOSS OF SMELL/ TASTE --------no    HAVE YOU OR ANY FAMILY MEMBER TRAVELLED PAST 14 DAYS OUT OF THE   COUNTY---no STATE----no COUNTRY----no  HAVE YOU OR ANY FAMILY MEMBER BEEN EXPOSED TO ANYONE WITH COVID 19? no

## 2018-07-26 ENCOUNTER — Observation Stay (HOSPITAL_COMMUNITY)
Admission: RE | Admit: 2018-07-26 | Discharge: 2018-07-27 | Disposition: A | Discharge status: Home / Self Care | Payer: Medicare Other | Attending: Orthopedic Surgery | Admitting: Orthopedic Surgery

## 2018-07-26 ENCOUNTER — Encounter (HOSPITAL_COMMUNITY): Payer: Self-pay

## 2018-07-26 ENCOUNTER — Ambulatory Visit (HOSPITAL_COMMUNITY): Payer: Medicare Other

## 2018-07-26 ENCOUNTER — Ambulatory Visit (HOSPITAL_COMMUNITY): Payer: Medicare Other | Admitting: Certified Registered"

## 2018-07-26 ENCOUNTER — Ambulatory Visit (HOSPITAL_COMMUNITY): Payer: Medicare Other | Admitting: Physician Assistant

## 2018-07-26 ENCOUNTER — Encounter (HOSPITAL_COMMUNITY)
Admission: RE | Disposition: A | Discharge status: Home / Self Care | Payer: Self-pay | Source: Home / Self Care | Attending: Orthopedic Surgery

## 2018-07-26 ENCOUNTER — Other Ambulatory Visit: Payer: Self-pay

## 2018-07-26 DIAGNOSIS — Z9841 Cataract extraction status, right eye: Secondary | ICD-10-CM | POA: Insufficient documentation

## 2018-07-26 DIAGNOSIS — E785 Hyperlipidemia, unspecified: Secondary | ICD-10-CM | POA: Diagnosis not present

## 2018-07-26 DIAGNOSIS — M4805 Spinal stenosis, thoracolumbar region: Secondary | ICD-10-CM | POA: Diagnosis not present

## 2018-07-26 DIAGNOSIS — M1612 Unilateral primary osteoarthritis, left hip: Secondary | ICD-10-CM | POA: Diagnosis not present

## 2018-07-26 DIAGNOSIS — Z9842 Cataract extraction status, left eye: Secondary | ICD-10-CM | POA: Insufficient documentation

## 2018-07-26 DIAGNOSIS — E739 Lactose intolerance, unspecified: Secondary | ICD-10-CM | POA: Diagnosis not present

## 2018-07-26 DIAGNOSIS — Z7982 Long term (current) use of aspirin: Secondary | ICD-10-CM | POA: Diagnosis not present

## 2018-07-26 DIAGNOSIS — K760 Fatty (change of) liver, not elsewhere classified: Secondary | ICD-10-CM | POA: Diagnosis not present

## 2018-07-26 DIAGNOSIS — G8929 Other chronic pain: Secondary | ICD-10-CM | POA: Insufficient documentation

## 2018-07-26 DIAGNOSIS — D649 Anemia, unspecified: Secondary | ICD-10-CM | POA: Diagnosis not present

## 2018-07-26 DIAGNOSIS — Z471 Aftercare following joint replacement surgery: Secondary | ICD-10-CM | POA: Diagnosis not present

## 2018-07-26 DIAGNOSIS — I10 Essential (primary) hypertension: Secondary | ICD-10-CM | POA: Insufficient documentation

## 2018-07-26 DIAGNOSIS — M5126 Other intervertebral disc displacement, lumbar region: Secondary | ICD-10-CM | POA: Diagnosis not present

## 2018-07-26 DIAGNOSIS — Z1159 Encounter for screening for other viral diseases: Secondary | ICD-10-CM | POA: Insufficient documentation

## 2018-07-26 DIAGNOSIS — Z888 Allergy status to other drugs, medicaments and biological substances status: Secondary | ICD-10-CM | POA: Insufficient documentation

## 2018-07-26 DIAGNOSIS — Z7951 Long term (current) use of inhaled steroids: Secondary | ICD-10-CM | POA: Diagnosis not present

## 2018-07-26 DIAGNOSIS — E669 Obesity, unspecified: Secondary | ICD-10-CM | POA: Diagnosis not present

## 2018-07-26 DIAGNOSIS — G4700 Insomnia, unspecified: Secondary | ICD-10-CM | POA: Insufficient documentation

## 2018-07-26 DIAGNOSIS — E210 Primary hyperparathyroidism: Secondary | ICD-10-CM | POA: Insufficient documentation

## 2018-07-26 DIAGNOSIS — G43909 Migraine, unspecified, not intractable, without status migrainosus: Secondary | ICD-10-CM | POA: Diagnosis not present

## 2018-07-26 DIAGNOSIS — Z79899 Other long term (current) drug therapy: Secondary | ICD-10-CM | POA: Insufficient documentation

## 2018-07-26 DIAGNOSIS — I700 Atherosclerosis of aorta: Secondary | ICD-10-CM | POA: Insufficient documentation

## 2018-07-26 DIAGNOSIS — Z8249 Family history of ischemic heart disease and other diseases of the circulatory system: Secondary | ICD-10-CM | POA: Insufficient documentation

## 2018-07-26 DIAGNOSIS — Z8711 Personal history of peptic ulcer disease: Secondary | ICD-10-CM | POA: Diagnosis not present

## 2018-07-26 DIAGNOSIS — Z96642 Presence of left artificial hip joint: Secondary | ICD-10-CM | POA: Diagnosis not present

## 2018-07-26 DIAGNOSIS — F329 Major depressive disorder, single episode, unspecified: Secondary | ICD-10-CM | POA: Insufficient documentation

## 2018-07-26 DIAGNOSIS — F419 Anxiety disorder, unspecified: Secondary | ICD-10-CM | POA: Insufficient documentation

## 2018-07-26 DIAGNOSIS — M5136 Other intervertebral disc degeneration, lumbar region: Secondary | ICD-10-CM | POA: Diagnosis not present

## 2018-07-26 DIAGNOSIS — Z6833 Body mass index (BMI) 33.0-33.9, adult: Secondary | ICD-10-CM | POA: Insufficient documentation

## 2018-07-26 DIAGNOSIS — F17210 Nicotine dependence, cigarettes, uncomplicated: Secondary | ICD-10-CM | POA: Insufficient documentation

## 2018-07-26 DIAGNOSIS — R011 Cardiac murmur, unspecified: Secondary | ICD-10-CM | POA: Diagnosis not present

## 2018-07-26 DIAGNOSIS — Z01811 Encounter for preprocedural respiratory examination: Secondary | ICD-10-CM

## 2018-07-26 DIAGNOSIS — M1712 Unilateral primary osteoarthritis, left knee: Secondary | ICD-10-CM | POA: Diagnosis not present

## 2018-07-26 DIAGNOSIS — M48061 Spinal stenosis, lumbar region without neurogenic claudication: Secondary | ICD-10-CM | POA: Diagnosis not present

## 2018-07-26 DIAGNOSIS — F4310 Post-traumatic stress disorder, unspecified: Secondary | ICD-10-CM | POA: Insufficient documentation

## 2018-07-26 DIAGNOSIS — Z96641 Presence of right artificial hip joint: Secondary | ICD-10-CM | POA: Insufficient documentation

## 2018-07-26 DIAGNOSIS — M1611 Unilateral primary osteoarthritis, right hip: Secondary | ICD-10-CM | POA: Diagnosis not present

## 2018-07-26 DIAGNOSIS — M25559 Pain in unspecified hip: Secondary | ICD-10-CM

## 2018-07-26 DIAGNOSIS — Z96652 Presence of left artificial knee joint: Secondary | ICD-10-CM | POA: Insufficient documentation

## 2018-07-26 HISTORY — PX: TOTAL HIP ARTHROPLASTY: SHX124

## 2018-07-26 LAB — TYPE AND SCREEN
ABO/RH(D): O POS
Antibody Screen: NEGATIVE

## 2018-07-26 SURGERY — ARTHROPLASTY, HIP, TOTAL, ANTERIOR APPROACH
Anesthesia: Spinal | Site: Hip | Laterality: Left

## 2018-07-26 MED ORDER — ESCITALOPRAM OXALATE 10 MG PO TABS
5.0000 mg | ORAL_TABLET | Freq: Every day | ORAL | Status: DC
  Administered 2018-07-27: 5 mg via ORAL
  Filled 2018-07-26: qty 1

## 2018-07-26 MED ORDER — ALUM & MAG HYDROXIDE-SIMETH 200-200-20 MG/5ML PO SUSP: 30.0000 mL | ORAL | Status: DC | PRN

## 2018-07-26 MED ORDER — GABAPENTIN 300 MG PO CAPS
300.0000 mg | ORAL_CAPSULE | Freq: Three times a day (TID) | ORAL | Status: DC
  Administered 2018-07-26 – 2018-07-27 (×3): 300 mg via ORAL
  Filled 2018-07-26 (×3): qty 1

## 2018-07-26 MED ORDER — AMLODIPINE BESYLATE 10 MG PO TABS
10.0000 mg | ORAL_TABLET | Freq: Every day | ORAL | Status: DC
  Administered 2018-07-27: 10 mg via ORAL
  Filled 2018-07-26: qty 1

## 2018-07-26 MED ORDER — MIDAZOLAM HCL 2 MG/2ML IJ SOLN
INTRAMUSCULAR | Status: DC | PRN
  Administered 2018-07-26: 2 mg via INTRAVENOUS

## 2018-07-26 MED ORDER — ALBUTEROL SULFATE (2.5 MG/3ML) 0.083% IN NEBU: 2.5000 mg | INHALATION_SOLUTION | RESPIRATORY_TRACT | Status: DC | PRN

## 2018-07-26 MED ORDER — CEFAZOLIN SODIUM-DEXTROSE 2-4 GM/100ML-% IV SOLN
2.0000 g | Freq: Four times a day (QID) | INTRAVENOUS | Status: AC
  Administered 2018-07-26 – 2018-07-27 (×2): 2 g via INTRAVENOUS
  Filled 2018-07-26: qty 100

## 2018-07-26 MED ORDER — BUPIVACAINE IN DEXTROSE 0.75-8.25 % IT SOLN
INTRATHECAL | Status: DC | PRN
  Administered 2018-07-26: 1.6 mL via INTRATHECAL

## 2018-07-26 MED ORDER — BUSPIRONE HCL 5 MG PO TABS
10.0000 mg | ORAL_TABLET | Freq: Two times a day (BID) | ORAL | Status: DC
  Administered 2018-07-26 – 2018-07-27 (×2): 10 mg via ORAL
  Filled 2018-07-26 (×2): qty 2

## 2018-07-26 MED ORDER — CEFAZOLIN SODIUM-DEXTROSE 2-4 GM/100ML-% IV SOLN
2.0000 g | INTRAVENOUS | Status: AC
  Administered 2018-07-26: 2 g via INTRAVENOUS
  Filled 2018-07-26: qty 100

## 2018-07-26 MED ORDER — DOCUSATE SODIUM 100 MG PO CAPS: 100.0000 mg | ORAL_CAPSULE | Freq: Two times a day (BID) | ORAL | 0 refills | Status: DC

## 2018-07-26 MED ORDER — LISINOPRIL 20 MG PO TABS
20.0000 mg | ORAL_TABLET | Freq: Every day | ORAL | Status: DC
  Administered 2018-07-27: 20 mg via ORAL
  Filled 2018-07-26: qty 1

## 2018-07-26 MED ORDER — PROPOFOL 10 MG/ML IV BOLUS
INTRAVENOUS | Status: DC | PRN
  Administered 2018-07-26: 20 mg via INTRAVENOUS

## 2018-07-26 MED ORDER — ATORVASTATIN CALCIUM 40 MG PO TABS
40.0000 mg | ORAL_TABLET | Freq: Every day | ORAL | Status: DC
  Administered 2018-07-27: 40 mg via ORAL
  Filled 2018-07-26: qty 1

## 2018-07-26 MED ORDER — OXYCODONE HCL 5 MG PO TABS
5.0000 mg | ORAL_TABLET | ORAL | Status: DC | PRN
  Administered 2018-07-26 – 2018-07-27 (×5): 10 mg via ORAL
  Filled 2018-07-26: qty 2
  Filled 2018-07-26: qty 1
  Filled 2018-07-26: qty 2
  Filled 2018-07-26: qty 1
  Filled 2018-07-26 (×2): qty 2

## 2018-07-26 MED ORDER — ONDANSETRON HCL 4 MG/2ML IJ SOLN: 4.0000 mg | Freq: Four times a day (QID) | INTRAMUSCULAR | Status: DC | PRN

## 2018-07-26 MED ORDER — DEXAMETHASONE SODIUM PHOSPHATE 10 MG/ML IJ SOLN
INTRAMUSCULAR | Status: AC
  Filled 2018-07-26: qty 1

## 2018-07-26 MED ORDER — PROPOFOL 10 MG/ML IV BOLUS
INTRAVENOUS | Status: AC
  Filled 2018-07-26: qty 60

## 2018-07-26 MED ORDER — ONDANSETRON HCL 4 MG/2ML IJ SOLN: 4.0000 mg | Freq: Once | INTRAMUSCULAR | Status: DC | PRN

## 2018-07-26 MED ORDER — ACETAMINOPHEN 325 MG PO TABS
325.0000 mg | ORAL_TABLET | Freq: Four times a day (QID) | ORAL | Status: DC | PRN
  Administered 2018-07-27: 650 mg via ORAL
  Filled 2018-07-26: qty 2

## 2018-07-26 MED ORDER — ONDANSETRON HCL 4 MG/2ML IJ SOLN
INTRAMUSCULAR | Status: AC
  Filled 2018-07-26: qty 2

## 2018-07-26 MED ORDER — FENTANYL CITRATE (PF) 100 MCG/2ML IJ SOLN
INTRAMUSCULAR | Status: AC
  Filled 2018-07-26: qty 2

## 2018-07-26 MED ORDER — LIDOCAINE 2% (20 MG/ML) 5 ML SYRINGE
INTRAMUSCULAR | Status: AC
  Filled 2018-07-26: qty 5

## 2018-07-26 MED ORDER — TRANEXAMIC ACID-NACL 1000-0.7 MG/100ML-% IV SOLN
1000.0000 mg | Freq: Once | INTRAVENOUS | Status: AC
  Administered 2018-07-26: 1000 mg via INTRAVENOUS
  Filled 2018-07-26: qty 100

## 2018-07-26 MED ORDER — DIPHENHYDRAMINE HCL 12.5 MG/5ML PO ELIX: 12.5000 mg | ORAL_SOLUTION | ORAL | Status: DC | PRN

## 2018-07-26 MED ORDER — POLYETHYLENE GLYCOL 3350 17 G PO PACK: 17.0000 g | PACK | Freq: Every day | ORAL | Status: DC | PRN

## 2018-07-26 MED ORDER — PROPOFOL 500 MG/50ML IV EMUL
INTRAVENOUS | Status: DC | PRN
  Administered 2018-07-26: 75 ug/kg/min via INTRAVENOUS

## 2018-07-26 MED ORDER — FENTANYL CITRATE (PF) 100 MCG/2ML IJ SOLN
25.0000 ug | INTRAMUSCULAR | Status: DC | PRN
  Administered 2018-07-26 (×2): 50 ug via INTRAVENOUS

## 2018-07-26 MED ORDER — FENTANYL CITRATE (PF) 100 MCG/2ML IJ SOLN
INTRAMUSCULAR | Status: DC | PRN
  Administered 2018-07-26 (×2): 50 ug via INTRAVENOUS

## 2018-07-26 MED ORDER — MAGNESIUM CITRATE PO SOLN: 1.0000 | Freq: Once | ORAL | Status: DC | PRN

## 2018-07-26 MED ORDER — METHOCARBAMOL 500 MG PO TABS
500.0000 mg | ORAL_TABLET | Freq: Four times a day (QID) | ORAL | Status: DC | PRN
  Administered 2018-07-27: 500 mg via ORAL
  Filled 2018-07-26 (×2): qty 1

## 2018-07-26 MED ORDER — ONDANSETRON HCL 4 MG/2ML IJ SOLN
INTRAMUSCULAR | Status: DC | PRN
  Administered 2018-07-26: 4 mg via INTRAVENOUS

## 2018-07-26 MED ORDER — BUPIVACAINE LIPOSOME 1.3 % IJ SUSP
10.0000 mL | Freq: Once | INTRAMUSCULAR | Status: AC
  Administered 2018-07-26: 14:00:00 10 mL
  Filled 2018-07-26: qty 10

## 2018-07-26 MED ORDER — BISACODYL 5 MG PO TBEC: 5.0000 mg | DELAYED_RELEASE_TABLET | Freq: Every day | ORAL | Status: DC | PRN

## 2018-07-26 MED ORDER — CHLORHEXIDINE GLUCONATE 4 % EX LIQD: 60.0000 mL | Freq: Once | CUTANEOUS | Status: DC

## 2018-07-26 MED ORDER — METHOCARBAMOL 500 MG IVPB - SIMPLE MED
INTRAVENOUS | Status: AC
  Filled 2018-07-26: qty 50

## 2018-07-26 MED ORDER — METHOCARBAMOL 500 MG IVPB - SIMPLE MED
500.0000 mg | Freq: Four times a day (QID) | INTRAVENOUS | Status: DC | PRN
  Administered 2018-07-26: 500 mg via INTRAVENOUS
  Filled 2018-07-26: qty 50

## 2018-07-26 MED ORDER — OXYCODONE-ACETAMINOPHEN 5-325 MG PO TABS: 1.0000 | ORAL_TABLET | Freq: Four times a day (QID) | ORAL | 0 refills | Status: DC | PRN

## 2018-07-26 MED ORDER — BUPIVACAINE-EPINEPHRINE (PF) 0.25% -1:200000 IJ SOLN
INTRAMUSCULAR | Status: AC
  Filled 2018-07-26: qty 30

## 2018-07-26 MED ORDER — HYDROMORPHONE HCL 1 MG/ML IJ SOLN: 0.5000 mg | INTRAMUSCULAR | Status: DC | PRN

## 2018-07-26 MED ORDER — EPHEDRINE SULFATE-NACL 50-0.9 MG/10ML-% IV SOSY
PREFILLED_SYRINGE | INTRAVENOUS | Status: DC | PRN
  Administered 2018-07-26 (×4): 5 mg via INTRAVENOUS
  Administered 2018-07-26: 10 mg via INTRAVENOUS
  Administered 2018-07-26 (×2): 5 mg via INTRAVENOUS

## 2018-07-26 MED ORDER — POVIDONE-IODINE 10 % EX SWAB
2.0000 "application " | Freq: Once | CUTANEOUS | Status: AC
  Administered 2018-07-26: 2 via TOPICAL

## 2018-07-26 MED ORDER — DEXAMETHASONE SODIUM PHOSPHATE 10 MG/ML IJ SOLN
INTRAMUSCULAR | Status: DC | PRN
  Administered 2018-07-26: 8 mg via INTRAVENOUS

## 2018-07-26 MED ORDER — LACTATED RINGERS IV SOLN
INTRAVENOUS | Status: DC
  Administered 2018-07-26: 11:00:00 via INTRAVENOUS

## 2018-07-26 MED ORDER — BUPIVACAINE-EPINEPHRINE (PF) 0.25% -1:200000 IJ SOLN
INTRAMUSCULAR | Status: DC | PRN
  Administered 2018-07-26: 30 mL

## 2018-07-26 MED ORDER — EPHEDRINE 5 MG/ML INJ
INTRAVENOUS | Status: AC
  Filled 2018-07-26: qty 10

## 2018-07-26 MED ORDER — 0.9 % SODIUM CHLORIDE (POUR BTL) OPTIME
TOPICAL | Status: DC | PRN
  Administered 2018-07-26: 1000 mL

## 2018-07-26 MED ORDER — ASPIRIN EC 325 MG PO TBEC
325.0000 mg | DELAYED_RELEASE_TABLET | Freq: Two times a day (BID) | ORAL | Status: DC
  Administered 2018-07-27: 325 mg via ORAL
  Filled 2018-07-26: qty 1

## 2018-07-26 MED ORDER — DEXAMETHASONE SODIUM PHOSPHATE 10 MG/ML IJ SOLN
10.0000 mg | Freq: Two times a day (BID) | INTRAMUSCULAR | Status: DC
  Administered 2018-07-26 – 2018-07-27 (×2): 10 mg via INTRAVENOUS
  Filled 2018-07-26 (×2): qty 1

## 2018-07-26 MED ORDER — TRANEXAMIC ACID-NACL 1000-0.7 MG/100ML-% IV SOLN
1000.0000 mg | INTRAVENOUS | Status: AC
  Administered 2018-07-26: 1000 mg via INTRAVENOUS
  Filled 2018-07-26: qty 100

## 2018-07-26 MED ORDER — PROPOFOL 10 MG/ML IV BOLUS
INTRAVENOUS | Status: AC
  Filled 2018-07-26: qty 40

## 2018-07-26 MED ORDER — SODIUM CHLORIDE 0.9 % IV SOLN
INTRAVENOUS | Status: DC
  Administered 2018-07-26 – 2018-07-27 (×2): via INTRAVENOUS

## 2018-07-26 MED ORDER — WATER FOR IRRIGATION, STERILE IR SOLN
Status: DC | PRN
  Administered 2018-07-26: 2000 mL

## 2018-07-26 MED ORDER — DOCUSATE SODIUM 100 MG PO CAPS
100.0000 mg | ORAL_CAPSULE | Freq: Two times a day (BID) | ORAL | Status: DC
  Administered 2018-07-27: 100 mg via ORAL
  Filled 2018-07-26 (×2): qty 1

## 2018-07-26 MED ORDER — TIZANIDINE HCL 2 MG PO TABS: 2.0000 mg | ORAL_TABLET | Freq: Three times a day (TID) | ORAL | 0 refills | Status: DC | PRN

## 2018-07-26 MED ORDER — ONDANSETRON HCL 4 MG PO TABS: 4.0000 mg | ORAL_TABLET | Freq: Four times a day (QID) | ORAL | Status: DC | PRN

## 2018-07-26 MED ORDER — MIDAZOLAM HCL 2 MG/2ML IJ SOLN
INTRAMUSCULAR | Status: AC
  Filled 2018-07-26: qty 2

## 2018-07-26 MED ORDER — ASPIRIN EC 325 MG PO TBEC: 325.0000 mg | DELAYED_RELEASE_TABLET | Freq: Two times a day (BID) | ORAL | 0 refills | Status: DC

## 2018-07-26 SURGICAL SUPPLY — 38 items
BAG ZIPLOCK 12X15 (MISCELLANEOUS) IMPLANT
BENZOIN TINCTURE PRP APPL 2/3 (GAUZE/BANDAGES/DRESSINGS) ×2 IMPLANT
BLADE SAW SGTL 18X1.27X75 (BLADE) ×2 IMPLANT
BLADE SAW SGTL 18X1.27X75MM (BLADE) ×1
BLADE SURG SZ10 CARB STEEL (BLADE) ×6 IMPLANT
CLOSURE WOUND 1/2 X4 (GAUZE/BANDAGES/DRESSINGS) ×1
COVER PERINEAL POST (MISCELLANEOUS) ×3 IMPLANT
COVER SURGICAL LIGHT HANDLE (MISCELLANEOUS) ×3 IMPLANT
COVER WAND RF STERILE (DRAPES) IMPLANT
DRAPE STERI IOBAN 125X83 (DRAPES) ×3 IMPLANT
DRAPE U-SHAPE 47X51 STRL (DRAPES) ×6 IMPLANT
DRSG AQUACEL AG ADV 3.5X 6 (GAUZE/BANDAGES/DRESSINGS) ×3 IMPLANT
DURAPREP 26ML APPLICATOR (WOUND CARE) ×3 IMPLANT
ELECT BLADE TIP CTD 4 INCH (ELECTRODE) ×3 IMPLANT
ELECT REM PT RETURN 15FT ADLT (MISCELLANEOUS) ×3 IMPLANT
ELIMINATOR HOLE APEX DEPUY (Hips) ×2 IMPLANT
GAUZE XEROFORM 1X8 LF (GAUZE/BANDAGES/DRESSINGS) IMPLANT
GLOVE BIOGEL PI IND STRL 8 (GLOVE) ×2 IMPLANT
GLOVE BIOGEL PI INDICATOR 8 (GLOVE) ×4
GLOVE ECLIPSE 7.5 STRL STRAW (GLOVE) ×6 IMPLANT
GOWN STRL REUS W/TWL XL LVL3 (GOWN DISPOSABLE) ×6 IMPLANT
HEAD FEMORAL 32 CERAMIC (Hips) ×2 IMPLANT
HOLDER FOLEY CATH W/STRAP (MISCELLANEOUS) ×3 IMPLANT
HOOD PEEL AWAY FLYTE STAYCOOL (MISCELLANEOUS) ×6 IMPLANT
KIT TURNOVER KIT A (KITS) IMPLANT
LINER ACETABULAR 32X50 (Liner) ×2 IMPLANT
NEEDLE HYPO 22GX1.5 SAFETY (NEEDLE) ×3 IMPLANT
PACK ANTERIOR HIP CUSTOM (KITS) ×3 IMPLANT
PIN SECT CUP 50MM (Hips) ×2 IMPLANT
STAPLER VISISTAT 35W (STAPLE) IMPLANT
STEM FEMORAL SZ6 HIGH ACTIS (Stem) ×2 IMPLANT
STRIP CLOSURE SKIN 1/2X4 (GAUZE/BANDAGES/DRESSINGS) ×1 IMPLANT
SUT ETHIBOND NAB CT1 #1 30IN (SUTURE) ×6 IMPLANT
SUT MNCRL AB 3-0 PS2 18 (SUTURE) IMPLANT
SUT VIC AB 0 CT1 36 (SUTURE) ×3 IMPLANT
SUT VIC AB 1 CT1 36 (SUTURE) ×3 IMPLANT
TRAY FOLEY MTR SLVR 16FR STAT (SET/KITS/TRAYS/PACK) ×3 IMPLANT
YANKAUER SUCT BULB TIP 10FT TU (MISCELLANEOUS) ×3 IMPLANT

## 2018-07-26 NOTE — Anesthesia Procedure Notes (Signed)
Procedure Name: MAC Date/Time: 07/26/2018 12:21 PM Performed by: Eben Burow, CRNA Pre-anesthesia Checklist: Patient identified, Emergency Drugs available, Suction available, Patient being monitored and Timeout performed Oxygen Delivery Method: Simple face mask Dental Injury: Teeth and Oropharynx as per pre-operative assessment

## 2018-07-26 NOTE — Anesthesia Preprocedure Evaluation (Addendum)
Anesthesia Evaluation  Patient identified by MRN, date of birth, ID band Patient awake    Reviewed: Allergy & Precautions, NPO status , Patient's Chart, lab work & pertinent test results  History of Anesthesia Complications Negative for: history of anesthetic complications  Airway Mallampati: II  TM Distance: >3 FB Neck ROM: Full    Dental  (+) Partial Lower, Upper Dentures   Pulmonary Current Smoker,    breath sounds clear to auscultation       Cardiovascular hypertension, Pt. on medications (-) angina Rhythm:Regular Rate:Normal     Neuro/Psych  Headaches, PSYCHIATRIC DISORDERS Anxiety Depression    GI/Hepatic Neg liver ROS, PUD,   Endo/Other   Obesity   Renal/GU negative Renal ROS     Musculoskeletal  (+) Arthritis ,   Abdominal   Peds  Hematology  (+) anemia ,   Anesthesia Other Findings   Reproductive/Obstetrics                            Anesthesia Physical Anesthesia Plan  ASA: II  Anesthesia Plan: Spinal   Post-op Pain Management:    Induction:   PONV Risk Score and Plan: 2 and Treatment may vary due to age or medical condition and Propofol infusion  Airway Management Planned: Natural Airway and Simple Face Mask  Additional Equipment: None  Intra-op Plan:   Post-operative Plan:   Informed Consent: I have reviewed the patients History and Physical, chart, labs and discussed the procedure including the risks, benefits and alternatives for the proposed anesthesia with the patient or authorized representative who has indicated his/her understanding and acceptance.       Plan Discussed with: CRNA and Anesthesiologist  Anesthesia Plan Comments: (Labs reviewed, platelets acceptable. Discussed risks and benefits of spinal, including spinal/epidural hematoma, infection, failed block, and PDPH. Patient expressed understanding and wished to proceed. )        Anesthesia Quick Evaluation

## 2018-07-26 NOTE — Anesthesia Procedure Notes (Deleted)
Performed by: Alichia Alridge B, CRNA       

## 2018-07-26 NOTE — Evaluation (Signed)
Physical Therapy Evaluation Patient Details Name: Alexis Cole MRN: [id number] DOB: 1945/06/30 Today's Date: 07/26/2018   History of Present Illness  73 yo female s/p L DA-THA on 07/26/18. PMH includes PTSD, PNA, HTN, syncope, HLD, anxiety/depression, R THA, L TKR.   Clinical Impression   Pt presents with L hip pain, post-surgical L hip weakness, increased time and effort to perform mobility tasks, and decreased activity tolerance. Pt to benefit from acute PT to address deficits. Pt ambulated >100 ft with RW with min guard assist, verbal cuing for safety and form provided. Pt educated on ankle pumps (20/hour) to perform this afternoon/evening to increase circulation, to pt's tolerance and limited by pain. PT to progress mobility as tolerated, and will continue to follow acutely.        Follow Up Recommendations Follow surgeon's recommendation for DC plan and follow-up therapies;Supervision for mobility/OOB    Equipment Recommendations  Other (comment)(Pt requesting rollator for outdoor ambulation, per pt this was already discussed and set up with MD that Rollator would be delivered here)    Recommendations for Other Services       Precautions / Restrictions Precautions Precautions: Fall Restrictions Weight Bearing Restrictions: No Other Position/Activity Restrictions: WBAT      Mobility  Bed Mobility Overal bed mobility: Needs Assistance Bed Mobility: Supine to Sit     Supine to sit: Min guard;HOB elevated     General bed mobility comments: Min guard for safety, increased time/effort.   Transfers Overall transfer level: Needs assistance Equipment used: Rolling walker (2 wheeled) Transfers: Sit to/from Stand Sit to Stand: Min guard;From elevated surface         General transfer comment: Min guard for safety, verbal cuing for hand placement when rising.   Ambulation/Gait Ambulation/Gait assistance: Min guard Gait Distance (Feet): 125 Feet Assistive device: Rolling  walker (2 wheeled) Gait Pattern/deviations: Step-through pattern;Decreased stride length;Trunk flexed Gait velocity: slightly decr   General Gait Details: Min guard for safety, verbal cuing for sequencing, placement in RW, and turning with rw.  Stairs            Wheelchair Mobility    Modified Rankin (Stroke Patients Only)       Balance Overall balance assessment: Mild deficits observed, not formally tested                                           Pertinent Vitals/Pain Pain Assessment: 0-10 Pain Score: 7  Pain Location: L hip  Pain Descriptors / Indicators: Sore Pain Intervention(s): Monitored during session;Repositioned;Limited activity within patient's tolerance;Premedicated before session;Ice applied    Home Living Family/patient expects to be discharged to:: Private residence Living Arrangements: Alone Available Help at Discharge: Family;Available 24 hours/day(grandchildren and son to assist and stay with her as needed) Type of Home: Apartment Home Access: Level entry     Home Layout: One level Home Equipment: Bedside commode;Walker - 2 wheels;Cane - single point      Prior Function Level of Independence: Independent         Comments: 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      Hand Dominance   Dominant Hand: Right    Extremity/Trunk Assessment   Upper Extremity Assessment Upper Extremity Assessment: Overall WFL for tasks assessed    Lower Extremity Assessment Lower Extremity Assessment: Overall WFL for tasks assessed;LLE deficits/detail LLE Deficits / Details: suspected post-surgical weakness; able  to perform ankle pumps, heel slide, quad set  LLE Sensation: WNL    Cervical / Trunk Assessment Cervical / Trunk Assessment: Normal  Communication   Communication: No difficulties  Cognition Arousal/Alertness: Awake/alert Behavior During Therapy: WFL for tasks assessed/performed Overall Cognitive Status:  Within Functional Limits for tasks assessed                                        General Comments      Exercises     Assessment/Plan    PT Assessment Patient needs continued PT services  PT Problem List Decreased strength;Decreased mobility;Decreased range of motion;Decreased balance;Decreased activity tolerance;Decreased knowledge of use of DME;Pain       PT Treatment Interventions DME instruction;Balance training;Patient/family education;Functional mobility training;Gait training;Therapeutic activities;Stair training;Therapeutic exercise    PT Goals (Current goals can be found in the Care Plan section)  Acute Rehab PT Goals Patient Stated Goal: go home with family PT Goal Formulation: With patient Time For Goal Achievement: 08/02/18 Potential to Achieve Goals: Good    Frequency 7X/week   Barriers to discharge        Co-evaluation               AM-PAC PT "6 Clicks" Mobility  Outcome Measure Help needed turning from your back to your side while in a flat bed without using bedrails?: A Little Help needed moving from lying on your back to sitting on the side of a flat bed without using bedrails?: A Little Help needed moving to and from a bed to a chair (including a wheelchair)?: A Little Help needed standing up from a chair using your arms (e.g., wheelchair or bedside chair)?: A Little Help needed to walk in hospital room?: A Little Help needed climbing 3-5 steps with a railing? : A Little 6 Click Score: 18    End of Session Equipment Utilized During Treatment: Gait belt Activity Tolerance: Patient tolerated treatment well;Patient limited by pain Patient left: in chair;with chair alarm set;with call bell/phone within reach(SCD break for skin integrity) Nurse Communication: Mobility status PT Visit Diagnosis: Other abnormalities of gait and mobility (R26.89);Difficulty in walking, not elsewhere classified (R26.2)    Time: 1738-1800 PT Time  Calculation (min) (ACUTE ONLY): 22 min   Charges:   PT Evaluation $PT Eval Low Complexity: 1 Low          Dmetrius Ambs Conception Chancy, PT Acute Rehabilitation Services Pager [phone]  Office [phone]   Roxine Caddy D Elonda Husky 07/26/2018, 6:34 PM

## 2018-07-26 NOTE — Interval H&P Note (Signed)
History and Physical Interval Note:  07/26/2018 11:41 AM  Alexis Cole  has presented today for surgery, with the diagnosis of LEFT ANTERIOR TOTAL HIP ARTHROPLASTY.  The various methods of treatment have been discussed with the patient and family. After consideration of risks, benefits and other options for treatment, the patient has consented to  Procedure(s): TOTAL HIP ARTHROPLASTY ANTERIOR APPROACH (Left) as a surgical intervention.  The patient's history has been reviewed, patient examined, no change in status, stable for surgery.  I have reviewed the patient's chart and labs.  Questions were answered to the patient's satisfaction.     [name]

## 2018-07-26 NOTE — Anesthesia Procedure Notes (Signed)
Spinal  Patient location during procedure: OR Start time: 07/26/2018 12:24 PM End time: 07/26/2018 12:30 PM Staffing Anesthesiologist: Audry Pili, MD Performed: anesthesiologist  Preanesthetic Checklist Completed: patient identified, surgical consent, pre-op evaluation, timeout performed, IV checked, risks and benefits discussed and monitors and equipment checked Spinal Block Patient position: sitting Prep: DuraPrep Patient monitoring: heart rate, cardiac monitor, continuous pulse ox and blood pressure Approach: midline Location: L3-4 Injection technique: single-shot Needle Needle type: Quincke  Needle gauge: 22 G Additional Notes Consent was obtained prior to the procedure with all questions answered and concerns addressed. Risks including, but not limited to, bleeding, infection, nerve damage, paralysis, failed block, inadequate analgesia, allergic reaction, high spinal, itching, and headache were discussed and the patient wished to proceed. Functioning IV was confirmed and monitors were applied. Sterile prep and drape, including hand hygiene, mask, and sterile gloves were used. The patient was positioned and the spine was prepped. The skin was anesthetized with lidocaine. Free flow of clear CSF was obtained prior to injecting local anesthetic into the CSF. The spinal needle aspirated freely following injection. The needle was carefully withdrawn. The patient tolerated the procedure well.   Renold Don, MD

## 2018-07-26 NOTE — Transfer of Care (Signed)
Immediate Anesthesia Transfer of Care Note  Patient: Alexis Cole  Procedure(s) Performed: TOTAL HIP ARTHROPLASTY ANTERIOR APPROACH (Left Hip)  Patient Location: PACU  Anesthesia Type:Spinal  Level of Consciousness: awake and patient cooperative  Airway & Oxygen Therapy: Patient Spontanous Breathing and Patient connected to face mask oxygen  Post-op Assessment: Report given to RN and Post -op Vital signs reviewed and stable  Post vital signs: Reviewed and stable  Last Vitals:  Vitals Value Taken Time  BP 99/60 07/26/2018  2:40 PM  Temp    Pulse 77 07/26/2018  2:42 PM  Resp 19 07/26/2018  2:42 PM  SpO2 96 % 07/26/2018  2:42 PM  Vitals shown include unvalidated device data.  Last Pain:  Vitals:   07/26/18 1010  TempSrc:   PainSc: 7       Patients Stated Pain Goal: 3 (21/19/41 7408)  Complications: No apparent anesthesia complications

## 2018-07-26 NOTE — Discharge Instructions (Signed)

## 2018-07-26 NOTE — Anesthesia Postprocedure Evaluation (Signed)
Anesthesia Post Note  Patient: JAMILYNN WHITACRE  Procedure(s) Performed: TOTAL HIP ARTHROPLASTY ANTERIOR APPROACH (Left Hip)     Patient location during evaluation: PACU Anesthesia Type: Spinal Level of consciousness: oriented, awake and alert and awake Pain management: pain level controlled Vital Signs Assessment: post-procedure vital signs reviewed and stable Respiratory status: spontaneous breathing, respiratory function stable, patient connected to nasal cannula oxygen and nonlabored ventilation Cardiovascular status: blood pressure returned to baseline and stable Postop Assessment: no headache, no backache, no apparent nausea or vomiting and spinal receding Anesthetic complications: no    Last Vitals:  Vitals:   07/26/18 1545 07/26/18 1655  BP: 114/62 115/71  Pulse: 75 88  Resp: 12 16  Temp:  36.6 C  SpO2: 99% 100%    Last Pain:  Vitals:   07/26/18 1655  TempSrc: Oral  PainSc:                  Catalina Gravel

## 2018-07-27 DIAGNOSIS — K760 Fatty (change of) liver, not elsewhere classified: Secondary | ICD-10-CM | POA: Diagnosis not present

## 2018-07-27 DIAGNOSIS — I700 Atherosclerosis of aorta: Secondary | ICD-10-CM | POA: Diagnosis not present

## 2018-07-27 DIAGNOSIS — Z1159 Encounter for screening for other viral diseases: Secondary | ICD-10-CM | POA: Diagnosis not present

## 2018-07-27 DIAGNOSIS — G8929 Other chronic pain: Secondary | ICD-10-CM | POA: Diagnosis not present

## 2018-07-27 DIAGNOSIS — E739 Lactose intolerance, unspecified: Secondary | ICD-10-CM | POA: Diagnosis not present

## 2018-07-27 DIAGNOSIS — Z7982 Long term (current) use of aspirin: Secondary | ICD-10-CM | POA: Diagnosis not present

## 2018-07-27 DIAGNOSIS — R011 Cardiac murmur, unspecified: Secondary | ICD-10-CM | POA: Diagnosis not present

## 2018-07-27 DIAGNOSIS — E210 Primary hyperparathyroidism: Secondary | ICD-10-CM | POA: Diagnosis not present

## 2018-07-27 DIAGNOSIS — M1612 Unilateral primary osteoarthritis, left hip: Secondary | ICD-10-CM | POA: Diagnosis not present

## 2018-07-27 DIAGNOSIS — M5136 Other intervertebral disc degeneration, lumbar region: Secondary | ICD-10-CM | POA: Diagnosis not present

## 2018-07-27 DIAGNOSIS — E785 Hyperlipidemia, unspecified: Secondary | ICD-10-CM | POA: Diagnosis not present

## 2018-07-27 DIAGNOSIS — Z8711 Personal history of peptic ulcer disease: Secondary | ICD-10-CM | POA: Diagnosis not present

## 2018-07-27 DIAGNOSIS — G43909 Migraine, unspecified, not intractable, without status migrainosus: Secondary | ICD-10-CM | POA: Diagnosis not present

## 2018-07-27 DIAGNOSIS — M5126 Other intervertebral disc displacement, lumbar region: Secondary | ICD-10-CM | POA: Diagnosis not present

## 2018-07-27 DIAGNOSIS — Z7951 Long term (current) use of inhaled steroids: Secondary | ICD-10-CM | POA: Diagnosis not present

## 2018-07-27 DIAGNOSIS — I10 Essential (primary) hypertension: Secondary | ICD-10-CM | POA: Diagnosis not present

## 2018-07-27 DIAGNOSIS — M4805 Spinal stenosis, thoracolumbar region: Secondary | ICD-10-CM | POA: Diagnosis not present

## 2018-07-27 DIAGNOSIS — M48061 Spinal stenosis, lumbar region without neurogenic claudication: Secondary | ICD-10-CM | POA: Diagnosis not present

## 2018-07-27 DIAGNOSIS — G4700 Insomnia, unspecified: Secondary | ICD-10-CM | POA: Diagnosis not present

## 2018-07-27 LAB — CBC
HCT: 39.4 % (ref 36.0–46.0)
Hemoglobin: 12.1 g/dL (ref 12.0–15.0)
MCH: 30 pg (ref 26.0–34.0)
MCHC: 30.7 g/dL (ref 30.0–36.0)
MCV: 97.8 fL (ref 80.0–100.0)
Platelets: 195 10*3/uL (ref 150–400)
RBC: 4.03 MIL/uL (ref 3.87–5.11)
RDW: 13.6 % (ref 11.5–15.5)
WBC: 16.3 10*3/uL — ABNORMAL HIGH (ref 4.0–10.5)
nRBC: 0 % (ref 0.0–0.2)

## 2018-07-27 NOTE — Care Management Obs Status (Signed)
[facility] NOTIFICATION   Patient Details  Name: Alexis Cole MRN: [id number] Date of Birth: 25-Apr-1945   Medicare Observation Status Notification Given:  Yes    Erenest Rasher, RN 07/27/2018, 10:31 AM

## 2018-07-27 NOTE — Progress Notes (Signed)
Pt stable at this time awaiting a ride home. No needs at this time. Pt medicated for pain for pain prior to d/c. Pt to have rolling walker with seat delivered to home and home health has been arranged.

## 2018-07-27 NOTE — Progress Notes (Signed)
Physical Therapy Treatment Patient Details Name: Alexis Cole MRN: [id number] DOB: 30-Jan-1946 Today's Date: 07/27/2018    History of Present Illness 73 yo female s/p L DA-THA on 07/26/18. PMH includes PTSD, PNA, HTN, syncope, HLD, anxiety/depression, R THA, L TKR.     PT Comments    Pt progressing well with mobility and eager for dc home this date.   Follow Up Recommendations  Follow surgeon's recommendation for DC plan and follow-up therapies;Supervision for mobility/OOB     Equipment Recommendations  Other (comment)    Recommendations for Other Services       Precautions / Restrictions Precautions Precautions: Fall Restrictions Weight Bearing Restrictions: No Other Position/Activity Restrictions: WBAT    Mobility  Bed Mobility Overal bed mobility: Needs Assistance Bed Mobility: Supine to Sit     Supine to sit: Supervision     General bed mobility comments: Sup for safety, increased time/effort.   Transfers Overall transfer level: Needs assistance Equipment used: Rolling walker (2 wheeled) Transfers: Sit to/from Stand Sit to Stand: Min guard;From elevated surface         General transfer comment: Min guard for safety, verbal cuing for hand placement when rising.   Ambulation/Gait Ambulation/Gait assistance: Min guard Gait Distance (Feet): 250 Feet Assistive device: Rolling walker (2 wheeled) Gait Pattern/deviations: Step-through pattern;Decreased stride length;Trunk flexed Gait velocity: slightly decr   General Gait Details: Min guard for safety, verbal cuing for sequencing, placement in RW, and turning with rw.   Stairs             Wheelchair Mobility    Modified Rankin (Stroke Patients Only)       Balance Overall balance assessment: Mild deficits observed, not formally tested                                          Cognition Arousal/Alertness: Awake/alert Behavior During Therapy: WFL for tasks  assessed/performed Overall Cognitive Status: Within Functional Limits for tasks assessed                                        Exercises Total Joint Exercises Ankle Circles/Pumps: AROM;Both;15 reps;Supine Quad Sets: AROM;Both;10 reps;Supine Heel Slides: AAROM;Left;20 reps;Supine Hip ABduction/ADduction: AAROM;Left;15 reps;Supine    General Comments        Pertinent Vitals/Pain Pain Assessment: 0-10 Pain Score: 5  Pain Location: L hip  Pain Descriptors / Indicators: Sore Pain Intervention(s): Limited activity within patient's tolerance;Monitored during session;Premedicated before session;Ice applied    Home Living                      Prior Function            PT Goals (current goals can now be found in the care plan section) Acute Rehab PT Goals Patient Stated Goal: go home with family PT Goal Formulation: With patient Time For Goal Achievement: 08/02/18 Potential to Achieve Goals: Good Progress towards PT goals: Progressing toward goals    Frequency    7X/week      PT Plan Current plan remains appropriate    Co-evaluation              AM-PAC PT "6 Clicks" Mobility   Outcome Measure  Help needed turning from your back to your side while in a flat  bed without using bedrails?: A Little Help needed moving from lying on your back to sitting on the side of a flat bed without using bedrails?: A Little Help needed moving to and from a bed to a chair (including a wheelchair)?: A Little Help needed standing up from a chair using your arms (e.g., wheelchair or bedside chair)?: A Little Help needed to walk in hospital room?: A Little Help needed climbing 3-5 steps with a railing? : A Little 6 Click Score: 18    End of Session Equipment Utilized During Treatment: Gait belt Activity Tolerance: Patient tolerated treatment well Patient left: in chair;with chair alarm set;with call bell/phone within reach Nurse Communication: Mobility  status PT Visit Diagnosis: Other abnormalities of gait and mobility (R26.89);Difficulty in walking, not elsewhere classified (R26.2)     Time: 5188-4166 PT Time Calculation (min) (ACUTE ONLY): 27 min  Charges:  $Gait Training: 8-22 mins $Therapeutic Exercise: 8-22 mins                     [place] Pager [phone] Office [phone]    Alexis Cole 07/27/2018, 12:07 PM

## 2018-07-27 NOTE — Progress Notes (Signed)
Physical Therapy Treatment Patient Details Name: Alexis Cole MRN: [id number] DOB: [birth year] Today's Date: 07/27/2018    History of Present Illness 73 yo female s/p L DA-THA on 07/26/18. PMH includes PTSD, PNA, HTN, syncope, HLD, anxiety/depression, R THA, L TKR.     PT Comments    Pt continues to progress well with mobility and eager for dc home.  Pt ambulated increased distance, negotiated stairs, reviewed written home therex program and reviewed car transfers.   Follow Up Recommendations  Follow surgeon's recommendation for DC plan and follow-up therapies;Supervision for mobility/OOB     Equipment Recommendations  Other (comment)    Recommendations for Other Services       Precautions / Restrictions Precautions Precautions: Fall Restrictions Weight Bearing Restrictions: No Other Position/Activity Restrictions: WBAT    Mobility  Bed Mobility Overal bed mobility: Needs Assistance Bed Mobility: Sit to Supine     Supine to sit: Supervision Sit to supine: Min assist   General bed mobility comments: cues for sequence, min assist for L LE  Transfers Overall transfer level: Needs assistance Equipment used: Rolling walker (2 wheeled) Transfers: Sit to/from Stand Sit to Stand: Supervision         General transfer comment: cues for use of UEs to self assist  Ambulation/Gait Ambulation/Gait assistance: Min guard;Supervision Gait Distance (Feet): 300 Feet Assistive device: Rolling walker (2 wheeled);4-wheeled walker Gait Pattern/deviations: Step-through pattern;Decreased stride length;Trunk flexed Gait velocity: slightly decr   General Gait Details: cues for posture and position from AD; sup with RW, min guard with Rollator   Stairs Stairs: Yes Stairs assistance: Min assist Stair Management: No rails;Step to pattern;Forwards;With walker Number of Stairs: 4 General stair comments: single step twice with RW and twice with Rollator; cues for sequence and AD  management   Wheelchair Mobility    Modified Rankin (Stroke Patients Only)       Balance Overall balance assessment: Mild deficits observed, not formally tested                                          Cognition Arousal/Alertness: Awake/alert Behavior During Therapy: WFL for tasks assessed/performed Overall Cognitive Status: Within Functional Limits for tasks assessed                                        Exercises Total Joint Exercises Ankle Circles/Pumps: AROM;Both;15 reps;Supine Quad Sets: AROM;Both;10 reps;Supine Heel Slides: AAROM;Left;20 reps;Supine Hip ABduction/ADduction: AAROM;Left;15 reps;Supine    General Comments        Pertinent Vitals/Pain Pain Assessment: 0-10 Pain Score: 5  Pain Location: L hip  Pain Descriptors / Indicators: Sore Pain Intervention(s): Limited activity within patient's tolerance;Monitored during session;Premedicated before session;Ice applied    Home Living                      Prior Function            PT Goals (current goals can now be found in the care plan section) Acute Rehab PT Goals Patient Stated Goal: go home with family PT Goal Formulation: With patient Time For Goal Achievement: 08/02/18 Potential to Achieve Goals: Good Progress towards PT goals: Progressing toward goals    Frequency    7X/week      PT Plan Current plan remains appropriate  Co-evaluation              AM-PAC PT "6 Clicks" Mobility   Outcome Measure  Help needed turning from your back to your side while in a flat bed without using bedrails?: A Little Help needed moving from lying on your back to sitting on the side of a flat bed without using bedrails?: A Little Help needed moving to and from a bed to a chair (including a wheelchair)?: A Little Help needed standing up from a chair using your arms (e.g., wheelchair or bedside chair)?: A Little Help needed to walk in hospital room?: A  Little Help needed climbing 3-5 steps with a railing? : A Little 6 Click Score: 18    End of Session Equipment Utilized During Treatment: Gait belt Activity Tolerance: Patient tolerated treatment well Patient left: in bed;with call bell/phone within reach Nurse Communication: Mobility status PT Visit Diagnosis: Other abnormalities of gait and mobility (R26.89);Difficulty in walking, not elsewhere classified (R26.2)     Time: 5686-1683 PT Time Calculation (min) (ACUTE ONLY): 29 min  Charges:  $Gait Training: 8-22 mins $Therapeutic Exercise: 8-22 mins $Therapeutic Activity: 8-22 mins                     Debe Coder PT Acute Rehabilitation Services Pager [phone] Office [phone]    Nickol Collister 07/27/2018, 12:13 PM

## 2018-07-27 NOTE — TOC Initial Note (Signed)
Transition of Care [facility]) - Initial/Assessment Note    Patient Details  Name: Alexis Cole MRN: [id number] Date of Birth: 05-09-1945  [facility]) CM/SW Contact:    Joaquin Courts, RN Phone Number: 07/27/2018, 1:24 PM  Clinical Narrative:  CM spoke with patient at bedside. Patient reports brother is available to help her at home. Patient sates has a 3-in-1 at home, needs a rolator. Patient set up with [facility] for [place], [employer] to deliver rollator to home.                   Expected Discharge Plan: [place] Barriers to Discharge: No Barriers Identified   Patient Goals and CMS Choice Patient states their goals for this hospitalization and ongoing recovery are:: to go home CMS Medicare.gov Compare Post Acute Care list provided to:: Patient Choice offered to / list presented to : Patient  Expected Discharge Plan and Services Expected Discharge Plan: [place]   Discharge Planning Services: CM Consult Post Acute Care Choice: Home Health, Durable Medical Equipment Living arrangements for the past 2 months: Apartment Expected Discharge Date: 07/27/18               DME Arranged: Gilford Rile rolling with seat DME Agency: [employer] Date DME Agency Contacted: 07/27/18 Time DME Agency Contacted: 63 Representative spoke with at DME Agency: Fax(patient discharged home, will need delivered) HH Arranged: PT [place]: [facility] (formerly [employer]) Date [place]: 07/27/18 Time [place]: [place] Representative spoke with at [place]: Charlann Noss  Prior Living Arrangements/Services Living arrangements for the past 2 months: Apartment Lives with:: Self Patient language and need for interpreter reviewed:: Yes Do you feel safe going back to the place where you live?: Yes      Need for Family Participation in Patient Care: Yes (Comment) Care giver support system in place?: Yes (comment)    Criminal Activity/Legal Involvement Pertinent to Current Situation/Hospitalization: No - Comment as needed  Activities of Daily Living Home Assistive Devices/Equipment: Environmental consultant (specify type), Eyeglasses, Bedside commode/3-in-1, Cane (specify quad or straight), Dentures (specify type), Grab bars in shower ADL Screening (condition at time of admission) Patient's cognitive ability adequate to safely complete daily activities?: Yes Is the patient deaf or have difficulty hearing?: Yes Does the patient have difficulty seeing, even when wearing glasses/contacts?: No Does the patient have difficulty concentrating, remembering, or making decisions?: No Patient able to express need for assistance with ADLs?: No Does the patient have difficulty dressing or bathing?: No Independently performs ADLs?: Yes (appropriate for developmental age) Does the patient have difficulty walking or climbing stairs?: No Weakness of Legs: None Weakness of Arms/Hands: None  Permission Sought/Granted                  Emotional Assessment Appearance:: Appears stated age Attitude/Demeanor/Rapport: Engaged Affect (typically observed): Accepting Orientation: : Oriented to Self, Oriented to Place, Oriented to  Time, Oriented to Situation   Psych Involvement: No (comment)  Admission diagnosis:  LEFT ANTERIOR TOTAL HIP ARTHROPLASTY Patient Active Problem List   Diagnosis Date Noted  . Primary osteoarthritis of left hip 07/26/2018  . Saddle anesthesia 06/28/2018  . Primary osteoarthritis of left knee 03/09/2017  . Increased endometrial stripe thickness 06/13/2016  . Hyperparathyroidism, primary ([place]) 04/27/2016  . Hyperlipidemia 01/04/2015  . Breast mass in female 08/20/2014  . Acute recurrent sinusitis 08/13/2014  . Healthcare maintenance 06/24/2014  . Obesity 05/07/2014  .  Tobacco use disorder 04/24/2014  . Chronic meniscal tear of knee 04/17/2014  . Seasonal allergies 01/03/2013  . Osteoarthritis of  right hip 09/01/2011  . Essential hypertension, benign 03/14/2011  . ANXIETY DEPRESSION 02/10/2009  . LOW BACK PAIN, CHRONIC 01/08/2008  . Arthritis 07/02/2007   PCP:  Patriciaann Clan, DO Pharmacy:   [id number] - [place], [place] [address] [place] [address] Phone: [phone] Fax: [phone]  [place] [address] [place] - [address] [place] [place] [address] Phone: [phone] Fax: [phone]     Social Determinants of Health (SDOH) Interventions    Readmission Risk Interventions No flowsheet data found.

## 2018-07-27 NOTE — Progress Notes (Signed)
    Home health agencies that serve [id number].        [place] Quality of Patient Care Rating Patient Survey Summary Rating  [facility] [phone] 4 out of 5 stars 4 out of [place] [phone] 3 out of 5 stars 4 out of [place] [phone] 4  out of 5 stars 3 out of [place] [phone] 4  out of 5 stars 4 out of [place] [phone] 4 out of 5 stars 4 out of [place] [phone] 4 out of 5 stars 4 out of 5 stars  [facility] [phone] 3  out of 5 stars 4 out of [place] [phone] 3 out of 5 stars 4 out of 5 stars  [facility] ([phone]) [phone] 4 out of 5 stars Not [place] [phone] 3 out of 5 stars 4 out of 5 stars  [facility] [phone] 3  out of 5 stars 3 out of [place] [phone] 3  out of 5 stars 4 out of [place] [phone] 3  out of 5 stars 3 out of [place] [phone] 3  out of 5 stars Not [place] [phone] 4  out of 5 stars 3 out of [place] number Footnote as displayed on [place]  1 This agency provides services under a federal waiver program to non-traditional, chronic long term population.  2 This agency provides services to a special needs population.  3 Not Available.  4 The number of patient episodes for this measure is too small to report.  5 This measure currently does not have data or provider has been certified/recertified for less than 6 months.  6 The national average for this measure is not provided because of state-to-state  differences in data collection.  7 Medicare is not displaying rates for this measure for any home health agency, because of an issue with the data.  8 There were problems with the data and they are being corrected.  9 Zero, or very few, patients met the survey's rules for inclusion. The scores shown, if any, reflect a very small number of surveys and may not accurately tell how an agency is doing.  10 Survey results are based on less than 12 months of data.  11 Fewer than 70 patients completed the survey. Use the scores shown, if any, with caution as the number of surveys may be too low to accurately tell how an agency is doing.  12 No survey results are available for this period.  13 Data suppressed by CMS for one or more quarters.

## 2018-07-27 NOTE — Progress Notes (Signed)
Subjective: 1 Day Post-Op Procedure(s) (LRB): TOTAL HIP ARTHROPLASTY ANTERIOR APPROACH (Left)   Patient doing well and is wanting to go home today  Activity level:  wbat Diet tolerance:  ok Voiding:  ok Patient reports pain as mild.    Objective: Vital signs in last 24 hours: Temp:  [97.6 F (36.4 C)-99 F (37.2 C)] 97.6 F (36.4 C) (06/06 0548) Pulse Rate:  [72-94] 78 (06/06 0548) Resp:  [12-22] 18 (06/06 0548) BP: (92-137)/(46-101) 116/64 (06/06 0548) SpO2:  [94 %-100 %] 97 % (06/06 0548) Weight:  [91.7 kg] 91.7 kg (06/05 1010)  Labs: Recent Labs    07/27/18 0311  HGB 12.1   Recent Labs    07/27/18 0311  WBC 16.3*  RBC 4.03  HCT 39.4  PLT 195   No results for input(s): NA, K, CL, CO2, BUN, CREATININE, GLUCOSE, CALCIUM in the last 72 hours. No results for input(s): LABPT, INR in the last 72 hours.  Physical Exam:  Neurologically intact ABD soft Neurovascular intact Sensation intact distally Intact pulses distally Dorsiflexion/Plantar flexion intact Incision: dressing C/D/I and no drainage No cellulitis present Compartment soft  Assessment/Plan:  1 Day Post-Op Procedure(s) (LRB): TOTAL HIP ARTHROPLASTY ANTERIOR APPROACH (Left) Advance diet Up with therapy D/C IV fluids Discharge home with home health if doing well and cleared by PT. Continue on ASA 325mg  BID x 4 weeks post op. Follow up in office 2 weeks post op  Alexis Cole Tyrann Donaho 07/27/2018, 8:00 AM

## 2018-07-28 DIAGNOSIS — I700 Atherosclerosis of aorta: Secondary | ICD-10-CM | POA: Diagnosis not present

## 2018-07-28 DIAGNOSIS — E785 Hyperlipidemia, unspecified: Secondary | ICD-10-CM | POA: Diagnosis not present

## 2018-07-28 DIAGNOSIS — Z96652 Presence of left artificial knee joint: Secondary | ICD-10-CM | POA: Diagnosis not present

## 2018-07-28 DIAGNOSIS — G8929 Other chronic pain: Secondary | ICD-10-CM | POA: Diagnosis not present

## 2018-07-28 DIAGNOSIS — Z96643 Presence of artificial hip joint, bilateral: Secondary | ICD-10-CM | POA: Diagnosis not present

## 2018-07-28 DIAGNOSIS — Z471 Aftercare following joint replacement surgery: Secondary | ICD-10-CM | POA: Diagnosis not present

## 2018-07-28 DIAGNOSIS — I10 Essential (primary) hypertension: Secondary | ICD-10-CM | POA: Diagnosis not present

## 2018-07-28 DIAGNOSIS — M549 Dorsalgia, unspecified: Secondary | ICD-10-CM | POA: Diagnosis not present

## 2018-07-29 ENCOUNTER — Encounter (HOSPITAL_COMMUNITY): Payer: Self-pay | Admitting: Orthopedic Surgery

## 2018-07-29 DIAGNOSIS — M1612 Unilateral primary osteoarthritis, left hip: Secondary | ICD-10-CM | POA: Diagnosis not present

## 2018-07-29 NOTE — Discharge Summary (Signed)
Patient ID: ALENAH SARRIA MRN: [id number] DOB/AGE: 08-18-1945 73 y.o.  Admit date: 07/26/2018 Discharge date: 07/27/2018  Admission Diagnoses:  Principal Problem:   Primary osteoarthritis of left hip   Discharge Diagnoses:  Same  Past Medical History:  Diagnosis Date  . Acne    but not being treated for  . Anemia    history of--many yrs ago  . Anxiety    but doesn't take any meds for this  . Aortic atherosclerosis ([place]) 06/08/2016   noted on CT abd/pelvis  . Arthritis   . Arthropathy, lower leg 02/10/2009   For eye care pt is going to Dr. Katy Fitch, and her Ortho surgeon is Dr. Dorna Leitz and [facility].    . Back pain 11/17/2015  . Chronic back pain   . Complication of anesthesia    States she woke up just after parathyroid surgery while still in OR and attempted to get off the bed, remebers seeing staffs back towards her  . Depression   . Diarrhea   . Diverticulitis 06/08/2016   noted on CT abd/pelvis  . Diverticulosis 06/08/2016   noted on CT abd/pelvis  . Dyslipidemia   . Dyspnea 10/26/2017  . Estrogen deficiency 08/13/2014  . Fatigue 02/24/2016  . Fatty liver 06/08/2016   severe noted on CT abd/pelvis   . Gastric ulcer    history of--many yrs ago  . General weakness 06/08/2016  . Headache(784.0)    History of migraine  . Heart murmur   . History of bronchitis    last time 62months  . History of syncope   . Hx of migraines    last one a month ago  . Hypercalcemia 12/12/2016  . Hyperparathyroidism ([facility]) 12/12/2016  . Hypertension     Dr     Higinio Plan  . Insomnia    Nyquil nightly  . Joint pain   . Joint swelling   . Lactose intolerance   . Nausea and vomiting   . Obesity   . Pneumonia 03/15/2016   left lower lobe, left lower lobe atelectasis, elevated left hemidiaphragm noted again, small left pleural effusion  . Pre-syncope 04/12/2016  . PTSD (post-traumatic stress disorder)   . Sinusitis   . Urge incontinence of urine     Surgeries: Procedure(s): TOTAL  HIP ARTHROPLASTY ANTERIOR APPROACH on 07/26/2018   Consultants:   Discharged Condition: Improved  Hospital Course: SHUNDA RABADI is an 73 y.o. female who was admitted 07/26/2018 for operative treatment ofPrimary osteoarthritis of left hip. Patient has severe unremitting pain that affects sleep, daily activities, and work/hobbies. After pre-op clearance the patient was taken to the operating room on 07/26/2018 and underwent  Procedure(s): TOTAL HIP ARTHROPLASTY ANTERIOR APPROACH.    Patient was given perioperative antibiotics:  Anti-infectives (From admission, onward)   Start     Dose/Rate Route Frequency Ordered Stop   07/26/18 1800  ceFAZolin (ANCEF) IVPB 2g/100 mL premix     2 g 200 mL/hr over 30 Minutes Intravenous Every 6 hours 07/26/18 1607 07/27/18 0035   07/26/18 1000  ceFAZolin (ANCEF) IVPB 2g/100 mL premix     2 g 200 mL/hr over 30 Minutes Intravenous On call to O.R. 07/26/18 0957 07/26/18 1230       Patient was given sequential compression devices, early ambulation, and chemoprophylaxis to prevent DVT.  Patient benefited maximally from hospital stay and there were no complications.    Recent vital signs: No data found.   Recent laboratory studies:  Recent Labs  07/27/18 0311  WBC 16.3*  HGB 12.1  HCT 39.4  PLT 195     Discharge Medications:   Allergies as of 07/27/2018      Reactions   Tape Rash      Medication List    STOP taking these medications   etodolac 400 MG tablet Commonly known as:  LODINE   HYDROcodone-acetaminophen 5-325 MG tablet Commonly known as:  NORCO/VICODIN     TAKE these medications   amLODipine 10 MG tablet Commonly known as:  NORVASC Take 1 tablet (10 mg total) by mouth daily.   Aspercreme Max Roll-On 16 % Liqd Generic drug:  Menthol (Topical Analgesic) Apply 1 application topically daily as needed (pain).   aspirin EC 325 MG tablet Take 1 tablet (325 mg total) by mouth 2 (two) times daily after a meal. Take x 1 month post op  to decrease risk of blood clots.   atorvastatin 40 MG tablet Commonly known as:  LIPITOR Take 1 tablet (40 mg total) by mouth daily.   BIOTIN PO Take 1 tablet by mouth at bedtime.   busPIRone 10 MG tablet Commonly known as:  BUSPAR Take 1 tablet (10 mg total) by mouth 2 (two) times daily.   Co Q-10 100 MG Caps Take 100 mg by mouth at bedtime.   docusate sodium 100 MG capsule Commonly known as:  Colace Take 1 capsule (100 mg total) by mouth 2 (two) times daily.   escitalopram 5 MG tablet Commonly known as:  Lexapro Take 1 tablet (5 mg total) by mouth daily.   fluticasone 50 MCG/ACT nasal spray Commonly known as:  FLONASE Place 1 spray into both nostrils daily as needed for allergies or rhinitis.   FOLIC ACID PO Take 1 tablet by mouth at bedtime.   gabapentin 300 MG capsule Commonly known as:  NEURONTIN Take 1 capsule (300 mg total) by mouth 3 (three) times daily.   HEALTHY EYES PO Take 1 tablet by mouth at bedtime.   Krill Oil 350 MG Caps Take 350 mg by mouth at bedtime.   lisinopril 20 MG tablet Commonly known as:  ZESTRIL Take 1 tablet (20 mg total) by mouth daily.   NYQUIL PO Take 30 mLs by mouth at bedtime as needed (allergies/insomnia).   oxyCODONE-acetaminophen 5-325 MG tablet Commonly known as:  PERCOCET/ROXICET Take 1-2 tablets by mouth every 6 (six) hours as needed for severe pain.   oxymetazoline 0.05 % nasal spray Commonly known as:  AFRIN Place 1 spray into both nostrils 2 (two) times daily as needed for congestion.   ProAir HFA 108 (90 Base) MCG/ACT inhaler Generic drug:  albuterol INHALE 2-4 PUFFS INTO THE LUNGS EVERY 4 (FOUR) HOURS AS NEEDED FOR WHEEZING (OR COUGH). What changed:  See the new instructions.   SALONPAS PAIN RELIEF PATCH EX Place 1 patch onto the skin daily as needed (pain).   tiZANidine 2 MG tablet Commonly known as:  ZANAFLEX Take 1 tablet (2 mg total) by mouth every 8 (eight) hours as needed for muscle spasms.    Vitamin D3 50 MCG (2000 UT) capsule Take 2,000 Units by mouth at bedtime.       Diagnostic Studies: Mr Lumbar Spine Wo Contrast  Result Date: 07/03/2018 CLINICAL DATA:  73 year old female with chronic low back pain but new numbness in the right groin buttock and leg for 1 month. EXAM: MRI LUMBAR SPINE WITHOUT CONTRAST TECHNIQUE: Multiplanar, multisequence MR imaging of the lumbar spine was performed. No intravenous contrast was administered. COMPARISON:  CT Abdomen and Pelvis 06/08/2016, lumbar radiographs 04/02/2011. FINDINGS: Segmentation:  Normal on the comparison CT. Alignment: Stable lumbar lordosis since 2018. Chronic grade 1 anterolisthesis of L4 on L5. Mild chronic retrolisthesis of L1 on L2. Subtle chronic anterolisthesis of L5 on S1. Vertebrae: Mild degenerative appearing marrow edema in the left L5 facet. Chronic lower lumbar facet marrow signal changes otherwise. No other No marrow edema or evidence of acute osseous abnormality. Intact visible sacrum and SI joints. Conus medullaris and cauda equina: Conus extends to the L1-L2 level. No lower spinal cord or conus signal abnormality. Paraspinal and other soft tissues: Negative visible abdominal viscera. There is ectasia of the infrarenal abdominal aorta measuring up to 25 millimeters. There is mild-to-moderate right paraspinal soft tissue inflammation with increased STIR signal on series 8, image 2. There are degenerative synovial cysts in this region. The other paraspinal soft tissues appear normal. Disc levels: T11-T12: Chronic vacuum disc and severe disc space loss with circumferential disc bulge and mild facet hypertrophy. No spinal stenosis. Mild to moderate left T11 foraminal stenosis. T12-L1: Chronic vacuum disc and disc space loss with left eccentric circumferential disc bulge. Mild to moderate facet hypertrophy. Borderline to mild spinal stenosis. Mild left T12 foraminal stenosis. L1-L2: Mild chronic retrolisthesis. There is a disc  extrusion in the right L1 lateral epidural space tracking toward or into the right L1 neural foramen. This is best seen on series 14, images 11 and 12, also visible on series 6, image 4. It is unclear which disc space this originated from, but I favor the L1-L2 disc. Superimposed moderate to severe facet hypertrophy at this level with mild chronic retrolisthesis. Mild spinal stenosis at the disc space level in part related to epidural lipomatosis. Moderate to severe stenosis along the course of the descending right L1 nerve roots and right L1 neural foramen. L2-L3: Mild disc bulge but moderate to severe facet hypertrophy. Posteriorly situated synovial cyst as seen on series 14, image 22. Borderline to mild spinal and bilateral L2 foraminal stenosis. L3-L4: Mild disc bulge but moderate to severe facet and ligament flavum hypertrophy. Degenerative facet joint fluid and right greater than left posteriorly situated synovial cysts. Associated right paraspinal soft tissue inflammation at this level. Mild spinal stenosis. No convincing foraminal or lateral recess stenosis. L4-L5: Chronic anterolisthesis. Circumferential disc bulge with severe facet hypertrophy. Mild ligament flavum hypertrophy. Degenerative facet joint fluid. Mild spinal, bilateral lateral recess and bilateral L4 foraminal stenosis. L5-S1: Mild disc bulge with small left foraminal disc protrusion with annular fissure best seen on series 6, image 9. Chronic severe facet hypertrophy, and evidence of developing facet ankylosis on the left (series 14, image 41). No spinal or lateral recess stenosis. Normal right foramen. IMPRESSION: 1. The symptomatic level is probably L1-L2 where an extruded and small sequestered disc fragment is located in the right lateral epidural space at L1 and tracking toward or into the right L1 neural foramen. This probably originated from the L1-L2 disc. Query right L1 radiculitis. 2. Advanced lower thoracic disc and lumbar facet  arthropathy otherwise. Developing facet ankylosis on the left at L5-S1. Chronic grade 1 anterolisthesis at L4-L5 with severe facet arthropathy the air and moderate to severe at L2-L3 and L3-L4. Posteriorly situated degenerative synovial cysts, mostly on the right and associated with some right lumbar paraspinal soft tissue inflammation as seen on series 8, image 2. 3. Multifactorial mild spinal stenosis at T12-L1 through L4-L5. Electronically Signed   By: Genevie Ann M.D.   On: 07/03/2018 07:40  Dg C-arm 1-60 Min-no Report  Result Date: 07/26/2018 Fluoroscopy was utilized by the requesting physician.  No radiographic interpretation.   Dg Hip Operative Unilat W Or W/o Pelvis Left  Result Date: 07/26/2018 CLINICAL DATA:  LEFT hip replacement EXAM: OPERATIVE LEFT HIP (WITH PELVIS IF PERFORMED) 2 VIEWS TECHNIQUE: Fluoroscopic spot image(s) were submitted for interpretation post-operatively. COMPARISON:  None FLUOROSCOPY TIME:  0 minutes 22 seconds Images obtained: 2 Dose: 2.75 mGy FINDINGS: Components of a LEFT hip prosthesis are identified. No fracture or dislocation. RIGHT hip prosthesis is also noted. Bones appear demineralized. IMPRESSION: LEFT hip prosthesis without acute radiographic complication. Electronically Signed   By: Lavonia Dana M.D.   On: 07/26/2018 14:28    Disposition:   Discharge Instructions    Call MD / Call 911   Complete by:  As directed    If you experience chest pain or shortness of breath, CALL 911 and be transported to the hospital emergency room.  If you develope a fever above 101 F, pus (white drainage) or increased drainage or redness at the wound, or calf pain, call your surgeon's office.   Constipation Prevention   Complete by:  As directed    Drink plenty of fluids.  Prune juice may be helpful.  You may use a stool softener, such as Colace (over the counter) 100 mg twice a day.  Use MiraLax (over the counter) for constipation as needed.   Diet - low sodium heart healthy    Complete by:  As directed    Discharge instructions   Complete by:  As directed    INSTRUCTIONS AFTER JOINT REPLACEMENT   Remove items at home which could result in a fall. This includes throw rugs or furniture in walking pathways ICE to the affected joint every three hours while awake for 30 minutes at a time, for at least the first 3-5 days, and then as needed for pain and swelling.  Continue to use ice for pain and swelling. You may notice swelling that will progress down to the foot and ankle.  This is normal after surgery.  Elevate your leg when you are not up walking on it.   Continue to use the breathing machine you got in the hospital (incentive spirometer) which will help keep your temperature down.  It is common for your temperature to cycle up and down following surgery, especially at night when you are not up moving around and exerting yourself.  The breathing machine keeps your lungs expanded and your temperature down.   DIET:  As you were doing prior to hospitalization, we recommend a well-balanced diet.  DRESSING / WOUND CARE / SHOWERING  You may shower 3 days after surgery, but keep the wounds dry during showering.  You may use an occlusive plastic wrap (Press'n Seal for example), NO SOAKING/SUBMERGING IN THE BATHTUB.  If the bandage gets wet, change with a clean dry gauze.  If the incision gets wet, pat the wound dry with a clean towel.  ACTIVITY  Increase activity slowly as tolerated, but follow the weight bearing instructions below.   No driving for 6 weeks or until further direction given by your physician.  You cannot drive while taking narcotics.  No lifting or carrying greater than 10 lbs. until further directed by your surgeon. Avoid periods of inactivity such as sitting longer than an hour when not asleep. This helps prevent blood clots.  You may return to work once you are authorized by your doctor.  WEIGHT BEARING   Weight bearing as tolerated with assist  device (walker, cane, etc) as directed, use it as long as suggested by your surgeon or therapist, typically at least 4-6 weeks.   EXERCISES  Results after joint replacement surgery are often greatly improved when you follow the exercise, range of motion and muscle strengthening exercises prescribed by your doctor. Safety measures are also important to protect the joint from further injury. Any time any of these exercises cause you to have increased pain or swelling, decrease what you are doing until you are comfortable again and then slowly increase them. If you have problems or questions, call your caregiver or physical therapist for advice.   Rehabilitation is important following a joint replacement. After just a few days of immobilization, the muscles of the leg can become weakened and shrink (atrophy).  These exercises are designed to build up the tone and strength of the thigh and leg muscles and to improve motion. Often times heat used for twenty to thirty minutes before working out will loosen up your tissues and help with improving the range of motion but do not use heat for the first two weeks following surgery (sometimes heat can increase post-operative swelling).   These exercises can be done on a training (exercise) mat, on the floor, on a table or on a bed. Use whatever works the best and is most comfortable for you.    Use music or television while you are exercising so that the exercises are a pleasant break in your day. This will make your life better with the exercises acting as a break in your routine that you can look forward to.   Perform all exercises about fifteen times, three times per day or as directed.  You should exercise both the operative leg and the other leg as well.   Exercises include:   Quad Sets - Tighten up the muscle on the front of the thigh (Quad) and hold for 5-10 seconds.   Straight Leg Raises - With your knee straight (if you were given a brace, keep it on),  lift the leg to 60 degrees, hold for 3 seconds, and slowly lower the leg.  Perform this exercise against resistance later as your leg gets stronger.  Leg Slides: Lying on your back, slowly slide your foot toward your buttocks, bending your knee up off the floor (only go as far as is comfortable). Then slowly slide your foot back down until your leg is flat on the floor again.  Angel Wings: Lying on your back spread your legs to the side as far apart as you can without causing discomfort.  Hamstring Strength:  Lying on your back, push your heel against the floor with your leg straight by tightening up the muscles of your buttocks.  Repeat, but this time bend your knee to a comfortable angle, and push your heel against the floor.  You may put a pillow under the heel to make it more comfortable if necessary.   A rehabilitation program following joint replacement surgery can speed recovery and prevent re-injury in the future due to weakened muscles. Contact your doctor or a physical therapist for more information on knee rehabilitation.    CONSTIPATION  Constipation is defined medically as fewer than three stools per week and severe constipation as less than one stool per week.  Even if you have a regular bowel pattern at home, your normal regimen is likely to be disrupted due to multiple reasons following surgery.  Combination of anesthesia, postoperative narcotics, change in appetite and fluid intake all can affect your bowels.   YOU MUST use at least one of the following options; they are listed in order of increasing strength to get the job done.  They are all available over the counter, and you may need to use some, POSSIBLY even all of these options:    Drink plenty of fluids (prune juice may be helpful) and high fiber foods Colace 100 mg by mouth twice a day  Senokot for constipation as directed and as needed Dulcolax (bisacodyl), take with full glass of water  Miralax (polyethylene glycol) once  or twice a day as needed.  If you have tried all these things and are unable to have a bowel movement in the first 3-4 days after surgery call either your surgeon or your primary doctor.    If you experience loose stools or diarrhea, hold the medications until you stool forms back up.  If your symptoms do not get better within 1 week or if they get worse, check with your doctor.  If you experience "the worst abdominal pain ever" or develop nausea or vomiting, please contact the office immediately for further recommendations for treatment.   ITCHING:  If you experience itching with your medications, try taking only a single pain pill, or even half a pain pill at a time.  You can also use Benadryl over the counter for itching or also to help with sleep.   TED HOSE STOCKINGS:  Use stockings on both legs until for at least 2 weeks or as directed by physician office. They may be removed at night for sleeping.  MEDICATIONS:  See your medication summary on the "After Visit Summary" that nursing will review with you.  You may have some home medications which will be placed on hold until you complete the course of blood thinner medication.  It is important for you to complete the blood thinner medication as prescribed.  PRECAUTIONS:  If you experience chest pain or shortness of breath - call 911 immediately for transfer to the hospital emergency department.   If you develop a fever greater that 101 F, purulent drainage from wound, increased redness or drainage from wound, foul odor from the wound/dressing, or calf pain - CONTACT YOUR SURGEON.                                                   FOLLOW-UP APPOINTMENTS:  If you do not already have a post-op appointment, please call the office for an appointment to be seen by your surgeon.  Guidelines for how soon to be seen are listed in your "After Visit Summary", but are typically between 1-4 weeks after surgery.  OTHER INSTRUCTIONS:   Knee Replacement:  Do  not place pillow under knee, focus on keeping the knee straight while resting. CPM instructions: 0-90 degrees, 2 hours in the morning, 2 hours in the afternoon, and 2 hours in the evening. Place foam block, curve side up under heel at all times except when in CPM or when walking.  DO NOT modify, tear, cut, or change the foam block in any way.  MAKE SURE YOU:  Understand these instructions.  Get help right away if you are not doing well or get worse.    Thank you for letting us be a part  of your medical care team.  It is a privilege we respect greatly.  We hope these instructions will help you stay on track for a fast and full recovery!   Increase activity slowly as tolerated   Complete by:  As directed       Follow-up Information    Dorna Leitz, MD. Schedule an appointment as soon as possible for a visit in 2 weeks.   Specialty:  Orthopedic Surgery Contact information: [place] [address] [phone]        [facility] Follow up.   Specialty:  [facility]:  home health physical therapy, agency will contcat you to set up initial visit Contact information: [place] [address] [phone]            Signed: Larwance Sachs Derionna Salvador 07/29/2018, 10:40 AM

## 2018-07-31 DIAGNOSIS — I10 Essential (primary) hypertension: Secondary | ICD-10-CM | POA: Diagnosis not present

## 2018-07-31 DIAGNOSIS — M549 Dorsalgia, unspecified: Secondary | ICD-10-CM | POA: Diagnosis not present

## 2018-07-31 DIAGNOSIS — Z96652 Presence of left artificial knee joint: Secondary | ICD-10-CM | POA: Diagnosis not present

## 2018-07-31 DIAGNOSIS — E785 Hyperlipidemia, unspecified: Secondary | ICD-10-CM | POA: Diagnosis not present

## 2018-07-31 DIAGNOSIS — I700 Atherosclerosis of aorta: Secondary | ICD-10-CM | POA: Diagnosis not present

## 2018-07-31 DIAGNOSIS — Z96643 Presence of artificial hip joint, bilateral: Secondary | ICD-10-CM | POA: Diagnosis not present

## 2018-07-31 DIAGNOSIS — Z471 Aftercare following joint replacement surgery: Secondary | ICD-10-CM | POA: Diagnosis not present

## 2018-07-31 DIAGNOSIS — G8929 Other chronic pain: Secondary | ICD-10-CM | POA: Diagnosis not present

## 2018-08-02 DIAGNOSIS — G8929 Other chronic pain: Secondary | ICD-10-CM | POA: Diagnosis not present

## 2018-08-02 DIAGNOSIS — Z96652 Presence of left artificial knee joint: Secondary | ICD-10-CM | POA: Diagnosis not present

## 2018-08-02 DIAGNOSIS — I10 Essential (primary) hypertension: Secondary | ICD-10-CM | POA: Diagnosis not present

## 2018-08-02 DIAGNOSIS — I700 Atherosclerosis of aorta: Secondary | ICD-10-CM | POA: Diagnosis not present

## 2018-08-02 DIAGNOSIS — Z96643 Presence of artificial hip joint, bilateral: Secondary | ICD-10-CM | POA: Diagnosis not present

## 2018-08-02 DIAGNOSIS — E785 Hyperlipidemia, unspecified: Secondary | ICD-10-CM | POA: Diagnosis not present

## 2018-08-02 DIAGNOSIS — M549 Dorsalgia, unspecified: Secondary | ICD-10-CM | POA: Diagnosis not present

## 2018-08-02 DIAGNOSIS — Z471 Aftercare following joint replacement surgery: Secondary | ICD-10-CM | POA: Diagnosis not present

## 2018-08-02 NOTE — Brief Op Note (Signed)
07/26/2018  9:47 AM  PATIENT:  Alexis Cole  73 y.o. female  PRE-OPERATIVE DIAGNOSIS:  LEFT ANTERIOR TOTAL HIP ARTHROPLASTY  POST-OPERATIVE DIAGNOSIS:  LEFT ANTERIOR TOTAL HIP ARTHROPLASTY  PROCEDURE:  Procedure(s): TOTAL HIP ARTHROPLASTY ANTERIOR APPROACH (Left)  SURGEON:  Surgeon(s) and Role:    Dorna Leitz, MD - Primary  PHYSICIAN ASSISTANT:   ASSISTANTS: jimj bethune   ANESTHESIA:   spinal  EBL:  175 mL   BLOOD ADMINISTERED:none  DRAINS: none   LOCAL MEDICATIONS USED:  MARCAINE    and OTHER experel  SPECIMEN:  No Specimen  DISPOSITION OF SPECIMEN:  N/A  COUNTS:  YES  TOURNIQUET:  * No tourniquets in log *  DICTATION: .Other Dictation: Dictation Number [id number]  PLAN OF CARE: Admit for overnight observation  PATIENT DISPOSITION:  PACU - hemodynamically stable.   Delay start of Pharmacological VTE agent (>24hrs) due to surgical blood loss or risk of bleeding: no

## 2018-08-02 NOTE — Op Note (Signed)
NAME: Alexis Cole, Alexis Cole. MEDICAL RECORD TI:[id number] ACCOUNT [id number] DATE OF BIRTH:1945-10-23 FACILITY: [facility] LOCATION: [facility] PHYSICIAN:Mordecai Tindol L. Shaina Gullatt, MD  OPERATIVE REPORT  DATE OF PROCEDURE:  07/26/2018  PREOPERATIVE DIAGNOSIS:  End-stage degenerative joint disease, left hip.  POSTOPERATIVE DIAGNOSIS:  End-stage degenerative joint disease, left hip.  PROCEDURE: 1.  Left total hip replacement with an ACTIS stem size 6, 50 mm Pinnacle cup porous-coated, 32 mm delta ceramic hip ball +1.5, and a neutral liner. 2.  Interpretation of multiple intraoperative fluoroscopic images.   SURGEON:  Dorna Leitz, MD  ASSISTANT:  Gaspar Skeeters PA-C, was present for the entire case and assisted by manipulation of the leg and manipulation of tissue and closing to minimize OR time.  BRIEF HISTORY:  The patient is a 73 year old female with a long history of significant complaints of left hip pain.  She has been treated conservatively for a prolonged period of time and has failed all conservative care.  She was taken to the operating  room for left total hip replacement.  Preoperative x-ray showed bone-on-bone change as she was having night pain and light activity pain.    DESCRIPTION OF PROCEDURE:  The patient was taken to the operating room after adequate anesthesia was obtained with a spinal anesthetic and placed supine on the table and then onto the Hana bed.  All bony prominences were well padded.  Attention was  turned to the left hip where after routine prep and drape an incision was made for an anterior approach to the hip.  Subcutaneous tissue was dissected down to the level of the tensor fascia, which was identified and then divided in line with its fibers.   The muscle was finger dissected and retractors were put in place above and below the femoral neck.  The capsule was opened and tagged, and a provisional neck cut was made.  Retractors were put in place above and below the acetabulum. The  acetabulum was  sequentially reamed to a level of 49 mm, and a 50 mm Pinnacle cup was hammered into place with 45 degrees of lateral opening and 30 degrees of anteversion.  Once this was completed, attention was turned towards the stem where it was externally rotated,  extended and adducted.  The canal was opened and sequentially rasped up to a level of 6 with an active stem.  The trial reduction was undertaken with a +0 hip ball.  Excellent range of motion and stability were achieved.  Leg lengths were symmetric.  At  this point, the final 6 ACTIS stem was opened and placed followed by a +1.5, 32 mm delta ceramic hip ball, and the hip was reduced.  A neutral liner had been placed once the acetabulum had been placed.  Once the hip was reduced, the capsule was closed  with 1 Vicryl running.  The tensor fascia was closed with 0 Vicryl running, the skin with 0 and 2-0 Vicryl and 3-0 Monocryl subcuticular.  Benzoin and Steri-Strips were applied.  A sterile compressive dressing was applied, and the patient was taken to  recovery.  She was noted to be in satisfactory condition.  Estimated blood loss for the procedure was about 200 mL.  Final can be gotten from the anesthetic record.  Of note, fluoroscopy was used throughout the case to assess the leg lengths and  positioning of the components.  Gaspar Skeeters was present for the entire case and assisted by retraction of tissues, etc., and closed to minimize OR time.  LN/NUANCE  D:08/02/2018  T:08/02/2018 JOB:[id number]

## 2018-08-05 DIAGNOSIS — G8929 Other chronic pain: Secondary | ICD-10-CM | POA: Diagnosis not present

## 2018-08-05 DIAGNOSIS — Z471 Aftercare following joint replacement surgery: Secondary | ICD-10-CM | POA: Diagnosis not present

## 2018-08-05 DIAGNOSIS — Z96643 Presence of artificial hip joint, bilateral: Secondary | ICD-10-CM | POA: Diagnosis not present

## 2018-08-05 DIAGNOSIS — M549 Dorsalgia, unspecified: Secondary | ICD-10-CM | POA: Diagnosis not present

## 2018-08-05 DIAGNOSIS — I700 Atherosclerosis of aorta: Secondary | ICD-10-CM | POA: Diagnosis not present

## 2018-08-05 DIAGNOSIS — M1612 Unilateral primary osteoarthritis, left hip: Secondary | ICD-10-CM | POA: Diagnosis not present

## 2018-08-05 DIAGNOSIS — I10 Essential (primary) hypertension: Secondary | ICD-10-CM | POA: Diagnosis not present

## 2018-08-05 DIAGNOSIS — E785 Hyperlipidemia, unspecified: Secondary | ICD-10-CM | POA: Diagnosis not present

## 2018-08-05 DIAGNOSIS — Z96652 Presence of left artificial knee joint: Secondary | ICD-10-CM | POA: Diagnosis not present

## 2018-08-08 DIAGNOSIS — E785 Hyperlipidemia, unspecified: Secondary | ICD-10-CM | POA: Diagnosis not present

## 2018-08-08 DIAGNOSIS — Z471 Aftercare following joint replacement surgery: Secondary | ICD-10-CM | POA: Diagnosis not present

## 2018-08-08 DIAGNOSIS — M549 Dorsalgia, unspecified: Secondary | ICD-10-CM | POA: Diagnosis not present

## 2018-08-08 DIAGNOSIS — I700 Atherosclerosis of aorta: Secondary | ICD-10-CM | POA: Diagnosis not present

## 2018-08-08 DIAGNOSIS — G8929 Other chronic pain: Secondary | ICD-10-CM | POA: Diagnosis not present

## 2018-08-08 DIAGNOSIS — I10 Essential (primary) hypertension: Secondary | ICD-10-CM | POA: Diagnosis not present

## 2018-08-08 DIAGNOSIS — Z96652 Presence of left artificial knee joint: Secondary | ICD-10-CM | POA: Diagnosis not present

## 2018-08-08 DIAGNOSIS — Z96643 Presence of artificial hip joint, bilateral: Secondary | ICD-10-CM | POA: Diagnosis not present

## 2018-08-13 ENCOUNTER — Other Ambulatory Visit: Payer: Self-pay

## 2018-08-13 DIAGNOSIS — R0600 Dyspnea, unspecified: Secondary | ICD-10-CM

## 2018-08-15 DIAGNOSIS — H353231 Exudative age-related macular degeneration, bilateral, with active choroidal neovascularization: Secondary | ICD-10-CM | POA: Diagnosis not present

## 2018-08-15 MED ORDER — ALBUTEROL SULFATE HFA 108 (90 BASE) MCG/ACT IN AERS: 2.0000 | INHALATION_SPRAY | RESPIRATORY_TRACT | 1 refills | Status: DC | PRN

## 2018-09-05 DIAGNOSIS — M1711 Unilateral primary osteoarthritis, right knee: Secondary | ICD-10-CM | POA: Diagnosis not present

## 2018-09-09 ENCOUNTER — Other Ambulatory Visit: Payer: Self-pay

## 2018-09-09 ENCOUNTER — Ambulatory Visit (INDEPENDENT_AMBULATORY_CARE_PROVIDER_SITE_OTHER): Payer: Medicare Other

## 2018-09-09 VITALS — BP 114/70 | Ht 66.0 in | Wt 197.0 lb

## 2018-09-09 DIAGNOSIS — Z0000 Encounter for general adult medical examination without abnormal findings: Secondary | ICD-10-CM

## 2018-09-09 NOTE — Patient Instructions (Signed)
You spoke to Alexis Cole, [place] over the phone for your annual wellness visit.  We discussed goals: Goals    . Blood Pressure < 150/90    . Quit smoking / using tobacco     Patient has been smoking 1/2PPD due to covid.    . Weight (lb) < 180 lb (81.6 kg)     Patient has lost 8 pounds and wants to lose another 8 more.        We also discussed recommended health maintenance. Please call our office and schedule a visit. As discussed, you are up to date with pretty much everything. Schedule office visit in the fall for your annual flu vaccine.   Health Maintenance  Topic Date Due  . COLON CANCER SCREENING ANNUAL FOBT  12/14/2017  . COLONOSCOPY  01/21/2019 (Originally 08/27/1996)  . INFLUENZA VACCINE  09/21/2018  . MAMMOGRAM  04/28/2019  . TETANUS/TDAP  08/12/2024  . DEXA SCAN  Completed  . Hepatitis C Screening  Completed  . PNA vac Low Risk Adult  Completed    We also discussed quitting smoking.  Call 1800-QUIT-NOW for help with stopping smoking.   Coping with Quitting Smoking  Quitting smoking is a physical and mental challenge. You will face cravings, withdrawal symptoms, and temptation. Before quitting, work with your health care provider to make a plan that can help you cope. Preparation can help you quit and keep you from giving in. How can I cope with cravings? Cravings usually last for 5-10 minutes. If you get through it, the craving will pass. Consider taking the following actions to help you cope with cravings:  Keep your mouth busy: ? Chew sugar-free gum. ? Suck on hard candies or a straw. ? Brush your teeth.  Keep your hands and body busy: ? Immediately change to a different activity when you feel a craving. ? Squeeze or play with a ball. ? Do an activity or a hobby, like making bead jewelry, practicing needlepoint, or working with wood. ? Mix up your normal routine. ? Take a short exercise break. Go for a quick walk or run up and down stairs. ? Spend time in  public places where smoking is not allowed.  Focus on doing something kind or helpful for someone else.  Call a friend or family member to talk during a craving.  Join a support group.  Call a quit line, such as 1-800-QUIT-NOW.  Talk with your health care provider about medicines that might help you cope with cravings and make quitting easier for you. How can I deal with withdrawal symptoms? Your body may experience negative effects as it tries to get used to not having nicotine in the system. These effects are called withdrawal symptoms. They may include:  Feeling hungrier than normal.  Trouble concentrating.  Irritability.  Trouble sleeping.  Feeling depressed.  Restlessness and agitation.  Craving a cigarette. To manage withdrawal symptoms:  Avoid places, people, and activities that trigger your cravings.  Remember why you want to quit.  Get plenty of sleep.  Avoid coffee and other caffeinated drinks. These may worsen some of your symptoms. How can I handle social situations? Social situations can be difficult when you are quitting smoking, especially in the first few weeks. To manage this, you can:  Avoid parties, bars, and other social situations where people might be smoking.  Avoid alcohol.  Leave right away if you have the urge to smoke.  Explain to your family and friends that you are  quitting smoking. Ask for understanding and support.  Plan activities with friends or family where smoking is not an option. What are some ways I can cope with stress? Wanting to smoke may cause stress, and stress can make you want to smoke. Find ways to manage your stress. Relaxation techniques can help. For example:  Breathe slowly and deeply, in through your nose and out through your mouth.  Listen to soothing, relaxing music.  Talk with a family member or friend about your stress.  Light a candle.  Soak in a bath or take a shower.  Think about a peaceful place.  What are some ways I can prevent weight gain? Be aware that many people gain weight after they quit smoking. However, not everyone does. To keep from gaining weight, have a plan in place before you quit and stick to the plan after you quit. Your plan should include:  Having healthy snacks. When you have a craving, it may help to: ? Eat plain popcorn, crunchy carrots, celery, or other cut vegetables. ? Chew sugar-free gum.  Changing how you eat: ? Eat small portion sizes at meals. ? Eat 4-6 small meals throughout the day instead of 1-2 large meals a day. ? Be mindful when you eat. Do not watch television or do other things that might distract you as you eat.  Exercising regularly: ? Make time to exercise each day. If you do not have time for a long workout, do short bouts of exercise for 5-10 minutes several times a day. ? Do some form of strengthening exercise, like weight lifting, and some form of aerobic exercise, like running or swimming.  Drinking plenty of water or other low-calorie or no-calorie drinks. Drink 6-8 glasses of water daily, or as much as instructed by your health care provider. Summary  Quitting smoking is a physical and mental challenge. You will face cravings, withdrawal symptoms, and temptation to smoke again. Preparation can help you as you go through these challenges.  You can cope with cravings by keeping your mouth busy (such as by chewing gum), keeping your body and hands busy, and making calls to family, friends, or a helpline for people who want to quit smoking.  You can cope with withdrawal symptoms by avoiding places where people smoke, avoiding drinks with caffeine, and getting plenty of rest.  Ask your health care provider about the different ways to prevent weight gain, avoid stress, and handle social situations. This information is not intended to replace advice given to you by your health care provider. Make sure you discuss any questions you have with  your health care provider. Document Released: 02/04/2016 Document Revised: 01/19/2017 Document Reviewed: 02/04/2016 Elsevier Patient Education  2020 [name] 65 Years and Older, Female Preventive care refers to lifestyle choices and visits with your health care provider that can promote health and wellness. This includes:  A yearly physical exam. This is also called an annual well check.  Regular dental and eye exams.  Immunizations.  Screening for certain conditions.  Healthy lifestyle choices, such as diet and exercise. What can I expect for my preventive care visit? Physical exam Your health care provider will check:  Height and weight. These may be used to calculate body mass index (BMI), which is a measurement that tells if you are at a healthy weight.  Heart rate and blood pressure.  Your skin for abnormal spots. Counseling Your health care provider may ask you questions about:  Alcohol, tobacco,  and drug use.  Emotional well-being.  Home and relationship well-being.  Sexual activity.  Eating habits.  History of falls.  Memory and ability to understand (cognition).  Work and work Statistician.  Pregnancy and menstrual history. What immunizations do I need?  Influenza (flu) vaccine  This is recommended every year. Tetanus, diphtheria, and pertussis (Tdap) vaccine  You may need a Td booster every 10 years. Varicella (chickenpox) vaccine  You may need this vaccine if you have not already been vaccinated. Zoster (shingles) vaccine  You may need this after age 66. Pneumococcal conjugate (PCV13) vaccine  One dose is recommended after age 37. Pneumococcal polysaccharide (PPSV23) vaccine  One dose is recommended after age 58. Measles, mumps, and rubella (MMR) vaccine  You may need at least one dose of MMR if you were born in 1957 or later. You may also need a second dose. Meningococcal conjugate (MenACWY) vaccine  You may need  this if you have certain conditions. Hepatitis A vaccine  You may need this if you have certain conditions or if you travel or work in places where you may be exposed to hepatitis A. Hepatitis B vaccine  You may need this if you have certain conditions or if you travel or work in places where you may be exposed to hepatitis B. Haemophilus influenzae type b (Hib) vaccine  You may need this if you have certain conditions. You may receive vaccines as individual doses or as more than one vaccine together in one shot (combination vaccines). Talk with your health care provider about the risks and benefits of combination vaccines. What tests do I need? Blood tests  Lipid and cholesterol levels. These may be checked every 5 years, or more frequently depending on your overall health.  Hepatitis C test.  Hepatitis B test. Screening  Lung cancer screening. You may have this screening every year starting at age 21 if you have a 30-pack-year history of smoking and currently smoke or have quit within the past 15 years.  Colorectal cancer screening. All adults should have this screening starting at age 28 and continuing until age 51. Your health care provider may recommend screening at age 31 if you are at increased risk. You will have tests every 1-10 years, depending on your results and the type of screening test.  Diabetes screening. This is done by checking your blood sugar (glucose) after you have not eaten for a while (fasting). You may have this done every 1-3 years.  Mammogram. This may be done every 1-2 years. Talk with your health care provider about how often you should have regular mammograms.  BRCA-related cancer screening. This may be done if you have a family history of breast, ovarian, tubal, or peritoneal cancers. Other tests  Sexually transmitted disease (STD) testing.  Bone density scan. This is done to screen for osteoporosis. You may have this done starting at age 37. Follow  these instructions at home: Eating and drinking  Eat a diet that includes fresh fruits and vegetables, whole grains, lean protein, and low-fat dairy products. Limit your intake of foods with high amounts of sugar, saturated fats, and salt.  Take vitamin and mineral supplements as recommended by your health care provider.  Do not drink alcohol if your health care provider tells you not to drink.  If you drink alcohol: ? Limit how much you have to 0-1 drink a day. ? Be aware of how much alcohol is in your drink. In the [place] one drink equals one 12  oz bottle of beer (355 mL), one 5 oz glass of wine (148 mL), or one 1 oz glass of hard liquor (44 mL). Lifestyle  Take daily care of your teeth and gums.  Stay active. Exercise for at least 30 minutes on 5 or more days each week.  Do not use any products that contain nicotine or tobacco, such as cigarettes, e-cigarettes, and chewing tobacco. If you need help quitting, ask your health care provider.  If you are sexually active, practice safe sex. Use a condom or other form of protection in order to prevent STIs (sexually transmitted infections).  Talk with your health care provider about taking a low-dose aspirin or statin. What's next?  Go to your health care provider once a year for a well check visit.  Ask your health care provider how often you should have your eyes and teeth checked.  Stay up to date on all vaccines. This information is not intended to replace advice given to you by your health care provider. Make sure you discuss any questions you have with your health care provider. Document Released: 03/05/2015 Document Revised: 01/31/2018 Document Reviewed: 01/31/2018 Elsevier Patient Education  [employer].  Here is an example of what a healthy plate looks like:    ? Make half your plate fruits and vegetables.     ? Focus on whole fruits.     ? Vary your veggies.  ? Make half your grains whole grains. -     ?  Look for the word "whole" at the beginning of the ingredients list    ? Some whole-grain ingredients include whole oats, whole-wheat flour,        whole-grain corn, whole-grain brown rice, and whole rye.  ? Move to low-fat and fat-free milk or yogurt.  ? Vary your protein routine. - Meat, fish, poultry (chicken, [place]), eggs, beans (kidney, pinto), dairy.  ? Drink and eat less sodium, saturated fat, and added sugars.    Our clinic's number is [phone]. Please call with questions or concerns about what we discussed today.

## 2018-09-09 NOTE — Progress Notes (Addendum)
Subjective:   Alexis Cole is a 73 y.o. female who presents for Medicare Annual preventive examination.  The patient consented to a virtual visit.  Review of Systems: Defer to PCP   Cardiac Risk Factors include: advanced age (>17men, >81 women);smoking/ tobacco exposure;hypertension     Objective:     Vitals: BP 114/70 Comment: patient reported  Ht 5\' 6"  (1.676 m)   Wt 197 lb (89.4 kg)   BMI 31.80 kg/m   Body mass index is 31.8 kg/m.  Advanced Directives 09/09/2018 07/26/2018 07/23/2018 05/06/2018 02/15/2018 10/25/2017 10/25/2017  Does Patient Have a Medical Advance Directive? No No No No No No No  Would patient like information on creating a medical advance directive? No - Patient declined No - Guardian declined - Yes (MAU/Ambulatory/Procedural Areas - Information given) No - Patient declined No - Patient declined No - Patient declined  Pre-existing out of facility DNR order (yellow form or pink MOST form) - - - - - - -    Tobacco Social History   Tobacco Use  Smoking Status Current Every Day Smoker  . Packs/day: 0.50  . Years: 15.00  . Pack years: 7.50  . Types: Cigarettes  . Start date: 02/21/1968  Smokeless Tobacco Never Used  Tobacco Comment   quit in the past for 9 years.  Previous 1ppd     Ready to quit: yes and no. Patient feels more stress with Covid 19. Counseling given: yes, resources provided.   Clinical Intake:  Pre-visit preparation completed: Yes  Pain Score: 5   How often do you need to have someone help you when you read instructions, pamphlets, or other written materials from your doctor or pharmacy?: 1 - Never What is the last grade level you completed in school?: College  Interpreter Needed?: No  Past Medical History:  Diagnosis Date  . Acne    but not being treated for  . Anemia    history of--many yrs ago  . Anxiety    but doesn't take any meds for this  . Aortic atherosclerosis ([place]) 06/08/2016   noted on CT abd/pelvis  . Arthritis    . Arthropathy, lower leg 02/10/2009   For eye care pt is going to Dr. Katy Fitch, and her Ortho surgeon is Dr. Dorna Leitz and [facility].    . Back pain 11/17/2015  . Chronic back pain   . Complication of anesthesia    States she woke up just after parathyroid surgery while still in OR and attempted to get off the bed, remebers seeing staffs back towards her  . Depression   . Diarrhea   . Diverticulitis 06/08/2016   noted on CT abd/pelvis  . Diverticulosis 06/08/2016   noted on CT abd/pelvis  . Dyslipidemia   . Dyspnea 10/26/2017  . Estrogen deficiency 08/13/2014  . Fatigue 02/24/2016  . Fatty liver 06/08/2016   severe noted on CT abd/pelvis   . Gastric ulcer    history of--many yrs ago  . General weakness 06/08/2016  . Headache(784.0)    History of migraine  . Heart murmur   . History of bronchitis    last time 58months  . History of syncope   . Hx of migraines    last one a month ago  . Hypercalcemia 12/12/2016  . Hyperparathyroidism ([place]) 12/12/2016  . Hypertension     Dr     Higinio Plan  . Insomnia    Nyquil nightly  . Joint pain   . Joint swelling   .  Lactose intolerance   . Nausea and vomiting   . Obesity   . Pneumonia 03/15/2016   left lower lobe, left lower lobe atelectasis, elevated left hemidiaphragm noted again, small left pleural effusion  . Pre-syncope 04/12/2016  . PTSD (post-traumatic stress disorder)   . Sinusitis   . Urge incontinence of urine    Past Surgical History:  Procedure Laterality Date  . CATARACT EXTRACTION, BILATERAL    . CERVICAL CONE BIOPSY    . DILATION AND CURETTAGE OF UTERUS  30+yrs ago  . MOUTH SURGERY    . NASAL SEPTUM SURGERY  40+yrs ago  . PARATHYROIDECTOMY Left 01/04/2017   Procedure: LEFT SUPERIOR PARATHYROIDECTOMY;  Surgeon: Armandina Gemma, MD;  Location: [facility];  Service: General;  Laterality: Left;  . TOTAL HIP ARTHROPLASTY  09/01/2011   Procedure: TOTAL HIP ARTHROPLASTY;  Surgeon: [name], MD;  Location: [facility];  Service:  Orthopedics;  Laterality: Right;  . TOTAL HIP ARTHROPLASTY Left 07/26/2018   Procedure: TOTAL HIP ARTHROPLASTY ANTERIOR APPROACH;  Surgeon: Dorna Leitz, MD;  Location: [facility];  Service: Orthopedics;  Laterality: Left;  . TOTAL KNEE ARTHROPLASTY Left 03/09/2017   Procedure: LEFT TOTAL KNEE ARTHROPLASTY;  Surgeon: Dorna Leitz, MD;  Location: [facility];  Service: Orthopedics;  Laterality: Left;  . TUBAL LIGATION  30+yrs ago   Family History  Problem Relation Age of Onset  . Hypertension Mother   . Arthritis-Osteo Mother   . Irritable bowel syndrome Mother   . Cancer - Prostate Father   . High blood pressure Brother   . Diabetes Mellitus II Maternal Grandmother   . Heart attack Maternal Grandfather   . Hyperlipidemia Neg Hx   . Diabetes Neg Hx   . Sudden death Neg Hx    Social History   Socioeconomic History  . Marital status: Divorced    Spouse name: Not on file  . Number of children: 1  . Years of education: 39  . Highest education level: Bachelor's degree (e.g., BA, AB, BS)  Occupational History  . Occupation: Emergency planning/management officer     Comment: retired   . 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 Financial resource strain: Not hard at all  . Food insecurity    Worry: Never true    Inability: Never true  . Transportation needs    Medical: No    Non-medical: No  Tobacco Use  . Smoking status: Current Every Day Smoker    Packs/day: 0.50    Years: 15.00    Pack years: 7.50    Types: Cigarettes    Start date: 02/21/1968  . Smokeless tobacco: Never Used  . Tobacco comment: quit in the past for 9 years.  Previous 1ppd  Substance and Sexual Activity  . Alcohol use: No    Alcohol/week: 0.0 standard drinks    Frequency: Never  . Drug use: No  . Sexual activity: Not Currently    Birth control/protection: Post-menopausal  Lifestyle  . Physical activity    Days per week: 7 days    Minutes per session: 30 min  . Stress: To some extent  Relationships  . Social  Herbalist on phone: Three times a week    Gets together: Never    Attends religious service: Never    Active member of club or organization: No    Attends meetings of clubs or organizations: Never    Relationship status: Divorced  Other Topics Concern  .  Not on file  Social History Narrative   Patient lives alone here in [place]. The patient does have a dog, Tomasa Hose, who brings her great joy. Patients only child, a son, lives out in [place]. They talk on the phone several times a week. The patient is Buddhist and practices daily meditation. The patient enjoys walking her dog, zumba classes and swimming.         Outpatient Encounter Medications as of 09/09/2018  Medication Sig  . albuterol (PROAIR HFA) 108 (90 Base) MCG/ACT inhaler Inhale 2 puffs into the lungs every 4 (four) hours as needed for wheezing or shortness of breath. INHALE 2-4 PUFFS INTO THE LUNGS EVERY 4 (FOUR) HOURS AS NEEDED FOR WHEEZING (OR COUGH).  Marland Kitchen amLODipine (NORVASC) 10 MG tablet Take 1 tablet (10 mg total) by mouth daily.  Marland Kitchen atorvastatin (LIPITOR) 40 MG tablet Take 1 tablet (40 mg total) by mouth daily.  Marland Kitchen BIOTIN PO Take 1 tablet by mouth at bedtime.  . busPIRone (BUSPAR) 10 MG tablet Take 1 tablet (10 mg total) by mouth 2 (two) times daily.  . Cholecalciferol (VITAMIN D3) 50 MCG (2000 UT) capsule Take 2,000 Units by mouth at bedtime.   . Coenzyme Q10 (CO Q-10) 100 MG CAPS Take 100 mg by mouth at bedtime.   Marland Kitchen escitalopram (LEXAPRO) 5 MG tablet Take 1 tablet (5 mg total) by mouth daily.  . fluticasone (FLONASE) 50 MCG/ACT nasal spray Place 1 spray into both nostrils daily as needed for allergies or rhinitis.  Marland Kitchen FOLIC ACID PO Take 1 tablet by mouth at bedtime.  . gabapentin (NEURONTIN) 300 MG capsule Take 1 capsule (300 mg total) by mouth 3 (three) times daily.  Javier Docker Oil 350 MG CAPS Take 350 mg by mouth at bedtime.   . Liniments (SALONPAS PAIN RELIEF PATCH EX) Place 1 patch onto the skin daily as  needed (pain).  Marland Kitchen lisinopril (PRINIVIL,ZESTRIL) 20 MG tablet Take 1 tablet (20 mg total) by mouth daily.  . Menthol, Topical Analgesic, (ASPERCREME MAX ROLL-ON) 16 % LIQD Apply 1 application topically daily as needed (pain).  . Multiple Vitamins-Minerals (HEALTHY EYES PO) Take 1 tablet by mouth at bedtime.  Marland Kitchen oxyCODONE-acetaminophen (PERCOCET/ROXICET) 5-325 MG tablet Take 1-2 tablets by mouth every 6 (six) hours as needed for severe pain.  Marland Kitchen oxymetazoline (AFRIN) 0.05 % nasal spray Place 1 spray into both nostrils 2 (two) times daily as needed for congestion.  . Pseudoeph-Doxylamine-DM-APAP (NYQUIL PO) Take 30 mLs by mouth at bedtime as needed (allergies/insomnia).  Marland Kitchen tiZANidine (ZANAFLEX) 2 MG tablet Take 1 tablet (2 mg total) by mouth every 8 (eight) hours as needed for muscle spasms.  Marland Kitchen aspirin EC 325 MG tablet Take 1 tablet (325 mg total) by mouth 2 (two) times daily after a meal. Take x 1 month post op to decrease risk of blood clots. (Patient not taking: Reported on 09/09/2018)  . [DISCONTINUED] docusate sodium (COLACE) 100 MG capsule Take 1 capsule (100 mg total) by mouth 2 (two) times daily. (Patient not taking: Reported on 09/09/2018)   No facility-administered encounter medications on file as of 09/09/2018.     Activities of Daily Living In your present state of health, do you have any difficulty performing the following activities: 09/09/2018 07/26/2018  Hearing? N Y  [place]? N N  Comment - -  Difficulty concentrating or making decisions? N N  Walking or climbing stairs? N N  Dressing or bathing? N N  Doing errands, shopping? N  N  Preparing Food and eating ? N -  Using the Toilet? N -  In the past six months, have you accidently leaked urine? Y -  Do you have problems with loss of bowel control? N -  Managing your Medications? N -  Managing your Finances? N -  Housekeeping or managing your Housekeeping? N -  Some recent data might be hidden    Patient Care Team:  Patriciaann Clan, DO as PCP - General    Assessment:   This is a routine wellness examination for Brennan.  Exercise Activities and Dietary recommendations Current Exercise Habits: Home exercise routine, Type of exercise: walking;yoga;Other - see comments(zumba classes and swimming), Time (Minutes): 30, Frequency (Times/Week): 7, Weekly Exercise (Minutes/Week): 210, Intensity: Moderate, Exercise limited by: orthopedic condition(s)  Goals    . Blood Pressure < 150/90    . Quit smoking / using tobacco     Patient has been smoking 1/2PPD due to covid.    . Weight (lb) < 180 lb (81.6 kg)     Patient has lost 8 pounds and wants to lose another 8 more.        Fall Risk Fall Risk  09/09/2018 05/03/2018 02/15/2018 03/01/2017 10/25/2016  Falls in the past year? 0 0 0 No No  Number falls in past yr: - 0 - - -  Injury with Fall? - 0 - - -  Follow up - Falls evaluation completed - - -   Is the patient's home free of loose throw rugs in walkways, pet beds, electrical cords, etc?   yes      Grab bars in the bathroom? yes      Handrails on the stairs?   yes      Adequate lighting?   yes  Patient rating of health (0-10) scale: 6  Depression Screen PHQ 2/9 Scores 09/09/2018 05/03/2018 02/15/2018 10/25/2017  PHQ - 2 Score 0 0 0 0  PHQ- 9 Score - - - -     Cognitive Function   6CIT Screen 09/09/2018  What Year? 0 points  What month? 0 points  What time? 0 points  Count back from 20 0 points  Months in reverse 0 points  Repeat phrase 0 points  Total Score 0    Immunization History  Administered Date(s) Administered  . Influenza,inj,Quad PF,6+ Mos 01/03/2013, 04/17/2014, 11/17/2015, 10/25/2016, 10/25/2017  . Pneumococcal Conjugate-13 04/17/2014  . Pneumococcal Polysaccharide-23 09/04/2011  . Tdap 08/13/2014    Screening Tests Health Maintenance  Topic Date Due  . COLON CANCER SCREENING ANNUAL FOBT  12/14/2017  . COLONOSCOPY  01/21/2019 (Originally 08/27/1996)  . INFLUENZA VACCINE   09/21/2018  . MAMMOGRAM  04/28/2019  . TETANUS/TDAP  08/12/2024  . DEXA SCAN  Completed  . Hepatitis C Screening  Completed  . PNA vac Low Risk Adult  Completed    Cancer Screenings: Lung: Low Dose CT Chest recommended if Age 78-80 years, 30 pack-year currently smoking OR have quit w/in 15years. Patient does qualify. Breast:  Up to date on Mammogram? Yes   Up to date of Bone Density/Dexa? Yes Colorectal: Due 01/2019- does have a current stool card.   Additional Screenings: Hepatitis C Screening: Completed    Plan:  Continue to take your daily walks. Work on quitting smoking.  Bring completed Advanced Directive to your next office visit.  Bring stool card in for testing.   I have personally reviewed and noted the following in the patient's chart:   . Medical and social  history . Use of alcohol, tobacco or illicit drugs  . Current medications and supplements . Functional ability and status . Nutritional status . Physical activity . Advanced directives . List of other physicians . Hospitalizations, surgeries, and ER visits in previous 12 months . Vitals . Screenings to include cognitive, depression, and falls . Referrals and appointments  In addition, I have reviewed and discussed with patient certain preventive protocols, quality metrics, and best practice recommendations. A written personalized care plan for preventive services as well as general preventive health recommendations were provided to patient.  This visit was conducted virtually in the setting of the [place] pandemic.    Dorna Bloom, [place]  09/09/2018    I have reviewed this visit and agree with the documentation.  Patriciaann Clan, DO

## 2018-09-17 ENCOUNTER — Other Ambulatory Visit: Payer: Self-pay | Admitting: Family Medicine

## 2018-09-17 DIAGNOSIS — G4700 Insomnia, unspecified: Secondary | ICD-10-CM

## 2018-09-28 IMAGING — DX DG CHEST 2V
2 series · 2 of 2 positions shown · non-contrast
Comparison: None in PACs

CLINICAL DATA: Chest congestion, history of bronchitis, current
smoker.

EXAM:
CHEST  2 VIEW

[dg chest 2 view (1 of 2)]
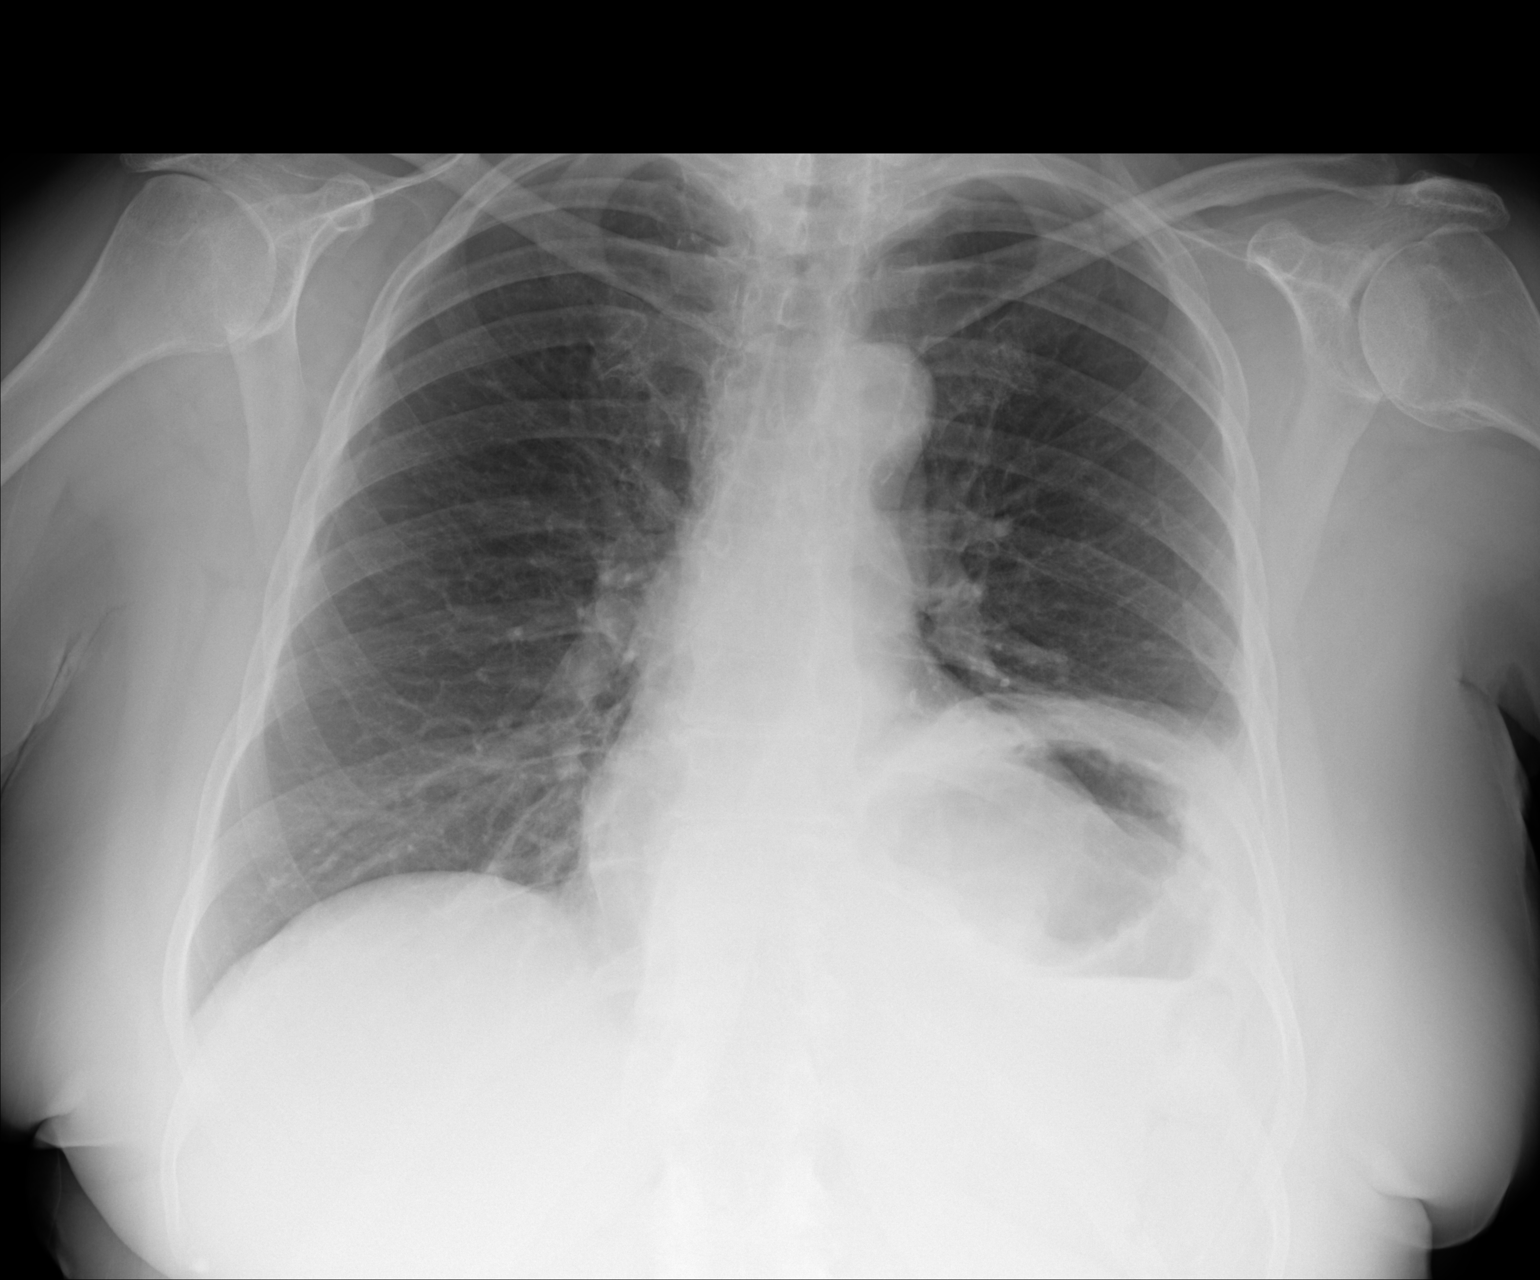

[dg chest 2 view (2 of 2)]
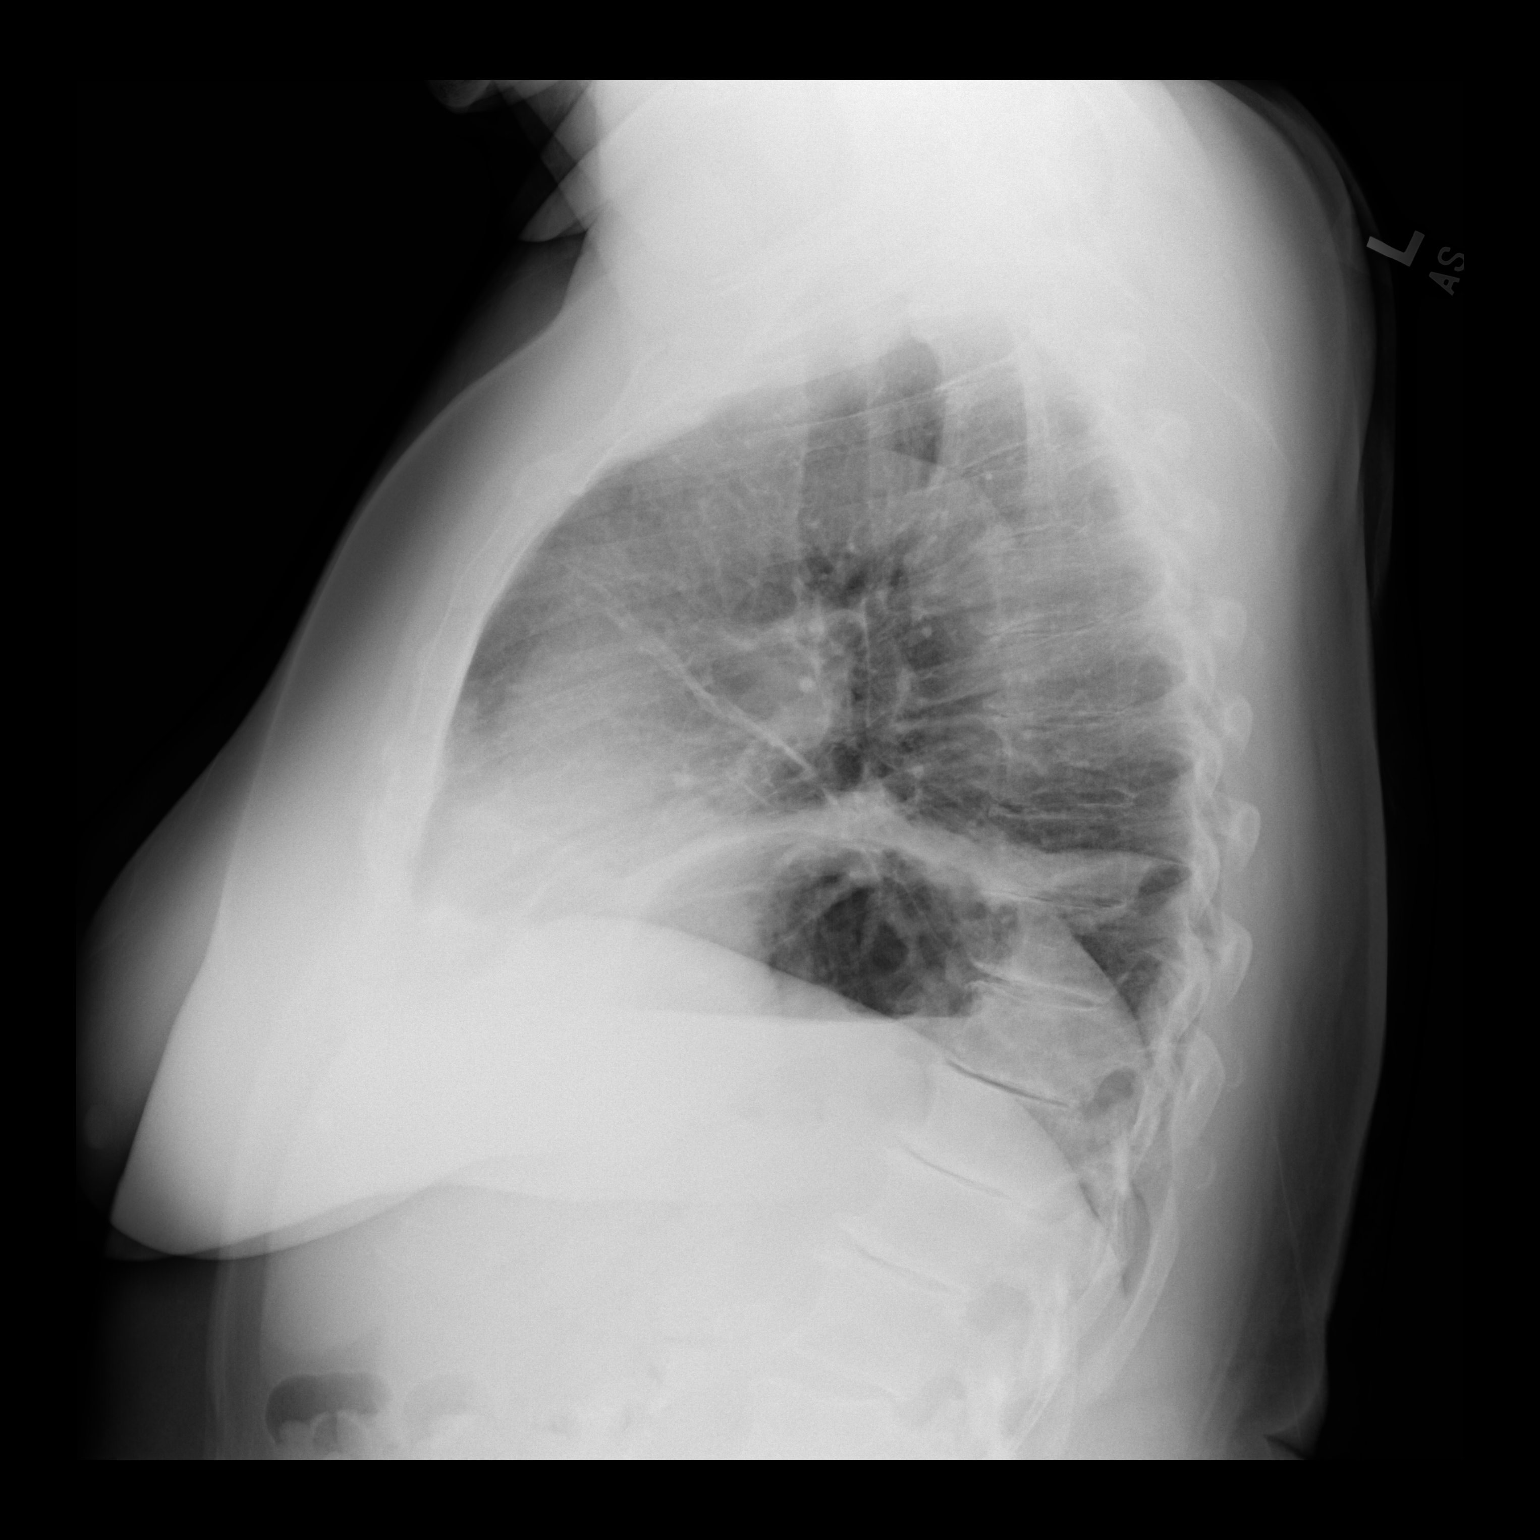

[2 of 2 positions shown; findings below may reference images not displayed]

FINDINGS: There is mild elevation of the left hemidiaphragm. Adjacent to the
posterior aspect of the hemidiaphragm is increased pulmonary
parenchymal density. There is no pleural effusion or pneumothorax.
The right lung is clear. The heart and pulmonary vascularity are
normal. There is calcification in the wall of the aortic arch. There
is multilevel degenerative disc disease of the thoracic spine.
IMPRESSION: Atelectasis or pneumonia inferiorly in the left lower lobe adjacent
to the elevated left hemidiaphragm. Followup PA and lateral chest
X-ray is recommended in 3-4 weeks following trial of antibiotic
therapy to ensure resolution and exclude underlying malignancy.

Thoracic aortic atherosclerosis.

## 2018-11-04 NOTE — Progress Notes (Signed)
Subjective:    Patient ID: Alexis Cole, female    DOB: 1945-04-06, 73 y.o.   MRN: [id number]   CC: F/u and flu shot   HPI: Alexis Cole is a 73 year old female with a history of osteoarthritis, hypertension, and recurrent sinusitis presents to discuss the following:  Hypertension: Endorses compliance to her Norvasc, lisinopril without any concerns.  Denies any lightheadedness/dizziness, headaches.  Tobacco use: Previous is working on quitting smoking, however due to increased stress over the last several months is now at 1.5 packs daily.  Due to being home so frequently states she is often getting bored and will smoke during this time. She is not interested in decreasing this currently.  Insomnia: For the past 2 months she has been having difficulty staying asleep.  Has been waking up around 2-4 AM and not being able to fall back asleep.  She is tried melatonin in the past, was not helpful.  Recently had a hip replacement back in 07/2018, still struggles with pain and restriction within this leg, however does not believe that the pain is been waking her up at night.  Of note, feels like her depression has increased in the past several months due to the pandemic and the state of our country currently.  Additionally, her pain secondary to her hip increasingly has made her more depressed as she is not as mobile she previously was.  Lastly, recently found out that her dog was diagnosed with lymphoma.  She has been taking Lexapro 5 mg and BuSpar 10 mg twice daily for anxiety/depression.  Has good friends support, not interested in therapy at this time but will consider it.  Osteoarthritis: Had total hip arthroplasty on the left in 07/2018.  Completed physical therapy, trying to do exercises on a daily basis at home.  Still struggles with spasms and pain in this area.  Currently receiving Percocet and muscle relaxers through her orthopedic surgeon.  These have been beneficial in terms of pain.  PHQ 9:  11, somewhat difficult.  Answered 0 to thoughts that she would be better off dead or hurting herself.  Health maintenance: Due for flu shot, wants this today  Smoking status reviewed  Review of Systems Per HPI    Objective:  BP 110/64   Pulse 83   SpO2 97%  Vitals and nursing note reviewed  General: NAD, pleasant Cardiac: RRR  Respiratory: Unlabored breathing Extremities: no edema or cyanosis. 5/5 lower extremity strength, however does note pain on the left.  Skin: warm and dry, no rashes noted Neuro: alert and oriented, no focal deficits Psych: Depressed mood and affect  Assessment & Plan:   ANXIETY DEPRESSION Uncontrolled, recent increase in the past several months in the setting of COVID pandemic, hip pain, and lymphoma diagnosis in her dog. Suspect this is contributing to her recent insomnia, will increase her Lexapro to 10 mg daily.  Additionally provided her with therapy resources in the community if she wishes to establish in the future. - Lexapro 10 mg, from 5 -Continue BuSpar 10 mg twice daily - Discussed sleep hygiene - Therapy resources provided, including crisis hotline - Follow-up in 1 month or sooner if needed to monitor mood, insomnia  Essential hypertension, benign Well-controlled.  Recent electrolytes and Cr wnl in 07/2018.  -Continue Norvasc 10 mg and lisinopril 20 mg  Tobacco use disorder Currently does not wish to cut back. Discussed alternative stress reducing techniques. Hopeful with better control of her depression, she may be willing to come  back in the near future.  Healthcare maintenance -Received flu shot today - Provided stool sample today for FOBT yearly screening  Primary osteoarthritis of left hip S/p total hip arthroplasty in 07/2018.  Recommend continued follow-up with orthopedic surgeon and daily exercises.   Follow-up in approximate 1 month for depression/insomnia or sooner if needed.  [facility] Resident  PGY-2

## 2018-11-05 ENCOUNTER — Encounter: Payer: Self-pay | Admitting: Family Medicine

## 2018-11-05 ENCOUNTER — Ambulatory Visit (INDEPENDENT_AMBULATORY_CARE_PROVIDER_SITE_OTHER): Payer: Medicare Other | Admitting: Family Medicine

## 2018-11-05 ENCOUNTER — Other Ambulatory Visit: Payer: Self-pay

## 2018-11-05 VITALS — BP 110/64 | HR 83

## 2018-11-05 DIAGNOSIS — F17200 Nicotine dependence, unspecified, uncomplicated: Secondary | ICD-10-CM

## 2018-11-05 DIAGNOSIS — Z23 Encounter for immunization: Secondary | ICD-10-CM

## 2018-11-05 DIAGNOSIS — F341 Dysthymic disorder: Secondary | ICD-10-CM

## 2018-11-05 DIAGNOSIS — I10 Essential (primary) hypertension: Secondary | ICD-10-CM | POA: Diagnosis not present

## 2018-11-05 DIAGNOSIS — Z1211 Encounter for screening for malignant neoplasm of colon: Secondary | ICD-10-CM | POA: Diagnosis not present

## 2018-11-05 DIAGNOSIS — M1612 Unilateral primary osteoarthritis, left hip: Secondary | ICD-10-CM

## 2018-11-05 DIAGNOSIS — Z0000 Encounter for general adult medical examination without abnormal findings: Secondary | ICD-10-CM

## 2018-11-05 NOTE — Patient Instructions (Signed)
It was great seeing you today.  We are going to increase your Lexapro to 10 mg daily from 5.  You can continue your BuSpar.  I would like to see you back in approximately 1 month to follow-up on this.  Hopefully as it gets nicer outside you can continue to do more things outside to help boost your mood and keep you busy.  Good sleep habits (sometimes referred to as "sleep hygiene") can help you get a good night's sleep. Some habits that can improve your sleep health:  Be consistent. Go to bed at the same time each night and get up at the same time each morning, including on the weekends Make sure your bedroom is quiet, dark, relaxing, and at a comfortable temperature Remove electronic devices, such as TVs, computers, and smart phones, from the bedroom Avoid large meals, caffeine, and alcohol before bedtime Get some exercise. Being physically active during the day can help you fall asleep more easily at night.  Therapy and Counseling Resources Most providers on this list will take Medicaid. Patients with commercial insurance or Medicare should contact their insurance company to get a list of in network providers.  [facility] [address] [place], [place] [address]       [phone]     [facility] [address], [place], [place]    [facility]inny Bloss[facility] [address], [place], [place]  [facility]:  [place]. [place]  [facility]:  [place] [address] Loni [place], [place]  [facility] (under & uninsured) [address], [place] [phone]    [email or web address]    [facility] [place]     Phone:  [phone]     [place]  [place] [address]      [place], [place] [address] [place]: [place], [place], [place]   [place] ([place] therapist) [address] [place] [address]   [place] [address]    [phone]    [facility]   [place],  [place], [place]   [place]  [place]  [facility]  [address] [place], [place]        [phone]  Open Access/Walk In Clinic under & uninsured [place],  [address] [place] [phone]):  Mon - Fri from 8 AM - 3 PM  [facility] the [place],  ([place])   [place], [place]: [phone]) 8:30 - 12; 1 - 2:30  [facility] of the [place],  [place], [place]    ([phone]):8:30 - 12; 2 - 3PM  [facility] [employer],  [address],  [place]; [phone]):   Mon - Fri 8 AM - 5 PM  [facility] [place]  MWF 12:30 to 3:00 or call to schedule an appointment  [phone]  Specific Provider options Psychology Today  [email or web address]/us 1. click on find a therapist  2. enter your zip code 3. left side and select or tailor a therapist for your specific need.   [facility] Provider Directory [email or web address]  (Medicaid)  Follow all drop down to find a provider  [place] or [email or web address] [address], [place], [place] Recovery support and educational   In home counseling [place] Telephone: [phone]  office in [place] [email or web address]   Does not take reg. Medicaid or Medicare private insurance [facility], [place] health Choice, [facility], [place], [place], [place], [place] Health Choice  24- Hour Availability:  . [place] or [phone]  . Family Service of the [employer] [phone]  [facility] Crisis Service  [phone]   . [place]  [phone] (after hours)  . Therapeutic Alternative/Mobile Crisis   [phone]  . [place] National Suicide Hotline  [phone] ([place])  . Call 911 or go to emergency room  . [employer]  [phone]);  [place] and [employer]   . [facility] ACCESS  [phone]); [place], [place], [place], [place], [place], [place], [place]

## 2018-11-06 ENCOUNTER — Encounter: Payer: Self-pay | Admitting: Family Medicine

## 2018-11-06 LAB — FECAL OCCULT BLOOD, IMMUNOCHEMICAL: Fecal Occult Bld: NEGATIVE

## 2018-11-06 NOTE — Assessment & Plan Note (Signed)
Currently does not wish to cut back. Discussed alternative stress reducing techniques. Hopeful with better control of her depression, she may be willing to come back in the near future.

## 2018-11-06 NOTE — Assessment & Plan Note (Signed)
-  Received flu shot today - Provided stool sample today for FOBT yearly screening

## 2018-11-06 NOTE — Assessment & Plan Note (Signed)
Well-controlled.  Recent electrolytes and Cr wnl in 07/2018.  -Continue Norvasc 10 mg and lisinopril 20 mg

## 2018-11-06 NOTE — Assessment & Plan Note (Signed)
Uncontrolled, recent increase in the past several months in the setting of COVID pandemic, hip pain, and lymphoma diagnosis in her dog. Suspect this is contributing to her recent insomnia, will increase her Lexapro to 10 mg daily.  Additionally provided her with therapy resources in the community if she wishes to establish in the future. - Lexapro 10 mg, from 5 -Continue BuSpar 10 mg twice daily - Discussed sleep hygiene - Therapy resources provided, including crisis hotline - Follow-up in 1 month or sooner if needed to monitor mood, insomnia

## 2018-11-06 NOTE — Assessment & Plan Note (Signed)
S/p total hip arthroplasty in 07/2018.  Recommend continued follow-up with orthopedic surgeon and daily exercises.

## 2018-11-07 ENCOUNTER — Other Ambulatory Visit: Payer: Self-pay | Admitting: *Deleted

## 2018-11-07 DIAGNOSIS — I10 Essential (primary) hypertension: Secondary | ICD-10-CM

## 2018-11-07 MED ORDER — LISINOPRIL 20 MG PO TABS: 20.0000 mg | ORAL_TABLET | Freq: Every day | ORAL | 1 refills | Status: DC

## 2018-12-02 ENCOUNTER — Other Ambulatory Visit: Payer: Self-pay | Admitting: Family Medicine

## 2018-12-02 DIAGNOSIS — F341 Dysthymic disorder: Secondary | ICD-10-CM

## 2019-02-18 ENCOUNTER — Encounter: Payer: Self-pay | Admitting: Family Medicine

## 2019-02-18 ENCOUNTER — Telehealth (INDEPENDENT_AMBULATORY_CARE_PROVIDER_SITE_OTHER): Payer: Medicare Other | Admitting: Family Medicine

## 2019-02-18 ENCOUNTER — Encounter: Payer: Medicare Other | Admitting: Family Medicine

## 2019-02-18 ENCOUNTER — Other Ambulatory Visit: Payer: Self-pay

## 2019-02-18 DIAGNOSIS — F341 Dysthymic disorder: Secondary | ICD-10-CM

## 2019-02-18 DIAGNOSIS — F17200 Nicotine dependence, unspecified, uncomplicated: Secondary | ICD-10-CM | POA: Diagnosis not present

## 2019-02-18 MED ORDER — ESCITALOPRAM OXALATE 10 MG PO TABS: 10.0000 mg | ORAL_TABLET | Freq: Every day | ORAL | 1 refills | Status: DC

## 2019-02-18 NOTE — Assessment & Plan Note (Signed)
Not interested in cutting back at this time, especially with acute stress as discussed above.  Will address this on follow-up visits.

## 2019-02-18 NOTE — Progress Notes (Deleted)
Subjective:  CC -- Annual Physical; With complaints of ***  Pt reports she ***    General Healthcare: Medication Compliance: {YES/NO/WILD RC:4691767  Cardiac Risk as of ***(date): *** (assessment every 3-5 years)*** Aspirin: {YES/NO/WILD CARDS:18581} (31yr CAD risk >3%)*** Dx Hypertension: {YES/NO/WILD RC:4691767  Dx Hyperlipidemia: {YES/NO/WILD RC:4691767  Diabetes: {YES/NO/WILD CARDS:18581} (screen until 72yo w/ BMI >25 or HTN/HLD)***(A1c >6.5, or classic Sxs w/ random CBG >200, or FBG >126 (fasting >8hr))*** Dx Obesity: {YES/NO/WILD CARDS:18581} (Class I BMI <34.9, Class II <39.9, Class III < 49.9)*** Weight Loss: {YES/NO/WILD CARDS:18581} (loss >10% needs further assessment of undernutrition/medical-cause/pharm-cause/food security)*** Physical Activity: {YES/NO/WILD RC:4691767  Urinary Incontinence: {YES/NO/WILD RC:4691767 (yes=med review, GU exam, blood/urine labs)***  Social: Driving: {YES/NO/WILD RC:4691767 (vision, mobility, cognition >> refer for driving assessment PRN)*** Alcohol Use: {YES/NO/WILD RC:4691767  Tobacco Use: {YES/NO/WILD RC:4691767   - Interested in Quitting: {YES/NO/WILD RC:4691767  Other Drugs: {YES/NO/WILD CARDS:18581}  Independence: *** Depression: {YES/NO/WILD CARDS:18581}   - PHQ9 score:  Support and Life at Home: {YES/NO/WILD CARDS:18581} (signs of neglect?)*** Advanced Directives: {YES/NO/WILD RC:4691767   Cancer: (Factor life expectancy & benefits vs harms)*** Colorectal >> Colonoscopy: {YES/NO/WILD RC:4691767  Lung >> Tobacco Use: {YES/NO/WILD CARDS:18581} (screen until 72yo w/ >59yr/pack/yr Hx, unless quit >63yr ago) ***  - If so, previous Low-Dose CT screen: {YES/NO/WILD RC:4691767  Breast >> Mammogram: {YES/NO/WILD CARDS:18581} (to be discussed; Q54yrs in life-expect >38yr)*** Cervical/Endometrial >>  - Postmenopausal: {YES/NO/WILD RC:4691767 - Hysterectomy: {YES/NO/WILD RC:4691767  - Vaginal Bleeding:  {YES/NO/WILD RC:4691767 - Pap Smear: {YES/NO/WILD RC:4691767   - Previous Abnormal Pap: {YES/NO/WILD CARDS:18581} (DC when >65yo w/ 3 consecutive normals)*** Skin >> Suspicious lesions: {YES/NO/WILD RC:4691767   Other: Osteoporosis: {YES/NO/WILD CARDS:18581} (All women >65yo -- do BMD screen)*** Zoster Vaccine: {YES/NO/WILD CARDS:18581} (those >50yo, once)*** Flu Vaccine: {YES/NO/WILD RC:4691767  Pneumonia Vaccine: {YES/NO/WILD RC:4691767 (PPSV13 then PPSV23 in 6-12mths; repeat PPSV23 once if pt received prior to 72yo and 73yrs have passed)***   ROS-  Past Medical History Patient Active Problem List   Diagnosis Date Noted  . Primary osteoarthritis of left hip 07/26/2018  . Primary osteoarthritis of left knee 03/09/2017  . Increased endometrial stripe thickness 06/13/2016  . Hyperparathyroidism, primary ([place]) 04/27/2016  . Hyperlipidemia 01/04/2015  . Healthcare maintenance 06/24/2014  . Obesity 05/07/2014  . Tobacco use disorder 04/24/2014  . Chronic meniscal tear of knee 04/17/2014  . Seasonal allergies 01/03/2013  . Osteoarthritis of right hip 09/01/2011  . Essential hypertension, benign 03/14/2011  . ANXIETY DEPRESSION 02/10/2009  . LOW BACK PAIN, CHRONIC 01/08/2008    Medications- reviewed and updated Current Outpatient Medications  Medication Sig Dispense Refill  . albuterol (PROAIR HFA) 108 (90 Base) MCG/ACT inhaler Inhale 2 puffs into the lungs every 4 (four) hours as needed for wheezing or shortness of breath. INHALE 2-4 PUFFS INTO THE LUNGS EVERY 4 (FOUR) HOURS AS NEEDED FOR WHEEZING (OR COUGH). 17 g 1  . amLODipine (NORVASC) 10 MG tablet Take 1 tablet (10 mg total) by mouth daily. 90 tablet 3  . aspirin EC 325 MG tablet Take 1 tablet (325 mg total) by mouth 2 (two) times daily after a meal. Take x 1 month post op to decrease risk of blood clots. (Patient not taking: Reported on 09/09/2018) 60 tablet 0  . atorvastatin (LIPITOR) 40 MG tablet Take 1 tablet (40 mg  total) by mouth daily. 90 tablet 3  . BIOTIN PO Take 1 tablet by mouth at bedtime.    . busPIRone (BUSPAR) 10 MG tablet Take 1 tablet (10 mg total)  by mouth 2 (two) times daily. 180 tablet 3  . Cholecalciferol (VITAMIN D3) 50 MCG (2000 UT) capsule Take 2,000 Units by mouth at bedtime.     . Coenzyme Q10 (CO Q-10) 100 MG CAPS Take 100 mg by mouth at bedtime.     Marland Kitchen escitalopram (LEXAPRO) 10 MG tablet Take 1 tablet (10 mg total) by mouth daily. 30 tablet 1  . fluticasone (FLONASE) 50 MCG/ACT nasal spray Place 1 spray into both nostrils daily as needed for allergies or rhinitis.    Marland Kitchen FOLIC ACID PO Take 1 tablet by mouth at bedtime.    . gabapentin (NEURONTIN) 300 MG capsule TAKE 1 CAPSULE BY MOUTH THREE TIMES A DAY 270 capsule 1  . Krill Oil 350 MG CAPS Take 350 mg by mouth at bedtime.     . Liniments (SALONPAS PAIN RELIEF PATCH EX) Place 1 patch onto the skin daily as needed (pain).    Marland Kitchen lisinopril (ZESTRIL) 20 MG tablet Take 1 tablet (20 mg total) by mouth daily. 90 tablet 1  . Menthol, Topical Analgesic, (ASPERCREME MAX ROLL-ON) 16 % LIQD Apply 1 application topically daily as needed (pain).    . Multiple Vitamins-Minerals (HEALTHY EYES PO) Take 1 tablet by mouth at bedtime.    Marland Kitchen oxyCODONE-acetaminophen (PERCOCET/ROXICET) 5-325 MG tablet Take 1-2 tablets by mouth every 6 (six) hours as needed for severe pain. 40 tablet 0  . oxymetazoline (AFRIN) 0.05 % nasal spray Place 1 spray into both nostrils 2 (two) times daily as needed for congestion.    . Pseudoeph-Doxylamine-DM-APAP (NYQUIL PO) Take 30 mLs by mouth at bedtime as needed (allergies/insomnia).     No current facility-administered medications for this visit.    Objective: There were no vitals taken for this visit. Gen: NAD, alert, cooperative with exam*** HEENT: NCAT, EOMI, PERRL CV: RRR, good S1/S2, no murmur Resp: CTABL, no wheezes, non-labored Abd: Soft, Non Tender, Non Distended, BS present, no guarding or organomegaly Genital  Exam: {genitourinary exam:311642::"not done"} Ext: No edema, warm Neuro: Alert and oriented, No gross deficits   Assessment/Plan:  No problem-specific Assessment & Plan notes found for this encounter.   No orders of the defined types were placed in this encounter.   No orders of the defined types were placed in this encounter.    Alexis More, DO, PGY-3 02/18/2019 1:04 PM

## 2019-02-18 NOTE — Assessment & Plan Note (Addendum)
Acute superimposed appropriate grief reaction as her dog, who was very dear to her, recently passed away.  No associated SI/HI. Had done well with Lexapro 10 mg and BuSpar, however has been out of her Lexapro and would like this refilled, will send today.  She is not interested in any therapy at this time, however has resources provided at last visit if she wishes to establish.  Encouraged continued meditation and hobbies to keep her busy.  Will touch base with her in the next 1-2 weeks to see how she is doing.

## 2019-02-18 NOTE — Progress Notes (Signed)
[facility] Visit  Patient consented to have virtual visit. Method of visit: Video was attempted but unable to continue to do technology error.   Encounter participants: Patient: Alexis Cole - located at Home Provider: Patriciaann Clan - located at Home  Others (if applicable): None   Chief Complaint: discuss meds   HPI: [name] is a 73 yo female with hypertension, hyperparathyroidism, osteoarthritis, tobacco use, and depression presenting to discuss the following:   Depression/anxiety: Last seen by myself in the clinic in 10/2018 for worsening depression in the setting of the Covid pandemic, continued hip pain after replacement, and lymphoma diagnosis of her dog.  We increased her Lexapro to 10 mg at that time and continued her on her BuSpar 10 mg twice daily.  Recommended follow-up in 1 month, however she is not able to make it.   Today, she is tearful as her dog, Museum/gallery conservator, recently passed away due to her lymphoma.  Her dog was a true companion to her, feels like she does not know who she is without her.  She knows it will get better with time, just currently very sad.  When she is taking the Lexapro 10 mg she did feel this helped with her mood and allowed her to make the most of her time left with Amber.  She recently went to go refill it, however the pharmacy said the medication information needed to be changed, therefore she has been without it.  She would like a refill.  She is not interested in therapy, however has resources from our last visit.  She denies any thoughts of hurting herself or others, SI or HI.  Has friends that she is able to confide in.  Does meditation on a daily basis.  Already scheduled for an annual physical in the clinic tomorrow to get insurance incentive.  She up-to-date on her health maintenance.  Schedule for knee placement in February which she is excited for.   ROS: per HPI  Pertinent PMHx: hypertension,  hyperparathyroidism, osteoarthritis, tobacco use, and depression. Recent hip replacement in 07/2018.   Exam:  Respiratory: Unlabored breathing, speaking in full sentences Psych: Depressed mood and affect, thought process logical, engaging in conversation  Assessment/Plan:  ANXIETY DEPRESSION Acute superimposed appropriate grief reaction as her dog, who was very dear to her, recently passed away.  No associated SI/HI. Had done well with Lexapro 10 mg and BuSpar, however has been out of her Lexapro and would like this refilled, will send today.  She is not interested in any therapy at this time, however has resources provided at last visit if she wishes to establish.  Encouraged continued meditation and hobbies to keep her busy.  Will touch base with her in the next 1-2 weeks to see how she is doing.  Tobacco use disorder Not interested in cutting back at this time, especially with acute stress as discussed above.  Will address this on follow-up visits.    Follow-up on 12/30 for annual physical, fortunately already up-to-date on maintenance.  Could potentially use an updated lipid panel to ensure adherence and appropriate therapy with atorvastatin 40 mg for hyperlipidemia/primary prevention.  Time spent during visit with patient: 19 minutes  Patriciaann Clan, DO

## 2019-03-12 ENCOUNTER — Other Ambulatory Visit: Payer: Self-pay | Admitting: Family Medicine

## 2019-03-12 DIAGNOSIS — F341 Dysthymic disorder: Secondary | ICD-10-CM

## 2019-03-14 ENCOUNTER — Other Ambulatory Visit: Payer: Medicare Other

## 2019-03-18 ENCOUNTER — Ambulatory Visit (INDEPENDENT_AMBULATORY_CARE_PROVIDER_SITE_OTHER): Payer: Medicare Other | Admitting: Family Medicine

## 2019-03-18 ENCOUNTER — Encounter: Payer: Self-pay | Admitting: Family Medicine

## 2019-03-18 ENCOUNTER — Other Ambulatory Visit: Payer: Self-pay

## 2019-03-18 VITALS — BP 130/72 | HR 90 | Wt 216.4 lb

## 2019-03-18 DIAGNOSIS — I10 Essential (primary) hypertension: Secondary | ICD-10-CM | POA: Diagnosis not present

## 2019-03-18 DIAGNOSIS — M1712 Unilateral primary osteoarthritis, left knee: Secondary | ICD-10-CM | POA: Diagnosis not present

## 2019-03-18 DIAGNOSIS — M1711 Unilateral primary osteoarthritis, right knee: Secondary | ICD-10-CM

## 2019-03-18 DIAGNOSIS — Z0000 Encounter for general adult medical examination without abnormal findings: Secondary | ICD-10-CM | POA: Diagnosis not present

## 2019-03-18 DIAGNOSIS — G4700 Insomnia, unspecified: Secondary | ICD-10-CM

## 2019-03-18 DIAGNOSIS — F17200 Nicotine dependence, unspecified, uncomplicated: Secondary | ICD-10-CM

## 2019-03-18 DIAGNOSIS — R9389 Abnormal findings on diagnostic imaging of other specified body structures: Secondary | ICD-10-CM

## 2019-03-18 LAB — POCT GLYCOSYLATED HEMOGLOBIN (HGB A1C): Hemoglobin A1C: 6.4 % — AB (ref 4.0–5.6)

## 2019-03-18 NOTE — Patient Instructions (Signed)
It is so wonderful to see you today as always.  I am so glad that you have done so well with your diet.  I also think a wonderful idea for you to meet with a psychologist through your insurance.  Pending on the requirements from your new apartment building, I will be more than happy to provide a letter for a support animal if you need in the future.

## 2019-03-18 NOTE — Progress Notes (Signed)
Subjective:  CC -- Annual Physical  Pt reports she is doing well currently.  She is starting meeting with a psychologist virtually which has been a great deal of help for her to do with the loss of her dog.  She also now has a Education officer, environmental through her insurance that she will start meeting with as well.  However, she reports she is very frustrated due to her chronic knee/back pain.  She follows with orthopedics for this, however she was recently informed that they would not refill her NSAID or Flexeril unless she saw them monthly.  She is awaiting to undergo right knee replacement, currently dealing with financial constraints as she is moving out of her current apartment.  She is trying to lose some weight to help with her osteoarthritis, started her diet this past week and is very excited.  Cardiovascular: - Dx Hypertension: yes  - Dx Hyperlipidemia: yes  - Dx Obesity: yes, Class 1   - Physical Activity: yes, tries to walk but is severely limited due to her severe osteoarthritis - Diabetes: no, does have a history of prediabetes and has not been checked since 2018  Cancer: Colorectal >> does annual FOBT, last normal in 10/2018 Lung >> Tobacco Use: yes, would qualify for low-dose CT screening Breast >> Mammogram: {UTD Cervical/Endometrial >>  - Postmenopausal: yes  - Vaginal Bleeding: no - Pap Smear: Through age-appropriate screening Skin >> Suspicious lesions: no   Social: Alcohol Use: no  Tobacco Use: yes   - Interested in Quitting: no  Other Drugs: no  Risky Sexual Behavior: no  Depression: Mild, in the setting of recently losing her dog  - PHQ9 score: 4, answered 0 to #10 Support and Life at Home: yes   Other: Zoster Vaccine: UTD Flu Vaccine: yes UTD   Pneumonia Vaccine: UTD  Past Medical History and Medications- reviewed and updated   Objective: BP 130/72   Pulse 90   Wt 216 lb 6.4 oz (98.2 kg)   SpO2 99%   BMI 34.93 kg/m  Gen: NAD, alert, cooperative with  exam HEENT: NCAT, EOMI CV: RRR, good S1/S2, no murmur Resp: CTABL, no wheezes, non-labored Abd: Soft, Non Tender, Non Distended, BS present, no guarding or organomegaly Genital Exam: not done not indicated Ext: No edema, warm Neuro: Alert and oriented, No gross deficits  Assessment/Plan:  Annual physical exam Reviewed health maintenance, medical history, and current medications.  UTD health maintenance with the exception of a low-dose CT screening due to tobacco use, will call and discuss this with patient as unable to address this during her appointment. Congratulated and encouraged her to continue working towards a well-balanced diet and activity as possible (limited by her severe osteoarthritis). -Screening A1c and lipid panel -Will place orders for low-dose CT screening if patient amenable to this  Increased endometrial stripe thickness Noted in 2018 on CT abdomen.  Postmenopausal without any vaginal bleeding.  Plan was to obtain a pelvic ultrasound when she could afford this, however it was never done.  Will touch base with patient on obtaining this.  Osteoarthritis of right knee Severe, limiting physical function.  Discussed that I am willing to prescribe her Flexeril (understandably on beers list-has been using this once daily and stable) and Etorolac in the interim until she is able to get her right knee replaced especially as these allow her to be functional. However will need to check renal function prior to.  Encouraged her to use these as sparingly as possible and  only when pain not controlled with her baseline agents to avoid any worsening renal function or GI bleeding etc. -BMP -Sent in Flexeril and NSAID after Cr WNL, will be temporary prescriptions -Continue Tylenol, topical therapies, ice/heat -Continue follow-up with orthopedic surgery -Encouraged weight reduction as she is already doing, hopefully this will provide pain relief as well   Tobacco use disorder Not  interested in cutting back.  Will continue to address on follow-up visits.  Essential hypertension, benign Well-controlled.  Continue home Norvasc 10 mg and lisinopril 20 mg.   Follow-up in approximate 3 months or sooner if needed.  Patriciaann Clan, DO 03/23/2019 11:28 AM

## 2019-03-19 LAB — BASIC METABOLIC PANEL
BUN/Creatinine Ratio: 17 (ref 12–28)
BUN: 12 mg/dL (ref 8–27)
CO2: 24 mmol/L (ref 20–29)
Calcium: 10.1 mg/dL (ref 8.7–10.3)
Chloride: 105 mmol/L (ref 96–106)
Creatinine, Ser: 0.72 mg/dL (ref 0.57–1.00)
GFR calc Af Amer: 97 mL/min/{1.73_m2} (ref >59)
GFR calc non Af Amer: 84 mL/min/{1.73_m2} (ref >59)
Glucose: 99 mg/dL (ref 65–99)
Potassium: 4.5 mmol/L (ref 3.5–5.2)
Sodium: 142 mmol/L (ref 134–144)

## 2019-03-19 LAB — LIPID PANEL
Chol/HDL Ratio: 2.6 ratio (ref 0.0–4.4)
Cholesterol, Total: 135 mg/dL (ref 100–199)
HDL: 52 mg/dL (ref >39)
LDL Chol Calc (NIH): 63 mg/dL (ref 0–99)
Triglycerides: 110 mg/dL (ref 0–149)
VLDL Cholesterol Cal: 20 mg/dL (ref 5–40)

## 2019-03-20 MED ORDER — GABAPENTIN 300 MG PO CAPS: ORAL_CAPSULE | ORAL | 2 refills | Status: DC

## 2019-03-20 MED ORDER — CYCLOBENZAPRINE HCL 10 MG PO TABS: 10.0000 mg | ORAL_TABLET | Freq: Two times a day (BID) | ORAL | 1 refills | Status: DC | PRN

## 2019-03-20 MED ORDER — ETODOLAC 200 MG PO CAPS: 200.0000 mg | ORAL_CAPSULE | Freq: Two times a day (BID) | ORAL | 1 refills | Status: DC | PRN

## 2019-03-23 ENCOUNTER — Encounter: Payer: Self-pay | Admitting: Family Medicine

## 2019-03-23 DIAGNOSIS — Z0000 Encounter for general adult medical examination without abnormal findings: Secondary | ICD-10-CM | POA: Insufficient documentation

## 2019-03-23 DIAGNOSIS — M1711 Unilateral primary osteoarthritis, right knee: Secondary | ICD-10-CM | POA: Insufficient documentation

## 2019-03-23 NOTE — Assessment & Plan Note (Signed)
Reviewed health maintenance, medical history, and current medications.  UTD health maintenance with the exception of a low-dose CT screening due to tobacco use, will call and discuss this with patient as unable to address this during her appointment. Congratulated and encouraged her to continue working towards a well-balanced diet and activity as possible (limited by her severe osteoarthritis). -Screening A1c and lipid panel -Will place orders for low-dose CT screening if patient amenable to this

## 2019-03-23 NOTE — Assessment & Plan Note (Signed)
Well-controlled.  Continue home Norvasc 10 mg and lisinopril 20 mg.

## 2019-03-23 NOTE — Assessment & Plan Note (Signed)
Severe, limiting physical function.  Discussed that I am willing to prescribe her Flexeril (understandably on beers list-has been using this once daily and stable) and Etorolac in the interim until she is able to get her right knee replaced especially as these allow her to be functional. However will need to check renal function prior to.  Encouraged her to use these as sparingly as possible and only when pain not controlled with her baseline agents to avoid any worsening renal function or GI bleeding etc. -BMP -Sent in Flexeril and NSAID after Cr WNL, will be temporary prescriptions -Continue Tylenol, topical therapies, ice/heat -Continue follow-up with orthopedic surgery -Encouraged weight reduction as she is already doing, hopefully this will provide pain relief as well

## 2019-03-23 NOTE — Assessment & Plan Note (Signed)
Not interested in cutting back.  Will continue to address on follow-up visits.

## 2019-03-23 NOTE — Assessment & Plan Note (Signed)
Noted in 2018 on CT abdomen.  Postmenopausal without any vaginal bleeding.  Plan was to obtain a pelvic ultrasound when she could afford this, however it was never done.  Will touch base with patient on obtaining this.

## 2019-04-16 ENCOUNTER — Other Ambulatory Visit: Payer: Self-pay | Admitting: Family Medicine

## 2019-04-24 DIAGNOSIS — M47816 Spondylosis without myelopathy or radiculopathy, lumbar region: Secondary | ICD-10-CM | POA: Diagnosis not present

## 2019-04-25 ENCOUNTER — Other Ambulatory Visit: Payer: Self-pay

## 2019-04-25 DIAGNOSIS — I10 Essential (primary) hypertension: Secondary | ICD-10-CM

## 2019-04-25 MED ORDER — AMLODIPINE BESYLATE 10 MG PO TABS: 10.0000 mg | ORAL_TABLET | Freq: Every day | ORAL | 3 refills | Status: DC

## 2019-04-29 ENCOUNTER — Other Ambulatory Visit: Payer: Self-pay | Admitting: Family Medicine

## 2019-04-29 DIAGNOSIS — Z1231 Encounter for screening mammogram for malignant neoplasm of breast: Secondary | ICD-10-CM

## 2019-04-30 DIAGNOSIS — H353221 Exudative age-related macular degeneration, left eye, with active choroidal neovascularization: Secondary | ICD-10-CM | POA: Diagnosis not present

## 2019-05-01 ENCOUNTER — Telehealth: Payer: Self-pay | Admitting: Family Medicine

## 2019-05-01 DIAGNOSIS — F17200 Nicotine dependence, unspecified, uncomplicated: Secondary | ICD-10-CM

## 2019-05-01 DIAGNOSIS — R9389 Abnormal findings on diagnostic imaging of other specified body structures: Secondary | ICD-10-CM

## 2019-05-01 NOTE — Telephone Encounter (Signed)
Delayed follow-up after inability to reach patient.  Able to reach patient today, discussed her desire to get pelvic ultrasound to evaluate endometrial thickness given previous hyperplasia noted on CT scan and low-dose CT lung cancer screening with smoking history.  She is amenable to both if her insurance will cover this--- Red team can we check if her insurance will cover a pelvic ultrasound for endometrial hyperplasia and low-dose CT screening due to tobacco history?    Will place orders, if covered, will need scheduled.  Thank you,  Patriciaann Clan, DO

## 2019-05-02 ENCOUNTER — Telehealth: Payer: Self-pay

## 2019-05-02 NOTE — Telephone Encounter (Signed)
Called patient and LVM for patient to call office regarding upcoming ultrasound at  [facility]  05/07/2019 1430 with an arrival time of 1415  Patient is to arrive with a full bladder of 32 oz's water No voiding (hold)  Please give above information to patient when she calls back.   Ozella Almond, [place]

## 2019-05-05 ENCOUNTER — Telehealth: Payer: Self-pay | Admitting: Family Medicine

## 2019-05-05 ENCOUNTER — Other Ambulatory Visit: Payer: Self-pay | Admitting: Family Medicine

## 2019-05-05 DIAGNOSIS — I10 Essential (primary) hypertension: Secondary | ICD-10-CM

## 2019-05-07 ENCOUNTER — Ambulatory Visit (HOSPITAL_COMMUNITY)
Admission: RE | Admit: 2019-05-07 | Discharge: 2019-05-07 | Disposition: A | Discharge status: Home / Self Care | Payer: Medicare Other | Source: Ambulatory Visit | Attending: Family Medicine | Admitting: Family Medicine

## 2019-05-07 ENCOUNTER — Other Ambulatory Visit: Payer: Self-pay

## 2019-05-07 DIAGNOSIS — R9389 Abnormal findings on diagnostic imaging of other specified body structures: Secondary | ICD-10-CM | POA: Diagnosis not present

## 2019-05-07 DIAGNOSIS — M47816 Spondylosis without myelopathy or radiculopathy, lumbar region: Secondary | ICD-10-CM | POA: Diagnosis not present

## 2019-05-08 ENCOUNTER — Telehealth: Payer: Self-pay

## 2019-05-08 NOTE — Telephone Encounter (Signed)
Opal Sidles, with [facility], calls nurse line to give report on recent pelvic ultrasound. Concerns for cervical neoplasm, recommending MRI with and WO. Please advise.

## 2019-05-09 ENCOUNTER — Other Ambulatory Visit: Payer: Self-pay | Admitting: Family Medicine

## 2019-05-09 ENCOUNTER — Other Ambulatory Visit: Payer: Self-pay

## 2019-05-09 ENCOUNTER — Ambulatory Visit
Admission: RE | Admit: 2019-05-09 | Discharge: 2019-05-09 | Disposition: A | Discharge status: Home / Self Care | Payer: Medicare Other | Source: Ambulatory Visit | Attending: *Deleted | Admitting: *Deleted

## 2019-05-09 ENCOUNTER — Telehealth: Payer: Self-pay | Admitting: Family Medicine

## 2019-05-09 DIAGNOSIS — Z1231 Encounter for screening mammogram for malignant neoplasm of breast: Secondary | ICD-10-CM

## 2019-05-09 DIAGNOSIS — R9389 Abnormal findings on diagnostic imaging of other specified body structures: Secondary | ICD-10-CM

## 2019-05-09 NOTE — Telephone Encounter (Signed)
Discussed recent pelvic ultrasound concerning for cervical neoplasm with on-call gynecologist Dr. Roselie Awkward.  Inquired if I should proceed with MRI as recommended by radiologist or have her evaluated by OB/GYN first, he recommended evaluation first especially as she is asymptomatic.  This ultrasound was obtained to follow-up history of abnormal endometrial hyperplasia seen on CT in 2018 that was not followed up at that time.  She denies any pelvic/abdominal pain, vaginal bleeding.  She has not had a Pap in over 20 years but remembers them to be normal.  Previous history of tubal ligation and D&C.  Spoke with patient and informed her of results.  Placed urgent referral to OB/Gyn and appreciate their assistance.  Patriciaann Clan, DO

## 2019-05-15 ENCOUNTER — Telehealth: Payer: Self-pay | Admitting: *Deleted

## 2019-05-15 DIAGNOSIS — M1711 Unilateral primary osteoarthritis, right knee: Secondary | ICD-10-CM | POA: Diagnosis not present

## 2019-05-15 NOTE — Telephone Encounter (Signed)
Pt calling about urgent OBGYN referral, she said she hasnt heard anything. You told her to call you back if she hadnt. Please advise. Uriyah Raska Kennon Holter, CMA

## 2019-05-22 ENCOUNTER — Other Ambulatory Visit: Payer: Self-pay | Admitting: Family Medicine

## 2019-05-22 DIAGNOSIS — F341 Dysthymic disorder: Secondary | ICD-10-CM

## 2019-05-26 DIAGNOSIS — F17210 Nicotine dependence, cigarettes, uncomplicated: Secondary | ICD-10-CM | POA: Diagnosis not present

## 2019-05-26 DIAGNOSIS — M47816 Spondylosis without myelopathy or radiculopathy, lumbar region: Secondary | ICD-10-CM | POA: Diagnosis not present

## 2019-06-04 DIAGNOSIS — H353221 Exudative age-related macular degeneration, left eye, with active choroidal neovascularization: Secondary | ICD-10-CM | POA: Diagnosis not present

## 2019-06-05 DIAGNOSIS — M47816 Spondylosis without myelopathy or radiculopathy, lumbar region: Secondary | ICD-10-CM | POA: Diagnosis not present

## 2019-06-08 ENCOUNTER — Other Ambulatory Visit: Payer: Self-pay | Admitting: Family Medicine

## 2019-06-08 DIAGNOSIS — G4700 Insomnia, unspecified: Secondary | ICD-10-CM

## 2019-06-10 ENCOUNTER — Telehealth (INDEPENDENT_AMBULATORY_CARE_PROVIDER_SITE_OTHER): Payer: Medicare Other | Admitting: Family Medicine

## 2019-06-10 ENCOUNTER — Other Ambulatory Visit: Payer: Self-pay

## 2019-06-10 DIAGNOSIS — J301 Allergic rhinitis due to pollen: Secondary | ICD-10-CM

## 2019-06-10 DIAGNOSIS — M1711 Unilateral primary osteoarthritis, right knee: Secondary | ICD-10-CM

## 2019-06-10 DIAGNOSIS — F341 Dysthymic disorder: Secondary | ICD-10-CM

## 2019-06-10 DIAGNOSIS — F17200 Nicotine dependence, unspecified, uncomplicated: Secondary | ICD-10-CM

## 2019-06-10 DIAGNOSIS — R9389 Abnormal findings on diagnostic imaging of other specified body structures: Secondary | ICD-10-CM

## 2019-06-10 NOTE — Progress Notes (Signed)
[facility] Telemedicine Visit  Patient consented to have virtual visit and was identified by name and date of birth. Method of visit: Telephone  Encounter participants: Patient: Alexis Cole - located at Home Provider: Patriciaann Clan - located at [facility] Others (if applicable): none   Chief Complaint: General follow up   HPI: [name] is a 74 year old female presented via phone for follow-up.  States she is doing well today.  She recently had a sciatic nerve ablation by Dr. Ronalee Red and has been quite pleased, getting back some feeling on the left side of her groin the previously numb from past surgical interventions.  She also has an appointment scheduled with her orthopedic surgeon on May 5 to discuss proceeding right knee replacement.  She would like a letter to have a supportive dog at her apartment.  Her dog recently passed away in winter 2020.  Feels that this substantially improves her mental health.   Reports she is down to 1 pack a day, approximately 1/3 pack down in the past 1-2 months.  She increased her smoking over the wintertime due to her pet loss. She is also lost 3 pounds and is hoping to lose more, wants to see if she can cut back on the Lexapro.   Lastly, reports increased nasal congestion and post-drainage nonproductive cough for the past few weeks.  Denies any difficulty breathing, fever, sore throat, myalgias, change in taste/smell.  She has been using Afrin, Flonase, and her nasal lavage with some relief.  Has a known history of severe allergies during the spring/summer.  No known exposures of Covid.  Has not been using Claritin.  ROS: per HPI  Pertinent PMHx: Hypertension, osteoarthritis of knee/hips, tobacco use, anxiety/depression, hyperlipidemia  Exam:  There were no vitals taken for this visit.  Respiratory: Unlabored breathing, occasional hacking cough during conversation  Assessment/Plan:  ANXIETY DEPRESSION Improving, stable on  Lexapro and BuSpar. Per patient request, will decrease Lexapro to 5 mg as she feels contributing to weight loss difficulties.  She is looking into getting a support dog and requesting a letter to give to her apartment, still in grief of losing her dog late last year.  Will provide with letter, believe this is a reasonable request.   Tobacco use disorder Congratulated patient on her efforts to cut back recently, encouraged her to continue doing so.  Recently tried to obtain a low-dose CT scan, however inform she did not qualify.  As guidelines and recommendations have recently changed, will reattempt to obtain this through her insurance.  She has a 20+ pack year history.  Osteoarthritis of right knee Severe.  Meeting with her orthopedic surgeon, Dr. Berenice Primas, in 06/2019 to discuss knee replacement.  Allergic rhinitis Currently with congestion and postnasal cough.  Frequent problem for her during this year, also tends to develop sinusitis.  Recommended restarting Claritin and trialing tea/honey in addition to her Flonase/nasal lavage.  Instructed to discontinue Afrin as this can contribute to rebound nasal congestion.  Given her pack-year history and what may be a chronic cough, could consider obtaining PFTs to assess for COPD, however patient does report previous use of inhalers without benefit.  Abnormal pelvic ultrasound Evaluation by OB/GYN scheduled for 4/24, patient reminded of appointment.  No associated vaginal bleeding.   Time spent during visit with patient: 20 minutes   Follow-up if symptoms above not improving or worsening, otherwise will follow up in approximately 3 months or sooner if needed.  Patriciaann Clan,  DO

## 2019-06-12 ENCOUNTER — Encounter: Payer: Self-pay | Admitting: Family Medicine

## 2019-06-12 DIAGNOSIS — J309 Allergic rhinitis, unspecified: Secondary | ICD-10-CM | POA: Insufficient documentation

## 2019-06-12 DIAGNOSIS — R9389 Abnormal findings on diagnostic imaging of other specified body structures: Secondary | ICD-10-CM | POA: Insufficient documentation

## 2019-06-12 NOTE — Assessment & Plan Note (Addendum)
Congratulated patient on her efforts to cut back recently, encouraged her to continue doing so.  Recently tried to obtain a low-dose CT scan, however inform she did not qualify.  As guidelines and recommendations have recently changed, will reattempt to obtain this through her insurance.  She has a 20+ pack year history.

## 2019-06-12 NOTE — Assessment & Plan Note (Signed)
Currently with congestion and postnasal cough.  Frequent problem for her during this year, also tends to develop sinusitis.  Recommended restarting Claritin and trialing tea/honey in addition to her Flonase/nasal lavage.  Instructed to discontinue Afrin as this can contribute to rebound nasal congestion.  Given her pack-year history and what may be a chronic cough, could consider obtaining PFTs to assess for COPD, however patient does report previous use of inhalers without benefit.

## 2019-06-12 NOTE — Assessment & Plan Note (Signed)
Improving, stable on Lexapro and BuSpar. Per patient request, will decrease Lexapro to 5 mg as she feels contributing to weight loss difficulties.  She is looking into getting a support dog and requesting a letter to give to her apartment, still in grief of losing her dog late last year.  Will provide with letter, believe this is a reasonable request.

## 2019-06-12 NOTE — Assessment & Plan Note (Signed)
Severe.  Meeting with her orthopedic surgeon, Dr. Berenice Primas, in 06/2019 to discuss knee replacement.

## 2019-06-12 NOTE — Assessment & Plan Note (Signed)
Evaluation by OB/GYN scheduled for 4/24, patient reminded of appointment.  No associated vaginal bleeding.

## 2019-06-13 ENCOUNTER — Ambulatory Visit (INDEPENDENT_AMBULATORY_CARE_PROVIDER_SITE_OTHER): Payer: Medicare Other | Admitting: Obstetrics and Gynecology

## 2019-06-13 ENCOUNTER — Other Ambulatory Visit: Payer: Self-pay | Admitting: Family Medicine

## 2019-06-13 ENCOUNTER — Other Ambulatory Visit: Payer: Self-pay

## 2019-06-13 ENCOUNTER — Other Ambulatory Visit (HOSPITAL_COMMUNITY)
Admission: RE | Admit: 2019-06-13 | Discharge: 2019-06-13 | Disposition: A | Discharge status: Home / Self Care | Payer: Medicare Other | Source: Ambulatory Visit | Attending: Obstetrics and Gynecology | Admitting: Obstetrics and Gynecology

## 2019-06-13 ENCOUNTER — Encounter: Payer: Self-pay | Admitting: Obstetrics and Gynecology

## 2019-06-13 VITALS — BP 116/77 | HR 101 | Wt 212.9 lb

## 2019-06-13 DIAGNOSIS — Z124 Encounter for screening for malignant neoplasm of cervix: Secondary | ICD-10-CM | POA: Insufficient documentation

## 2019-06-13 DIAGNOSIS — Z1272 Encounter for screening for malignant neoplasm of vagina: Secondary | ICD-10-CM | POA: Diagnosis not present

## 2019-06-13 NOTE — Progress Notes (Signed)
74 yo referred for evaluation of abnormal ultrasound. Patient reports doing well and is without complaints. She denies pelvic pain or abnormal discharge. She has been postmenopausal without any episode of vaginal bleeding.  Past Medical History:  Diagnosis Date  . Acne    but not being treated for  . Acute recurrent sinusitis 08/13/2014  . Anemia    history of--many yrs ago  . Anxiety    but doesn't take any meds for this  . Aortic atherosclerosis ([place]) 06/08/2016   noted on CT abd/pelvis  . Arthritis   . Arthropathy, lower leg 02/10/2009   For eye care pt is going to Dr. Katy Fitch, and her Ortho surgeon is Dr. Dorna Leitz and [facility].    . Back pain 11/17/2015  . Breast mass in female 08/20/2014   IMPRESSION: Further evaluation is suggested for possible mass in the left breast.The patient will be contacted regarding the findings, and additional imaging will be scheduled.  BI-RADS CATEGORY 0: Incomplete. Need additional imaging evaluation and/or prior mammograms for comparison.   . Chronic back pain   . Complication of anesthesia    States she woke up just after parathyroid surgery while still in OR and attempted to get off the bed, remebers seeing staffs back towards her  . Depression   . Diarrhea   . Diverticulitis 06/08/2016   noted on CT abd/pelvis  . Diverticulosis 06/08/2016   noted on CT abd/pelvis  . Dyslipidemia   . Dyspnea 10/26/2017  . Estrogen deficiency 08/13/2014  . Fatigue 02/24/2016  . Fatty liver 06/08/2016   severe noted on CT abd/pelvis   . Gastric ulcer    history of--many yrs ago  . General weakness 06/08/2016  . Headache(784.0)    History of migraine  . Heart murmur   . History of bronchitis    last time 63months  . History of syncope   . Hx of migraines    last one a month ago  . Hypercalcemia 12/12/2016  . Hyperparathyroidism ([place]) 12/12/2016  . Hypertension     Dr     Higinio Plan  . Insomnia    Nyquil nightly  . Joint pain   . Joint swelling   .  Lactose intolerance   . Nausea and vomiting   . Obesity   . Pneumonia 03/15/2016   left lower lobe, left lower lobe atelectasis, elevated left hemidiaphragm noted again, small left pleural effusion  . Pre-syncope 04/12/2016  . PTSD (post-traumatic stress disorder)   . Saddle anesthesia 06/28/2018  . Sinusitis   . Urge incontinence of urine    Past Surgical History:  Procedure Laterality Date  . CATARACT EXTRACTION, BILATERAL    . CERVICAL CONE BIOPSY    . DILATION AND CURETTAGE OF UTERUS  30+yrs ago  . MOUTH SURGERY    . NASAL SEPTUM SURGERY  40+yrs ago  . PARATHYROIDECTOMY Left 01/04/2017   Procedure: LEFT SUPERIOR PARATHYROIDECTOMY;  Surgeon: Armandina Gemma, MD;  Location: [facility];  Service: General;  Laterality: Left;  . TOTAL HIP ARTHROPLASTY  09/01/2011   Procedure: TOTAL HIP ARTHROPLASTY;  Surgeon: [name], MD;  Location: [place] [facility] [place];  Service: Orthopedics;  Laterality: Right;  . TOTAL HIP ARTHROPLASTY Left 07/26/2018   Procedure: TOTAL HIP ARTHROPLASTY ANTERIOR APPROACH;  Surgeon: Dorna Leitz, MD;  Location: [facility];  Service: Orthopedics;  Laterality: Left;  . TOTAL KNEE ARTHROPLASTY Left 03/09/2017   Procedure: LEFT TOTAL KNEE ARTHROPLASTY;  Surgeon: Dorna Leitz, MD;  Location: [facility];  Service: Orthopedics;  Laterality: Left;  . TUBAL LIGATION  30+yrs ago   Family History  Problem Relation Age of Onset  . Hypertension Mother   . Arthritis-Osteo Mother   . Irritable bowel syndrome Mother   . Cancer - Prostate Father   . High blood pressure Brother   . Diabetes Mellitus II Maternal Grandmother   . Heart attack Maternal Grandfather   . Hyperlipidemia Neg Hx   . Diabetes Neg Hx   . Sudden death Neg Hx    Social History   Tobacco Use  . Smoking status: Current Every Day Smoker    Packs/day: 0.50    Years: 15.00    Pack years: 7.50    Types: Cigarettes    Start date: 02/21/1968  . Smokeless tobacco: Never Used  . Tobacco comment: quit in the past for 9 years.  Previous  1ppd  Substance Use Topics  . Alcohol use: No    Alcohol/week: 0.0 standard drinks  . Drug use: No   ROS See pertinent in HPI Blood pressure 116/77, pulse (!) 101, weight 212 lb 14.4 oz (96.6 kg). GENERAL: Well-developed, well-nourished female in no acute distress.  ABDOMEN: Soft, nontender, nondistended. No organomegaly. PELVIC: Normal external female genitalia. Vagina is pale and rugated.  Normal discharge. Normal appearing cervix. Uterus is normal in size.  No adnexal mass or tenderness. EXTREMITIES: No cyanosis, clubbing, or edema, 2+ distal pulses.  05/07/19 ultrasound FINDINGS: Uterus  Measurements: 4.7 x 2.5 x 2.9 cm = volume: 18 mL. Atrophic. Anteverted. Heterogeneous myometrium. Ill-defined area of hyperechogenicity identified within the anterior aspect of the lower uterine segment and cervix measuring 2.7 x 2.2 x 2.3 cm and potentially extending into the parametrial soft tissue. Finding is concerning for a cervical neoplasm. Small calcification identified at upper uterine segment posteriorly, 5 mm diameter. Small nabothian cysts in cervix.  Endometrium  Thickness: 3 mm.  No endometrial fluid or focal abnormality  Right ovary  Not visualized, likely obscured by bowel  Left ovary  Not visualized, likely obscured by bowel  Other findings  No free pelvic fluid or adnexal masses.  IMPRESSION: Poorly defined area of hyperechogenicity 2.7 x 2.2 x 2.3 cm in size at the anterior aspect of the lower uterine segment and cervix potentially extending into parametrial soft tissue, concerning for a cervical neoplasm.  Consider MR imaging with and without contrast for further assessment.  Nonvisualization of ovaries.  Remainder of exam unremarkable.  These results will be called to the ordering clinician or representative by the Radiologist Assistant, and communication documented in the PACS or [employer].   Electronically Signed   By:  Lavonia Dana M.D.   On: 05/07/2019 17:32  A/P 74 yo postmenopausal with abnormal ultrasound concerning for cervical cancer - Pap smear collected - Patient will be contacted with abnormal results

## 2019-06-16 LAB — CYTOLOGY - PAP
Adequacy: ABSENT
Diagnosis: NEGATIVE

## 2019-06-26 ENCOUNTER — Telehealth: Payer: Self-pay

## 2019-06-26 DIAGNOSIS — M25561 Pain in right knee: Secondary | ICD-10-CM | POA: Diagnosis not present

## 2019-06-26 NOTE — Telephone Encounter (Signed)
Patient calls nurse line stating she has not heard anything in regards to her pap smear done by Dr. Roland Rack. Per chart review, looks like her pap smear is normal. I advised her of this and advised her to contact their office if she has any other questions. Patient also asked about her letter PCP wrote for an emotional support animal. I have printed this and placed in mail per patients request.

## 2019-06-27 ENCOUNTER — Other Ambulatory Visit: Payer: Self-pay | Admitting: Orthopedic Surgery

## 2019-06-27 ENCOUNTER — Other Ambulatory Visit: Payer: Self-pay | Admitting: Family Medicine

## 2019-06-27 DIAGNOSIS — R0600 Dyspnea, unspecified: Secondary | ICD-10-CM

## 2019-07-02 ENCOUNTER — Ambulatory Visit (INDEPENDENT_AMBULATORY_CARE_PROVIDER_SITE_OTHER): Payer: Medicare Other | Admitting: Family Medicine

## 2019-07-02 ENCOUNTER — Other Ambulatory Visit: Payer: Self-pay

## 2019-07-02 VITALS — BP 118/68 | HR 81 | Ht 66.0 in | Wt 213.4 lb

## 2019-07-02 DIAGNOSIS — M1711 Unilateral primary osteoarthritis, right knee: Secondary | ICD-10-CM | POA: Diagnosis not present

## 2019-07-02 NOTE — Progress Notes (Signed)
    SUBJECTIVE:   CHIEF COMPLAINT / HPI: Surgical clearance  [name] is a 74 year old female presenting for surgical clearance.  She has a total right knee arthroplasty scheduled for 5/28 with Dr. Berenice Primas due to her severe osteoarthritis.  He has also performed her bilateral total hip arthroplasties and left knee arthroplasty in 2020, 2019/2018, and 2013.  Will be completed with epidural, has not had difficulty with anesthesia in the past. She has no previous history of MI, CVA, heart failure, valvular disease, or arrhythmias.  Denies any current/recent chest pain at rest or with activity, dyspnea on exertion, lightheadedness/dizziness, fatigue, or palpitations.  Not on any blood thinners.  Assessment of physical capacity is limited due to her severe OA, however after 20 minutes she has to stop activity due to her knee not any cardiac/pulmonary concerns.   Having an EKG and preoperative labs tomorrow.   PERTINENT  PMH / PSH: Tobacco use, hypertension, elevated BMI, allergies, OA   OBJECTIVE:   BP 118/68   Pulse 81   Ht 5\' 6"  (1.676 m)   Wt 213 lb 6.4 oz (96.8 kg)   SpO2 99%   BMI 34.44 kg/m   General: Alert, NAD HEENT: NCAT, MMM Cardiac: RRR no m/g/r, normal S1 and S2 Lungs: Clear bilaterally, no increased WOB without any wheezing or crackles Msk: Moves all extremities spontaneously  Ext: Warm, dry, 2+ distal pulses, no edema bilaterally   ASSESSMENT/PLAN:   Osteoarthritis of right knee Undergoing total knee arthroplasty with Dr. Berenice Primas on 5/28.  Through evaluation and review of medical history, she is considered medically low risk and would be reasonable to proceed with surgery without additional testing. 0.2% risk of MI/cardiac arrest intraoperatively/30 days postop via Lyndel Safe perioperative risk score.    Follow up in 3 months for chronic care or sooner if needed.   Alexis Cole, [place]

## 2019-07-02 NOTE — Patient Instructions (Signed)
Wonderful to see you per usual! You are considered a low risk for this low risk surgery. I will document this. Additionally, I will touch base with you on the CT low dose lung cancer screening in next week.

## 2019-07-03 ENCOUNTER — Encounter: Payer: Self-pay | Admitting: Family Medicine

## 2019-07-03 NOTE — Assessment & Plan Note (Signed)
Undergoing total knee arthroplasty with Dr. Berenice Primas on 5/28.  Through evaluation and review of medical history, she is considered medically low risk and would be reasonable to proceed with surgery without additional testing. 0.2% risk of MI/cardiac arrest intraoperatively/30 days postop via Lyndel Safe perioperative risk score.

## 2019-07-04 NOTE — Care Plan (Signed)
Ortho Bundle Case Management Note  Patient Details  Name: Alexis Cole MRN: [id number] Date of Birth: 06-Jun-1945    Spoke with patient prior to surgery. She will discharge to home with family/friends to assist. She has a rolling walker, CPM ordered. HHPT referral to [facility] and OPPT set up with [place]  Patient and MD in agreement with plan. Choice offered                  DME Arranged:  CPM DME Agency:  [employer]  HH Arranged:  PT [place] Agency:  [facility] (now [facility])  Additional Comments: Please contact me with any questions of if this plan should need to change.  Ladell Heads,  [place] Specialist  [phone] 07/04/2019, 11:59 AM

## 2019-07-07 ENCOUNTER — Telehealth: Payer: Self-pay

## 2019-07-07 DIAGNOSIS — M47816 Spondylosis without myelopathy or radiculopathy, lumbar region: Secondary | ICD-10-CM | POA: Diagnosis not present

## 2019-07-07 NOTE — Telephone Encounter (Signed)
[facility] calls nurse line checking on status of surgery clearance form. I did not see this in PCP box. Per Cassie Freer they faxed on 05/7. Representative is faxing another copy. This needs to be filled out asap, surgery date 07/18/2019.

## 2019-07-09 NOTE — Patient Instructions (Signed)
DUE TO COVID-19 ONLY ONE VISITOR IS ALLOWED TO COME WITH YOU AND STAY IN THE WAITING ROOM ONLY DURING PRE OP AND PROCEDURE DAY OF SURGERY. THE 2 VISITORS  MAY VISIT WITH YOU AFTER SURGERY IN YOUR PRIVATE ROOM DURING VISITING HOURS ONLY!  YOU NEED TO HAVE A COVID 19 TEST ON__5/25_____ @_10 :45______, THIS TEST MUST BE DONE BEFORE SURGERY, COME  [address], [place] , [address].  ([place]) ONCE YOUR COVID TEST IS COMPLETED, PLEASE BEGIN THE QUARANTINE INSTRUCTIONS AS OUTLINED IN YOUR HANDOUT.                Alexis Cole    Your procedure is scheduled on: 07/16/19   Report to [facility] Main  Entrance   Report to admitting at  7:45 AM     Call this number if you have problems the morning of surgery [phone]     Midnight. BRUSH YOUR TEETH MORNING OF SURGERY AND RINSE YOUR MOUTH OUT, NO CHEWING GUM CANDY OR MINTS.   Do not eat food After Midnight.   YOU MAY HAVE CLEAR LIQUIDS FROM MIDNIGHT UNTIL 7:00 AM.   At 7:00 AM Please finish the prescribed Pre-Surgery  drink.   Nothing by mouth after you finish the  drink !   Take these medicines the morning of surgery with A SIP OF WATER: Buspirone, Gabapentin, Lexapro,Amlodipine, Flonase if needed. Use your inhaler and bring it with you to the hospital                                 You may not have any metal on your body including               piercings  Do not wear jewelry, lotions, powders or deodorant, make up                    Do not bring valuables to the hospital. [place].  Contacts, dentures or bridgework may not be worn into surgery.  Leave suitcase in the car. After surgery it may be brought to your room.     Patients discharged the day of surgery will not be allowed to drive home.   IF YOU ARE HAVING SURGERY AND GOING HOME THE SAME DAY, YOU MUST HAVE AN ADULT TO DRIVE YOU HOME AND BE WITH YOU FOR 24 HOURS.   YOU MAY GO HOME BY  TAXI OR UBER OR ORTHERWISE, BUT AN ADULT MUST ACCOMPANY YOU HOME AND STAY WITH YOU FOR 24 HOURS.  Name and phone number of your driver:  Special Instructions: N/A              Please read over the following fact sheets you were given: _____________________________________________________________________             [facility] - Preparing for Surgery Before surgery, you can play an important role.   Because skin is not sterile, your skin needs to be as free of germs as possible.   You can reduce the number of germs on your skin by washing with CHG (chlorahexidine gluconate) soap before surgery.   CHG is an antiseptic cleaner which kills germs and bonds with the skin to continue killing germs even after washing. Please DO NOT use if you have an allergy to CHG  or antibacterial soaps.   If your skin becomes reddened/irritated stop using the CHG and inform your nurse when you arrive at Short Stay. Do not shave (including legs and underarms) for at least 48 hours prior to the first CHG shower.    Please follow these instructions carefully:  1.  Shower with CHG Soap the night before surgery and the  morning of Surgery.  2.  If you choose to wash your hair, wash your hair first as usual with your  normal  shampoo.  3.  After you shampoo, rinse your hair and body thoroughly to remove the  shampoo.                                        4.  Use CHG as you would any other liquid soap.  You can apply chg directly  to the skin and wash                       Gently with a scrungie or clean washcloth.  5.  Apply the CHG Soap to your body ONLY FROM THE NECK DOWN.   Do not use on face/ open                           Wound or open sores. Avoid contact with eyes, ears mouth and genitals (private parts).                       Wash face,  Genitals (private parts) with your normal soap.             6.  Wash thoroughly, paying special attention to the area where your surgery  will be performed.  7.   Thoroughly rinse your body with warm water from the neck down.  8.  DO NOT shower/wash with your normal soap after using and rinsing off  the CHG Soap.             9.  Pat yourself dry with a clean towel.            10.  Wear clean pajamas.            11.  Place clean sheets on your bed the night of your first shower and do not  sleep with pets. Day of Surgery : Do not apply any lotions/deodorants the morning of surgery.  Please wear clean clothes to the hospital/surgery center.  FAILURE TO FOLLOW THESE INSTRUCTIONS MAY RESULT IN THE CANCELLATION OF YOUR SURGERY PATIENT SIGNATURE_________________________________  NURSE SIGNATURE__________________________________  ________________________________________________________________________   Adam Phenix  An incentive spirometer is a tool that can help keep your lungs clear and active. This tool measures how well you are filling your lungs with each breath. Taking long deep breaths may help reverse or decrease the chance of developing breathing (pulmonary) problems (especially infection) following:  A long period of time when you are unable to move or be active. BEFORE THE PROCEDURE   If the spirometer includes an indicator to show your best effort, your nurse or respiratory therapist will set it to a desired goal.  If possible, sit up straight or lean slightly forward. Try not to slouch.  Hold the incentive spirometer in an upright position. INSTRUCTIONS FOR USE  1. Sit on the edge of your bed if possible, or  sit up as far as you can in bed or on a chair. 2. Hold the incentive spirometer in an upright position. 3. Breathe out normally. 4. Place the mouthpiece in your mouth and seal your lips tightly around it. 5. Breathe in slowly and as deeply as possible, raising the piston or the ball toward the top of the column. 6. Hold your breath for 3-5 seconds or for as long as possible. Allow the piston or ball to fall to the bottom of  the column. 7. Remove the mouthpiece from your mouth and breathe out normally. 8. Rest for a few seconds and repeat Steps 1 through 7 at least 10 times every 1-2 hours when you are awake. Take your time and take a few normal breaths between deep breaths. 9. The spirometer may include an indicator to show your best effort. Use the indicator as a goal to work toward during each repetition. 10. After each set of 10 deep breaths, practice coughing to be sure your lungs are clear. If you have an incision (the cut made at the time of surgery), support your incision when coughing by placing a pillow or rolled up towels firmly against it. Once you are able to get out of bed, walk around indoors and cough well. You may stop using the incentive spirometer when instructed by your caregiver.  RISKS AND COMPLICATIONS  Take your time so you do not get dizzy or light-headed.  If you are in pain, you may need to take or ask for pain medication before doing incentive spirometry. It is harder to take a deep breath if you are having pain. AFTER USE  Rest and breathe slowly and easily.  It can be helpful to keep track of a log of your progress. Your caregiver can provide you with a simple table to help with this. If you are using the spirometer at home, follow these instructions: [place] IF:   You are having difficultly using the spirometer.  You have trouble using the spirometer as often as instructed.  Your pain medication is not giving enough relief while using the spirometer.  You develop fever of 100.5 F (38.1 C) or higher. SEEK IMMEDIATE MEDICAL CARE IF:   You cough up bloody sputum that had not been present before.  You develop fever of 102 F (38.9 C) or greater.  You develop worsening pain at or near the incision site. MAKE SURE YOU:   Understand these instructions.  Will watch your condition.  Will get help right away if you are not doing well or get worse. Document  Released: 06/19/2006 Document Revised: 05/01/2011 Document Reviewed: 08/20/2006 [facility] Patient Information 2014 [place], [place].   ________________________________________________________________________

## 2019-07-09 NOTE — Telephone Encounter (Signed)
Completed, thank you! Patriciaann Clan, DO

## 2019-07-10 ENCOUNTER — Ambulatory Visit (HOSPITAL_COMMUNITY)
Admission: RE | Admit: 2019-07-10 | Discharge: 2019-07-10 | Disposition: A | Discharge status: Home / Self Care | Payer: Medicare Other | Source: Ambulatory Visit | Attending: Orthopedic Surgery | Admitting: Orthopedic Surgery

## 2019-07-10 ENCOUNTER — Encounter (HOSPITAL_COMMUNITY)
Admission: RE | Admit: 2019-07-10 | Discharge: 2019-07-10 | Disposition: A | Discharge status: Home / Self Care | Payer: Medicare Other | Source: Ambulatory Visit | Attending: Orthopedic Surgery | Admitting: Orthopedic Surgery

## 2019-07-10 ENCOUNTER — Other Ambulatory Visit: Payer: Self-pay

## 2019-07-10 ENCOUNTER — Encounter (HOSPITAL_COMMUNITY): Payer: Self-pay

## 2019-07-10 DIAGNOSIS — Z01811 Encounter for preprocedural respiratory examination: Secondary | ICD-10-CM

## 2019-07-10 DIAGNOSIS — R9431 Abnormal electrocardiogram [ECG] [EKG]: Secondary | ICD-10-CM | POA: Diagnosis not present

## 2019-07-10 DIAGNOSIS — J9811 Atelectasis: Secondary | ICD-10-CM | POA: Insufficient documentation

## 2019-07-10 DIAGNOSIS — I444 Left anterior fascicular block: Secondary | ICD-10-CM | POA: Diagnosis not present

## 2019-07-10 DIAGNOSIS — Z01818 Encounter for other preprocedural examination: Secondary | ICD-10-CM | POA: Insufficient documentation

## 2019-07-10 LAB — COMPREHENSIVE METABOLIC PANEL
ALT: 18 U/L (ref 0–44)
AST: 19 U/L (ref 15–41)
Albumin: 4.5 g/dL (ref 3.5–5.0)
Alkaline Phosphatase: 79 U/L (ref 38–126)
Anion gap: 9 (ref 5–15)
BUN: 28 mg/dL — ABNORMAL HIGH (ref 8–23)
CO2: 26 mmol/L (ref 22–32)
Calcium: 10 mg/dL (ref 8.9–10.3)
Chloride: 106 mmol/L (ref 98–111)
Creatinine, Ser: 1 mg/dL (ref 0.44–1.00)
GFR calc Af Amer: >60 mL/min (ref >60)
GFR calc non Af Amer: 56 mL/min — ABNORMAL LOW (ref >60)
Glucose, Bld: 134 mg/dL — ABNORMAL HIGH (ref 70–99)
Potassium: 4.4 mmol/L (ref 3.5–5.1)
Sodium: 141 mmol/L (ref 135–145)
Total Bilirubin: 0.5 mg/dL (ref 0.3–1.2)
Total Protein: 7.3 g/dL (ref 6.5–8.1)

## 2019-07-10 LAB — SURGICAL PCR SCREEN
MRSA, PCR: NEGATIVE
Staphylococcus aureus: NEGATIVE

## 2019-07-10 LAB — URINALYSIS, ROUTINE W REFLEX MICROSCOPIC
Bilirubin Urine: NEGATIVE
Glucose, UA: 50 mg/dL — AB
Hgb urine dipstick: NEGATIVE
Ketones, ur: NEGATIVE mg/dL
Leukocytes,Ua: NEGATIVE
Nitrite: NEGATIVE
Protein, ur: NEGATIVE mg/dL
Specific Gravity, Urine: 1.004 — ABNORMAL LOW (ref 1.005–1.030)
pH: 6 (ref 5.0–8.0)

## 2019-07-10 LAB — CBC WITH DIFFERENTIAL/PLATELET
Abs Immature Granulocytes: 0.03 10*3/uL (ref 0.00–0.07)
Basophils Absolute: 0.1 10*3/uL (ref 0.0–0.1)
Basophils Relative: 1 %
Eosinophils Absolute: 0.1 10*3/uL (ref 0.0–0.5)
Eosinophils Relative: 1 %
HCT: 44.1 % (ref 36.0–46.0)
Hemoglobin: 14 g/dL (ref 12.0–15.0)
Immature Granulocytes: 0 %
Lymphocytes Relative: 41 %
Lymphs Abs: 3.1 10*3/uL (ref 0.7–4.0)
MCH: 31.1 pg (ref 26.0–34.0)
MCHC: 31.7 g/dL (ref 30.0–36.0)
MCV: 98 fL (ref 80.0–100.0)
Monocytes Absolute: 0.7 10*3/uL (ref 0.1–1.0)
Monocytes Relative: 9 %
Neutro Abs: 3.5 10*3/uL (ref 1.7–7.7)
Neutrophils Relative %: 48 %
Platelets: 219 10*3/uL (ref 150–400)
RBC: 4.5 MIL/uL (ref 3.87–5.11)
RDW: 12.8 % (ref 11.5–15.5)
WBC: 7.5 10*3/uL (ref 4.0–10.5)
nRBC: 0 % (ref 0.0–0.2)

## 2019-07-10 LAB — PROTIME-INR
INR: 1 (ref 0.8–1.2)
Prothrombin Time: 12.5 seconds (ref 11.4–15.2)

## 2019-07-10 LAB — APTT: aPTT: 30 seconds (ref 24–36)

## 2019-07-10 NOTE — Progress Notes (Signed)
PCP - Dr. Nyra Jabs Cardiologist -   Chest x-ray - 07/10/19 EKG - 07/10/19 Stress Test - no ECHO - 2018 Cardiac Cath - no  Sleep Study - no CPAP -   Fasting Blood Sugar - NA Checks Blood Sugar _____ times a day  Blood Thinner Instructions:NA Aspirin Instructions: Last Dose:  Anesthesia review:   Patient denies shortness of breath, fever, cough and chest pain at PAT appointment yes  Patient verbalized understanding of instructions that were given to them at the PAT appointment. Patient was also instructed that they will need to review over the PAT instructions again at home before surgery. yes

## 2019-07-14 ENCOUNTER — Other Ambulatory Visit: Payer: Self-pay

## 2019-07-14 DIAGNOSIS — F341 Dysthymic disorder: Secondary | ICD-10-CM

## 2019-07-14 MED ORDER — ESCITALOPRAM OXALATE 5 MG PO TABS: 5.0000 mg | ORAL_TABLET | Freq: Every day | ORAL | 2 refills | Status: DC

## 2019-07-14 NOTE — Telephone Encounter (Signed)
Patient calls nurse line she would like to continue on Lexapro 5mg .

## 2019-07-15 ENCOUNTER — Other Ambulatory Visit (HOSPITAL_COMMUNITY)
Admission: RE | Admit: 2019-07-15 | Discharge: 2019-07-15 | Disposition: A | Discharge status: Home / Self Care | Payer: Medicare Other | Source: Ambulatory Visit | Attending: Orthopedic Surgery | Admitting: Orthopedic Surgery

## 2019-07-15 DIAGNOSIS — Z20822 Contact with and (suspected) exposure to covid-19: Secondary | ICD-10-CM | POA: Diagnosis not present

## 2019-07-15 DIAGNOSIS — Z01812 Encounter for preprocedural laboratory examination: Secondary | ICD-10-CM | POA: Insufficient documentation

## 2019-07-15 LAB — SARS CORONAVIRUS 2 (TAT 6-24 HRS): SARS Coronavirus 2: NEGATIVE

## 2019-07-15 NOTE — Anesthesia Preprocedure Evaluation (Addendum)
Anesthesia Evaluation  Patient identified by MRN, date of birth, ID band Patient awake    Reviewed: Allergy & Precautions, NPO status , Patient's Chart, lab work & pertinent test results  History of Anesthesia Complications Negative for: history of anesthetic complications  Airway Mallampati: II  TM Distance: >3 FB Neck ROM: Full    Dental   Pulmonary Current Smoker,    Pulmonary exam normal        Cardiovascular hypertension, Pt. on medications Normal cardiovascular exam     Neuro/Psych  Headaches, Anxiety Depression    GI/Hepatic PUD, NASH   Endo/Other  negative endocrine ROS  Renal/GU negative Renal ROS  negative genitourinary   Musculoskeletal  (+) Arthritis , Osteoarthritis,    Abdominal   Peds  Hematology negative hematology ROS (+)   Anesthesia Other Findings   Reproductive/Obstetrics                            Anesthesia Physical Anesthesia Plan  ASA: II  Anesthesia Plan: Spinal   Post-op Pain Management:    Induction:   PONV Risk Score and Plan: 2 and Propofol infusion, Treatment may vary due to age or medical condition, Ondansetron and TIVA  Airway Management Planned: Nasal Cannula and Simple Face Mask  Additional Equipment: None  Intra-op Plan:   Post-operative Plan:   Informed Consent: I have reviewed the patients History and Physical, chart, labs and discussed the procedure including the risks, benefits and alternatives for the proposed anesthesia with the patient or authorized representative who has indicated his/her understanding and acceptance.     Dental advisory given  Plan Discussed with:   Anesthesia Plan Comments: (Pt seen by PCP 07/02/2019.  Per OV note, "Undergoing total knee arthroplasty with Dr. Berenice Primas on 5/28.  Through evaluation and review of medical history, she is considered medically low risk and would be reasonable to proceed with surgery  without additional testing. 0.2% risk of MI/cardiac arrest intraoperatively/30 days postop via Lyndel Safe perioperative risk score.")       Anesthesia Quick Evaluation

## 2019-07-16 DIAGNOSIS — H353221 Exudative age-related macular degeneration, left eye, with active choroidal neovascularization: Secondary | ICD-10-CM | POA: Diagnosis not present

## 2019-07-17 MED ORDER — BUPIVACAINE LIPOSOME 1.3 % IJ SUSP
20.0000 mL | Freq: Once | INTRAMUSCULAR | Status: DC
  Filled 2019-07-17: qty 20

## 2019-07-17 NOTE — H&P (Signed)
TOTAL KNEE ADMISSION H&P  Patient is being admitted for right total knee arthroplasty.  Subjective:  Chief Complaint:right knee pain.  HPI: Alexis Cole, 74 y.o. female, has a history of pain and functional disability in the right knee due to arthritis and has failed non-surgical conservative treatments for greater than 12 weeks to includeNSAID's and/or analgesics, corticosteriod injections, viscosupplementation injections, flexibility and strengthening excercises, use of assistive devices and activity modification.  Onset of symptoms was gradual, starting 5 years ago with gradually worsening course since that time. The patient noted no past surgery on the right knee(s).  Patient currently rates pain in the right knee(s) at 9 out of 10 with activity. Patient has night pain, worsening of pain with activity and weight bearing, pain that interferes with activities of daily living, pain with passive range of motion and joint swelling.  Patient has evidence of subchondral sclerosis, periarticular osteophytes and joint space narrowing by imaging studies. This patient has had femoral reasonable conservative care. There is no active infection.  Patient Active Problem List   Diagnosis Date Noted  . Allergic rhinitis 06/12/2019  . Abnormal pelvic ultrasound 06/12/2019  . Annual physical exam 03/23/2019  . Osteoarthritis of right knee 03/23/2019  . Primary osteoarthritis of left hip 07/26/2018  . Primary osteoarthritis of left knee 03/09/2017  . Increased endometrial stripe thickness 06/13/2016  . Hyperparathyroidism, primary ([place]) 04/27/2016  . Hyperlipidemia 01/04/2015  . Healthcare maintenance 06/24/2014  . Obesity 05/07/2014  . Tobacco use disorder 04/24/2014  . Chronic meniscal tear of knee 04/17/2014  . Seasonal allergies 01/03/2013  . Osteoarthritis of right hip 09/01/2011  . Essential hypertension, benign 03/14/2011  . ANXIETY DEPRESSION 02/10/2009  . LOW BACK PAIN, CHRONIC 01/08/2008    Past Medical History:  Diagnosis Date  . Acne    but not being treated for  . Acute recurrent sinusitis 08/13/2014  . Anemia    history of--many yrs ago  . Anxiety    but doesn't take any meds for this  . Aortic atherosclerosis ([place]) 06/08/2016   noted on CT abd/pelvis  . Arthritis    Knees, hips back, shoulder  . Arthropathy, lower leg 02/10/2009   For eye care pt is going to Dr. Katy Fitch, and her Ortho surgeon is Dr. Dorna Leitz and [facility].    . Back pain 11/17/2015  . Breast mass in female 08/20/2014   IMPRESSION: Further evaluation is suggested for possible mass in the left breast.The patient will be contacted regarding the findings, and additional imaging will be scheduled.  BI-RADS CATEGORY 0: Incomplete. Need additional imaging evaluation and/or prior mammograms for comparison.   . Chronic back pain   . Complication of anesthesia    States she woke up just after parathyroid surgery while still in OR and attempted to get off the bed, remebers seeing staffs back towards her  . Depression   . Diarrhea   . Diverticulitis 06/08/2016   noted on CT abd/pelvis  . Diverticulosis 06/08/2016   noted on CT abd/pelvis  . Dyslipidemia   . Dyspnea 10/26/2017  . Estrogen deficiency 08/13/2014  . Fatigue 02/24/2016  . Fatty liver 06/08/2016   severe noted on CT abd/pelvis   . Gastric ulcer    history of--many yrs ago  . General weakness 06/08/2016  . Headache(784.0)    History of migraine  . Heart murmur   . History of bronchitis    last time 31months  . History of syncope   . Hx of migraines  last one a month ago  . Hypercalcemia 12/12/2016  . Hyperparathyroidism ([place]) 12/12/2016  . Hypertension     Dr     Higinio Plan  . Insomnia    Nyquil nightly  . Joint pain   . Joint swelling   . Lactose intolerance   . Nausea and vomiting   . Obesity   . Pneumonia 03/15/2016   left lower lobe, left lower lobe atelectasis, elevated left hemidiaphragm noted again, small left pleural  effusion  . Pre-syncope 04/12/2016  . PTSD (post-traumatic stress disorder)   . Saddle anesthesia 06/28/2018  . Sinusitis   . Urge incontinence of urine     Past Surgical History:  Procedure Laterality Date  . CATARACT EXTRACTION, BILATERAL    . CERVICAL CONE BIOPSY    . DILATION AND CURETTAGE OF UTERUS  30+yrs ago  . MOUTH SURGERY    . NASAL SEPTUM SURGERY  40+yrs ago  . PARATHYROIDECTOMY Left 01/04/2017   Procedure: LEFT SUPERIOR PARATHYROIDECTOMY;  Surgeon: Armandina Gemma, MD;  Location: [facility];  Service: General;  Laterality: Left;  . TOTAL HIP ARTHROPLASTY  09/01/2011   Procedure: TOTAL HIP ARTHROPLASTY;  Surgeon: [name], MD;  Location: [facility];  Service: Orthopedics;  Laterality: Right;  . TOTAL HIP ARTHROPLASTY Left 07/26/2018   Procedure: TOTAL HIP ARTHROPLASTY ANTERIOR APPROACH;  Surgeon: Dorna Leitz, MD;  Location: [facility];  Service: Orthopedics;  Laterality: Left;  . TOTAL KNEE ARTHROPLASTY Left 03/09/2017   Procedure: LEFT TOTAL KNEE ARTHROPLASTY;  Surgeon: Dorna Leitz, MD;  Location: [facility];  Service: Orthopedics;  Laterality: Left;  . TUBAL LIGATION  30+yrs ago    Current Facility-Administered Medications  Medication Dose Route Frequency Provider Last Rate Last Admin  . [START ON 07/18/2019] bupivacaine liposome (EXPAREL) 1.3 % injection 266 mg  20 mL Other Once Dorna Leitz, MD       Current Outpatient Medications  Medication Sig Dispense Refill Last Dose  . albuterol (VENTOLIN HFA) 108 (90 Base) MCG/ACT inhaler INHALE 2-4 PUFFS INTO THE LUNGS EVERY 4 HOURS AS NEEDED FOR WHEEZING (OR COUGH). (Patient taking differently: Inhale 2-4 puffs into the lungs every 4 (four) hours as needed for wheezing or shortness of breath. ) 18 g 1   . amLODipine (NORVASC) 10 MG tablet Take 1 tablet (10 mg total) by mouth daily. 90 tablet 3   . atorvastatin (LIPITOR) 40 MG tablet TAKE 1 TABLET BY MOUTH EVERY DAY (Patient taking differently: Take 40 mg by mouth daily. ) 90 tablet 3   . BIOTIN PO  Take 500 mcg by mouth at bedtime.      . busPIRone (BUSPAR) 10 MG tablet TAKE 1 TABLET BY MOUTH TWICE A DAY (Patient taking differently: Take 10 mg by mouth 2 (two) times daily. ) 180 tablet 3   . Cholecalciferol (VITAMIN D3) 50 MCG (2000 UT) capsule Take 2,000 Units by mouth at bedtime.      . Coenzyme Q10 (CO Q-10) 300 MG CAPS Take 300 mg by mouth at bedtime.      . cyclobenzaprine (FLEXERIL) 10 MG tablet Take 1 tablet (10 mg total) by mouth 2 (two) times daily as needed for muscle spasms. 30 tablet 1   . etodolac (LODINE) 400 MG tablet Take 400 mg by mouth 2 (two) times daily.     . fluticasone (FLONASE) 50 MCG/ACT nasal spray Place 1 spray into both nostrils daily as needed for allergies or rhinitis.     . folic acid (FOLVITE) A999333 MCG tablet Take  400 mcg by mouth at bedtime.      . gabapentin (NEURONTIN) 300 MG capsule TAKE 1 CAPSULE BY MOUTH THREE TIMES A DAY (Patient taking differently: Take 300 mg by mouth 3 (three) times daily. ) 90 capsule 2   . Krill Oil 350 MG CAPS Take 350 mg by mouth at bedtime.      Marland Kitchen lisinopril (ZESTRIL) 20 MG tablet Take 1 tablet (20 mg total) by mouth daily. 90 tablet 3   . Multiple Vitamins-Minerals (HEALTHY EYES PO) Take 1 tablet by mouth at bedtime.     Marland Kitchen OVER THE COUNTER MEDICATION Take 1 tablet by mouth daily. Neuriva Brain Support     . escitalopram (LEXAPRO) 5 MG tablet Take 1 tablet (5 mg total) by mouth daily. 90 tablet 2   . etodolac (LODINE) 200 MG capsule Take 1 capsule (200 mg total) by mouth every 12 (twelve) hours as needed for moderate pain. MAX 1,000mg  daily. (Patient not taking: Reported on 07/03/2019) 60 capsule 1 Not Taking at Unknown time   Allergies  Allergen Reactions  . Tape Rash    Social History   Tobacco Use  . Smoking status: Current Every Day Smoker    Packs/day: 0.50    Years: 15.00    Pack years: 7.50    Types: Cigarettes    Start date: 02/21/1968  . Smokeless tobacco: Never Used  . Tobacco comment: quit in the past for 9  years.  Previous 1ppd  Substance Use Topics  . Alcohol use: No    Alcohol/week: 0.0 standard drinks    Family History  Problem Relation Age of Onset  . Hypertension Mother   . Arthritis-Osteo Mother   . Irritable bowel syndrome Mother   . Cancer - Prostate Father   . High blood pressure Brother   . Diabetes Mellitus II Maternal Grandmother   . Heart attack Maternal Grandfather   . Hyperlipidemia Neg Hx   . Diabetes Neg Hx   . Sudden death Neg Hx      Review of Systems ROS ROS: I have reviewed the patient's review of systems thoroughly and there are no positive responses as relates to the HPI. Objective:  Physical Exam  Vital signs in last 24 hours:   Well-developed well-nourished patient in no acute distress. Alert and oriented x3 HEENT:within normal limits Cardiac: Regular rate and rhythm Pulmonary: Lungs clear to auscultation Abdomen: Soft and nontender.  Normal active bowel sounds  Musculoskeletal: (right knee: Painful range of motion.  Limited range of motion.  No instability.  No erythema or warmth.  Trace effusion.  Mild varus malalignment.  Neurovascular intact distally.) Labs: Recent Results (from the past 2160 hour(s))  Cytology - PAP     Status: None   Collection Time: 06/13/19 11:00 AM  Result Value Ref Range   Adequacy      Satisfactory for evaluation; transformation zone component ABSENT.   Diagnosis      - Negative for intraepithelial lesion or malignancy (NILM)  APTT     Status: None   Collection Time: 07/10/19 10:41 AM  Result Value Ref Range   aPTT 30 24 - 36 seconds    Comment: Performed at [facility], [place] [address], [place], [place] [address]  CBC WITH DIFFERENTIAL     Status: None   Collection Time: 07/10/19 10:41 AM  Result Value Ref Range   WBC 7.5 4.0 - 10.5 K/uL   RBC 4.50 3.87 - 5.11 MIL/uL   Hemoglobin 14.0 12.0 -  15.0 g/dL   HCT 44.1 36.0 - 46.0 %   MCV 98.0 80.0 - 100.0 fL   MCH 31.1 26.0 - 34.0 pg   MCHC  31.7 30.0 - 36.0 g/dL   RDW 12.8 11.5 - 15.5 %   Platelets 219 150 - 400 K/uL   nRBC 0.0 0.0 - 0.2 %   Neutrophils Relative % 48 %   Neutro Abs 3.5 1.7 - 7.7 K/uL   Lymphocytes Relative 41 %   Lymphs Abs 3.1 0.7 - 4.0 K/uL   Monocytes Relative 9 %   Monocytes Absolute 0.7 0.1 - 1.0 K/uL   Eosinophils Relative 1 %   Eosinophils Absolute 0.1 0.0 - 0.5 K/uL   Basophils Relative 1 %   Basophils Absolute 0.1 0.0 - 0.1 K/uL   Immature Granulocytes 0 %   Abs Immature Granulocytes 0.03 0.00 - 0.07 K/uL    Comment: Performed at [facility], [place] [address] [place], [place] [address]  Comprehensive metabolic panel     Status: Abnormal   Collection Time: 07/10/19 10:41 AM  Result Value Ref Range   Sodium 141 135 - 145 mmol/L   Potassium 4.4 3.5 - 5.1 mmol/L   Chloride 106 98 - 111 mmol/L   CO2 26 22 - 32 mmol/L   Glucose, Bld 134 (H) 70 - 99 mg/dL    Comment: Glucose reference range applies only to samples taken after fasting for at least 8 hours.   BUN 28 (H) 8 - 23 mg/dL   Creatinine, Ser 1.00 0.44 - 1.00 mg/dL   Calcium 10.0 8.9 - 10.3 mg/dL   Total Protein 7.3 6.5 - 8.1 g/dL   Albumin 4.5 3.5 - 5.0 g/dL   AST 19 15 - 41 U/L   ALT 18 0 - 44 U/L   Alkaline Phosphatase 79 38 - 126 U/L   Total Bilirubin 0.5 0.3 - 1.2 mg/dL   GFR calc non Af Amer 56 (L) >60 mL/min   GFR calc Af Amer >60 >60 mL/min   Anion gap 9 5 - 15    Comment: Performed at [facility], [place] [address], [place], [place] [address]  Protime-INR     Status: None   Collection Time: 07/10/19 10:41 AM  Result Value Ref Range   Prothrombin Time 12.5 11.4 - 15.2 seconds   INR 1.0 0.8 - 1.2    Comment: (NOTE) INR goal varies based on device and disease states. Performed at [facility], [place] [address], [place], [place] [address]   Type and screen Order type and screen if day of surgery is less than 15 days from draw of preadmission visit or order morning of  surgery if day of surgery is greater than 6 days from preadmission visit.     Status: None   Collection Time: 07/10/19 10:41 AM  Result Value Ref Range   ABO/RH(D) O POS    Antibody Screen NEG    Sample Expiration 07/24/2019,2359    Extend sample reason      NO TRANSFUSIONS OR PREGNANCY IN THE PAST 3 MONTHS Performed at [facility], [place] [address] [place], [place] [address]   Urinalysis, Routine w reflex microscopic     Status: Abnormal   Collection Time: 07/10/19 10:41 AM  Result Value Ref Range   Color, Urine STRAW (A) YELLOW   APPearance CLEAR CLEAR   Specific Gravity, Urine 1.004 (L) 1.005 - 1.030   pH 6.0 5.0 - 8.0  Glucose, UA 50 (A) NEGATIVE mg/dL   Hgb urine dipstick NEGATIVE NEGATIVE   Bilirubin Urine NEGATIVE NEGATIVE   Ketones, ur NEGATIVE NEGATIVE mg/dL   Protein, ur NEGATIVE NEGATIVE mg/dL   Nitrite NEGATIVE NEGATIVE   Leukocytes,Ua NEGATIVE NEGATIVE    Comment: Performed at [facility], [place] [address] [place], [place] [address]  Surgical pcr screen     Status: None   Collection Time: 07/10/19 10:41 AM   Specimen: Nasal Mucosa; Nasal Swab  Result Value Ref Range   MRSA, PCR NEGATIVE NEGATIVE   Staphylococcus aureus NEGATIVE NEGATIVE    Comment: (NOTE) The Xpert SA Assay (FDA approved for NASAL specimens in patients 56 years of age and older), is one component of a comprehensive surveillance program. It is not intended to diagnose infection nor to guide or monitor treatment. Performed at [facility], [place] [address], [place], [place] [address]   SARS CORONAVIRUS 2 (TAT 6-24 HRS) Nasopharyngeal Nasopharyngeal Swab     Status: None   Collection Time: 07/15/19  9:50 AM   Specimen: Nasopharyngeal Swab  Result Value Ref Range   SARS Coronavirus 2 NEGATIVE NEGATIVE    Comment: (NOTE) SARS-CoV-2 target nucleic acids are NOT DETECTED. The SARS-CoV-2 RNA is generally detectable in upper and  lower respiratory specimens during the acute phase of infection. Negative results do not preclude SARS-CoV-2 infection, do not rule out co-infections with other pathogens, and should not be used as the sole basis for treatment or other patient management decisions. Negative results must be combined with clinical observations, patient history, and epidemiological information. The expected result is Negative. Fact Sheet for Patients: SugarRoll.be Fact Sheet for Healthcare Providers: https://www.woods-mathews.com/ This test is not yet approved or cleared by the [place] FDA and  has been authorized for detection and/or diagnosis of SARS-CoV-2 by FDA under an Emergency Use Authorization (EUA). This EUA will remain  in effect (meaning this test can be used) for the duration of the COVID-19 declaration under Section 56 4(b)(1) of the Act, 21 U.S.C. section 360bbb-3(b)(1), unless the authorization is terminated or revoked sooner. Performed at [facility], [place] [address] [place], [place] [address]     Estimated body mass index is 35.71 kg/m as calculated from the following:   Height as of 07/10/19: 5\' 5"  (1.651 m).   Weight as of 07/10/19: 97.3 kg.   Imaging Review Plain radiographs demonstrate severe degenerative joint disease of the right knee(s). The overall alignment ismild varus. The bone quality appears to be fair for age and reported activity level.      Assessment/Plan:  End stage arthritis, right knee   The patient history, physical examination, clinical judgment of the provider and imaging studies are consistent with end stage degenerative joint disease of the right knee(s) and total knee arthroplasty is deemed medically necessary. The treatment options including medical management, injection therapy arthroscopy and arthroplasty were discussed at length. The risks and benefits of total knee arthroplasty were presented and  reviewed. The risks due to aseptic loosening, infection, stiffness, patella tracking problems, thromboembolic complications and other imponderables were discussed. The patient acknowledged the explanation, agreed to proceed with the plan and consent was signed. Patient is being admitted for inpatient treatment for surgery, pain control, PT, OT, prophylactic antibiotics, VTE prophylaxis, progressive ambulation and ADL's and discharge planning. The patient is planning to be discharged home with home health services     Patient's anticipated LOS is less than 2 midnights, meeting these requirements: -  Younger than 59 - Lives within 1 hour of care - Has a competent adult at home to recover with post-op recover - NO history of  - Chronic pain requiring opiods  - Diabetes  - Coronary Artery Disease  - Heart failure  - Heart attack  - Stroke  - DVT/VTE  - Cardiac arrhythmia  - Respiratory Failure/COPD  - Renal failure  - Anemia  - Advanced Liver disease

## 2019-07-17 NOTE — Progress Notes (Signed)
Called Pt about time change for surgery on 07/18/19

## 2019-07-18 ENCOUNTER — Encounter (HOSPITAL_COMMUNITY)
Admission: RE | Disposition: A | Discharge status: Home Health | Payer: Self-pay | Source: Other Acute Inpatient Hospital | Attending: Orthopedic Surgery

## 2019-07-18 ENCOUNTER — Ambulatory Visit (HOSPITAL_COMMUNITY): Payer: Medicare Other | Admitting: Certified Registered"

## 2019-07-18 ENCOUNTER — Ambulatory Visit (HOSPITAL_COMMUNITY): Payer: Medicare Other | Admitting: Physician Assistant

## 2019-07-18 ENCOUNTER — Encounter (HOSPITAL_COMMUNITY): Payer: Self-pay | Admitting: Orthopedic Surgery

## 2019-07-18 ENCOUNTER — Observation Stay (HOSPITAL_COMMUNITY)
Admission: RE | Admit: 2019-07-18 | Discharge: 2019-07-19 | Disposition: A | Discharge status: Home Health | Payer: Medicare Other | Source: Other Acute Inpatient Hospital | Attending: Orthopedic Surgery | Admitting: Orthopedic Surgery

## 2019-07-18 DIAGNOSIS — I10 Essential (primary) hypertension: Secondary | ICD-10-CM | POA: Diagnosis not present

## 2019-07-18 DIAGNOSIS — Z79899 Other long term (current) drug therapy: Secondary | ICD-10-CM | POA: Diagnosis not present

## 2019-07-18 DIAGNOSIS — F329 Major depressive disorder, single episode, unspecified: Secondary | ICD-10-CM | POA: Insufficient documentation

## 2019-07-18 DIAGNOSIS — K760 Fatty (change of) liver, not elsewhere classified: Secondary | ICD-10-CM | POA: Diagnosis not present

## 2019-07-18 DIAGNOSIS — M1711 Unilateral primary osteoarthritis, right knee: Principal | ICD-10-CM | POA: Diagnosis present

## 2019-07-18 DIAGNOSIS — Z6835 Body mass index (BMI) 35.0-35.9, adult: Secondary | ICD-10-CM | POA: Diagnosis not present

## 2019-07-18 DIAGNOSIS — E213 Hyperparathyroidism, unspecified: Secondary | ICD-10-CM | POA: Insufficient documentation

## 2019-07-18 DIAGNOSIS — E785 Hyperlipidemia, unspecified: Secondary | ICD-10-CM | POA: Insufficient documentation

## 2019-07-18 DIAGNOSIS — F4310 Post-traumatic stress disorder, unspecified: Secondary | ICD-10-CM | POA: Insufficient documentation

## 2019-07-18 DIAGNOSIS — G4700 Insomnia, unspecified: Secondary | ICD-10-CM | POA: Diagnosis not present

## 2019-07-18 DIAGNOSIS — G8918 Other acute postprocedural pain: Secondary | ICD-10-CM | POA: Diagnosis not present

## 2019-07-18 DIAGNOSIS — E669 Obesity, unspecified: Secondary | ICD-10-CM | POA: Insufficient documentation

## 2019-07-18 HISTORY — PX: TOTAL KNEE ARTHROPLASTY: SHX125

## 2019-07-18 LAB — TYPE AND SCREEN
ABO/RH(D): O POS
Antibody Screen: NEGATIVE

## 2019-07-18 SURGERY — ARTHROPLASTY, KNEE, TOTAL
Anesthesia: Spinal | Site: Knee | Laterality: Right

## 2019-07-18 MED ORDER — BUPIVACAINE IN DEXTROSE 0.75-8.25 % IT SOLN
INTRATHECAL | Status: DC | PRN
  Administered 2019-07-18: 1.8 mL via INTRATHECAL

## 2019-07-18 MED ORDER — MIDAZOLAM HCL 2 MG/2ML IJ SOLN
1.0000 mg | INTRAMUSCULAR | Status: DC
  Administered 2019-07-18: 1 mg via INTRAVENOUS
  Filled 2019-07-18: qty 2

## 2019-07-18 MED ORDER — CEFAZOLIN SODIUM-DEXTROSE 2-4 GM/100ML-% IV SOLN
2.0000 g | INTRAVENOUS | Status: AC
  Administered 2019-07-18: 2 g via INTRAVENOUS
  Filled 2019-07-18: qty 100

## 2019-07-18 MED ORDER — ONDANSETRON HCL 4 MG PO TABS: 4.0000 mg | ORAL_TABLET | Freq: Four times a day (QID) | ORAL | Status: DC | PRN

## 2019-07-18 MED ORDER — ONDANSETRON HCL 4 MG/2ML IJ SOLN: 4.0000 mg | Freq: Four times a day (QID) | INTRAMUSCULAR | Status: DC | PRN

## 2019-07-18 MED ORDER — POVIDONE-IODINE 10 % EX SWAB
2.0000 "application " | Freq: Once | CUTANEOUS | Status: AC
  Administered 2019-07-18: 2 via TOPICAL

## 2019-07-18 MED ORDER — SODIUM CHLORIDE (PF) 0.9 % IJ SOLN
INTRAMUSCULAR | Status: AC
  Filled 2019-07-18: qty 50

## 2019-07-18 MED ORDER — OXYCODONE HCL 5 MG PO TABS: 5.0000 mg | ORAL_TABLET | Freq: Once | ORAL | Status: DC | PRN

## 2019-07-18 MED ORDER — BISACODYL 5 MG PO TBEC: 5.0000 mg | DELAYED_RELEASE_TABLET | Freq: Every day | ORAL | Status: DC | PRN

## 2019-07-18 MED ORDER — PROPOFOL 10 MG/ML IV BOLUS
INTRAVENOUS | Status: DC | PRN
  Administered 2019-07-18 (×2): 20 mg via INTRAVENOUS

## 2019-07-18 MED ORDER — SODIUM CHLORIDE 0.9 % IR SOLN
Status: DC | PRN
  Administered 2019-07-18: 1000 mL

## 2019-07-18 MED ORDER — ACETAMINOPHEN 325 MG PO TABS: 325.0000 mg | ORAL_TABLET | Freq: Four times a day (QID) | ORAL | Status: DC | PRN

## 2019-07-18 MED ORDER — ONDANSETRON HCL 4 MG/2ML IJ SOLN
INTRAMUSCULAR | Status: AC
  Filled 2019-07-18: qty 2

## 2019-07-18 MED ORDER — BUPIVACAINE-EPINEPHRINE 0.5% -1:200000 IJ SOLN
INTRAMUSCULAR | Status: DC | PRN
  Administered 2019-07-18: 30 mL

## 2019-07-18 MED ORDER — HYDROMORPHONE HCL 1 MG/ML IJ SOLN
0.5000 mg | INTRAMUSCULAR | Status: DC | PRN
  Administered 2019-07-18 (×2): 1 mg via INTRAVENOUS
  Filled 2019-07-18 (×2): qty 1

## 2019-07-18 MED ORDER — ONDANSETRON HCL 4 MG/2ML IJ SOLN: 4.0000 mg | Freq: Once | INTRAMUSCULAR | Status: DC | PRN

## 2019-07-18 MED ORDER — DOCUSATE SODIUM 100 MG PO CAPS
100.0000 mg | ORAL_CAPSULE | Freq: Two times a day (BID) | ORAL | 0 refills | Status: DC
Start: 2019-07-18 — End: 2022-12-25

## 2019-07-18 MED ORDER — CELECOXIB 200 MG PO CAPS
200.0000 mg | ORAL_CAPSULE | Freq: Two times a day (BID) | ORAL | Status: DC
  Administered 2019-07-18 – 2019-07-19 (×2): 200 mg via ORAL
  Filled 2019-07-18 (×2): qty 1

## 2019-07-18 MED ORDER — DEXAMETHASONE SODIUM PHOSPHATE 10 MG/ML IJ SOLN
INTRAMUSCULAR | Status: AC
  Filled 2019-07-18: qty 1

## 2019-07-18 MED ORDER — GABAPENTIN 300 MG PO CAPS: 300.0000 mg | ORAL_CAPSULE | Freq: Two times a day (BID) | ORAL | Status: DC

## 2019-07-18 MED ORDER — ESCITALOPRAM OXALATE 10 MG PO TABS
5.0000 mg | ORAL_TABLET | Freq: Every day | ORAL | Status: DC
  Administered 2019-07-19: 5 mg via ORAL
  Filled 2019-07-18: qty 1

## 2019-07-18 MED ORDER — TRANEXAMIC ACID-NACL 1000-0.7 MG/100ML-% IV SOLN
1000.0000 mg | Freq: Once | INTRAVENOUS | Status: AC
  Administered 2019-07-18: 1000 mg via INTRAVENOUS
  Filled 2019-07-18: qty 100

## 2019-07-18 MED ORDER — SODIUM CHLORIDE 0.9 % IV SOLN: INTRAVENOUS | Status: DC

## 2019-07-18 MED ORDER — MAGNESIUM CITRATE PO SOLN: 1.0000 | Freq: Once | ORAL | Status: DC | PRN

## 2019-07-18 MED ORDER — OXYCODONE-ACETAMINOPHEN 5-325 MG PO TABS: 1.0000 | ORAL_TABLET | Freq: Four times a day (QID) | ORAL | 0 refills | Status: DC | PRN

## 2019-07-18 MED ORDER — ALBUTEROL SULFATE (2.5 MG/3ML) 0.083% IN NEBU: 2.5000 mg | INHALATION_SOLUTION | RESPIRATORY_TRACT | Status: DC | PRN

## 2019-07-18 MED ORDER — POLYETHYLENE GLYCOL 3350 17 G PO PACK: 17.0000 g | PACK | Freq: Every day | ORAL | Status: DC | PRN

## 2019-07-18 MED ORDER — FENTANYL CITRATE (PF) 100 MCG/2ML IJ SOLN: 25.0000 ug | INTRAMUSCULAR | Status: DC | PRN

## 2019-07-18 MED ORDER — LACTATED RINGERS IV SOLN: INTRAVENOUS | Status: DC

## 2019-07-18 MED ORDER — CEFAZOLIN SODIUM-DEXTROSE 2-4 GM/100ML-% IV SOLN
2.0000 g | Freq: Four times a day (QID) | INTRAVENOUS | Status: AC
  Administered 2019-07-18 – 2019-07-19 (×2): 2 g via INTRAVENOUS
  Filled 2019-07-18 (×2): qty 100

## 2019-07-18 MED ORDER — SODIUM CHLORIDE 0.9% FLUSH
INTRAVENOUS | Status: DC | PRN
  Administered 2019-07-18: 50 mL

## 2019-07-18 MED ORDER — OXYCODONE HCL 5 MG PO TABS
5.0000 mg | ORAL_TABLET | ORAL | Status: DC | PRN
  Administered 2019-07-18: 5 mg via ORAL
  Administered 2019-07-19 (×2): 10 mg via ORAL
  Filled 2019-07-18 (×3): qty 2

## 2019-07-18 MED ORDER — WATER FOR IRRIGATION, STERILE IR SOLN
Status: DC | PRN
  Administered 2019-07-18 (×2): 1000 mL

## 2019-07-18 MED ORDER — ALUM & MAG HYDROXIDE-SIMETH 200-200-20 MG/5ML PO SUSP: 30.0000 mL | ORAL | Status: DC | PRN

## 2019-07-18 MED ORDER — DIPHENHYDRAMINE HCL 12.5 MG/5ML PO ELIX: 12.5000 mg | ORAL_SOLUTION | ORAL | Status: DC | PRN

## 2019-07-18 MED ORDER — BUPIVACAINE LIPOSOME 1.3 % IJ SUSP
INTRAMUSCULAR | Status: DC | PRN
  Administered 2019-07-18: 20 mL

## 2019-07-18 MED ORDER — TRANEXAMIC ACID-NACL 1000-0.7 MG/100ML-% IV SOLN
1000.0000 mg | INTRAVENOUS | Status: AC
  Administered 2019-07-18: 1000 mg via INTRAVENOUS
  Filled 2019-07-18: qty 100

## 2019-07-18 MED ORDER — PROPOFOL 1000 MG/100ML IV EMUL
INTRAVENOUS | Status: AC
  Filled 2019-07-18: qty 100

## 2019-07-18 MED ORDER — ATORVASTATIN CALCIUM 40 MG PO TABS
40.0000 mg | ORAL_TABLET | Freq: Every day | ORAL | Status: DC
  Administered 2019-07-18 – 2019-07-19 (×2): 40 mg via ORAL
  Filled 2019-07-18 (×2): qty 1

## 2019-07-18 MED ORDER — BUSPIRONE HCL 5 MG PO TABS
10.0000 mg | ORAL_TABLET | Freq: Two times a day (BID) | ORAL | Status: DC
  Administered 2019-07-18 – 2019-07-19 (×2): 10 mg via ORAL
  Filled 2019-07-18 (×2): qty 2

## 2019-07-18 MED ORDER — METHOCARBAMOL 500 MG PO TABS
500.0000 mg | ORAL_TABLET | Freq: Four times a day (QID) | ORAL | Status: DC | PRN
  Administered 2019-07-18 – 2019-07-19 (×2): 500 mg via ORAL
  Filled 2019-07-18 (×2): qty 1

## 2019-07-18 MED ORDER — AMLODIPINE BESYLATE 10 MG PO TABS
10.0000 mg | ORAL_TABLET | Freq: Every day | ORAL | Status: DC
  Administered 2019-07-19: 10 mg via ORAL
  Filled 2019-07-18: qty 1

## 2019-07-18 MED ORDER — FENTANYL CITRATE (PF) 100 MCG/2ML IJ SOLN
50.0000 ug | INTRAMUSCULAR | Status: DC
  Administered 2019-07-18: 50 ug via INTRAVENOUS
  Filled 2019-07-18: qty 2

## 2019-07-18 MED ORDER — DOCUSATE SODIUM 100 MG PO CAPS
100.0000 mg | ORAL_CAPSULE | Freq: Two times a day (BID) | ORAL | Status: DC
  Administered 2019-07-18 – 2019-07-19 (×2): 100 mg via ORAL
  Filled 2019-07-18 (×2): qty 1

## 2019-07-18 MED ORDER — ASPIRIN EC 325 MG PO TBEC: 325.0000 mg | DELAYED_RELEASE_TABLET | Freq: Two times a day (BID) | ORAL | 0 refills | Status: AC

## 2019-07-18 MED ORDER — BUPIVACAINE-EPINEPHRINE (PF) 0.25% -1:200000 IJ SOLN
INTRAMUSCULAR | Status: AC
  Filled 2019-07-18: qty 30

## 2019-07-18 MED ORDER — 0.9 % SODIUM CHLORIDE (POUR BTL) OPTIME
TOPICAL | Status: DC | PRN
  Administered 2019-07-18: 1000 mL

## 2019-07-18 MED ORDER — ASPIRIN EC 325 MG PO TBEC
325.0000 mg | DELAYED_RELEASE_TABLET | Freq: Two times a day (BID) | ORAL | Status: DC
  Administered 2019-07-19: 325 mg via ORAL
  Filled 2019-07-18: qty 1

## 2019-07-18 MED ORDER — DEXAMETHASONE SODIUM PHOSPHATE 10 MG/ML IJ SOLN
10.0000 mg | Freq: Two times a day (BID) | INTRAMUSCULAR | Status: DC
  Administered 2019-07-18 – 2019-07-19 (×2): 10 mg via INTRAVENOUS
  Filled 2019-07-18 (×2): qty 1

## 2019-07-18 MED ORDER — LISINOPRIL 20 MG PO TABS
20.0000 mg | ORAL_TABLET | Freq: Every day | ORAL | Status: DC
  Administered 2019-07-18 – 2019-07-19 (×2): 20 mg via ORAL
  Filled 2019-07-18 (×2): qty 1

## 2019-07-18 MED ORDER — PROPOFOL 500 MG/50ML IV EMUL
INTRAVENOUS | Status: DC | PRN
  Administered 2019-07-18: 125 ug/kg/min via INTRAVENOUS

## 2019-07-18 MED ORDER — GABAPENTIN 300 MG PO CAPS
300.0000 mg | ORAL_CAPSULE | Freq: Three times a day (TID) | ORAL | Status: DC
  Administered 2019-07-18 – 2019-07-19 (×3): 300 mg via ORAL
  Filled 2019-07-18 (×3): qty 1

## 2019-07-18 MED ORDER — ROPIVACAINE HCL 5 MG/ML IJ SOLN
INTRAMUSCULAR | Status: DC | PRN
  Administered 2019-07-18: 30 mL via PERINEURAL

## 2019-07-18 MED ORDER — OXYCODONE HCL 5 MG/5ML PO SOLN: 5.0000 mg | Freq: Once | ORAL | Status: DC | PRN

## 2019-07-18 MED ORDER — PROPOFOL 10 MG/ML IV BOLUS
INTRAVENOUS | Status: AC
  Filled 2019-07-18: qty 20

## 2019-07-18 MED ORDER — TIZANIDINE HCL 2 MG PO TABS
2.0000 mg | ORAL_TABLET | Freq: Three times a day (TID) | ORAL | 0 refills | Status: DC | PRN
Start: 2019-07-18 — End: 2022-12-25

## 2019-07-18 MED ORDER — METHOCARBAMOL 1000 MG/10ML IJ SOLN
500.0000 mg | Freq: Four times a day (QID) | INTRAVENOUS | Status: DC | PRN
  Filled 2019-07-18: qty 5

## 2019-07-18 MED ORDER — DEXAMETHASONE SODIUM PHOSPHATE 10 MG/ML IJ SOLN
INTRAMUSCULAR | Status: DC | PRN
  Administered 2019-07-18: 8 mg via INTRAVENOUS

## 2019-07-18 MED ORDER — ONDANSETRON HCL 4 MG/2ML IJ SOLN
INTRAMUSCULAR | Status: DC | PRN
  Administered 2019-07-18: 4 mg via INTRAVENOUS

## 2019-07-18 SURGICAL SUPPLY — 55 items
ATTUNE MED DOME PAT 38 KNEE (Knees) ×2 IMPLANT
ATTUNE MED DOME PAT 38MM KNEE (Knees) ×1 IMPLANT
ATTUNE PS FEM RT SZ 4 CEM KNEE (Femur) ×3 IMPLANT
ATTUNE PSRP INSR SZ4 8 KNEE (Insert) ×2 IMPLANT
ATTUNE PSRP INSR SZ4 8MM KNEE (Insert) ×1 IMPLANT
BAG ZIPLOCK 12X15 (MISCELLANEOUS) ×3 IMPLANT
BASE TIBIAL ROT PLAT SZ 5 KNEE (Knees) ×1 IMPLANT
BENZOIN TINCTURE PRP APPL 2/3 (GAUZE/BANDAGES/DRESSINGS) ×3 IMPLANT
BLADE SAGITTAL 25.0X1.19X90 (BLADE) ×2 IMPLANT
BLADE SAGITTAL 25.0X1.19X90MM (BLADE) ×1
BLADE SAW SGTL 13.0X1.19X90.0M (BLADE) ×3 IMPLANT
BLADE SURG SZ10 CARB STEEL (BLADE) ×6 IMPLANT
BNDG ELASTIC 6X5.8 VLCR STR LF (GAUZE/BANDAGES/DRESSINGS) ×3 IMPLANT
BOOTIES KNEE HIGH SLOAN (MISCELLANEOUS) ×3 IMPLANT
BOWL SMART MIX CTS (DISPOSABLE) ×3 IMPLANT
CEMENT HV SMART SET (Cement) ×6 IMPLANT
CLOSURE WOUND 1/2 X4 (GAUZE/BANDAGES/DRESSINGS)
COVER SURGICAL LIGHT HANDLE (MISCELLANEOUS) ×3 IMPLANT
COVER WAND RF STERILE (DRAPES) IMPLANT
CUFF TOURN SGL QUICK 34 (TOURNIQUET CUFF) ×3
CUFF TRNQT CYL 34X4.125X (TOURNIQUET CUFF) ×1 IMPLANT
DECANTER SPIKE VIAL GLASS SM (MISCELLANEOUS) ×6 IMPLANT
DRAPE U-SHAPE 47X51 STRL (DRAPES) ×3 IMPLANT
DRSG AQUACEL AG ADV 3.5X10 (GAUZE/BANDAGES/DRESSINGS) ×3 IMPLANT
DURAPREP 26ML APPLICATOR (WOUND CARE) ×3 IMPLANT
ELECT REM PT RETURN 15FT ADLT (MISCELLANEOUS) ×3 IMPLANT
GLOVE BIOGEL PI IND STRL 8 (GLOVE) ×2 IMPLANT
GLOVE BIOGEL PI INDICATOR 8 (GLOVE) ×4
GLOVE ECLIPSE 7.5 STRL STRAW (GLOVE) ×6 IMPLANT
GOWN STRL REUS W/TWL XL LVL3 (GOWN DISPOSABLE) ×6 IMPLANT
HANDPIECE INTERPULSE COAX TIP (DISPOSABLE) ×3
HOLDER FOLEY CATH W/STRAP (MISCELLANEOUS) IMPLANT
HOOD PEEL AWAY FLYTE STAYCOOL (MISCELLANEOUS) ×9 IMPLANT
KIT TURNOVER KIT A (KITS) IMPLANT
MANIFOLD NEPTUNE II (INSTRUMENTS) ×3 IMPLANT
NEEDLE HYPO 22GX1.5 SAFETY (NEEDLE) ×3 IMPLANT
NS IRRIG 1000ML POUR BTL (IV SOLUTION) ×3 IMPLANT
PACK ICE MAXI GEL EZY WRAP (MISCELLANEOUS) ×3 IMPLANT
PACK TOTAL KNEE CUSTOM (KITS) ×3 IMPLANT
PADDING CAST COTTON 6X4 STRL (CAST SUPPLIES) ×3 IMPLANT
PENCIL SMOKE EVACUATOR (MISCELLANEOUS) IMPLANT
PIN DRILL FIX HALF THREAD (BIT) ×3 IMPLANT
PIN STEINMAN FIXATION KNEE (PIN) ×3 IMPLANT
PROTECTOR NERVE ULNAR (MISCELLANEOUS) ×3 IMPLANT
SET HNDPC FAN SPRY TIP SCT (DISPOSABLE) ×1 IMPLANT
STRIP CLOSURE SKIN 1/2X4 (GAUZE/BANDAGES/DRESSINGS) IMPLANT
SUT MNCRL AB 3-0 PS2 18 (SUTURE) ×3 IMPLANT
SUT VIC AB 0 CT1 36 (SUTURE) ×3 IMPLANT
SUT VIC AB 1 CT1 36 (SUTURE) ×6 IMPLANT
SYR CONTROL 10ML LL (SYRINGE) ×6 IMPLANT
TIBIAL BASE ROT PLAT SZ 5 KNEE (Knees) ×3 IMPLANT
TRAY FOLEY MTR SLVR 16FR STAT (SET/KITS/TRAYS/PACK) ×3 IMPLANT
WATER STERILE IRR 1000ML POUR (IV SOLUTION) ×6 IMPLANT
WRAP KNEE MAXI GEL POST OP (GAUZE/BANDAGES/DRESSINGS) ×3 IMPLANT
YANKAUER SUCT BULB TIP 10FT TU (MISCELLANEOUS) ×3 IMPLANT

## 2019-07-18 NOTE — Anesthesia Procedure Notes (Signed)
Procedure Name: MAC Date/Time: 07/18/2019 12:16 PM Performed by: Niel Hummer, CRNA Pre-anesthesia Checklist: Patient identified, Emergency Drugs available, Suction available and Patient being monitored Oxygen Delivery Method: Simple face mask

## 2019-07-18 NOTE — Discharge Instructions (Signed)

## 2019-07-18 NOTE — Anesthesia Procedure Notes (Signed)
Spinal  Patient location during procedure: OR Staffing Performed: anesthesiologist  Anesthesiologist: Mykaela Arena E, MD Preanesthetic Checklist Completed: patient identified, IV checked, risks and benefits discussed, surgical consent, monitors and equipment checked, pre-op evaluation and timeout performed Spinal Block Patient position: sitting Prep: DuraPrep and site prepped and draped Patient monitoring: continuous pulse ox, blood pressure and heart rate Approach: midline Location: L3-4 Injection technique: single-shot Needle Needle type: Pencan  Needle gauge: 24 G Needle length: 9 cm Additional Notes Functioning IV was confirmed and monitors were applied. Sterile prep and drape, including hand hygiene and sterile gloves were used. The patient was positioned and the spine was prepped. The skin was anesthetized with lidocaine.  Free flow of clear CSF was obtained prior to injecting local anesthetic into the CSF. The needle was carefully withdrawn. The patient tolerated the procedure well.      

## 2019-07-18 NOTE — Evaluation (Signed)
Physical Therapy Evaluation Patient Details Name: Alexis Cole MRN: [id number] DOB: [birth year] Today's Date: 07/18/2019   History of Present Illness  Pt is a 74 y/o female s/p R TKA. PMH includes HTN, bilateral THA, and L TKA.   Clinical Impression  Pt is s/p surgery above with deficits below. Pt requiring min guard A for mobility using RW. Overall, tolerated gait training well. Educated about knee precautions. Pt reports friend will be staying with her one night. Will continue to follow acutely to maximize functional mobility independence and safety.     Follow Up Recommendations Follow surgeon's recommendation for DC plan and follow-up therapies;Supervision for mobility/OOB    Equipment Recommendations  None recommended by PT    Recommendations for Other Services       Precautions / Restrictions Precautions Precautions: Knee Precaution Booklet Issued: No Precaution Comments: Verbally reviewed knee precautions.  Restrictions Weight Bearing Restrictions: Yes RLE Weight Bearing: Weight bearing as tolerated      Mobility  Bed Mobility Overal bed mobility: Needs Assistance Bed Mobility: Supine to Sit     Supine to sit: Supervision     General bed mobility comments: Supervision for safety.   Transfers Overall transfer level: Needs assistance Equipment used: Rolling walker (2 wheeled) Transfers: Sit to/from Stand Sit to Stand: Min guard         General transfer comment: Min guard for safety. Demonstrated safe hand placement. Used momentum to stand.   Ambulation/Gait Ambulation/Gait assistance: Min guard Gait Distance (Feet): 100 Feet Assistive device: Rolling walker (2 wheeled) Gait Pattern/deviations: Step-through pattern;Step-to pattern;Decreased stance time - right;Decreased stance time - left;Decreased weight shift to right;Antalgic Gait velocity: Decreased   General Gait Details: Mildly antalgic gait, however, overall tolerated well. Progressed to step  through gait pattern. Demonstrated safe use of RW.   Stairs            Wheelchair Mobility    Modified Rankin (Stroke Patients Only)       Balance Overall balance assessment: Needs assistance Sitting-balance support: No upper extremity supported;Feet supported Sitting balance-Leahy Scale: Good     Standing balance support: Bilateral upper extremity supported;During functional activity Standing balance-Leahy Scale: Poor Standing balance comment: Reliant on BUE support.                              Pertinent Vitals/Pain Pain Assessment: Faces Faces Pain Scale: Hurts little more Pain Location: R knee  Pain Descriptors / Indicators: Aching;Operative site guarding Pain Intervention(s): Limited activity within patient's tolerance;Monitored during session;Repositioned    Home Living Family/patient expects to be discharged to:: Private residence Living Arrangements: Alone Available Help at Discharge: Friend(s) Type of Home: House Home Access: Stairs to enter Entrance Stairs-Rails: None Entrance Stairs-Number of Steps: 1 into breeze way Home Layout: One level Home Equipment: Environmental consultant - 2 wheels;Bedside commode      Prior Function Level of Independence: Independent               Hand Dominance        Extremity/Trunk Assessment   Upper Extremity Assessment Upper Extremity Assessment: Overall WFL for tasks assessed    Lower Extremity Assessment Lower Extremity Assessment: RLE deficits/detail RLE Deficits / Details: Deficits consistent with post op pain and weakness.        Communication   Communication: No difficulties  Cognition Arousal/Alertness: Awake/alert Behavior During Therapy: WFL for tasks assessed/performed Overall Cognitive Status: Within Functional Limits for tasks assessed  General Comments      Exercises General Exercises - Lower Extremity Ankle Circles/Pumps:  AROM;Both;10 reps;Supine   Assessment/Plan    PT Assessment Patient needs continued PT services  PT Problem List Decreased strength;Decreased balance;Decreased mobility;Pain       PT Treatment Interventions DME instruction;Gait training;Functional mobility training;Stair training;Therapeutic activities;Balance training;Therapeutic exercise;Patient/family education    PT Goals (Current goals can be found in the Care Plan section)  Acute Rehab PT Goals Patient Stated Goal: to go home PT Goal Formulation: With patient Time For Goal Achievement: 08/01/19 Potential to Achieve Goals: Good    Frequency 7X/week   Barriers to discharge        Co-evaluation               AM-PAC PT "6 Clicks" Mobility  Outcome Measure Help needed turning from your back to your side while in a flat bed without using bedrails?: None Help needed moving from lying on your back to sitting on the side of a flat bed without using bedrails?: None Help needed moving to and from a bed to a chair (including a wheelchair)?: A Little Help needed standing up from a chair using your arms (e.g., wheelchair or bedside chair)?: A Little Help needed to walk in hospital room?: A Little Help needed climbing 3-5 steps with a railing? : A Lot 6 Click Score: 19    End of Session Equipment Utilized During Treatment: Gait belt Activity Tolerance: Patient tolerated treatment well Patient left: in chair;with call bell/phone within reach;with chair alarm set;with nursing/sitter in room Nurse Communication: Mobility status PT Visit Diagnosis: Other abnormalities of gait and mobility (R26.89);Pain Pain - Right/Left: Right Pain - part of body: Knee    Time: [id number] PT Time Calculation (min) (ACUTE ONLY): 20 min   Charges:   PT Evaluation $PT Eval Low Complexity: 1 Low          Lou Miner, DPT  Acute Rehabilitation Services  Pager: [phone] Office: [phone]   [name] 07/18/2019, 5:49 PM

## 2019-07-18 NOTE — Progress Notes (Signed)
AssistedDr. Carolyn Witman with right, ultrasound guided, adductor canal block. Side rails up, monitors on throughout procedure. See vital signs in flow sheet. Tolerated Procedure well.  

## 2019-07-18 NOTE — Anesthesia Procedure Notes (Signed)
Anesthesia Regional Block: Adductor canal block   Pre-Anesthetic Checklist: ,, timeout performed, Correct Patient, Correct Site, Correct Laterality, Correct Procedure, Correct Position, site marked, Risks and benefits discussed,  Surgical consent,  Pre-op evaluation,  At surgeon's request and post-op pain management  Laterality: Right  Prep: chloraprep       Needles:  Injection technique: Single-shot  Needle Type: Echogenic Stimulator Needle     Needle Length: 10cm  Needle Gauge: 20     Additional Needles:   Procedures:,,,, ultrasound used (permanent image in chart),,,,  Narrative:  Start time: 07/18/2019 11:00 AM End time: 07/18/2019 11:03 AM Injection made incrementally with aspirations every 5 mL.  Performed by: Personally  Anesthesiologist: Lidia Collum, MD  Additional Notes: Monitors applied. Injection made in 5cc increments. No resistance to injection. Good needle visualization. Patient tolerated procedure well.

## 2019-07-18 NOTE — Anesthesia Postprocedure Evaluation (Signed)
Anesthesia Post Note  Patient: Alexis Cole  Procedure(s) Performed: TOTAL KNEE ARTHROPLASTY (Right Knee)     Patient location during evaluation: PACU Anesthesia Type: Spinal Level of consciousness: oriented and awake and alert Pain management: pain level controlled Vital Signs Assessment: post-procedure vital signs reviewed and stable Respiratory status: spontaneous breathing, respiratory function stable and nonlabored ventilation Cardiovascular status: blood pressure returned to baseline and stable Postop Assessment: no headache, no backache, no apparent nausea or vomiting and spinal receding Anesthetic complications: no    Last Vitals:  Vitals:   07/18/19 1500 07/18/19 1515  BP: 134/76 (!) 149/82  Pulse: 70 68  Resp: 14 19  Temp: 36.5 C   SpO2: 95% 94%    Last Pain:  Vitals:   07/18/19 1500  TempSrc:   PainSc: 0-No pain                 Lidia Collum

## 2019-07-18 NOTE — Transfer of Care (Signed)
Immediate Anesthesia Transfer of Care Note  Patient: Alexis Cole  Procedure(s) Performed: TOTAL KNEE ARTHROPLASTY (Right Knee)  Patient Location: PACU  Anesthesia Type:Spinal  Level of Consciousness: awake, alert  and oriented  Airway & Oxygen Therapy: Patient Spontanous Breathing and Patient connected to face mask oxygen  Post-op Assessment: Report given to RN and Post -op Vital signs reviewed and stable  Post vital signs: Reviewed and stable  Last Vitals:  Vitals Value Taken Time  BP    Temp    Pulse 85 07/18/19 1413  Resp 15 07/18/19 1413  SpO2 92 % 07/18/19 1413  Vitals shown include unvalidated device data.  Last Pain:  Vitals:   07/18/19 0945  TempSrc: Oral  PainSc:          Complications: No apparent anesthesia complications

## 2019-07-18 NOTE — Op Note (Signed)
PATIENT ID:      Alexis Cole  MRN:     [id number] DOB/AGE:    [birth year] / 74 y.o.       OPERATIVE REPORT   DATE OF PROCEDURE:  07/18/2019      PREOPERATIVE DIAGNOSIS:   RIGHT KNEE DEGENERATIVE JOINT DISEASE      Estimated body mass index is 35.71 kg/m as calculated from the following:   Height as of 07/10/19: 5\' 5"  (1.651 m).   Weight as of 07/10/19: 97.3 kg.                                                       POSTOPERATIVE DIAGNOSIS:   Same                                                                  PROCEDURE:  Procedure(s): TOTAL KNEE ARTHROPLASTY Using DepuyAttune RP implants #4 Femur, #5Tibia, 8 mm Attune RP bearing, 38 Patella    SURGEON: [name]  ASSISTANT:   Harmon Dun PA-C   (Present and scrubbed throughout the case, critical for assistance with exposure, retraction, instrumentation, and closure.)        ANESTHESIA: spinal, 20cc Exparel, 50cc 0.25% Marcaine EBL: min cc FLUID REPLACEMENT: unk cc crystaloid TOURNIQUET: DRAINS: None TRANEXAMIC ACID: 1gm IV, 2gm topical COMPLICATIONS:  None         INDICATIONS FOR PROCEDURE: The patient has  RIGHT KNEE DEGENERATIVE JOINT DISEASE, varus deformities, XR shows bone on bone arthritis, lateral subluxation of tibia. Patient has failed all conservative measures including anti-inflammatory medicines, narcotics, attempts at exercise and weight loss, cortisone injections and viscosupplementation.  Risks and benefits of surgery have been discussed, questions answered.   DESCRIPTION OF PROCEDURE: The patient identified by armband, received  IV antibiotics, in the holding area at [facility]. Patient taken to the operating room, appropriate anesthetic monitors were attached, and spinal anesthesia was  induced. IV Tranexamic acid was given.Tourniquet applied high to the operative thigh. Lateral post and foot positioner applied to the table, the lower extremity was then prepped and draped in usual sterile fashion from the  toes to the tourniquet. Time-out procedure was performed. Silva Bandy PAC, was present and scrubbed throughout the case, critical for assistance with, positioning, exposure, retraction, instrumentation, and closure.The skin and subcutaneous tissue along the incision was injected with 20 cc of a mixture of Exparel and Marcaine solution, using a 20-gauge by 1-1/2 inch needle. We began the operation, with the knee flexed 130 degrees, by making the anterior midline incision starting at handbreadth above the patella going over the patella 1 cm medial to and 4 cm distal to the tibial tubercle. Small bleeders in the skin and the subcutaneous tissue identified and cauterized. Transverse retinaculum was incised and reflected medially and a medial parapatellar arthrotomy was accomplished. the patella was everted and theprepatellar fat pad resected. The superficial medial collateral ligament was then elevated from anterior to posterior along the proximal flare of the tibia and anterior half of the menisci resected. The knee was hyperflexed exposing bone on bone arthritis. Peripheral and  notch osteophytes as well as the cruciate ligaments were then resected. We continued to work our way around posteriorly along the proximal tibia, and externally rotated the tibia subluxing it out from underneath the femur. A McHale PCL retractor was placed through the notch and a lateral Hohmann retractor placed, and we then entered the proximal tibia in line with the Depuy starter drill in line with the axis of the tibia followed by an intramedullary guide rod and 0-degree posterior slope cutting guide. The tibial cutting guide, 4 degree posterior sloped, was pinned into place allowing resection of 2 mm of bone medially and 10 mm of bone laterally. Satisfied with the tibial resection, we then entered the distal femur 2 mm anterior to the PCL origin with the intramedullary guide rod and applied the distal femoral cutting guide set at 9 mm,  with 5 degrees of valgus. This was pinned along the epicondylar axis. At this point, the distal femoral cut was accomplished without difficulty. We then sized for a #4 femoral component and pinned the guide in 3 degrees of external rotation. The chamfer cutting guide was pinned into place. The anterior, posterior, and chamfer cuts were accomplished without difficulty followed by the Attune RP box cutting guide and the box cut. We also removed posterior osteophytes from the posterior femoral condyles. The posterior capsule was injected with Exparel solution. The knee was brought into full extension. We checked our extension gap and fit a 8 mm bearing. Distracting in extension with a lamina spreader,  bleeders in the posterior capsule, Posterior medial and posterior lateral gutter were cauterized.  The transexamic acid-soaked sponge was then placed in the gap of the knee in extension. The knee was flexed 30. The posterior patella cut was accomplished with the 9.5 mm Attune cutting guide, sized for a 44mm dome, and the fixation pegs drilled.The knee was then once again hyperflexed exposing the proximal tibia. We sized for a # 5 tibial base plate, applied the smokestack and the conical reamer followed by the the Delta fin keel punch. We then hammered into place the Attune RP trial femoral component, drilled the lugs, inserted a  8 mm trial bearing, trial patellar button, and took the knee through range of motion from 0-130 degrees. Medial and lateral ligamentous stability was checked. No thumb pressure was required for patellar Tracking. The tourniquet was 43min. All trial components were removed, mating surfaces irrigated with pulse lavage, and dried with suction and sponges. 10 cc of the Exparel solution was applied to the cancellus bone of the patella distal femur and proximal tibia.  After waiting 30 seconds, the bony surfaces were again, dried with sponges. A double batch of DePuy HV cement was mixed and applied  to all bony metallic mating surfaces except for the posterior condyles of the femur itself. In order, we hammered into place the tibial tray and removed excess cement, the femoral component and removed excess cement. The final Attune RP bearing was inserted, and the knee brought to full extension with compression. The patellar button was clamped into place, and excess cement removed. The knee was held at 30 flexion with compression, while the cement cured. The wound was irrigated out with normal saline solution pulse lavage. The rest of the Exparel was injected into the parapatellar arthrotomy, subcutaneous tissues, and periosteal tissues. The parapatellar arthrotomy was closed with running #1 Vicryl suture. The subcutaneous tissue with 3-0 undyed Vicryl suture, and the skin with running 3-0 SQ vicryl. An Aquacil and Ace wrap  were applied. The patient was taken to recovery room without difficulty.   [name] 07/18/2019, 1:56 PM

## 2019-07-18 NOTE — Interval H&P Note (Signed)
History and Physical Interval Note:  07/18/2019 11:37 AM  Alexis Cole  has presented today for surgery, with the diagnosis of RIGHT KNEE DEGENERATIVE JOINT DISEASE.  The various methods of treatment have been discussed with the patient and family. After consideration of risks, benefits and other options for treatment, the patient has consented to  Procedure(s): TOTAL KNEE ARTHROPLASTY (Right) as a surgical intervention.  The patient's history has been reviewed, patient examined, no change in status, stable for surgery.  I have reviewed the patient's chart and labs.  Questions were answered to the patient's satisfaction.     [name]

## 2019-07-19 DIAGNOSIS — Z96651 Presence of right artificial knee joint: Secondary | ICD-10-CM | POA: Diagnosis not present

## 2019-07-19 DIAGNOSIS — E213 Hyperparathyroidism, unspecified: Secondary | ICD-10-CM | POA: Diagnosis not present

## 2019-07-19 DIAGNOSIS — M1711 Unilateral primary osteoarthritis, right knee: Secondary | ICD-10-CM | POA: Diagnosis not present

## 2019-07-19 DIAGNOSIS — G4700 Insomnia, unspecified: Secondary | ICD-10-CM | POA: Diagnosis not present

## 2019-07-19 DIAGNOSIS — K760 Fatty (change of) liver, not elsewhere classified: Secondary | ICD-10-CM | POA: Diagnosis not present

## 2019-07-19 DIAGNOSIS — I10 Essential (primary) hypertension: Secondary | ICD-10-CM | POA: Diagnosis not present

## 2019-07-19 DIAGNOSIS — Z79899 Other long term (current) drug therapy: Secondary | ICD-10-CM | POA: Diagnosis not present

## 2019-07-19 DIAGNOSIS — E785 Hyperlipidemia, unspecified: Secondary | ICD-10-CM | POA: Diagnosis not present

## 2019-07-19 LAB — CBC
HCT: 37.7 % (ref 36.0–46.0)
Hemoglobin: 12 g/dL (ref 12.0–15.0)
MCH: 30.8 pg (ref 26.0–34.0)
MCHC: 31.8 g/dL (ref 30.0–36.0)
MCV: 96.9 fL (ref 80.0–100.0)
Platelets: 169 10*3/uL (ref 150–400)
RBC: 3.89 MIL/uL (ref 3.87–5.11)
RDW: 13 % (ref 11.5–15.5)
WBC: 12.8 10*3/uL — ABNORMAL HIGH (ref 4.0–10.5)
nRBC: 0 % (ref 0.0–0.2)

## 2019-07-19 NOTE — Plan of Care (Signed)
All discharge instructions were done. Pt verbalized understanding. All questions were answered.

## 2019-07-19 NOTE — TOC Initial Note (Signed)
Transition of Care [facility]) - Initial/Assessment Note    Patient Details  Name: Alexis Cole MRN: [id number] Date of Birth: 1945/09/12  Transition of Care [facility]) CM/SW Contact:    Joaquin Courts, RN Phone Number: 07/19/2019, 11:21 AM  Clinical Narrative:   Cm spoke with patient at bedside.  Patient set up with [facility] at home for [place], reports has RW and 3in1 at home.                  Expected Discharge Plan: [place] Barriers to Discharge: Continued Medical Work up   Patient Goals and CMS Choice Patient states their goals for this hospitalization and ongoing recovery are:: to go home with therapy CMS Medicare.gov Compare Post Acute Care list provided to:: Patient Choice offered to / list presented to : Patient  Expected Discharge Plan and Services Expected Discharge Plan: [place]   Discharge Planning Services: CM Consult Post Acute Care Choice: [place] arrangements for the past 2 months: Single Family Home                 DME Arranged: N/A DME Agency: NA       HH Arranged: PT HH Agency: [facility] [facility] [employer]     Representative spoke with at [place]: pre-arranged in MD office  Prior Living Arrangements/Services Living arrangements for the past 2 months: [place]   Patient language and need for interpreter reviewed:: Yes Do you feel safe going back to the place where you live?: Yes      Need for Family Participation in Patient Care: Yes (Comment) Care giver support system in place?: Yes (comment)   Criminal Activity/Legal Involvement Pertinent to Current Situation/Hospitalization: No - Comment as needed  Activities of Daily Living Home Assistive Devices/Equipment: Environmental consultant (specify type), Bedside commode/3-in-1 ADL Screening (condition at time of admission) Patient's cognitive ability adequate to safely complete daily activities?: Yes Is the patient deaf or have difficulty  hearing?: No Does the patient have difficulty seeing, even when wearing glasses/contacts?: No Does the patient have difficulty concentrating, remembering, or making decisions?: No Patient able to express need for assistance with ADLs?: Yes Does the patient have difficulty dressing or bathing?: No Independently performs ADLs?: Yes (appropriate for developmental age) Does the patient have difficulty walking or climbing stairs?: Yes Weakness of Legs: Right Weakness of Arms/Hands: None  Permission Sought/Granted                  Emotional Assessment Appearance:: Appears stated age Attitude/Demeanor/Rapport: Engaged Affect (typically observed): Accepting Orientation: : Oriented to Self, Oriented to Place, Oriented to  Time, Oriented to Situation   Psych Involvement: No (comment)  Admission diagnosis:  Primary osteoarthritis of right knee [M17.11] Patient Active Problem List   Diagnosis Date Noted  . Primary osteoarthritis of right knee 07/18/2019  . Allergic rhinitis 06/12/2019  . Abnormal pelvic ultrasound 06/12/2019  . Annual physical exam 03/23/2019  . Osteoarthritis of right knee 03/23/2019  . Primary osteoarthritis of left hip 07/26/2018  . Primary osteoarthritis of left knee 03/09/2017  . Increased endometrial stripe thickness 06/13/2016  . Hyperparathyroidism, primary ([place]) 04/27/2016  . Hyperlipidemia 01/04/2015  . Healthcare maintenance 06/24/2014  . Obesity 05/07/2014  . Tobacco use disorder 04/24/2014  . Chronic meniscal tear of knee 04/17/2014  . Seasonal allergies 01/03/2013  . Osteoarthritis of right hip 09/01/2011  . Essential hypertension, benign 03/14/2011  . ANXIETY DEPRESSION 02/10/2009  .  LOW BACK PAIN, CHRONIC 01/08/2008   PCP:  Patriciaann Clan, DO Pharmacy:   [id number] - [place], [place] [id number] [place] [id number] Phone: [phone] Fax:  [phone]  [facility] [address] [place] - [id number] [place] [address] Phone: [phone] Fax: [phone]     Social Determinants of Health (SDOH) Interventions    Readmission Risk Interventions No flowsheet data found.

## 2019-07-19 NOTE — Progress Notes (Signed)
Physical Therapy Treatment Patient Details Name: Alexis Cole MRN: [id number] DOB: Jan 26, 1946 Today's Date: 07/19/2019    History of Present Illness Pt is a 74 y/o female s/p R TKA. PMH includes HTN, bilateral THA, and L TKA.     PT Comments    Pt continues to progress well including reviewing HEP and negotiating stairs.  Pt eager for dc home.   Follow Up Recommendations  Follow surgeon's recommendation for DC plan and follow-up therapies;Supervision for mobility/OOB     Equipment Recommendations  None recommended by PT    Recommendations for Other Services       Precautions / Restrictions Precautions Precautions: Knee Precaution Booklet Issued: No Precaution Comments: Verbally reviewed knee precautions.  Restrictions Weight Bearing Restrictions: No RLE Weight Bearing: Weight bearing as tolerated    Mobility  Bed Mobility Overal bed mobility: Modified Independent Bed Mobility: Supine to Sit;Sit to Supine     Supine to sit: Modified independent (Device/Increase time) Sit to supine: Modified independent (Device/Increase time)      Transfers Overall transfer level: Needs assistance Equipment used: Rolling walker (2 wheeled) Transfers: Sit to/from Stand Sit to Stand: Supervision         General transfer comment: cues for use of UEs to self assist  Ambulation/Gait Ambulation/Gait assistance: Min guard;Supervision Gait Distance (Feet): 200 Feet Assistive device: Rolling walker (2 wheeled) Gait Pattern/deviations: Step-through pattern;Step-to pattern;Decreased stance time - right;Decreased stance time - left;Decreased weight shift to right;Antalgic Gait velocity: Decreased   General Gait Details: cues for posture, position from RW and to slow pace for safety   Stairs Stairs: Yes   Stair Management: No rails;Step to pattern;Forwards;With walker Number of Stairs: 3 General stair comments: single step x 3 with RW and cues for sequence and foot/RW  placement   Wheelchair Mobility    Modified Rankin (Stroke Patients Only)       Balance Overall balance assessment: Needs assistance Sitting-balance support: No upper extremity supported;Feet supported Sitting balance-Leahy Scale: Good     Standing balance support: Bilateral upper extremity supported;During functional activity Standing balance-Leahy Scale: Fair Standing balance comment: Reliant on BUE support.                             Cognition Arousal/Alertness: Awake/alert Behavior During Therapy: WFL for tasks assessed/performed Overall Cognitive Status: Within Functional Limits for tasks assessed                                        Exercises Total Joint Exercises Ankle Circles/Pumps: AROM;Both;15 reps;Supine Quad Sets: AROM;Right;10 reps;Supine Heel Slides: AAROM;Right;15 reps;Supine Straight Leg Raises: AROM;Right;10 reps;Supine Long Arc Quad: AROM;Right;10 reps;Seated    General Comments        Pertinent Vitals/Pain Pain Assessment: 0-10 Pain Score: 5  Pain Location: R knee  Pain Descriptors / Indicators: Aching;Sore Pain Intervention(s): Limited activity within patient's tolerance;Monitored during session;Premedicated before session;Ice applied    Home Living                      Prior Function            PT Goals (current goals can now be found in the care plan section) Acute Rehab PT Goals Patient Stated Goal: to go home PT Goal Formulation: With patient Time For Goal Achievement: 08/01/19 Potential to Achieve Goals: Good Progress towards PT  goals: Progressing toward goals    Frequency    7X/week      PT Plan Current plan remains appropriate    Co-evaluation              AM-PAC PT "6 Clicks" Mobility   Outcome Measure  Help needed turning from your back to your side while in a flat bed without using bedrails?: None Help needed moving from lying on your back to sitting on the side of a  flat bed without using bedrails?: None Help needed moving to and from a bed to a chair (including a wheelchair)?: A Little Help needed standing up from a chair using your arms (e.g., wheelchair or bedside chair)?: A Little Help needed to walk in hospital room?: A Little Help needed climbing 3-5 steps with a railing? : A Little 6 Click Score: 20    End of Session Equipment Utilized During Treatment: Gait belt Activity Tolerance: Patient tolerated treatment well Patient left: in bed;with call bell/phone within reach;with bed alarm set Nurse Communication: Mobility status PT Visit Diagnosis: Other abnormalities of gait and mobility (R26.89);Pain Pain - Right/Left: Right Pain - part of body: Knee     Time: 1140-1210 PT Time Calculation (min) (ACUTE ONLY): 30 min  Charges:  $Gait Training: 8-22 mins $Therapeutic Exercise: 8-22 mins                     Debe Coder PT Acute Rehabilitation Services Pager [phone] Office [phone]     Alben Jepsen 07/19/2019, 12:25 PM

## 2019-07-19 NOTE — Discharge Summary (Signed)
Physician Discharge Summary  Patient ID: Alexis Cole MRN: [id number] DOB/AGE: 73-Sep-1947 73 y.o.  Admit date: 07/18/2019 Discharge date: 07/19/2019  Admission Diagnoses:  Primary osteoarthritis of right knee  Discharge Diagnoses:  Principal Problem:   Primary osteoarthritis of right knee   Past Medical History:  Diagnosis Date  . Acne    but not being treated for  . Acute recurrent sinusitis 08/13/2014  . Anemia    history of--many yrs ago  . Anxiety    but doesn't take any meds for this  . Aortic atherosclerosis ([place]) 06/08/2016   noted on CT abd/pelvis  . Arthritis    Knees, hips back, shoulder  . Arthropathy, lower leg 02/10/2009   For eye care pt is going to Dr. Katy Fitch, and her Ortho surgeon is Dr. Dorna Leitz and [facility].    . Back pain 11/17/2015  . Breast mass in female 08/20/2014   IMPRESSION: Further evaluation is suggested for possible mass in the left breast.The patient will be contacted regarding the findings, and additional imaging will be scheduled.  BI-RADS CATEGORY 0: Incomplete. Need additional imaging evaluation and/or prior mammograms for comparison.   . Chronic back pain   . Complication of anesthesia    States she woke up just after parathyroid surgery while still in OR and attempted to get off the bed, remebers seeing staffs back towards her  . Depression   . Diarrhea   . Diverticulitis 06/08/2016   noted on CT abd/pelvis  . Diverticulosis 06/08/2016   noted on CT abd/pelvis  . Dyslipidemia   . Dyspnea 10/26/2017  . Estrogen deficiency 08/13/2014  . Fatigue 02/24/2016  . Fatty liver 06/08/2016   severe noted on CT abd/pelvis   . Gastric ulcer    history of--many yrs ago  . General weakness 06/08/2016  . Headache(784.0)    History of migraine  . Heart murmur   . History of bronchitis    last time 11months  . History of syncope   . Hx of migraines    last one a month ago  . Hypercalcemia 12/12/2016  . Hyperparathyroidism ([place]) 12/12/2016   . Hypertension     Dr     Higinio Plan  . Insomnia    Nyquil nightly  . Joint pain   . Joint swelling   . Lactose intolerance   . Nausea and vomiting   . Obesity   . Pneumonia 03/15/2016   left lower lobe, left lower lobe atelectasis, elevated left hemidiaphragm noted again, small left pleural effusion  . Pre-syncope 04/12/2016  . PTSD (post-traumatic stress disorder)   . Saddle anesthesia 06/28/2018  . Sinusitis   . Urge incontinence of urine     Surgeries: Procedure(s): TOTAL KNEE ARTHROPLASTY on 07/18/2019   Consultants (if any):   Discharged Condition: Improved  Hospital Course: WINDELL MELOTT is an 74 y.o. female who was admitted 07/18/2019 with a diagnosis of Primary osteoarthritis of right knee and went to the operating room on 07/18/2019 and underwent the above named procedures.  On the day of d/c, she was tol PO, pain controlled, cleared PT, dressing c/d/i externally, and R LE NVI.  She was given perioperative antibiotics:  Anti-infectives (From admission, onward)   Start     Dose/Rate Route Frequency Ordered Stop   07/18/19 1830  ceFAZolin (ANCEF) IVPB 2g/100 mL premix     2 g 200 mL/hr over 30 Minutes Intravenous Every 6 hours 07/18/19 1536 07/19/19 0110   07/18/19 0930  ceFAZolin (ANCEF) IVPB  2g/100 mL premix     2 g 200 mL/hr over 30 Minutes Intravenous On call to O.R. 07/18/19 [id number] 07/18/19 1255    .  She was given sequential compression devices, early ambulation, and SCDs for DVT prophylaxis.  She benefited maximally from the hospital stay and there were no complications.    Recent vital signs:  Vitals:   07/19/19 0614 07/19/19 0952  BP: (!) 116/58 116/67  Pulse: 75 76  Resp: 17 17  Temp: 97.9 F (36.6 C) 98.3 F (36.8 C)  SpO2: 93% 95%    Recent laboratory studies:  Lab Results  Component Value Date   HGB 12.0 07/19/2019   HGB 14.0 07/10/2019   HGB 12.1 07/27/2018   Lab Results  Component Value Date   WBC 12.8 (H) 07/19/2019   PLT 169 07/19/2019    Lab Results  Component Value Date   INR 1.0 07/10/2019   Lab Results  Component Value Date   NA 141 07/10/2019   K 4.4 07/10/2019   CL 106 07/10/2019   CO2 26 07/10/2019   BUN 28 (H) 07/10/2019   CREATININE 1.00 07/10/2019   GLUCOSE 134 (H) 07/10/2019    Discharge Medications:   Allergies as of 07/19/2019      Reactions   Tape Rash      Medication List    STOP taking these medications   cyclobenzaprine 10 MG tablet Commonly known as: FLEXERIL   etodolac 200 MG capsule Commonly known as: LODINE   etodolac 400 MG tablet Commonly known as: LODINE     TAKE these medications   albuterol 108 (90 Base) MCG/ACT inhaler Commonly known as: VENTOLIN HFA INHALE 2-4 PUFFS INTO THE LUNGS EVERY 4 HOURS AS NEEDED FOR WHEEZING (OR COUGH). What changed: See the new instructions.   amLODipine 10 MG tablet Commonly known as: NORVASC Take 1 tablet (10 mg total) by mouth daily.   aspirin EC 325 MG tablet Take 1 tablet (325 mg total) by mouth 2 (two) times daily after a meal. Take x 1 month post op to decrease risk of blood clots.   atorvastatin 40 MG tablet Commonly known as: LIPITOR TAKE 1 TABLET BY MOUTH EVERY DAY   BIOTIN PO Take 500 mcg by mouth at bedtime.   busPIRone 10 MG tablet Commonly known as: BUSPAR TAKE 1 TABLET BY MOUTH TWICE A DAY   Co Q-10 300 MG Caps Take 300 mg by mouth at bedtime.   docusate sodium 100 MG capsule Commonly known as: Colace Take 1 capsule (100 mg total) by mouth 2 (two) times daily.   escitalopram 5 MG tablet Commonly known as: LEXAPRO Take 1 tablet (5 mg total) by mouth daily.   fluticasone 50 MCG/ACT nasal spray Commonly known as: FLONASE Place 1 spray into both nostrils daily as needed for allergies or rhinitis.   folic acid A999333 MCG tablet Commonly known as: FOLVITE Take 400 mcg by mouth at bedtime.   gabapentin 300 MG capsule Commonly known as: NEURONTIN TAKE 1 CAPSULE BY MOUTH THREE TIMES A DAY What changed: See the  new instructions.   HEALTHY EYES PO Take 1 tablet by mouth at bedtime.   Krill Oil 350 MG Caps Take 350 mg by mouth at bedtime.   lisinopril 20 MG tablet Commonly known as: ZESTRIL Take 1 tablet (20 mg total) by mouth daily.   OVER THE COUNTER MEDICATION Take 1 tablet by mouth daily. Neuriva Brain Support   oxyCODONE-acetaminophen 5-325 MG tablet Commonly known as: PERCOCET/ROXICET  Take 1-2 tablets by mouth every 6 (six) hours as needed for severe pain.   tiZANidine 2 MG tablet Commonly known as: ZANAFLEX Take 1 tablet (2 mg total) by mouth every 8 (eight) hours as needed for muscle spasms.   Vitamin D3 50 MCG (2000 UT) capsule Take 2,000 Units by mouth at bedtime.            Durable Medical Equipment  (From admission, onward)         Start     Ordered   07/18/19 1537  DME Walker rolling  Once    Question:  Patient needs a walker to treat with the following condition  Answer:  Primary osteoarthritis of right knee   07/18/19 1536   07/18/19 1537  DME 3 n 1  Once     07/18/19 1536          Diagnostic Studies: DG Chest 2 View  Result Date: 07/10/2019 CLINICAL DATA:  Preoperative evaluation. EXAM: CHEST - 2 VIEW COMPARISON:  May 06, 2018 FINDINGS: Mild atelectasis is seen within the left lung base. There is no evidence of a pleural effusion or pneumothorax. Mild, stable elevation of the left hemidiaphragm is seen. The heart size and mediastinal contours are within normal limits. The visualized skeletal structures are unremarkable. IMPRESSION: Mild left basilar atelectasis. Electronically Signed   By: Virgina Norfolk M.D.   On: 07/10/2019 15:17    Disposition:     Follow-up Information    Dorna Leitz, MD. Go on 07/31/2019.   Specialty: Orthopedic Surgery Why: Your appointment is scheduled for 9:30.  Contact information: [address] [place] [address] [phone]        [facility] Follow up.   Specialty: [place] Why: HHPT  will see you for 5 visits at home prior to starting outpatient physical therapy  Contact information: [place] [address] [phone]        [facility], [place]. Go on 07/31/2019.   Why: You are scheduled to start outpatient physical therapy at 10:40. Please go across the hall after your MD appointment to complete your paperwork  Contact information: Physical Therapy [place] [address] [phone]            Signed: Jolyn Nap 07/19/2019, 11:40 AM

## 2019-07-19 NOTE — Evaluation (Signed)
Physical Therapy Evaluation Patient Details Name: Alexis Cole MRN: [id number] DOB: 1945-08-05 Today's Date: 07/19/2019   History of Present Illness  Pt is a 74 y/o female s/p R TKA. PMH includes HTN, bilateral THA, and L TKA.   Clinical Impression  Pt progressing well with mobility and hopeful for dc home this date    Follow Up Recommendations Follow surgeon's recommendation for DC plan and follow-up therapies;Supervision for mobility/OOB    Equipment Recommendations  None recommended by PT    Recommendations for Other Services       Precautions / Restrictions Precautions Precautions: Knee Precaution Booklet Issued: No Precaution Comments: Verbally reviewed knee precautions.  Restrictions Weight Bearing Restrictions: No RLE Weight Bearing: Weight bearing as tolerated      Mobility  Bed Mobility Overal bed mobility: Needs Assistance Bed Mobility: Supine to Sit     Supine to sit: Supervision     General bed mobility comments: Supervision for safety.   Transfers Overall transfer level: Needs assistance Equipment used: Rolling walker (2 wheeled) Transfers: Sit to/from Stand Sit to Stand: Min guard         General transfer comment: cues for use of UEs to self assist  Ambulation/Gait Ambulation/Gait assistance: Min guard Gait Distance (Feet): 200 Feet Assistive device: Rolling walker (2 wheeled) Gait Pattern/deviations: Step-through pattern;Step-to pattern;Decreased stance time - right;Decreased stance time - left;Decreased weight shift to right;Antalgic Gait velocity: Decreased   General Gait Details: Mildly antalgic gait, however, overall tolerated well. Progressed to step through gait pattern. Demonstrated safe use of RW.   Stairs            Wheelchair Mobility    Modified Rankin (Stroke Patients Only)       Balance Overall balance assessment: Needs assistance Sitting-balance support: No upper extremity supported;Feet supported Sitting  balance-Leahy Scale: Good     Standing balance support: Bilateral upper extremity supported;During functional activity Standing balance-Leahy Scale: Fair Standing balance comment: Reliant on BUE support.                              Pertinent Vitals/Pain Pain Assessment: 0-10 Pain Score: 5  Pain Location: R knee  Pain Descriptors / Indicators: Aching;Sore Pain Intervention(s): Limited activity within patient's tolerance;Monitored during session;Premedicated before session;Ice applied    Home Living                        Prior Function                 Hand Dominance        Extremity/Trunk Assessment                Communication      Cognition Arousal/Alertness: Awake/alert Behavior During Therapy: WFL for tasks assessed/performed Overall Cognitive Status: Within Functional Limits for tasks assessed                                        General Comments      Exercises Total Joint Exercises Ankle Circles/Pumps: AROM;Both;15 reps;Supine Quad Sets: AROM;Right;10 reps;Supine Heel Slides: AAROM;Right;15 reps;Supine Straight Leg Raises: AROM;Right;10 reps;Supine Goniometric ROM: R knee AAROM -5 - 90   Assessment/Plan    PT Assessment    PT Problem List         PT Treatment Interventions  PT Goals (Current goals can be found in the Care Plan section)  Acute Rehab PT Goals Patient Stated Goal: to go home PT Goal Formulation: With patient Time For Goal Achievement: 08/01/19 Potential to Achieve Goals: Good    Frequency 7X/week   Barriers to discharge        Co-evaluation               AM-PAC PT "6 Clicks" Mobility  Outcome Measure Help needed turning from your back to your side while in a flat bed without using bedrails?: None Help needed moving from lying on your back to sitting on the side of a flat bed without using bedrails?: None Help needed moving to and from a bed to a chair (including  a wheelchair)?: A Little Help needed standing up from a chair using your arms (e.g., wheelchair or bedside chair)?: A Little Help needed to walk in hospital room?: A Little Help needed climbing 3-5 steps with a railing? : A Lot 6 Click Score: 19    End of Session Equipment Utilized During Treatment: Gait belt Activity Tolerance: Patient tolerated treatment well Patient left: in chair;with call bell/phone within reach;with chair alarm set;with nursing/sitter in room Nurse Communication: Mobility status PT Visit Diagnosis: Other abnormalities of gait and mobility (R26.89);Pain Pain - Right/Left: Right Pain - part of body: Knee    Time: [id number] PT Time Calculation (min) (ACUTE ONLY): 35 min   Charges:     PT Treatments $Gait Training: 8-22 mins $Therapeutic Exercise: 8-22 mins        [place] Pager [phone] Office [phone]   Jasraj Lappe 07/19/2019, 8:57 AM

## 2019-07-20 DIAGNOSIS — D649 Anemia, unspecified: Secondary | ICD-10-CM | POA: Diagnosis not present

## 2019-07-20 DIAGNOSIS — M545 Low back pain: Secondary | ICD-10-CM | POA: Diagnosis not present

## 2019-07-20 DIAGNOSIS — Z7951 Long term (current) use of inhaled steroids: Secondary | ICD-10-CM | POA: Diagnosis not present

## 2019-07-20 DIAGNOSIS — Z87891 Personal history of nicotine dependence: Secondary | ICD-10-CM | POA: Diagnosis not present

## 2019-07-20 DIAGNOSIS — Z7982 Long term (current) use of aspirin: Secondary | ICD-10-CM | POA: Diagnosis not present

## 2019-07-20 DIAGNOSIS — Z96643 Presence of artificial hip joint, bilateral: Secondary | ICD-10-CM | POA: Diagnosis not present

## 2019-07-20 DIAGNOSIS — I10 Essential (primary) hypertension: Secondary | ICD-10-CM | POA: Diagnosis not present

## 2019-07-20 DIAGNOSIS — M19019 Primary osteoarthritis, unspecified shoulder: Secondary | ICD-10-CM | POA: Diagnosis not present

## 2019-07-20 DIAGNOSIS — I700 Atherosclerosis of aorta: Secondary | ICD-10-CM | POA: Diagnosis not present

## 2019-07-20 DIAGNOSIS — K760 Fatty (change of) liver, not elsewhere classified: Secondary | ICD-10-CM | POA: Diagnosis not present

## 2019-07-20 DIAGNOSIS — E213 Hyperparathyroidism, unspecified: Secondary | ICD-10-CM | POA: Diagnosis not present

## 2019-07-20 DIAGNOSIS — G43909 Migraine, unspecified, not intractable, without status migrainosus: Secondary | ICD-10-CM | POA: Diagnosis not present

## 2019-07-20 DIAGNOSIS — Z96653 Presence of artificial knee joint, bilateral: Secondary | ICD-10-CM | POA: Diagnosis not present

## 2019-07-20 DIAGNOSIS — E785 Hyperlipidemia, unspecified: Secondary | ICD-10-CM | POA: Diagnosis not present

## 2019-07-20 DIAGNOSIS — Z471 Aftercare following joint replacement surgery: Secondary | ICD-10-CM | POA: Diagnosis not present

## 2019-07-20 DIAGNOSIS — K5790 Diverticulosis of intestine, part unspecified, without perforation or abscess without bleeding: Secondary | ICD-10-CM | POA: Diagnosis not present

## 2019-07-22 ENCOUNTER — Encounter: Payer: Self-pay | Admitting: *Deleted

## 2019-07-22 DIAGNOSIS — Z7951 Long term (current) use of inhaled steroids: Secondary | ICD-10-CM | POA: Diagnosis not present

## 2019-07-22 DIAGNOSIS — M545 Low back pain: Secondary | ICD-10-CM | POA: Diagnosis not present

## 2019-07-22 DIAGNOSIS — I10 Essential (primary) hypertension: Secondary | ICD-10-CM | POA: Diagnosis not present

## 2019-07-22 DIAGNOSIS — G43909 Migraine, unspecified, not intractable, without status migrainosus: Secondary | ICD-10-CM | POA: Diagnosis not present

## 2019-07-22 DIAGNOSIS — K5790 Diverticulosis of intestine, part unspecified, without perforation or abscess without bleeding: Secondary | ICD-10-CM | POA: Diagnosis not present

## 2019-07-22 DIAGNOSIS — Z471 Aftercare following joint replacement surgery: Secondary | ICD-10-CM | POA: Diagnosis not present

## 2019-07-22 DIAGNOSIS — Z96643 Presence of artificial hip joint, bilateral: Secondary | ICD-10-CM | POA: Diagnosis not present

## 2019-07-22 DIAGNOSIS — Z7982 Long term (current) use of aspirin: Secondary | ICD-10-CM | POA: Diagnosis not present

## 2019-07-22 DIAGNOSIS — Z96653 Presence of artificial knee joint, bilateral: Secondary | ICD-10-CM | POA: Diagnosis not present

## 2019-07-22 DIAGNOSIS — M19019 Primary osteoarthritis, unspecified shoulder: Secondary | ICD-10-CM | POA: Diagnosis not present

## 2019-07-22 DIAGNOSIS — E213 Hyperparathyroidism, unspecified: Secondary | ICD-10-CM | POA: Diagnosis not present

## 2019-07-22 DIAGNOSIS — E785 Hyperlipidemia, unspecified: Secondary | ICD-10-CM | POA: Diagnosis not present

## 2019-07-22 DIAGNOSIS — Z87891 Personal history of nicotine dependence: Secondary | ICD-10-CM | POA: Diagnosis not present

## 2019-07-22 DIAGNOSIS — D649 Anemia, unspecified: Secondary | ICD-10-CM | POA: Diagnosis not present

## 2019-07-22 DIAGNOSIS — K760 Fatty (change of) liver, not elsewhere classified: Secondary | ICD-10-CM | POA: Diagnosis not present

## 2019-07-22 DIAGNOSIS — I700 Atherosclerosis of aorta: Secondary | ICD-10-CM | POA: Diagnosis not present

## 2019-07-25 DIAGNOSIS — I700 Atherosclerosis of aorta: Secondary | ICD-10-CM | POA: Diagnosis not present

## 2019-07-25 DIAGNOSIS — I10 Essential (primary) hypertension: Secondary | ICD-10-CM | POA: Diagnosis not present

## 2019-07-25 DIAGNOSIS — D649 Anemia, unspecified: Secondary | ICD-10-CM | POA: Diagnosis not present

## 2019-07-25 DIAGNOSIS — E785 Hyperlipidemia, unspecified: Secondary | ICD-10-CM | POA: Diagnosis not present

## 2019-07-25 DIAGNOSIS — M19019 Primary osteoarthritis, unspecified shoulder: Secondary | ICD-10-CM | POA: Diagnosis not present

## 2019-07-25 DIAGNOSIS — Z96643 Presence of artificial hip joint, bilateral: Secondary | ICD-10-CM | POA: Diagnosis not present

## 2019-07-25 DIAGNOSIS — K760 Fatty (change of) liver, not elsewhere classified: Secondary | ICD-10-CM | POA: Diagnosis not present

## 2019-07-25 DIAGNOSIS — M545 Low back pain: Secondary | ICD-10-CM | POA: Diagnosis not present

## 2019-07-25 DIAGNOSIS — Z471 Aftercare following joint replacement surgery: Secondary | ICD-10-CM | POA: Diagnosis not present

## 2019-07-25 DIAGNOSIS — Z7951 Long term (current) use of inhaled steroids: Secondary | ICD-10-CM | POA: Diagnosis not present

## 2019-07-25 DIAGNOSIS — Z87891 Personal history of nicotine dependence: Secondary | ICD-10-CM | POA: Diagnosis not present

## 2019-07-25 DIAGNOSIS — Z7982 Long term (current) use of aspirin: Secondary | ICD-10-CM | POA: Diagnosis not present

## 2019-07-25 DIAGNOSIS — E213 Hyperparathyroidism, unspecified: Secondary | ICD-10-CM | POA: Diagnosis not present

## 2019-07-25 DIAGNOSIS — G43909 Migraine, unspecified, not intractable, without status migrainosus: Secondary | ICD-10-CM | POA: Diagnosis not present

## 2019-07-25 DIAGNOSIS — K5790 Diverticulosis of intestine, part unspecified, without perforation or abscess without bleeding: Secondary | ICD-10-CM | POA: Diagnosis not present

## 2019-07-25 DIAGNOSIS — Z96653 Presence of artificial knee joint, bilateral: Secondary | ICD-10-CM | POA: Diagnosis not present

## 2019-07-28 DIAGNOSIS — D649 Anemia, unspecified: Secondary | ICD-10-CM | POA: Diagnosis not present

## 2019-07-28 DIAGNOSIS — M545 Low back pain: Secondary | ICD-10-CM | POA: Diagnosis not present

## 2019-07-28 DIAGNOSIS — Z7951 Long term (current) use of inhaled steroids: Secondary | ICD-10-CM | POA: Diagnosis not present

## 2019-07-28 DIAGNOSIS — Z87891 Personal history of nicotine dependence: Secondary | ICD-10-CM | POA: Diagnosis not present

## 2019-07-28 DIAGNOSIS — E785 Hyperlipidemia, unspecified: Secondary | ICD-10-CM | POA: Diagnosis not present

## 2019-07-28 DIAGNOSIS — K5790 Diverticulosis of intestine, part unspecified, without perforation or abscess without bleeding: Secondary | ICD-10-CM | POA: Diagnosis not present

## 2019-07-28 DIAGNOSIS — Z7982 Long term (current) use of aspirin: Secondary | ICD-10-CM | POA: Diagnosis not present

## 2019-07-28 DIAGNOSIS — E213 Hyperparathyroidism, unspecified: Secondary | ICD-10-CM | POA: Diagnosis not present

## 2019-07-28 DIAGNOSIS — M19019 Primary osteoarthritis, unspecified shoulder: Secondary | ICD-10-CM | POA: Diagnosis not present

## 2019-07-28 DIAGNOSIS — Z96643 Presence of artificial hip joint, bilateral: Secondary | ICD-10-CM | POA: Diagnosis not present

## 2019-07-28 DIAGNOSIS — Z96653 Presence of artificial knee joint, bilateral: Secondary | ICD-10-CM | POA: Diagnosis not present

## 2019-07-28 DIAGNOSIS — G43909 Migraine, unspecified, not intractable, without status migrainosus: Secondary | ICD-10-CM | POA: Diagnosis not present

## 2019-07-28 DIAGNOSIS — I10 Essential (primary) hypertension: Secondary | ICD-10-CM | POA: Diagnosis not present

## 2019-07-28 DIAGNOSIS — I700 Atherosclerosis of aorta: Secondary | ICD-10-CM | POA: Diagnosis not present

## 2019-07-28 DIAGNOSIS — K760 Fatty (change of) liver, not elsewhere classified: Secondary | ICD-10-CM | POA: Diagnosis not present

## 2019-07-28 DIAGNOSIS — Z471 Aftercare following joint replacement surgery: Secondary | ICD-10-CM | POA: Diagnosis not present

## 2019-07-30 DIAGNOSIS — Z96643 Presence of artificial hip joint, bilateral: Secondary | ICD-10-CM | POA: Diagnosis not present

## 2019-07-30 DIAGNOSIS — Z471 Aftercare following joint replacement surgery: Secondary | ICD-10-CM | POA: Diagnosis not present

## 2019-07-30 DIAGNOSIS — K5790 Diverticulosis of intestine, part unspecified, without perforation or abscess without bleeding: Secondary | ICD-10-CM | POA: Diagnosis not present

## 2019-07-30 DIAGNOSIS — Z7951 Long term (current) use of inhaled steroids: Secondary | ICD-10-CM | POA: Diagnosis not present

## 2019-07-30 DIAGNOSIS — I10 Essential (primary) hypertension: Secondary | ICD-10-CM | POA: Diagnosis not present

## 2019-07-30 DIAGNOSIS — Z7982 Long term (current) use of aspirin: Secondary | ICD-10-CM | POA: Diagnosis not present

## 2019-07-30 DIAGNOSIS — K760 Fatty (change of) liver, not elsewhere classified: Secondary | ICD-10-CM | POA: Diagnosis not present

## 2019-07-30 DIAGNOSIS — Z87891 Personal history of nicotine dependence: Secondary | ICD-10-CM | POA: Diagnosis not present

## 2019-07-30 DIAGNOSIS — I700 Atherosclerosis of aorta: Secondary | ICD-10-CM | POA: Diagnosis not present

## 2019-07-30 DIAGNOSIS — D649 Anemia, unspecified: Secondary | ICD-10-CM | POA: Diagnosis not present

## 2019-07-30 DIAGNOSIS — M19019 Primary osteoarthritis, unspecified shoulder: Secondary | ICD-10-CM | POA: Diagnosis not present

## 2019-07-30 DIAGNOSIS — M545 Low back pain: Secondary | ICD-10-CM | POA: Diagnosis not present

## 2019-07-30 DIAGNOSIS — E785 Hyperlipidemia, unspecified: Secondary | ICD-10-CM | POA: Diagnosis not present

## 2019-07-30 DIAGNOSIS — G43909 Migraine, unspecified, not intractable, without status migrainosus: Secondary | ICD-10-CM | POA: Diagnosis not present

## 2019-07-30 DIAGNOSIS — Z96653 Presence of artificial knee joint, bilateral: Secondary | ICD-10-CM | POA: Diagnosis not present

## 2019-07-30 DIAGNOSIS — E213 Hyperparathyroidism, unspecified: Secondary | ICD-10-CM | POA: Diagnosis not present

## 2019-07-31 DIAGNOSIS — Z9889 Other specified postprocedural states: Secondary | ICD-10-CM | POA: Diagnosis not present

## 2019-07-31 DIAGNOSIS — M6281 Muscle weakness (generalized): Secondary | ICD-10-CM | POA: Diagnosis not present

## 2019-07-31 DIAGNOSIS — Z96651 Presence of right artificial knee joint: Secondary | ICD-10-CM | POA: Diagnosis not present

## 2019-08-01 DIAGNOSIS — M6281 Muscle weakness (generalized): Secondary | ICD-10-CM | POA: Diagnosis not present

## 2019-08-01 DIAGNOSIS — Z96651 Presence of right artificial knee joint: Secondary | ICD-10-CM | POA: Diagnosis not present

## 2019-08-05 DIAGNOSIS — M6281 Muscle weakness (generalized): Secondary | ICD-10-CM | POA: Diagnosis not present

## 2019-08-05 DIAGNOSIS — Z96651 Presence of right artificial knee joint: Secondary | ICD-10-CM | POA: Diagnosis not present

## 2019-08-07 ENCOUNTER — Other Ambulatory Visit: Payer: Self-pay | Admitting: Family Medicine

## 2019-08-07 DIAGNOSIS — Z96651 Presence of right artificial knee joint: Secondary | ICD-10-CM | POA: Diagnosis not present

## 2019-08-07 DIAGNOSIS — F17200 Nicotine dependence, unspecified, uncomplicated: Secondary | ICD-10-CM

## 2019-08-07 DIAGNOSIS — M6281 Muscle weakness (generalized): Secondary | ICD-10-CM | POA: Diagnosis not present

## 2019-08-07 NOTE — Progress Notes (Signed)
Placing order for low dose CT lung cancer screening again. Should qualify now via her insurance with recent USPTF update.   Can we please get her schedule for this in the near future?   Thank you! Dr Higinio Plan.

## 2019-08-12 DIAGNOSIS — Z96651 Presence of right artificial knee joint: Secondary | ICD-10-CM | POA: Diagnosis not present

## 2019-08-12 DIAGNOSIS — M6281 Muscle weakness (generalized): Secondary | ICD-10-CM | POA: Diagnosis not present

## 2019-08-13 ENCOUNTER — Telehealth: Payer: Self-pay

## 2019-08-13 NOTE — Telephone Encounter (Signed)
Attempted to call patient with appointment for imaging. There was no answer.  LVM for appointment:  CT scan  08/21/2019 Thursday 230 pm with an arrival of 200 pm [place]  [place], [place]  [address] [phone]    .Ozella Almond, CMA

## 2019-08-14 DIAGNOSIS — M6281 Muscle weakness (generalized): Secondary | ICD-10-CM | POA: Diagnosis not present

## 2019-08-14 DIAGNOSIS — Z9889 Other specified postprocedural states: Secondary | ICD-10-CM | POA: Diagnosis not present

## 2019-08-14 DIAGNOSIS — Z96651 Presence of right artificial knee joint: Secondary | ICD-10-CM | POA: Diagnosis not present

## 2019-08-19 DIAGNOSIS — Z96651 Presence of right artificial knee joint: Secondary | ICD-10-CM | POA: Diagnosis not present

## 2019-08-19 DIAGNOSIS — M6281 Muscle weakness (generalized): Secondary | ICD-10-CM | POA: Diagnosis not present

## 2019-08-19 NOTE — Telephone Encounter (Signed)
Checked in with patient, doing well after recent knee surgery. Rescheduled her screening CT scan for 7/28 as she recovers. Additionally informed me that they discontinued her norvasc after surgery d/t lower blood pressures with pain medication. Home readings have been around 631 systolic. Discussed it is fine to keep that off for now, add back on if blood pressure consistently >140/90.   Patriciaann Clan, DO

## 2019-08-21 ENCOUNTER — Ambulatory Visit (HOSPITAL_BASED_OUTPATIENT_CLINIC_OR_DEPARTMENT_OTHER): Payer: Medicare Other

## 2019-08-21 DIAGNOSIS — Z96651 Presence of right artificial knee joint: Secondary | ICD-10-CM | POA: Diagnosis not present

## 2019-08-21 DIAGNOSIS — M6281 Muscle weakness (generalized): Secondary | ICD-10-CM | POA: Diagnosis not present

## 2019-08-26 DIAGNOSIS — M6281 Muscle weakness (generalized): Secondary | ICD-10-CM | POA: Diagnosis not present

## 2019-08-26 DIAGNOSIS — Z96651 Presence of right artificial knee joint: Secondary | ICD-10-CM | POA: Diagnosis not present

## 2019-08-28 DIAGNOSIS — Z96651 Presence of right artificial knee joint: Secondary | ICD-10-CM | POA: Diagnosis not present

## 2019-08-28 DIAGNOSIS — M6281 Muscle weakness (generalized): Secondary | ICD-10-CM | POA: Diagnosis not present

## 2019-09-05 ENCOUNTER — Other Ambulatory Visit: Payer: Self-pay | Admitting: Family Medicine

## 2019-09-05 DIAGNOSIS — G4700 Insomnia, unspecified: Secondary | ICD-10-CM

## 2019-09-10 DIAGNOSIS — H353221 Exudative age-related macular degeneration, left eye, with active choroidal neovascularization: Secondary | ICD-10-CM | POA: Diagnosis not present

## 2019-09-15 DIAGNOSIS — Z9889 Other specified postprocedural states: Secondary | ICD-10-CM | POA: Diagnosis not present

## 2019-09-17 ENCOUNTER — Ambulatory Visit (HOSPITAL_BASED_OUTPATIENT_CLINIC_OR_DEPARTMENT_OTHER): Admission: RE | Admit: 2019-09-17 | Payer: Medicare Other | Source: Ambulatory Visit

## 2019-09-22 ENCOUNTER — Encounter: Payer: Self-pay | Admitting: Family Medicine

## 2019-09-22 ENCOUNTER — Other Ambulatory Visit (HOSPITAL_BASED_OUTPATIENT_CLINIC_OR_DEPARTMENT_OTHER): Payer: Self-pay | Admitting: Family Medicine

## 2019-09-22 ENCOUNTER — Other Ambulatory Visit: Payer: Self-pay | Admitting: Family Medicine

## 2019-09-22 ENCOUNTER — Other Ambulatory Visit: Payer: Self-pay

## 2019-09-22 ENCOUNTER — Ambulatory Visit (INDEPENDENT_AMBULATORY_CARE_PROVIDER_SITE_OTHER): Payer: Medicare Other | Admitting: Family Medicine

## 2019-09-22 ENCOUNTER — Ambulatory Visit (HOSPITAL_BASED_OUTPATIENT_CLINIC_OR_DEPARTMENT_OTHER)
Admission: RE | Admit: 2019-09-22 | Discharge: 2019-09-22 | Disposition: A | Discharge status: Home / Self Care | Payer: Medicare Other | Source: Ambulatory Visit | Attending: Family Medicine | Admitting: Family Medicine

## 2019-09-22 ENCOUNTER — Telehealth: Payer: Self-pay

## 2019-09-22 VITALS — BP 138/78 | HR 96 | Ht 65.0 in | Wt 209.6 lb

## 2019-09-22 DIAGNOSIS — R8290 Unspecified abnormal findings in urine: Secondary | ICD-10-CM | POA: Diagnosis not present

## 2019-09-22 DIAGNOSIS — R350 Frequency of micturition: Secondary | ICD-10-CM | POA: Diagnosis not present

## 2019-09-22 DIAGNOSIS — R7303 Prediabetes: Secondary | ICD-10-CM | POA: Diagnosis not present

## 2019-09-22 DIAGNOSIS — M545 Low back pain, unspecified: Secondary | ICD-10-CM

## 2019-09-22 DIAGNOSIS — F17200 Nicotine dependence, unspecified, uncomplicated: Secondary | ICD-10-CM | POA: Insufficient documentation

## 2019-09-22 DIAGNOSIS — M47816 Spondylosis without myelopathy or radiculopathy, lumbar region: Secondary | ICD-10-CM

## 2019-09-22 DIAGNOSIS — G8929 Other chronic pain: Secondary | ICD-10-CM

## 2019-09-22 DIAGNOSIS — Z0000 Encounter for general adult medical examination without abnormal findings: Secondary | ICD-10-CM

## 2019-09-22 DIAGNOSIS — N3000 Acute cystitis without hematuria: Secondary | ICD-10-CM

## 2019-09-22 DIAGNOSIS — I700 Atherosclerosis of aorta: Secondary | ICD-10-CM | POA: Diagnosis not present

## 2019-09-22 DIAGNOSIS — J986 Disorders of diaphragm: Secondary | ICD-10-CM | POA: Diagnosis not present

## 2019-09-22 DIAGNOSIS — Z23 Encounter for immunization: Secondary | ICD-10-CM

## 2019-09-22 DIAGNOSIS — I10 Essential (primary) hypertension: Secondary | ICD-10-CM | POA: Diagnosis not present

## 2019-09-22 LAB — POCT URINALYSIS DIP (MANUAL ENTRY)
Blood, UA: NEGATIVE
Glucose, UA: NEGATIVE mg/dL
Ketones, POC UA: NEGATIVE mg/dL
Leukocytes, UA: NEGATIVE
Nitrite, UA: NEGATIVE
Protein Ur, POC: NEGATIVE mg/dL
Spec Grav, UA: 1.01 (ref 1.010–1.025)
Urobilinogen, UA: 0.2 E.U./dL
pH, UA: 5.5 (ref 5.0–8.0)

## 2019-09-22 LAB — POCT GLYCOSYLATED HEMOGLOBIN (HGB A1C): HbA1c, POC (prediabetic range): 5.9 % (ref 5.7–6.4)

## 2019-09-22 MED ORDER — CEPHALEXIN 500 MG PO CAPS: 500.0000 mg | ORAL_CAPSULE | Freq: Two times a day (BID) | ORAL | 0 refills | Status: AC

## 2019-09-22 MED ORDER — AMLODIPINE BESYLATE 5 MG PO TABS: 5.0000 mg | ORAL_TABLET | Freq: Every day | ORAL | 1 refills | Status: DC

## 2019-09-22 NOTE — Progress Notes (Signed)
SUBJECTIVE:   CHIEF COMPLAINT / HPI: Follow-up  Alexis Cole is a 74 year old female presenting for medication refills.  She recently underwent a total knee arthroplasty of the right knee due to severe osteoarthritis on 07/18/2019.  Tolerated surgery well.  Back pain/lower abdomen numbness: Reports ever since she had her right total knee arthroplasty in May (about 2 months) that she has had a flareup of her back pain and numbness present on the right underneath her abdomen.  Lumbar back pain is right-sided, worse with walking/doing activities, and sitting for prolonged period of time.  She denies any associated leg numbness (other than related to recent knee surgery), extremity weakness, urinary/bowel incontinence or retention, or groin numbness.  Pain feels like cramping in nature.  She is interested in discussing spinal injections/surgical intervention and requested to see Dr. Ellene Cole with [facility] neurosurgery.  Using etorolac for pain relief.  She previously had this numbness over a year ago and obtained MRI lumbar spine 2020 with referred to neurosurgery, however never completed.  It resolved until recently.  Recent knee arthroplasty: Right-sided.  Performed by Dr. Berenice Primas, orthopedic surgery.  Completed physical therapy and did well with this.  Still having some numbness around her knee that is slowly improving.  Urinary symptoms: Reports for the past few days her urine has felt "hot"with urgency/frequency.  Additionally has some suprapubic discomfort.  She has not had a UTI in several years reports this feels similar.  No associated fever, no back pain within CVA region.  Has been using Azo.  Health maintenance: She had a CT low-dose lung cancer screening scan performed earlier today.  PERTINENT  PMH / PSH: Tobacco use, hypertension, elevated BMI, allergies, diffuse osteoarthritis and now s/p hip and bilateral knee replacements.  OBJECTIVE:   BP 138/78   Pulse 96   Ht 5\' 5"  (1.651 m)    Wt 209 lb 9.6 oz (95.1 kg)   SpO2 96%   BMI 34.88 kg/m   General: Alert, NAD HEENT: NCAT, MMM Cardiac: RRR no m/g/r Lungs: Clear bilaterally, no increased WOB  Abdomen: soft, mild tenderness to suprapubic region, nondistended, normoactive bowel sounds Lumbar spine: - Inspection: no gross deformity or asymmetry, swelling or ecchymosis - Palpation: No TTP over the spinous processes, however tender to paraspinal musculature/right facet joint around L5 - ROM: full active ROM of lumbar spine, tender with rotation to right and flexion  - Strength: 5/5 strength of lower extremity in L4-S1 nerve root distributions b/l; normal gait - Neuro: sensation intact in the L4-S1 nerve root distribution b/l with the exception of reported numbness along the right anterior/lower abdomen and approximate L1 distribution picture below, 2+ L4 and S1 reflexes - Special testing: Negative straight leg raise, negative slump, negative Stork test, Negative FABER, Negative Gaenselen's, Negative Waddell's signs Ext: Warm, dry, 2+ distal pulses, no edema    Physical Exam Abdominal:       ASSESSMENT/PLAN:   Lumbar arthropathy Acute on chronic low back pain with recurrent anterior abdominal numbness x2 months.  Reassuringly with preserved strength and without features concerning for cauda equina.  Advanced moderate and severe lumbar/thoracic facet arthropathy and mild spinal stenosis present on MRI in 2020. Additionally noted extruded disc at L1/L2 during that time which is likely the recurrent etiology for numbness discussed above.  Recommended continued Tylenol/etodolac, heat, and staying active as possible.  Will place referral to [place] [facility] for surgical/injection evaluation.  ED precautions discussed.  UTI (urinary tract infection) Presumed via symptoms.  UA obtained  however inaccurate due to Azo use, will obtain culture.  Rx'd Keflex, will discontinue if culture negative.  Healthcare  maintenance Obtained low-dose CT lung cancer screening today, will call when results.  Additionally sent prescription for Shingrix vaccine.  Essential hypertension, benign BP 138/78 today.  She has been on lisinopril 20 mg only, Norvasc 10mg  discontinued after recent hospital stay for arthroplasty.  Discussed her blood pressure is still in a reasonable range for her age, however she is particularly anxious about it being much higher than her previous normal (110-120's).  Will restart Norvasc 5 mg.     Follow-up in 1-2 months for above or sooner if needed.  Patriciaann Clan, [place]

## 2019-09-22 NOTE — Telephone Encounter (Signed)
[facility] calls nurse line stating the patient is there for CT lung cancer screen, however she does not meet the criteria. In order for insurance to cover they will have to change the order to CT chest wo. Verbal given to have this changed so patient can complete her imaging.

## 2019-09-22 NOTE — Patient Instructions (Signed)
It is wonderful to see you today as always.  I have placed a referral to [place] [facility], you should hear from them in the next 2 to 3 weeks or you can also call their office to get this scheduled.  If you have onset of any muscle weakness, changes in your bowel/bladder function, or worsening severe numbness please call and go to the ED.  I would like to see you back in the next 1-2 months to check in.

## 2019-09-24 ENCOUNTER — Encounter: Payer: Self-pay | Admitting: Family Medicine

## 2019-09-24 DIAGNOSIS — M47816 Spondylosis without myelopathy or radiculopathy, lumbar region: Secondary | ICD-10-CM | POA: Insufficient documentation

## 2019-09-24 DIAGNOSIS — N390 Urinary tract infection, site not specified: Secondary | ICD-10-CM | POA: Insufficient documentation

## 2019-09-24 LAB — URINE CULTURE

## 2019-09-24 MED ORDER — ZOSTER VAC RECOMB ADJUVANTED 50 MCG/0.5ML IM SUSR: 0.5000 mL | Freq: Once | INTRAMUSCULAR | 0 refills | Status: AC

## 2019-09-24 NOTE — Assessment & Plan Note (Addendum)
Acute on chronic low back pain with recurrent anterior abdominal numbness x2 months.  Reassuringly with preserved strength and without features concerning for cauda equina.  Advanced moderate and severe lumbar/thoracic facet arthropathy and mild spinal stenosis present on MRI in 2020. Additionally noted extruded disc at L1/L2 during that time which is likely the recurrent etiology for numbness discussed above.  Recommended continued Tylenol/etodolac, heat, and staying active as possible.  Will place referral to [place] [facility] for surgical/injection evaluation.  ED precautions discussed.

## 2019-09-24 NOTE — Assessment & Plan Note (Signed)
Presumed via symptoms.  UA obtained however inaccurate due to Azo use, will obtain culture.  Rx'd Keflex, will discontinue if culture negative.

## 2019-09-24 NOTE — Assessment & Plan Note (Addendum)
BP 138/78 today.  She has been on lisinopril 20 mg only, Norvasc 10mg  discontinued after recent hospital stay for arthroplasty.  Discussed her blood pressure is still in a reasonable range for her age, however she is particularly anxious about it being much higher than her previous normal (110-120's).  Will restart Norvasc 5 mg.

## 2019-09-24 NOTE — Assessment & Plan Note (Addendum)
Obtained low-dose CT lung cancer screening today, will call when results.  Additionally sent prescription for Shingrix vaccine.

## 2019-10-07 ENCOUNTER — Other Ambulatory Visit: Payer: Self-pay | Admitting: Family Medicine

## 2019-10-07 DIAGNOSIS — R9389 Abnormal findings on diagnostic imaging of other specified body structures: Secondary | ICD-10-CM

## 2019-10-08 ENCOUNTER — Telehealth: Payer: Self-pay | Admitting: *Deleted

## 2019-10-08 NOTE — Telephone Encounter (Signed)
Pt already scheduled and informed by GI. Paco Cislo Kennon Holter, CMA

## 2019-10-08 NOTE — Telephone Encounter (Signed)
-----   Message from Patriciaann Clan, DO sent at 10/07/2019  4:45 PM EDT ----- Discussed with patient, will obtain thyroid U/S.   Can we schedule her for a thyroid U/S? Thank you, Dr Higinio Plan

## 2019-10-13 DIAGNOSIS — Z9889 Other specified postprocedural states: Secondary | ICD-10-CM | POA: Diagnosis not present

## 2019-10-16 ENCOUNTER — Other Ambulatory Visit: Payer: Medicare Other

## 2019-10-18 ENCOUNTER — Other Ambulatory Visit: Payer: Self-pay | Admitting: Family Medicine

## 2019-10-18 DIAGNOSIS — I10 Essential (primary) hypertension: Secondary | ICD-10-CM

## 2019-11-03 ENCOUNTER — Ambulatory Visit
Admission: RE | Admit: 2019-11-03 | Discharge: 2019-11-03 | Disposition: A | Discharge status: Home / Self Care | Payer: Medicare Other | Source: Ambulatory Visit | Attending: Family Medicine | Admitting: Family Medicine

## 2019-11-03 DIAGNOSIS — E041 Nontoxic single thyroid nodule: Secondary | ICD-10-CM | POA: Diagnosis not present

## 2019-11-03 DIAGNOSIS — R9389 Abnormal findings on diagnostic imaging of other specified body structures: Secondary | ICD-10-CM

## 2019-11-10 ENCOUNTER — Other Ambulatory Visit: Payer: Self-pay | Admitting: Family Medicine

## 2019-11-10 ENCOUNTER — Telehealth: Payer: Self-pay | Admitting: Family Medicine

## 2019-11-10 DIAGNOSIS — E041 Nontoxic single thyroid nodule: Secondary | ICD-10-CM

## 2019-11-10 NOTE — Telephone Encounter (Signed)
Patient is calling and would like to know if Dr. Higinio Plan could call her to discuss the results from her most recent ultrasound.   The best call back number is [phone]

## 2019-11-13 ENCOUNTER — Other Ambulatory Visit (HOSPITAL_COMMUNITY)
Admission: RE | Admit: 2019-11-13 | Discharge: 2019-11-13 | Disposition: A | Discharge status: Home / Self Care | Payer: Medicare Other | Source: Ambulatory Visit | Attending: Student | Admitting: Student

## 2019-11-13 ENCOUNTER — Ambulatory Visit
Admission: RE | Admit: 2019-11-13 | Discharge: 2019-11-13 | Disposition: A | Discharge status: Home / Self Care | Payer: Medicare Other | Source: Ambulatory Visit | Attending: Family Medicine | Admitting: Family Medicine

## 2019-11-13 DIAGNOSIS — E041 Nontoxic single thyroid nodule: Secondary | ICD-10-CM

## 2019-11-13 DIAGNOSIS — D440 Neoplasm of uncertain behavior of thyroid gland: Secondary | ICD-10-CM | POA: Insufficient documentation

## 2019-11-14 LAB — CYTOLOGY - NON PAP

## 2019-11-15 ENCOUNTER — Other Ambulatory Visit: Payer: Self-pay | Admitting: Family Medicine

## 2019-11-15 DIAGNOSIS — I10 Essential (primary) hypertension: Secondary | ICD-10-CM

## 2019-11-19 DIAGNOSIS — H353221 Exudative age-related macular degeneration, left eye, with active choroidal neovascularization: Secondary | ICD-10-CM | POA: Diagnosis not present

## 2019-11-21 DIAGNOSIS — M4316 Spondylolisthesis, lumbar region: Secondary | ICD-10-CM | POA: Diagnosis not present

## 2019-11-25 DIAGNOSIS — M4316 Spondylolisthesis, lumbar region: Secondary | ICD-10-CM | POA: Diagnosis not present

## 2019-11-26 ENCOUNTER — Ambulatory Visit (INDEPENDENT_AMBULATORY_CARE_PROVIDER_SITE_OTHER): Payer: Medicare Other | Admitting: Family Medicine

## 2019-11-26 ENCOUNTER — Other Ambulatory Visit: Payer: Self-pay

## 2019-11-26 VITALS — BP 124/80 | HR 99 | Ht 65.0 in | Wt 209.0 lb

## 2019-11-26 DIAGNOSIS — Z0000 Encounter for general adult medical examination without abnormal findings: Secondary | ICD-10-CM | POA: Diagnosis not present

## 2019-11-26 DIAGNOSIS — E041 Nontoxic single thyroid nodule: Secondary | ICD-10-CM | POA: Diagnosis not present

## 2019-11-26 DIAGNOSIS — I10 Essential (primary) hypertension: Secondary | ICD-10-CM | POA: Diagnosis not present

## 2019-11-26 DIAGNOSIS — M47816 Spondylosis without myelopathy or radiculopathy, lumbar region: Secondary | ICD-10-CM | POA: Diagnosis not present

## 2019-11-26 DIAGNOSIS — Z23 Encounter for immunization: Secondary | ICD-10-CM

## 2019-11-26 DIAGNOSIS — F17200 Nicotine dependence, unspecified, uncomplicated: Secondary | ICD-10-CM

## 2019-11-26 DIAGNOSIS — F341 Dysthymic disorder: Secondary | ICD-10-CM

## 2019-11-26 NOTE — Patient Instructions (Signed)
It was wonderful to see you today.  I will replace the order to get another biopsy done of your thyroid and then we will go from there pending the results.  I am so glad you start physical therapy and look forward to getting the MRI results from your neurosurgeon.  Please make sure you do the fecal testing.  I would like to see you in about 3 months to check in or sooner if needed.  Housing Resources: [employer]:   [email or web address]-[place].org/  [phone] [place], [place], [place] [address]  . [place] [employer] Medical sales [email or web address]   [phone] . [place]:  [phone]     [address], [place], [place] [address]    [place]:  [phone]     [place], [place], [place] [address]   . [employer] Housing Management:  [phone]  http://[email or web address].[email or web address].cfm  [address], [place] [address] [place], [place] [address] . Homeless: Museum/gallery curator [facility]) [phone]   [address] [place]   . [employer]: [place] By appointment only - Call [phone]  . [employer] (GUM)   [place] [phone] . [employer]  [place] [phone] . [place]  [phone]

## 2019-11-26 NOTE — Progress Notes (Signed)
    SUBJECTIVE:   CHIEF COMPLAINT / HPI: Follow-up  Alexis Cole is a 74 year old female presenting to discuss the following:  Hypertension: Currently on lisinopril 20 mg and recently restarted Norvasc 5 mg in 09/2019.  She has been doing well with this, denies any lightheadedness/dizziness, headache, shortness of breath, or chest pain.  Thyroid nodule: Incidental finding on CT lung cancer screening, proceeded with ultrasound and FNA which returned inconclusive.  She does not have any difficulty swallowing, however now that she knows there is a nodule there it is bothering her occasionally.  Knowing this nodule has been here is making her extremely nervous and would like to have additional testing if possible.  No family or personal history of thyroid cancer.  Chronic back pain with abdominal numbness: Recently established with Alexis Cole at [facility] neurosurgery.  She just started physical therapy for lumbar strengthening on Tuesday which was helpful.  She has a lumbar MRI scheduled for next week.  Felt like therapy helped.  PERTINENT  PMH / PSH: Tobacco use, hypertension, elevated BMI, allergies, diffuse osteoarthritis and now s/p hip and bilateral knee replacements.  OBJECTIVE:   BP 124/80   Pulse 99   Ht 5\' 5"  (1.651 m)   Wt 209 lb (94.8 kg)   SpO2 95%   BMI 34.78 kg/m   General: Alert, NAD HEENT: NCAT, MMM, thyroid not enlarged with difficulty to palpate known nodule Cardiac: RRR Lungs: Clear bilaterally, no increased WOB  Abdomen: soft, non-tender Msk: Moves all extremities spontaneously  Ext: Warm, dry, 2+ distal pulses  ASSESSMENT/PLAN:   Thyroid nodule 2.7 cm right inferior nodule present with [facility] pathology via recent FNA.  Due to indeterminate result in significant patient concern, will proceed with repeat FNA for more confirmatory results.  Additionally will obtain baseline thyroid studies.  Essential hypertension, benign Well-controlled.  Will continue  lisinopril 20 mg and Norvasc 5 mg as is.  Lumbar arthropathy Established with neurosurgery, proceeding with lumbar MRI next week and physical therapy.  Will continue to follow course.  Tobacco use disorder Slowly trying to cut back, however postponing currently given increased dressers with trying to move her housing.  Provided reassurance and encouragement.  ANXIETY DEPRESSION Feels that she is managing well currently with her Lexapro 5 mg and BuSpar 10 BID.  Will continue regimen as is, not interested in therapy at this time.  When she is into her new housing, she is hoping to get a new dog which is previously been a great emotional benefit for her in the past.    Follow-up in 3 months or sooner if needed.  Alexis Cole, Alexis Cole

## 2019-11-27 ENCOUNTER — Encounter: Payer: Self-pay | Admitting: Family Medicine

## 2019-11-27 DIAGNOSIS — M4316 Spondylolisthesis, lumbar region: Secondary | ICD-10-CM | POA: Diagnosis not present

## 2019-11-27 DIAGNOSIS — E041 Nontoxic single thyroid nodule: Secondary | ICD-10-CM | POA: Insufficient documentation

## 2019-11-27 LAB — T3, FREE: T3, Free: 3 pg/mL (ref 2.0–4.4)

## 2019-11-27 LAB — TSH+FREE T4
Free T4: 1.17 ng/dL (ref 0.82–1.77)
TSH: 0.906 u[IU]/mL (ref 0.450–4.500)

## 2019-11-27 NOTE — Assessment & Plan Note (Signed)
Well-controlled.  Will continue lisinopril 20 mg and Norvasc 5 mg as is.

## 2019-11-27 NOTE — Assessment & Plan Note (Addendum)
2.7 cm right inferior nodule present with [facility] pathology via recent FNA.  Due to indeterminate result in significant patient concern, will proceed with repeat FNA for more confirmatory results.  Additionally will obtain baseline thyroid studies.

## 2019-11-27 NOTE — Assessment & Plan Note (Signed)
Slowly trying to cut back, however postponing currently given increased dressers with trying to move her housing.  Provided reassurance and encouragement.

## 2019-11-27 NOTE — Assessment & Plan Note (Addendum)
Feels that she is managing well currently with her Lexapro 5 mg and BuSpar 10 BID.  Will continue regimen as is, not interested in therapy at this time.  When she is into her new housing, she is hoping to get a new dog which is previously been a great emotional benefit for her in the past.

## 2019-11-27 NOTE — Assessment & Plan Note (Signed)
Established with neurosurgery, proceeding with lumbar MRI next week and physical therapy.  Will continue to follow course.

## 2019-11-28 ENCOUNTER — Encounter: Payer: Self-pay | Admitting: Family Medicine

## 2019-12-01 ENCOUNTER — Other Ambulatory Visit: Payer: Self-pay | Admitting: Family Medicine

## 2019-12-01 DIAGNOSIS — G4700 Insomnia, unspecified: Secondary | ICD-10-CM

## 2019-12-02 DIAGNOSIS — M4316 Spondylolisthesis, lumbar region: Secondary | ICD-10-CM | POA: Diagnosis not present

## 2019-12-04 DIAGNOSIS — M545 Low back pain, unspecified: Secondary | ICD-10-CM | POA: Diagnosis not present

## 2019-12-04 DIAGNOSIS — M4316 Spondylolisthesis, lumbar region: Secondary | ICD-10-CM | POA: Diagnosis not present

## 2019-12-05 DIAGNOSIS — M4316 Spondylolisthesis, lumbar region: Secondary | ICD-10-CM | POA: Diagnosis not present

## 2019-12-09 ENCOUNTER — Ambulatory Visit
Admission: RE | Admit: 2019-12-09 | Discharge: 2019-12-09 | Disposition: A | Discharge status: Home / Self Care | Payer: Medicare Other | Source: Ambulatory Visit | Attending: Family Medicine | Admitting: Family Medicine

## 2019-12-09 ENCOUNTER — Other Ambulatory Visit: Payer: Self-pay | Admitting: Family Medicine

## 2019-12-09 ENCOUNTER — Other Ambulatory Visit (HOSPITAL_COMMUNITY)
Admission: RE | Admit: 2019-12-09 | Discharge: 2019-12-09 | Disposition: A | Discharge status: Home / Self Care | Payer: Medicare Other | Source: Ambulatory Visit | Attending: Radiology | Admitting: Radiology

## 2019-12-09 DIAGNOSIS — D440 Neoplasm of uncertain behavior of thyroid gland: Secondary | ICD-10-CM | POA: Diagnosis present

## 2019-12-09 DIAGNOSIS — E041 Nontoxic single thyroid nodule: Secondary | ICD-10-CM | POA: Diagnosis not present

## 2019-12-09 DIAGNOSIS — I10 Essential (primary) hypertension: Secondary | ICD-10-CM

## 2019-12-09 DIAGNOSIS — D34 Benign neoplasm of thyroid gland: Secondary | ICD-10-CM | POA: Insufficient documentation

## 2019-12-09 DIAGNOSIS — M4316 Spondylolisthesis, lumbar region: Secondary | ICD-10-CM | POA: Diagnosis not present

## 2019-12-10 LAB — CYTOLOGY - NON PAP

## 2019-12-11 DIAGNOSIS — M4316 Spondylolisthesis, lumbar region: Secondary | ICD-10-CM | POA: Diagnosis not present

## 2019-12-15 DIAGNOSIS — M5116 Intervertebral disc disorders with radiculopathy, lumbar region: Secondary | ICD-10-CM | POA: Diagnosis not present

## 2019-12-16 DIAGNOSIS — M4316 Spondylolisthesis, lumbar region: Secondary | ICD-10-CM | POA: Diagnosis not present

## 2019-12-17 DIAGNOSIS — M5416 Radiculopathy, lumbar region: Secondary | ICD-10-CM | POA: Diagnosis not present

## 2019-12-18 DIAGNOSIS — M4316 Spondylolisthesis, lumbar region: Secondary | ICD-10-CM | POA: Diagnosis not present

## 2019-12-22 DIAGNOSIS — M4316 Spondylolisthesis, lumbar region: Secondary | ICD-10-CM | POA: Diagnosis not present

## 2019-12-26 DIAGNOSIS — M4316 Spondylolisthesis, lumbar region: Secondary | ICD-10-CM | POA: Diagnosis not present

## 2019-12-30 DIAGNOSIS — M4316 Spondylolisthesis, lumbar region: Secondary | ICD-10-CM | POA: Diagnosis not present

## 2020-01-01 DIAGNOSIS — M4316 Spondylolisthesis, lumbar region: Secondary | ICD-10-CM | POA: Diagnosis not present

## 2020-01-13 DIAGNOSIS — Z96651 Presence of right artificial knee joint: Secondary | ICD-10-CM | POA: Diagnosis not present

## 2020-01-13 DIAGNOSIS — Z96652 Presence of left artificial knee joint: Secondary | ICD-10-CM | POA: Diagnosis not present

## 2020-02-11 DIAGNOSIS — H353221 Exudative age-related macular degeneration, left eye, with active choroidal neovascularization: Secondary | ICD-10-CM | POA: Diagnosis not present

## 2020-02-24 ENCOUNTER — Other Ambulatory Visit: Payer: Self-pay | Admitting: Family Medicine

## 2020-02-24 ENCOUNTER — Other Ambulatory Visit: Payer: Self-pay | Admitting: *Deleted

## 2020-02-24 DIAGNOSIS — I10 Essential (primary) hypertension: Secondary | ICD-10-CM

## 2020-02-25 MED ORDER — ATORVASTATIN CALCIUM 40 MG PO TABS
40.0000 mg | ORAL_TABLET | Freq: Every day | ORAL | 3 refills | Status: DC
Start: 2020-02-25 — End: 2021-05-02

## 2020-02-26 ENCOUNTER — Other Ambulatory Visit: Payer: Self-pay | Admitting: *Deleted

## 2020-02-26 DIAGNOSIS — G4700 Insomnia, unspecified: Secondary | ICD-10-CM

## 2020-02-26 MED ORDER — GABAPENTIN 300 MG PO CAPS: 300.0000 mg | ORAL_CAPSULE | Freq: Three times a day (TID) | ORAL | 2 refills | Status: DC | PRN

## 2020-03-31 DIAGNOSIS — M4316 Spondylolisthesis, lumbar region: Secondary | ICD-10-CM | POA: Diagnosis not present

## 2020-04-21 DIAGNOSIS — H353212 Exudative age-related macular degeneration, right eye, with inactive choroidal neovascularization: Secondary | ICD-10-CM | POA: Diagnosis not present

## 2020-04-21 DIAGNOSIS — H353221 Exudative age-related macular degeneration, left eye, with active choroidal neovascularization: Secondary | ICD-10-CM | POA: Diagnosis not present

## 2020-04-22 DIAGNOSIS — Z96651 Presence of right artificial knee joint: Secondary | ICD-10-CM | POA: Diagnosis not present

## 2020-04-22 DIAGNOSIS — M25551 Pain in right hip: Secondary | ICD-10-CM | POA: Diagnosis not present

## 2020-04-22 DIAGNOSIS — Z96652 Presence of left artificial knee joint: Secondary | ICD-10-CM | POA: Diagnosis not present

## 2020-04-22 DIAGNOSIS — Z96641 Presence of right artificial hip joint: Secondary | ICD-10-CM | POA: Diagnosis not present

## 2020-04-26 DIAGNOSIS — M5116 Intervertebral disc disorders with radiculopathy, lumbar region: Secondary | ICD-10-CM | POA: Diagnosis not present

## 2020-04-26 DIAGNOSIS — M5416 Radiculopathy, lumbar region: Secondary | ICD-10-CM | POA: Diagnosis not present

## 2020-05-20 ENCOUNTER — Other Ambulatory Visit: Payer: Self-pay | Admitting: Family Medicine

## 2020-05-20 DIAGNOSIS — F341 Dysthymic disorder: Secondary | ICD-10-CM

## 2020-05-24 ENCOUNTER — Encounter: Payer: Self-pay | Admitting: Family Medicine

## 2020-05-24 ENCOUNTER — Other Ambulatory Visit: Payer: Self-pay

## 2020-05-24 ENCOUNTER — Ambulatory Visit (INDEPENDENT_AMBULATORY_CARE_PROVIDER_SITE_OTHER): Payer: Medicare Other | Admitting: Family Medicine

## 2020-05-24 VITALS — BP 128/70 | HR 79 | Ht 65.0 in | Wt 206.2 lb

## 2020-05-24 DIAGNOSIS — I10 Essential (primary) hypertension: Secondary | ICD-10-CM | POA: Diagnosis not present

## 2020-05-24 DIAGNOSIS — E785 Hyperlipidemia, unspecified: Secondary | ICD-10-CM | POA: Diagnosis not present

## 2020-05-24 DIAGNOSIS — G4700 Insomnia, unspecified: Secondary | ICD-10-CM

## 2020-05-24 DIAGNOSIS — F341 Dysthymic disorder: Secondary | ICD-10-CM

## 2020-05-24 DIAGNOSIS — M47816 Spondylosis without myelopathy or radiculopathy, lumbar region: Secondary | ICD-10-CM | POA: Diagnosis not present

## 2020-05-24 DIAGNOSIS — J301 Allergic rhinitis due to pollen: Secondary | ICD-10-CM

## 2020-05-24 MED ORDER — GABAPENTIN 300 MG PO CAPS: 300.0000 mg | ORAL_CAPSULE | Freq: Four times a day (QID) | ORAL | 2 refills | Status: DC | PRN

## 2020-05-24 MED ORDER — AMLODIPINE BESYLATE 5 MG PO TABS: 5.0000 mg | ORAL_TABLET | Freq: Every day | ORAL | 1 refills | Status: DC

## 2020-05-24 MED ORDER — FLUTICASONE PROPIONATE 50 MCG/ACT NA SUSP: 1.0000 | Freq: Every day | NASAL | 3 refills | Status: DC | PRN

## 2020-05-24 NOTE — Progress Notes (Signed)
    SUBJECTIVE:   CHIEF COMPLAINT / HPI: Check in   Alexis Cole is a 75 year old female presenting to check in.    She states overall she is doing well, she recently adopted a 69-year-old dog named "Cardi" about 2 weeks ago.  This has been a tremendous help with her depression and activity.  She has now been walking about 4 times daily approximately 20-30 minutes at a time.  She has been off her Lexapro for about 2 months and feels that she is maintaining her mood well.  She also reports that she is cut down her smoking in half.  Now approximately 1 pack/day.  She has been intermittently smoking since age 71, off and on.  Has quit anywhere from 3-9 years at a time.  She will continue cutting down slowly, not interested in any medication or nicotine replacement.  Still following with neurosurgery and getting epidural injections, 2nd one a few weeks ago.  First 1 lasted 4 months, hopeful this will last longer.  She would like to increase her gabapentin at night as it seems to hurt more then after being more active during the day with her new dog.  PERTINENT  PMH / PSH: Tobacco use, hypertension, elevated BMI, allergies,diffuse osteoarthritis and now s/p hip and bilateral knee replacements.  OBJECTIVE:   BP 128/70   Pulse 79   Ht 5\' 5"  (1.651 m)   Wt 206 lb 3.2 oz (93.5 kg)   SpO2 94%   BMI 34.31 kg/m   General: Alert, NAD HEENT: NCAT, MMM, nasal congestion present  Cardiac: RRR no m/g/r Lungs: Clear bilaterally, no increased WOB  Abdomen: soft Msk: Moves all extremities spontaneously, normal gait without assistive device Ext: Warm, dry, 2+ distal pulses, no edema   ASSESSMENT/PLAN:   Essential hypertension, benign Well-controlled.  Continue Norvasc 5 mg and lisinopril 20 mg as is.  Check BMP today.  Encouraged to schedule RN visit to verify her home BP cuff so that she can use this to check when needed.   Hyperlipidemia Atorvastatin 40 mg daily for primary prevention.  Check  lipid panel.  Lumbar arthropathy Receiving epidural injections through neurosurgery.  Will increase nightly gabapentin to 600 mg for improved pain and concurrent insomnia due to this.  Cautioned on drowsiness and to be careful with increased fall risk.  Allergic rhinitis Refilled Flonase.  She does have a chronic cough associated with postnasal drip especially in the springtime, discussed evaluation for COPD in the future given her long term smoking history.  She would like to consider PFTs after spring is over.  Fortunately breathing comfortably on exam with no wheezing and reports no difficulty breathing, thus no urgency to evaluation.  ANXIETY DEPRESSION Currently on BuSpar only, off Lexapro.  She fortunately just adopted a new dog, which she has been anxiously awaiting for quite some time now.  She already has noticed a significant improvement in her mood.  Provided supportive listening, can stay off of Lexapro for now and add back on in the future if needed.    Follow-up for RN visit, otherwise follow-up in approximately 3 months or sooner if needed.  Patriciaann Clan, [place]

## 2020-05-24 NOTE — Assessment & Plan Note (Addendum)
Receiving epidural injections through neurosurgery.  Will increase nightly gabapentin to 600 mg for improved pain and concurrent insomnia due to this.  Cautioned on drowsiness and to be careful with increased fall risk.

## 2020-05-24 NOTE — Assessment & Plan Note (Signed)
Atorvastatin 40 mg daily for primary prevention.  Check lipid panel.

## 2020-05-24 NOTE — Assessment & Plan Note (Addendum)
Well-controlled.  Continue Norvasc 5 mg and lisinopril 20 mg as is.  Check BMP today.  Encouraged to schedule RN visit to verify her home BP cuff so that she can use this to check when needed.

## 2020-05-24 NOTE — Patient Instructions (Signed)
I am so excited for you and your puppy!    Please schedule a nurses visit to check your blood pressure cuff (bring it with you).   You can always restart your lexapro when you need.

## 2020-05-24 NOTE — Assessment & Plan Note (Signed)
Refilled Flonase.  She does have a chronic cough associated with postnasal drip especially in the springtime, discussed evaluation for COPD in the future given her long term smoking history.  She would like to consider PFTs after spring is over.  Fortunately breathing comfortably on exam with no wheezing and reports no difficulty breathing, thus no urgency to evaluation.

## 2020-05-24 NOTE — Assessment & Plan Note (Signed)
Currently on BuSpar only, off Lexapro.  She fortunately just adopted a new dog, which she has been anxiously awaiting for quite some time now.  She already has noticed a significant improvement in her mood.  Provided supportive listening, can stay off of Lexapro for now and add back on in the future if needed.

## 2020-05-25 ENCOUNTER — Encounter: Payer: Self-pay | Admitting: Family Medicine

## 2020-05-25 LAB — BASIC METABOLIC PANEL
BUN/Creatinine Ratio: 20 (ref 12–28)
BUN: 17 mg/dL (ref 8–27)
CO2: 20 mmol/L (ref 20–29)
Calcium: 10 mg/dL (ref 8.7–10.3)
Chloride: 102 mmol/L (ref 96–106)
Creatinine, Ser: 0.83 mg/dL (ref 0.57–1.00)
Glucose: 110 mg/dL — ABNORMAL HIGH (ref 65–99)
Potassium: 4.3 mmol/L (ref 3.5–5.2)
Sodium: 139 mmol/L (ref 134–144)
eGFR: 74 mL/min/{1.73_m2} (ref >59)

## 2020-05-25 LAB — LIPID PANEL
Chol/HDL Ratio: 2.8 ratio (ref 0.0–4.4)
Cholesterol, Total: 148 mg/dL (ref 100–199)
HDL: 53 mg/dL (ref >39)
LDL Chol Calc (NIH): 83 mg/dL (ref 0–99)
Triglycerides: 60 mg/dL (ref 0–149)
VLDL Cholesterol Cal: 12 mg/dL (ref 5–40)

## 2020-05-26 ENCOUNTER — Other Ambulatory Visit: Payer: Self-pay | Admitting: Family Medicine

## 2020-05-26 DIAGNOSIS — G4700 Insomnia, unspecified: Secondary | ICD-10-CM

## 2020-06-07 DIAGNOSIS — Z23 Encounter for immunization: Secondary | ICD-10-CM | POA: Diagnosis not present

## 2020-06-10 ENCOUNTER — Telehealth: Payer: Self-pay | Admitting: *Deleted

## 2020-06-10 NOTE — Telephone Encounter (Signed)
It shouldn't interact with any of her medications, so she should be fine to take it if she wishes to do so.   Thank you, Patriciaann Clan, DO

## 2020-06-10 NOTE — Telephone Encounter (Signed)
Patient informed.  Krishiv Sandler,CMA  

## 2020-06-10 NOTE — Telephone Encounter (Signed)
Pt called and wants to know if she can add collagen to her daily medication regimen.  She wants to make sure it will not interact with her other meds. Christen Bame, CMA

## 2020-06-14 ENCOUNTER — Other Ambulatory Visit: Payer: Self-pay | Admitting: Family Medicine

## 2020-06-14 DIAGNOSIS — F341 Dysthymic disorder: Secondary | ICD-10-CM

## 2020-06-22 ENCOUNTER — Other Ambulatory Visit: Payer: Self-pay | Admitting: Family Medicine

## 2020-06-22 DIAGNOSIS — Z1231 Encounter for screening mammogram for malignant neoplasm of breast: Secondary | ICD-10-CM

## 2020-06-30 DIAGNOSIS — H353221 Exudative age-related macular degeneration, left eye, with active choroidal neovascularization: Secondary | ICD-10-CM | POA: Diagnosis not present

## 2020-07-18 ENCOUNTER — Other Ambulatory Visit: Payer: Self-pay | Admitting: Family Medicine

## 2020-07-26 ENCOUNTER — Encounter: Payer: Self-pay | Admitting: Family Medicine

## 2020-07-26 ENCOUNTER — Ambulatory Visit (INDEPENDENT_AMBULATORY_CARE_PROVIDER_SITE_OTHER): Payer: Medicare Other | Admitting: Family Medicine

## 2020-07-26 ENCOUNTER — Other Ambulatory Visit: Payer: Self-pay

## 2020-07-26 VITALS — BP 130/80 | HR 80 | Wt 203.8 lb

## 2020-07-26 DIAGNOSIS — M545 Low back pain, unspecified: Secondary | ICD-10-CM | POA: Diagnosis not present

## 2020-07-26 DIAGNOSIS — G8929 Other chronic pain: Secondary | ICD-10-CM | POA: Diagnosis not present

## 2020-07-26 DIAGNOSIS — Z0000 Encounter for general adult medical examination without abnormal findings: Secondary | ICD-10-CM

## 2020-07-26 DIAGNOSIS — F341 Dysthymic disorder: Secondary | ICD-10-CM

## 2020-07-26 DIAGNOSIS — M47816 Spondylosis without myelopathy or radiculopathy, lumbar region: Secondary | ICD-10-CM | POA: Diagnosis not present

## 2020-07-26 DIAGNOSIS — I10 Essential (primary) hypertension: Secondary | ICD-10-CM

## 2020-07-26 DIAGNOSIS — F17200 Nicotine dependence, unspecified, uncomplicated: Secondary | ICD-10-CM

## 2020-07-26 MED ORDER — GABAPENTIN 400 MG PO CAPS: 400.0000 mg | ORAL_CAPSULE | Freq: Three times a day (TID) | ORAL | 1 refills | Status: DC

## 2020-07-26 NOTE — Assessment & Plan Note (Signed)
Associated with chronic low back pain and receiving epidural injections through neurosurgery.  Awaiting surgery as discussed above.  We will transition back gabapentin to 400 mg 3 times daily per patient preference.

## 2020-07-26 NOTE — Assessment & Plan Note (Signed)
Working towards cutting back, her neurosurgeon will not proceed with surgery until she has quit.  Currently 1 PPD, previously 2.  Provided congratulations and encouragement to continue working towards full cessation.  Continue nicotine patches.  Discussed alternative stress relieving activities.

## 2020-07-26 NOTE — Patient Instructions (Signed)
1-800-QUIT-NOW   You are doing a WONDERFUL job. Keep up the good work. Work towards finding a stress relief activity.

## 2020-07-26 NOTE — Progress Notes (Signed)
SUBJECTIVE:   CHIEF COMPLAINT / HPI: Check in   Alexis Cole is a 75 year old female presenting to check-in.  She states she overall is doing well, still happy to have her new 12-year-old Pup "Cardi" who has been a tremendous help with her depression/anxiety.  She is following with neurosurgery and getting epidural injections for her chronic back pain.  The most recent injection only lasted 2 weeks.  Her neurosurgeon does not want to be proceed with surgery until she completely quit smoking.  She now is down to 1 pack daily, recently was smoking up to 2 packs.  She is using a nicotine patch to help.  She is very stressed because she feels like using cigarettes is a stress relief activity for her.  She would like to go back to the different dosing of her gabapentin to help with her nerve pains.   PERTINENT  PMH / PSH: Tobacco use, hypertension, elevated BMI, allergies,diffuse osteoarthritis and now s/p hip and bilateral knee replacements.  OBJECTIVE:   BP 130/80   Pulse 80   Wt 203 lb 12.8 oz (92.4 kg)   SpO2 95%   BMI 33.91 kg/m   General: Alert, NAD HEENT: NCAT, MMM Cardiac: RRR  Lungs: Clear bilaterally, no increased WOB  Msk: Normal gait  Ext: Warm, dry, 2+ distal pulses  ASSESSMENT/PLAN:   Tobacco use disorder Working towards cutting back, her neurosurgeon will not proceed with surgery until she has quit.  Currently 1 PPD, previously 2.  Provided congratulations and encouragement to continue working towards full cessation.  Continue nicotine patches.  Discussed alternative stress relieving activities.  Essential hypertension, benign Well-controlled.  Continue Norvasc 5 mg and lisinopril 20 mg as is.  Lumbar arthropathy Associated with chronic low back pain and receiving epidural injections through neurosurgery.  Awaiting surgery as discussed above.  We will transition back gabapentin to 400 mg 3 times daily per patient preference.  Healthcare maintenance She has her  FOBT colon cancer screening kit at home however has not completed this.  Instructed to complete and return.  ANXIETY DEPRESSION Doing well now with her support dog at home.  Continue medications as is.    Follow-up in 3 months or sooner if needed to check-in/meet new PCP.   Patriciaann Clan, [place]

## 2020-07-26 NOTE — Assessment & Plan Note (Signed)
She has her FOBT colon cancer screening kit at home however has not completed this.  Instructed to complete and return.

## 2020-07-26 NOTE — Assessment & Plan Note (Signed)
Doing well now with her support dog at home.  Continue medications as is.

## 2020-07-26 NOTE — Assessment & Plan Note (Signed)
Well-controlled.  Continue Norvasc 5 mg and lisinopril 20 mg as is.

## 2020-08-13 ENCOUNTER — Ambulatory Visit
Admission: RE | Admit: 2020-08-13 | Discharge: 2020-08-13 | Disposition: A | Discharge status: Home / Self Care | Payer: Medicare Other | Source: Ambulatory Visit | Attending: Family Medicine | Admitting: Family Medicine

## 2020-08-13 ENCOUNTER — Other Ambulatory Visit: Payer: Self-pay

## 2020-08-13 DIAGNOSIS — Z1231 Encounter for screening mammogram for malignant neoplasm of breast: Secondary | ICD-10-CM

## 2020-08-26 ENCOUNTER — Other Ambulatory Visit: Payer: Self-pay | Admitting: Family Medicine

## 2020-08-26 DIAGNOSIS — J301 Allergic rhinitis due to pollen: Secondary | ICD-10-CM

## 2020-09-29 DIAGNOSIS — H353221 Exudative age-related macular degeneration, left eye, with active choroidal neovascularization: Secondary | ICD-10-CM | POA: Diagnosis not present

## 2020-09-30 ENCOUNTER — Other Ambulatory Visit: Payer: Self-pay

## 2020-09-30 ENCOUNTER — Encounter: Payer: Self-pay | Admitting: Family Medicine

## 2020-09-30 ENCOUNTER — Ambulatory Visit (INDEPENDENT_AMBULATORY_CARE_PROVIDER_SITE_OTHER): Payer: Medicare Other | Admitting: Family Medicine

## 2020-09-30 VITALS — BP 141/94 | HR 84 | Wt 195.4 lb

## 2020-09-30 DIAGNOSIS — F17200 Nicotine dependence, unspecified, uncomplicated: Secondary | ICD-10-CM | POA: Diagnosis not present

## 2020-09-30 DIAGNOSIS — G8929 Other chronic pain: Secondary | ICD-10-CM | POA: Diagnosis not present

## 2020-09-30 DIAGNOSIS — Z0000 Encounter for general adult medical examination without abnormal findings: Secondary | ICD-10-CM | POA: Diagnosis not present

## 2020-09-30 DIAGNOSIS — M545 Low back pain, unspecified: Secondary | ICD-10-CM | POA: Diagnosis not present

## 2020-09-30 DIAGNOSIS — F341 Dysthymic disorder: Secondary | ICD-10-CM

## 2020-09-30 DIAGNOSIS — E041 Nontoxic single thyroid nodule: Secondary | ICD-10-CM

## 2020-09-30 NOTE — Patient Instructions (Signed)
It was great meeting you today! Today you came in to check in and I am glad to see you are doing well!   I placed a referral for your thyroid ultrasound. We will call you with the date of your appontment.   I have also given you the information to call to get more information/schedule your colonoscopy.   I'd like to see you back in about 3 months to follow up, but if you need to be seen earlier than that for any new issues we're happy to fit you in, just give [place] a call!  If you haven't already, sign up for My Chart to have easy access to your labs results, and communication with your primary care physician.  Feel free to call with any questions or concerns at any time, at [phone].   Take care,  Dr. [name] [facility]     Per CDC:   Tips for Better Sleep Good sleep habits (sometimes referred to as "sleep hygiene") can help you get a good night's sleep. Some habits that can improve your sleep health: Be consistent. Go to bed at the same time each night and get up at the same time each morning, including on the weekends Make sure your bedroom is quiet, dark, relaxing, and at a comfortable temperature Remove electronic devices, such as TVs, computers, and smart phones, from the bedroom Avoid large meals, caffeine, and alcohol before bedtimeGet some exercise. Being physically active during the day can help you fall asleep more easily at night.

## 2020-09-30 NOTE — Assessment & Plan Note (Signed)
Sent referral for annual imaging for thyroid nodule.

## 2020-09-30 NOTE — Assessment & Plan Note (Signed)
Recommended speaking with a therapist or counselor given recent pause on lexapro by choice (due to Ms. Reth's concern of weight gain). Does not want additional support at this time. Will reevaluate anxiety at next visit.

## 2020-09-30 NOTE — Assessment & Plan Note (Addendum)
Adjusted gabapentin dose to '300mg'$  per discussion at last visit with Dr. Higinio Plan. '400mg'$  makes her feel funny. Pain was previously well controlled on '300mg'$ . Will ensure next refill reflects this

## 2020-09-30 NOTE — Progress Notes (Addendum)
SUBJECTIVE:   CHIEF COMPLAINT / HPI:   [name] is here for an annual wellness visit.   Scans - She needs a referral for a scan as part of her annual thyroid nodule check. She wasn't sure how to go about it so came here. She also thinks she needs a scan for her lungs.  Meds - [name] would like to make sure her next refill of gabapentin is 300 mg. She had previously talked to Dr. Higinio Plan to reduce it given it makes her feel funny, but the change didn't go through to the pharmacy. She needs to use flonase 4 times (more than prescribed) but it is working well for her. She stopped taking lexapro as she feels it is making it hard to lose weight. She is noticing an increase in anxiety but it is manageable.  Health Maintenance - She has previously been weary to undergo a colonoscopy because she is hesitant to not eat prior to the procedure but is willing to undergo it since it hasn't been done before. She has had chronic diarrhea but no blood in stools that she has noticed.  [name] successful reduced smoking from 40 cigarettes a day to 18 cigarettes a day! We are very proud of this accomplishment and she is eager to continue to try and quit by October. She is working towards a goal of quitting so she can get much needed back surgery that her surgeon encouraged her towards.  She is also successfully losing weight with dieting and exercise.  Social/Emotional - [name] is doing better since she got a new pup. She had a rough patch when her previous dog died 1.5 years ago. She spoke with a psychiatrist which helped and her new pup is helping her get out and about and giving her joy. Her pup is a Insurance claims handler.  PERTINENT  PMH / PSH:  Essential Hypertension Thyroid nodule Primary osteoarthritis  OBJECTIVE:   BP (!) 141/94   Pulse 84   Wt 195 lb 6.4 oz (88.6 kg)   SpO2 98%   BMI 32.52 kg/m   GEN: Well appearing, in no acute distress HEENT: Sclera white, pupils symmetric  bilaterally. No tender lymphadenopathy. Thyroid normal, not enlarged CARD: Normal rate and rhythm. No murmurs or gallops PULM: Clear to auscultation, normal work of breathing GI: Soft, nontender to palpation. No distention. EXTREMITIES: No edema   ASSESSMENT/PLAN:   LOW BACK PAIN, CHRONIC Adjusted gabapentin dose to '300mg'$  per discussion at last visit with Dr. Higinio Plan. '400mg'$  makes her feel funny. Pain was previously well controlled on '300mg'$ . Will ensure next refill reflects this   Tobacco use disorder Sent referral for yearly low-dose CT lung screening. Successfully reduced smoking from 40 cigarettes to 18 cigarettes. Goal is to quit by October. Does not want any support for cessation at this time.  Thyroid nodule Sent referral for annual imaging for thyroid nodule.  ANXIETY DEPRESSION Recommended speaking with a therapist or counselor given recent pause on lexapro by choice (due to [name]'s concern of weight gain). Does not want additional support at this time. Will reevaluate anxiety at next visit.   Health Maintenance Sent referral for colonoscopy.    [place] Student   I was personally present and performed or re-performed the history, physical exam and medical decision making activities of this service and have verified that the service and findings are accurately documented in the student's note.  [name], DO  10/01/2020, 10:28 PM

## 2020-09-30 NOTE — Assessment & Plan Note (Addendum)
Sent referral for yearly low-dose CT lung screening. Successfully reduced smoking from 40 cigarettes to 18 cigarettes. Goal is to quit by October. Does not want any support for cessation at this time.

## 2020-10-01 ENCOUNTER — Telehealth: Payer: Self-pay | Admitting: *Deleted

## 2020-10-01 NOTE — Telephone Encounter (Signed)
Patient called and said that she missed a call from our office and she isn't sure who it was.  She thinks it was Dr. Arby Barrette regarding a referral to GI but there isn't a referral in her chart for this.  Will send to [place]

## 2020-10-04 NOTE — Telephone Encounter (Signed)
Third attempt to reach pt. No answer. Alexis Cole Holter, CMA

## 2020-10-04 NOTE — Telephone Encounter (Signed)
Attempted to call pt back. Called her last week to inform her of her us/ct appt on Friday at [facility]. Atilla Zollner Kennon Holter, CMA

## 2020-10-08 ENCOUNTER — Other Ambulatory Visit: Payer: Self-pay

## 2020-10-08 ENCOUNTER — Encounter (HOSPITAL_COMMUNITY): Payer: Self-pay

## 2020-10-08 ENCOUNTER — Ambulatory Visit (HOSPITAL_COMMUNITY)
Admission: RE | Admit: 2020-10-08 | Discharge: 2020-10-08 | Disposition: A | Discharge status: Home / Self Care | Payer: Medicare Other | Source: Ambulatory Visit | Attending: Family Medicine | Admitting: Family Medicine

## 2020-10-08 ENCOUNTER — Telehealth: Payer: Self-pay

## 2020-10-08 DIAGNOSIS — F17200 Nicotine dependence, unspecified, uncomplicated: Secondary | ICD-10-CM

## 2020-10-08 DIAGNOSIS — E041 Nontoxic single thyroid nodule: Secondary | ICD-10-CM | POA: Diagnosis not present

## 2020-10-08 DIAGNOSIS — E042 Nontoxic multinodular goiter: Secondary | ICD-10-CM | POA: Diagnosis not present

## 2020-10-08 DIAGNOSIS — H905 Unspecified sensorineural hearing loss: Secondary | ICD-10-CM | POA: Diagnosis not present

## 2020-10-08 NOTE — Telephone Encounter (Signed)
Sherri from [facility] calls nurse line regarding patient's CT order. Patient has a 16 pack year smoking history, however, needs to be 20 pack year for lung cancer screening.   Spoke with Dr.Paige who recommended discontinuing order.   Talbot Grumbling, RN

## 2020-10-28 ENCOUNTER — Other Ambulatory Visit: Payer: Self-pay

## 2020-10-28 ENCOUNTER — Ambulatory Visit (INDEPENDENT_AMBULATORY_CARE_PROVIDER_SITE_OTHER): Payer: Medicare Other | Admitting: Family Medicine

## 2020-10-28 DIAGNOSIS — L989 Disorder of the skin and subcutaneous tissue, unspecified: Secondary | ICD-10-CM | POA: Diagnosis not present

## 2020-10-28 DIAGNOSIS — L708 Other acne: Secondary | ICD-10-CM | POA: Insufficient documentation

## 2020-10-28 MED ORDER — METRONIDAZOLE 1 % EX GEL: Freq: Every day | CUTANEOUS | 0 refills | Status: DC

## 2020-10-28 NOTE — Progress Notes (Signed)
    SUBJECTIVE:   CHIEF COMPLAINT / HPI:   Perioral acne - Reports perioral acne worse with mask wearing - Excoriated portions due to picking with fingers and rubbing of mask against face - She reports she has had cystic acne for about the last 5 years and that she can feel cysts around her mouth as well  Skin check - She would like a full body skin check to make sure there are no concerning lesions - There are no skin lesions about which she is especially concerned, simply wants to check - No personal history of skin cancer or lesions removed  PERTINENT  PMH / PSH: HTN, primary OA, anxiety, depression, low back pain, HLD, hyperparathyroidism primary  OBJECTIVE:   BP (!) 153/81   Pulse 88   Ht '5\' 5"'$  (1.651 m)   Wt 190 lb 3.2 oz (86.3 kg)   SpO2 99%   BMI 31.65 kg/m   PHQ-9:  Depression screen [facility] 2/9 09/30/2020 07/26/2020 05/24/2020  Decreased Interest 1 0 1  Down, Depressed, Hopeless 1 0 1  PHQ - 2 Score 2 0 2  Altered sleeping '3 3 3  '$ Tired, decreased energy '1 2 2  '$ Change in appetite 0 1 1  Feeling bad or failure about yourself  0 0 0  Trouble concentrating '2 1 3  '$ Moving slowly or fidgety/restless 0 0 1  Suicidal thoughts 0 0 0  PHQ-9 Score '8 7 12  '$ Difficult doing work/chores Somewhat difficult Somewhat difficult -  Some recent data might be hidden     GAD-7:  GAD 7 : Generalized Anxiety Score 06/13/2019  Nervous, Anxious, on Edge 2  Control/stop worrying 1  Worry too much - different things 1  Trouble relaxing 0  Restless 0  Easily annoyed or irritable 0  Afraid - awful might happen 0  Total GAD 7 Score 4     Physical Exam General: Awake, alert, oriented, no acute distress Respiratory: Unlabored respirations, speaking in full sentences, no respiratory distress Skin: Diffuse sun spots over legs, arms, face; single SK of the center midline back, 1 slightly suspicious reddish light brown circular macule over midline chest (picture below); perioral area with  erythematous lines radiating from corners of mouth and scattered comedones, some excoriated portions         ASSESSMENT/PLAN:   Acne-like skin bumps Peri-oral acne.  Likely exacerbated by wearing masks, contact with fingers during smoking, and excoriating comedones.  Will prescribe metronidazole gel to apply over the affected area.  Also recommend Vaseline to help with moisture retention.  Mildly suspicious skin lesion of chest wall Possible AK of midline chest, however unsure at this time.  Pictured in clinic note and measured at about 6 mm.  Will elect for observation at this time.  Discussed with patient, gave return precautions and characteristics to watch out for.     Ezequiel Essex, MD [place]

## 2020-10-28 NOTE — Assessment & Plan Note (Signed)
Peri-oral acne.  Likely exacerbated by wearing masks, contact with fingers during smoking, and excoriating comedones.  Will prescribe metronidazole gel to apply over the affected area.  Also recommend Vaseline to help with moisture retention.

## 2020-10-28 NOTE — Assessment & Plan Note (Signed)
Possible AK of midline chest, however unsure at this time.  Pictured in clinic note and measured at about 6 mm.  Will elect for observation at this time.  Discussed with patient, gave return precautions and characteristics to watch out for.

## 2020-10-28 NOTE — Patient Instructions (Signed)
It was wonderful to meet you today. Thank you for allowing me to be a part of your care. Below is a short summary of what we discussed at your visit today:  Peri-oral acne - likely related to mask use - use the metronidazole gel daily over the affect areas - cover the area with vaseline prior to placing your mask  - let us know if it worsens or doesn't get better  Small skin lesion in front chest - We'll just keep an eye on the small red spot on your chest - Watch for growth or change, especially in color, size, or irregularity    If you have any questions or concerns, please do not hesitate to contact us via phone or MyChart message.   Ezequiel Essex, MD

## 2020-11-02 ENCOUNTER — Other Ambulatory Visit: Payer: Self-pay | Admitting: Family Medicine

## 2020-11-02 DIAGNOSIS — G4700 Insomnia, unspecified: Secondary | ICD-10-CM

## 2020-11-03 NOTE — Telephone Encounter (Signed)
Pt calling to let Dr. Arby Barrette know the Rx should be as follows:  Take 1 cap at breakfast and 1 at lunch and 1 at bedtime.  Pt requesting a 90 day refill. Ottis Stain, CMA

## 2020-11-05 DIAGNOSIS — H02834 Dermatochalasis of left upper eyelid: Secondary | ICD-10-CM | POA: Diagnosis not present

## 2020-11-05 DIAGNOSIS — Z01818 Encounter for other preprocedural examination: Secondary | ICD-10-CM | POA: Diagnosis not present

## 2020-11-05 DIAGNOSIS — H02831 Dermatochalasis of right upper eyelid: Secondary | ICD-10-CM | POA: Diagnosis not present

## 2020-11-09 DIAGNOSIS — M545 Low back pain, unspecified: Secondary | ICD-10-CM | POA: Diagnosis not present

## 2020-11-09 DIAGNOSIS — M5441 Lumbago with sciatica, right side: Secondary | ICD-10-CM | POA: Diagnosis not present

## 2020-11-09 DIAGNOSIS — M25561 Pain in right knee: Secondary | ICD-10-CM | POA: Diagnosis not present

## 2020-11-24 DIAGNOSIS — H02834 Dermatochalasis of left upper eyelid: Secondary | ICD-10-CM | POA: Diagnosis not present

## 2020-11-24 DIAGNOSIS — H02831 Dermatochalasis of right upper eyelid: Secondary | ICD-10-CM | POA: Diagnosis not present

## 2020-12-08 DIAGNOSIS — H353212 Exudative age-related macular degeneration, right eye, with inactive choroidal neovascularization: Secondary | ICD-10-CM | POA: Diagnosis not present

## 2020-12-08 DIAGNOSIS — H353221 Exudative age-related macular degeneration, left eye, with active choroidal neovascularization: Secondary | ICD-10-CM | POA: Diagnosis not present

## 2020-12-28 ENCOUNTER — Ambulatory Visit (INDEPENDENT_AMBULATORY_CARE_PROVIDER_SITE_OTHER): Payer: Medicare Other | Admitting: Family Medicine

## 2020-12-28 ENCOUNTER — Other Ambulatory Visit: Payer: Self-pay

## 2020-12-28 ENCOUNTER — Encounter: Payer: Self-pay | Admitting: Family Medicine

## 2020-12-28 VITALS — BP 138/70 | HR 92 | Temp 98.7°F | Ht 65.0 in | Wt 183.6 lb

## 2020-12-28 DIAGNOSIS — I10 Essential (primary) hypertension: Secondary | ICD-10-CM

## 2020-12-28 DIAGNOSIS — R634 Abnormal weight loss: Secondary | ICD-10-CM

## 2020-12-28 DIAGNOSIS — Z23 Encounter for immunization: Secondary | ICD-10-CM | POA: Diagnosis not present

## 2020-12-28 NOTE — Patient Instructions (Signed)
It was great seeing you today!  Today you came in for the flu vaccine. We also discussed getting your colonoscopy done, be sure to call the number on the form provided to schedule appointment.  Congratulations on your weight loss, keep up the great work!    Feel free to call with any questions or concerns at any time, at [phone].   Take care,  Dr. [name] [facility]

## 2020-12-28 NOTE — Progress Notes (Signed)
    SUBJECTIVE:   CHIEF COMPLAINT / HPI:   [name] is a 75 yo who presents to get her flu vaccine.  She endorses feeling well and without any particular concerns today.  She is very happy about her weight loss, stating that she is feeling better physically and mentally.  She states she has lost weight from doing intermittent fasting, and arthritis [place] chi.  She got a small dog, and that the dog is keeping her active as well.  She stopped taking her SSRI because she says her mood has significantly improved.   Smoking 18 cigarettes a day, down from 2 PPD. Not quite committed to quitting at this point in time.   BP 138/70 stable on amlodipine and lisinopril.   OBJECTIVE:   BP 138/70   Pulse 92   Temp 98.7 F (37.1 C)   Ht $R'5\' 5"'Ra$  (1.651 m)   Wt 183 lb 9.6 oz (83.3 kg)   SpO2 96%   BMI 30.55 kg/m    Physical exam  General: well appearing, NAD Cardiovascular: RRR, no murmurs Lungs: CTAB. Normal WOB Abdomen: soft, non-distended, non-tender Skin: warm, dry. No edema  ASSESSMENT/PLAN:   No problem-specific Assessment & Plan notes found for this encounter.   HTN BP 138/70. Asymptomatic. Stable on amlodipine $RemoveBefor'5mg'ZIIlMmSklopa$  and lisinopril $RemoveBefore'20mg'bPnQAbTJYnWCO$   Obesity/ Weight loss  BMI 30.55.  Patient has lost 6 pounds in the last 2 months, and almost 30 pounds in the past year and a half.  Doing intermittent fasting, and getting in physical activity with tai chi.  Discussed making sure she is getting enough protein with her meals.  Feels better mentally and physically.  Congratulated patient and encouraged her to keep up the great work.  Health maintenance Flu vaccine administered today Need for colonoscopy - gave form to schedule appointment   [place]

## 2021-01-04 ENCOUNTER — Telehealth: Payer: Self-pay | Admitting: *Deleted

## 2021-01-04 NOTE — Telephone Encounter (Signed)
Patient called requesting a referral for GI be sent to [facility] for her colonoscopy.  Will forward to MD to place this referral.  Johnney Ou

## 2021-01-05 ENCOUNTER — Other Ambulatory Visit: Payer: Self-pay | Admitting: Family Medicine

## 2021-01-05 DIAGNOSIS — Z1211 Encounter for screening for malignant neoplasm of colon: Secondary | ICD-10-CM

## 2021-01-05 NOTE — Progress Notes (Signed)
Placed referral to [facility] for colonoscopy per patient's request

## 2021-01-19 ENCOUNTER — Other Ambulatory Visit: Payer: Self-pay | Admitting: Family Medicine

## 2021-01-19 DIAGNOSIS — I10 Essential (primary) hypertension: Secondary | ICD-10-CM

## 2021-02-23 DIAGNOSIS — H353221 Exudative age-related macular degeneration, left eye, with active choroidal neovascularization: Secondary | ICD-10-CM | POA: Diagnosis not present

## 2021-02-23 DIAGNOSIS — H353212 Exudative age-related macular degeneration, right eye, with inactive choroidal neovascularization: Secondary | ICD-10-CM | POA: Diagnosis not present

## 2021-03-04 ENCOUNTER — Other Ambulatory Visit: Payer: Self-pay | Admitting: Family Medicine

## 2021-03-04 DIAGNOSIS — J301 Allergic rhinitis due to pollen: Secondary | ICD-10-CM

## 2021-03-14 ENCOUNTER — Other Ambulatory Visit: Payer: Self-pay

## 2021-03-14 DIAGNOSIS — G4700 Insomnia, unspecified: Secondary | ICD-10-CM

## 2021-03-16 MED ORDER — GABAPENTIN 300 MG PO CAPS: 300.0000 mg | ORAL_CAPSULE | Freq: Three times a day (TID) | ORAL | 3 refills | Status: DC

## 2021-04-05 DIAGNOSIS — M25551 Pain in right hip: Secondary | ICD-10-CM | POA: Diagnosis not present

## 2021-04-05 DIAGNOSIS — M5441 Lumbago with sciatica, right side: Secondary | ICD-10-CM | POA: Diagnosis not present

## 2021-04-19 ENCOUNTER — Other Ambulatory Visit: Payer: Self-pay

## 2021-04-19 ENCOUNTER — Ambulatory Visit (INDEPENDENT_AMBULATORY_CARE_PROVIDER_SITE_OTHER): Payer: Medicare Other

## 2021-04-19 VITALS — BP 148/82 | HR 76 | Ht 65.0 in | Wt 166.0 lb

## 2021-04-19 DIAGNOSIS — Z0000 Encounter for general adult medical examination without abnormal findings: Secondary | ICD-10-CM | POA: Diagnosis not present

## 2021-04-19 NOTE — Progress Notes (Addendum)
Subjective:   Alexis Cole is a 76 y.o. female who presents for Medicare Annual (Subsequent) preventive examination.  Review of Systems: Defer to PCP.  Cardiac Risk Factors include: advanced age (>16men, >2 women);hypertension;dyslipidemia;smoking/ tobacco exposure  Objective:   Vitals: BP (!) 148/82    Pulse 76    Ht 5\' 5"  (1.651 m)    Wt 166 lb (75.3 kg)    SpO2 98%    BMI 27.62 kg/m   Body mass index is 27.62 kg/m.  Advanced Directives 04/19/2021 09/30/2020 07/26/2020 05/24/2020 07/18/2019 07/10/2019 03/18/2019  Does Patient Have a Medical Advance Directive? No No No No No No No  Would patient like information on creating a medical advance directive? Yes (MAU/Ambulatory/Procedural Areas - Information given) No - Patient declined No - Patient declined No - Patient declined No - Patient declined Yes (MAU/Ambulatory/Procedural Areas - Information given) No - Patient declined  Pre-existing out of facility DNR order (yellow form or pink MOST form) - - - - - - -   Tobacco Social History   Tobacco Use  Smoking Status Every Day   Packs/day: 0.50   Years: 15.00   Pack years: 7.50   Types: Cigarettes   Start date: 02/21/1968   Passive exposure: Current  Smokeless Tobacco Never  Tobacco Comments   quit in the past for 9 years.  Previous 1ppd     Ready to quit: No Counseling given: Yes Tobacco comments: quit in the past for 9 years.  Previous 1ppd  Clinical Intake:  Pre-visit preparation completed: Yes  Pain Score: 7   Nutritional Status: BMI 25 -29 Overweight  How often do you need to have someone help you when you read instructions, pamphlets, or other written materials from your doctor or pharmacy?: 2 - Rarely What is the last grade level you completed in school?: High School  Past Medical History:  Diagnosis Date   Acne    but not being treated for   Acute recurrent sinusitis 08/13/2014   Anemia    history of--many yrs ago   Anxiety    but doesn't take any meds for this    Aortic atherosclerosis ([place]) 06/08/2016   noted on CT abd/pelvis   Arthritis    Knees, hips back, shoulder   Arthropathy, lower leg 02/10/2009   For eye care pt is going to Dr. Katy Fitch, and her Ortho surgeon is Dr. Dorna Leitz and [facility].     Back pain 11/17/2015   Breast mass in female 08/20/2014   IMPRESSION: Further evaluation is suggested for possible mass in the left breast.The patient will be contacted regarding the findings, and additional imaging will be scheduled.  BI-RADS CATEGORY  0: Incomplete. Need additional imaging evaluation and/or prior mammograms for comparison.    Chronic back pain    Complication of anesthesia    States she woke up just after parathyroid surgery while still in OR and attempted to get off the bed, remebers seeing staffs back towards her   Depression    Diarrhea    Diverticulitis 06/08/2016   noted on CT abd/pelvis   Diverticulosis 06/08/2016   noted on CT abd/pelvis   Dyslipidemia    Dyspnea 10/26/2017   Estrogen deficiency 08/13/2014   Fatigue 02/24/2016   Fatty liver 06/08/2016   severe noted on CT abd/pelvis    Gastric ulcer    history of--many yrs ago   General weakness 06/08/2016   Headache(784.0)    History of migraine   Heart murmur  History of bronchitis    last time 50months   History of syncope    Hx of migraines    last one a month ago   Hypercalcemia 12/12/2016   Hyperparathyroidism ([place]) 12/12/2016   Hypertension     Dr     Higinio Plan   Increased endometrial stripe thickness 06/13/2016   Insomnia    Nyquil nightly   Joint pain    Joint swelling    Lactose intolerance    Nausea and vomiting    Obesity    Pneumonia 03/15/2016   left lower lobe, left lower lobe atelectasis, elevated left hemidiaphragm noted again, small left pleural effusion   Pre-syncope 04/12/2016   PTSD (post-traumatic stress disorder)    Saddle anesthesia 06/28/2018   Sinusitis    Urge incontinence of urine    Past Surgical History:  Procedure  Laterality Date   CATARACT EXTRACTION, BILATERAL     CERVICAL CONE BIOPSY     DILATION AND CURETTAGE OF UTERUS  30+yrs ago   MOUTH SURGERY     NASAL SEPTUM SURGERY  40+yrs ago   PARATHYROIDECTOMY Left 01/04/2017   Procedure: LEFT SUPERIOR PARATHYROIDECTOMY;  Surgeon: Armandina Gemma, MD;  Location: [facility];  Service: General;  Laterality: Left;   TOTAL HIP ARTHROPLASTY  09/01/2011   Procedure: TOTAL HIP ARTHROPLASTY;  Surgeon: [name], MD;  Location: [facility];  Service: Orthopedics;  Laterality: Right;   TOTAL HIP ARTHROPLASTY Left 07/26/2018   Procedure: TOTAL HIP ARTHROPLASTY ANTERIOR APPROACH;  Surgeon: Dorna Leitz, MD;  Location: [facility];  Service: Orthopedics;  Laterality: Left;   TOTAL KNEE ARTHROPLASTY Left 03/09/2017   Procedure: LEFT TOTAL KNEE ARTHROPLASTY;  Surgeon: Dorna Leitz, MD;  Location: [facility];  Service: Orthopedics;  Laterality: Left;   TOTAL KNEE ARTHROPLASTY Right 07/18/2019   Procedure: TOTAL KNEE ARTHROPLASTY;  Surgeon: Dorna Leitz, MD;  Location: [facility];  Service: Orthopedics;  Laterality: Right;   TUBAL LIGATION  30+yrs ago   Family History  Problem Relation Age of Onset   Hypertension Mother    Arthritis-Osteo Mother    Irritable bowel syndrome Mother    Cancer - Prostate Father    High blood pressure Brother    High blood pressure Son    Diabetes Mellitus II Maternal Grandmother    Heart attack Maternal Grandfather    Hyperlipidemia Neg Hx    Diabetes Neg Hx    Sudden death Neg Hx    Breast cancer Neg Hx    Social History   Socioeconomic History   Marital status: Divorced    Spouse name: Not on file   Number of children: 1   Years of education: 12   Highest education level: High school graduate  Occupational History   Occupation: Emergency planning/management officer     Comment: retired    Occupation: Customer service manager     Comment: retired   Tobacco Use   Smoking status: Every Day    Packs/day: 0.50    Years: 15.00    Pack years: 7.50    Types: Cigarettes    Start date:  02/21/1968    Passive exposure: Current   Smokeless tobacco: Never   Tobacco comments:    quit in the past for 9 years.  Previous 1ppd  Vaping Use   Vaping Use: Never used  Substance and Sexual Activity   Alcohol use: No    Alcohol/week: 0.0 standard drinks   Drug use: No   Sexual activity: Not Currently    Birth control/protection:  Post-menopausal  Other Topics Concern   Not on file  Social History Narrative   Patient lives alone in [place].    The patient does have a dog named Cardio who brings her great joy.   Patients only child, a son, lives out in [place]. They talk on the phone several times a week.    The patient is Buddhist and practices daily meditation.    The patient enjoys walking her dog, zumba classes and swimming.        Social Determinants of Health   Financial Resource Strain: Low Risk    Difficulty of Paying Living Expenses: Not hard at all  Food Insecurity: No Food Insecurity   Worried About Charity fundraiser in the Last Year: Never true   [place] in the Last Year: Never true  Transportation Needs: No Transportation Needs   Lack of Transportation (Medical): No   Lack of Transportation (Non-Medical): No  Physical Activity: Sufficiently Active   Days of Exercise per Week: 7 days   Minutes of Exercise per Session: 60 min  Stress: Stress Concern Present   Feeling of Stress : To some extent  Social Connections: Socially Isolated   Frequency of Communication with Friends and Family: Three times a week   Frequency of Social Gatherings with Friends and Family: More than three times a week   Attends Religious Services: Never   Marine scientist or Organizations: No   Attends Archivist Meetings: Never   Marital Status: Divorced   Outpatient Encounter Medications as of 04/19/2021  Medication Sig   amLODipine (NORVASC) 5 MG tablet TAKE 1 TABLET (5 MG TOTAL) BY MOUTH DAILY.   atorvastatin (LIPITOR) 40 MG tablet Take 1 tablet (40 mg  total) by mouth daily.   BIOTIN PO Take 500 mcg by mouth at bedtime.    Cholecalciferol (VITAMIN D3) 50 MCG (2000 UT) capsule Take 2,000 Units by mouth at bedtime.    Coenzyme Q10 (CO Q-10) 300 MG CAPS Take 300 mg by mouth at bedtime.    fluticasone (FLONASE) 50 MCG/ACT nasal spray PLACE 1 SPRAY INTO BOTH NOSTRILS DAILY AS NEEDED FOR ALLERGIES OR RHINITIS.   folic acid (FOLVITE) 194 MCG tablet Take 400 mcg by mouth at bedtime.    gabapentin (NEURONTIN) 300 MG capsule Take 1 capsule (300 mg total) by mouth 3 (three) times daily. Take 1 capsule with breakfast, lunch, and at bedtime   Krill Oil 350 MG CAPS Take 350 mg by mouth at bedtime.    lisinopril (ZESTRIL) 20 MG tablet TAKE 1 TABLET BY MOUTH EVERY DAY   Multiple Vitamins-Minerals (HEALTHY EYES PO) Take 1 tablet by mouth at bedtime.   tiZANidine (ZANAFLEX) 2 MG tablet Take 1 tablet (2 mg total) by mouth every 8 (eight) hours as needed for muscle spasms.   albuterol (VENTOLIN HFA) 108 (90 Base) MCG/ACT inhaler INHALE 2-4 PUFFS INTO THE LUNGS EVERY 4 HOURS AS NEEDED FOR WHEEZING (OR COUGH). (Patient not taking: Reported on 04/19/2021)   aspirin EC 325 MG tablet Take 1 tablet (325 mg total) by mouth 2 (two) times daily after a meal. Take x 1 month post op to decrease risk of blood clots. (Patient not taking: Reported on 09/30/2020)   busPIRone (BUSPAR) 10 MG tablet TAKE 1 TABLET BY MOUTH TWICE A DAY   docusate sodium (COLACE) 100 MG capsule Take 1 capsule (100 mg total) by mouth 2 (two) times daily. (Patient not taking: Reported on 12/28/2020)   escitalopram (  LEXAPRO) 5 MG tablet TAKE 1 TABLET BY MOUTH EVERY DAY (Patient not taking: Reported on 09/30/2020)   metroNIDAZOLE (METROGEL) 1 % gel Apply topically daily. (Patient not taking: Reported on 04/19/2021)   No facility-administered encounter medications on file as of 04/19/2021.   Activities of Daily Living In your present state of health, do you have any difficulty performing the following  activities: 04/19/2021  Hearing? Y  Comment hearing aides  Vision? N  Difficulty concentrating or making decisions? Y  Walking or climbing stairs? N  Dressing or bathing? N  Doing errands, shopping? N  Preparing Food and eating ? N  Using the Toilet? N  In the past six months, have you accidently leaked urine? N  Do you have problems with loss of bowel control? N  Managing your Medications? N  Managing your Finances? N  Housekeeping or managing your Housekeeping? N  Some recent data might be hidden   Patient Care Team: [name], DO as PCP - General (Family Medicine) Dorna Leitz, MD as Consulting Physician (Orthopedic Surgery)    Assessment:   This is a routine wellness examination for Alexis Cole.  Exercise Activities and Dietary recommendations Current Exercise Habits: Structured exercise class, Type of exercise: strength training/weights;walking, Time (Minutes): 60, Frequency (Times/Week): 7, Weekly Exercise (Minutes/Week): 420, Intensity: Moderate, Exercise limited by: respiratory conditions(s);cardiac condition(s);orthopedic condition(s)   Goals      Blood Pressure < 150/90     Quit smoking / using tobacco     Patient has been smoking 1/2PPD due to covid.     Weight (lb) < 180 lb (81.6 kg)     Patient has lost 8 pounds and wants to lose another 8 more.        Fall Risk Fall Risk  04/19/2021 09/30/2020 07/26/2020 09/22/2019 03/18/2019  Falls in the past year? 0 0 0 0 0  Number falls in past yr: 0 0 0 0 0  Injury with Fall? 0 0 0 0 -  Risk for fall due to : No Fall Risks - - - -  Follow up Falls prevention discussed - - - -   Normal gait and balance. Patient does not use assistive devices to ambulate.   Is the patient's home free of loose throw rugs in walkways, pet beds, electrical cords, etc?   yes      Grab bars in the bathroom? yes      Handrails on the stairs?   yes      Adequate lighting?   yes  Patient rating of health (0-10) scale: 10   Depression  Screen PHQ 2/9 Scores 04/19/2021 09/30/2020 07/26/2020 05/24/2020  PHQ - 2 Score 2 2 0 2  PHQ- 9 Score 8 8 7 12     Cognitive Function 6CIT Screen 04/19/2021 09/09/2018  What Year? 0 points 0 points  What month? 0 points 0 points  What time? 0 points 0 points  Count back from 20 0 points 0 points  Months in reverse 0 points 0 points  Repeat phrase 0 points 0 points  Total Score 0 0   Immunization History  Administered Date(s) Administered   Fluad Quad(high Dose 65+) 12/28/2020   Influenza,inj,Quad PF,6+ Mos 01/03/2013, 04/17/2014, 11/17/2015, 10/25/2016, 10/25/2017, 11/05/2018, 11/26/2019   PFIZER Comirnaty(Gray Top)Covid-19 Tri-Sucrose Vaccine 05/28/2020   PFIZER(Purple Top)SARS-COV-2 Vaccination 04/15/2019, 05/06/2019, 01/11/2020   Pfizer Covid-19 Vaccine Bivalent Booster 69yrs & up 11/12/2020   Pneumococcal Conjugate-13 04/17/2014   Pneumococcal Polysaccharide-23 09/04/2011   Tdap 08/13/2014   Zoster Recombinat (  Shingrix) 10/07/2019, 11/21/2019   Screening Tests Health Maintenance  Topic Date Due   COLONOSCOPY (Pts 45-34yrs Insurance coverage will need to be confirmed)  Never done   COLON CANCER SCREENING ANNUAL FOBT  11/05/2019   MAMMOGRAM  08/14/2022   TETANUS/TDAP  08/12/2024   Pneumonia Vaccine 77+ Years old  Completed   INFLUENZA VACCINE  Completed   DEXA SCAN  Completed   COVID-19 Vaccine  Completed   Hepatitis C Screening  Completed   Zoster Vaccines- Shingrix  Completed   HPV VACCINES  Aged Out   Cancer Screenings: Lung: Low Dose CT Chest recommended if Age 31-80 years, 20 pack-year currently smoking OR have quit w/in 15years. Patient does qualify. Has been ordered. Education provided to patient.   Breast:  Up to date on Mammogram? Yes   Up to date of Bone Density/Dexa? Yes Colorectal: Colonoscopy never done. FOBT 11/05/2018 [facility]  Additional Screenings: Hepatitis C Screening: Completed  HIV Screening: Completed   Plan:  PCP apt scheduled for 4/5. Referral for  colonoscopy placed. Please call [facility] [phone].  I have personally reviewed and noted the following in the patients chart:   Medical and social history Use of alcohol, tobacco or illicit drugs  Current medications and supplements Functional ability and status Nutritional status Physical activity Advanced directives List of other physicians Hospitalizations, surgeries, and ER visits in previous 12 months Vitals Screenings to include cognitive, depression, and falls Referrals and appointments  In addition, I have reviewed and discussed with patient certain preventive protocols, quality metrics, and best practice recommendations. A written personalized care plan for preventive services as well as general preventive health recommendations were provided to patient.  Dorna Bloom, [place]  04/21/2021    I have reviewed this visit and agree with the documentation.

## 2021-04-20 DIAGNOSIS — H353221 Exudative age-related macular degeneration, left eye, with active choroidal neovascularization: Secondary | ICD-10-CM | POA: Diagnosis not present

## 2021-04-21 NOTE — Patient Instructions (Signed)
You spoke to Alexis Cole, [place] for your annual wellness visit.  We discussed goals:   Goals      Blood Pressure < 150/90     Quit smoking / using tobacco     Patient has been smoking 1/2PPD due to covid.     Weight (lb) < 180 lb (81.6 kg)     Patient has lost 8 pounds and wants to lose another 8 more.        We also discussed recommended health maintenance. As discussed, you are due for: Health Maintenance  Topic Date Due   COLONOSCOPY (Pts 45-59yr Insurance coverage will need to be confirmed)  Never done   COLON CANCER SCREENING ANNUAL FOBT  11/05/2019   MAMMOGRAM  08/14/2022   TETANUS/TDAP  08/12/2024   Pneumonia Vaccine 75 Years old  Completed   INFLUENZA VACCINE  Completed   DEXA SCAN  Completed   COVID-19 Vaccine  Completed   Hepatitis C Screening  Completed   Zoster Vaccines- Shingrix  Completed   HPV VACCINES  Aged Out   PCP apt scheduled for 4/5. Referral for colonoscopy placed. Please call [facility] [phone]  We also discussed smoking cessation.  Call 1800-QUIT-NOW for help with stopping smoking.   Preventive Care 687Years and Older, Female Preventive care refers to lifestyle choices and visits with your health care provider that can promote health and wellness. Preventive care visits are also called wellness exams. What can I expect for my preventive care visit? Counseling Your health care provider may ask you questions about your: Medical history, including: Past medical problems. Family medical history. Pregnancy and menstrual history. History of falls. Current health, including: Memory and ability to understand (cognition). Emotional well-being. Home life and relationship well-being. Sexual activity and sexual health. Lifestyle, including: Alcohol, nicotine or tobacco, and drug use. Access to firearms. Diet, exercise, and sleep habits. Work and work eStatistician Sunscreen use. Safety issues such as seatbelt and bike helmet  use. Physical exam Your health care provider will check your: Height and weight. These may be used to calculate your BMI (body mass index). BMI is a measurement that tells if you are at a healthy weight. Waist circumference. This measures the distance around your waistline. This measurement also tells if you are at a healthy weight and may help predict your risk of certain diseases, such as type 2 diabetes and high blood pressure. Heart rate and blood pressure. Body temperature. Skin for abnormal spots. What immunizations do I need? Vaccines are usually given at various ages, according to a schedule. Your health care provider will recommend vaccines for you based on your age, medical history, and lifestyle or other factors, such as travel or where you work. What tests do I need? Screening Your health care provider may recommend screening tests for certain conditions. This may include: Lipid and cholesterol levels. Hepatitis C test. Hepatitis B test. HIV (human immunodeficiency virus) test. STI (sexually transmitted infection) testing, if you are at risk. Lung cancer screening. Colorectal cancer screening. Diabetes screening. This is done by checking your blood sugar (glucose) after you have not eaten for a while (fasting). Mammogram. Talk with your health care provider about how often you should have regular mammograms. BRCA-related cancer screening. This may be done if you have a family history of breast, ovarian, tubal, or peritoneal cancers. Bone density scan. This is done to screen for osteoporosis. Talk with your health care provider about your test results, treatment options, and if necessary, the  need for more tests. Follow these instructions at home: Eating and drinking  Eat a diet that includes fresh fruits and vegetables, whole grains, lean protein, and low-fat dairy products. Limit your intake of foods with high amounts of sugar, saturated fats, and salt. Take vitamin and  mineral supplements as recommended by your health care provider. Do not drink alcohol if your health care provider tells you not to drink. If you drink alcohol: Limit how much you have to 0-1 drink a day. Know how much alcohol is in your drink. In the [place] one drink equals one 12 oz bottle of beer (355 mL), one 5 oz glass of wine (148 mL), or one 1 oz glass of hard liquor (44 mL). Lifestyle Brush your teeth every morning and night with fluoride toothpaste. Floss one time each day. Exercise for at least 30 minutes 5 or more days each week. Do not use any products that contain nicotine or tobacco. These products include cigarettes, chewing tobacco, and vaping devices, such as e-cigarettes. If you need help quitting, ask your health care provider. Do not use drugs. If you are sexually active, practice safe sex. Use a condom or other form of protection in order to prevent STIs. Take aspirin only as told by your health care provider. Make sure that you understand how much to take and what form to take. Work with your health care provider to find out whether it is safe and beneficial for you to take aspirin daily. Ask your health care provider if you need to take a cholesterol-lowering medicine (statin). Find healthy ways to manage stress, such as: Meditation, yoga, or listening to music. Journaling. Talking to a trusted person. Spending time with friends and family. Minimize exposure to UV radiation to reduce your risk of skin cancer. Safety Always wear your seat belt while driving or riding in a vehicle. Do not drive: If you have been drinking alcohol. Do not ride with someone who has been drinking. When you are tired or distracted. While texting. If you have been using any mind-altering substances or drugs. Wear a helmet and other protective equipment during sports activities. If you have firearms in your house, make sure you follow all gun safety procedures. What's next? Visit your  health care provider once a year for an annual wellness visit. Ask your health care provider how often you should have your eyes and teeth checked. Stay up to date on all vaccines. This information is not intended to replace advice given to you by your health care provider. Make sure you discuss any questions you have with your health care provider. Document Revised: 08/04/2020 Document Reviewed: 08/04/2020 Elsevier Patient Education  2022 [place] [facility]'s number is [phone]. Please call with questions or concerns about what we discussed today.

## 2021-04-25 ENCOUNTER — Other Ambulatory Visit: Payer: Self-pay | Admitting: Family Medicine

## 2021-05-02 ENCOUNTER — Other Ambulatory Visit: Payer: Self-pay

## 2021-05-02 DIAGNOSIS — I10 Essential (primary) hypertension: Secondary | ICD-10-CM

## 2021-05-02 MED ORDER — LISINOPRIL 20 MG PO TABS: 20.0000 mg | ORAL_TABLET | Freq: Every day | ORAL | 3 refills | Status: DC

## 2021-05-02 MED ORDER — ATORVASTATIN CALCIUM 40 MG PO TABS: 40.0000 mg | ORAL_TABLET | Freq: Every day | ORAL | 3 refills | Status: DC

## 2021-05-25 ENCOUNTER — Encounter: Payer: Self-pay | Admitting: Family Medicine

## 2021-05-25 ENCOUNTER — Other Ambulatory Visit: Payer: Self-pay

## 2021-05-25 ENCOUNTER — Ambulatory Visit (INDEPENDENT_AMBULATORY_CARE_PROVIDER_SITE_OTHER): Payer: Medicare Other | Admitting: Family Medicine

## 2021-05-25 VITALS — BP 139/74 | HR 81 | Wt 165.0 lb

## 2021-05-25 DIAGNOSIS — F341 Dysthymic disorder: Secondary | ICD-10-CM

## 2021-05-25 DIAGNOSIS — E041 Nontoxic single thyroid nodule: Secondary | ICD-10-CM | POA: Diagnosis not present

## 2021-05-25 DIAGNOSIS — E210 Primary hyperparathyroidism: Secondary | ICD-10-CM

## 2021-05-25 NOTE — Progress Notes (Signed)
? ? ?  SUBJECTIVE:  ? ?CHIEF COMPLAINT / HPI:  ? ?Alexis Cole is a 76 yo who presents to check her thyroid levels. She has a thyroid nodule that requires annual imaging. Her last [place] was in August 2022 which showed multinodular thyroid gland with nodules measuring similar to previous US one year ago. Follow-up ultrasound was recommended in 1 year for a total surveillance period of 5 years. ?Last TSH Oct 2021 and was 0.906. Denies any current symptoms  ? ?Interested in getting bone density scan  ? ?States her diet is going great and still stays active with aeorbics, [place], walking dogs 4x a week  ?Has been eating one meal a day and a small snack earlier in the day.  ?States weight has been coming off since aerobics class  ?April 23rd 2021 was 212.9 currently 165  ? ?Has an assistance animal for her anxiety. She requests another letter  ?Dog helps with helping her up, can help brace her when doing yoga  ?Will bring her leash to help her out of the tub  ?Needs letter for emotional support  ? ? ?OBJECTIVE:  ? ?BP 139/74   Pulse 81   Wt 165 lb (74.8 kg)   SpO2 98%   BMI 27.46 kg/m?   ? ?Physical exam ?General: well appearing, NAD ?Cardiovascular: RRR, no murmurs ?Lungs: CTAB. Normal WOB ?Abdomen: soft, non-distended, non-tender ?Skin: warm, dry. No edema ? ?ASSESSMENT/PLAN:  ? ?Thyroid nodule ?Stable, as last [place] was in August 2022 which showed multinodular thyroid gland with nodules measuring similar to previous US one year ago. Follow-up ultrasound was recommended in 1 year for a total surveillance period of 5 years.. Last TSH Oct 2021 and was 0.906. Currently feels well and without any symptoms but is requesting recheck. ?- f/u TSH ? ?Health maintenance  ?- provided information to schedule bone density scan  ?- discussed continued diet and exercise ? ?Misc ?Letter provided for support dog  ? ?[name], DO ?[place]   ?

## 2021-05-25 NOTE — Patient Instructions (Signed)
It was great seeing you today! ? ?Today we are checking your thyroid function, and I have also provided a note for your emotional and physical support dog. ? ?I have also provided you with the information to your bone density test. ? ?I will call you if any results are abnormal. ?  ?If you haven't already, sign up for My Chart to have easy access to your labs results, and communication with your primary care physician. ? ?Feel free to call with any questions or concerns at any time, at [phone]. ?  ?Take care,  ?Dr. [name] ?[place]  ?

## 2021-05-26 LAB — TSH: TSH: 1.14 u[IU]/mL (ref 0.450–4.500)

## 2021-05-26 NOTE — Assessment & Plan Note (Signed)
Stable, as last [place] was in August 2022 which showed multinodular thyroid gland with nodules measuring similar to previous US one year ago. Follow-up ultrasound was recommended in 1 year for a total surveillance period of 5 years.. Last TSH Oct 2021 and was 0.906. Currently feels well and without any symptoms but is requesting recheck. ?- f/u TSH ?

## 2021-06-01 ENCOUNTER — Other Ambulatory Visit: Payer: Self-pay | Admitting: Family Medicine

## 2021-06-01 ENCOUNTER — Telehealth: Payer: Self-pay | Admitting: *Deleted

## 2021-06-01 DIAGNOSIS — H353221 Exudative age-related macular degeneration, left eye, with active choroidal neovascularization: Secondary | ICD-10-CM | POA: Diagnosis not present

## 2021-06-01 NOTE — Telephone Encounter (Signed)
Patient called and states that she tried to schedule the bone density at the breast center but they didn't have an order for her.  Will forward to MD to place this order and patient will call them back this week to schedule it.  If the patient needs: ? ? An appointment - route response to [facility] admin ? A callback from clinic staff - route response to your team ? ? ?Only route to RN Team: form completions or if RN team directly requests response sent to them ? ? ?Everado Pillsbury, CMA ? ? ? ?

## 2021-06-02 ENCOUNTER — Encounter: Payer: Self-pay | Admitting: Family Medicine

## 2021-06-03 ENCOUNTER — Other Ambulatory Visit: Payer: Self-pay | Admitting: Family Medicine

## 2021-06-03 DIAGNOSIS — Z9189 Other specified personal risk factors, not elsewhere classified: Secondary | ICD-10-CM

## 2021-06-06 ENCOUNTER — Other Ambulatory Visit: Payer: Self-pay | Admitting: *Deleted

## 2021-06-06 DIAGNOSIS — F341 Dysthymic disorder: Secondary | ICD-10-CM

## 2021-06-06 MED ORDER — BUSPIRONE HCL 10 MG PO TABS: 10.0000 mg | ORAL_TABLET | Freq: Two times a day (BID) | ORAL | 3 refills | Status: DC

## 2021-06-10 DIAGNOSIS — M47816 Spondylosis without myelopathy or radiculopathy, lumbar region: Secondary | ICD-10-CM | POA: Diagnosis not present

## 2021-06-10 DIAGNOSIS — F17210 Nicotine dependence, cigarettes, uncomplicated: Secondary | ICD-10-CM | POA: Diagnosis not present

## 2021-06-15 NOTE — Telephone Encounter (Signed)
Bone density ordered 06-03-21. Alexis Cole,CMA ? ?

## 2021-06-30 DIAGNOSIS — M47816 Spondylosis without myelopathy or radiculopathy, lumbar region: Secondary | ICD-10-CM | POA: Diagnosis not present

## 2021-07-12 DIAGNOSIS — D0472 Carcinoma in situ of skin of left lower limb, including hip: Secondary | ICD-10-CM | POA: Diagnosis not present

## 2021-07-12 DIAGNOSIS — D0462 Carcinoma in situ of skin of left upper limb, including shoulder: Secondary | ICD-10-CM | POA: Diagnosis not present

## 2021-07-12 DIAGNOSIS — L700 Acne vulgaris: Secondary | ICD-10-CM | POA: Diagnosis not present

## 2021-07-25 ENCOUNTER — Other Ambulatory Visit: Payer: Self-pay | Admitting: Family Medicine

## 2021-07-25 DIAGNOSIS — I10 Essential (primary) hypertension: Secondary | ICD-10-CM

## 2021-07-27 ENCOUNTER — Other Ambulatory Visit: Payer: Self-pay | Admitting: Family Medicine

## 2021-07-27 DIAGNOSIS — H353221 Exudative age-related macular degeneration, left eye, with active choroidal neovascularization: Secondary | ICD-10-CM | POA: Diagnosis not present

## 2021-07-27 DIAGNOSIS — G4700 Insomnia, unspecified: Secondary | ICD-10-CM

## 2021-08-01 DIAGNOSIS — M47816 Spondylosis without myelopathy or radiculopathy, lumbar region: Secondary | ICD-10-CM | POA: Diagnosis not present

## 2021-08-01 DIAGNOSIS — F17210 Nicotine dependence, cigarettes, uncomplicated: Secondary | ICD-10-CM | POA: Diagnosis not present

## 2021-08-03 DIAGNOSIS — H353221 Exudative age-related macular degeneration, left eye, with active choroidal neovascularization: Secondary | ICD-10-CM | POA: Diagnosis not present

## 2021-09-08 ENCOUNTER — Telehealth: Payer: Self-pay

## 2021-09-08 NOTE — Telephone Encounter (Signed)
Patient called nurse line requesting that we let Dr. Arby Barrette know that she was unable to have colonoscopy today due to transportation issues.   She will call to reschedule appointment for colonoscopy once she has a reliable method of transportation.   Talbot Grumbling, RN

## 2021-09-10 DIAGNOSIS — Z23 Encounter for immunization: Secondary | ICD-10-CM | POA: Diagnosis not present

## 2021-09-23 DIAGNOSIS — H353221 Exudative age-related macular degeneration, left eye, with active choroidal neovascularization: Secondary | ICD-10-CM | POA: Diagnosis not present

## 2021-10-20 DIAGNOSIS — L905 Scar conditions and fibrosis of skin: Secondary | ICD-10-CM | POA: Diagnosis not present

## 2021-10-20 DIAGNOSIS — Z85828 Personal history of other malignant neoplasm of skin: Secondary | ICD-10-CM | POA: Diagnosis not present

## 2021-10-20 DIAGNOSIS — L821 Other seborrheic keratosis: Secondary | ICD-10-CM | POA: Diagnosis not present

## 2021-10-20 DIAGNOSIS — D225 Melanocytic nevi of trunk: Secondary | ICD-10-CM | POA: Diagnosis not present

## 2021-10-27 ENCOUNTER — Other Ambulatory Visit: Payer: Self-pay

## 2021-10-27 DIAGNOSIS — G4700 Insomnia, unspecified: Secondary | ICD-10-CM

## 2021-10-27 MED ORDER — GABAPENTIN 300 MG PO CAPS: 300.0000 mg | ORAL_CAPSULE | Freq: Three times a day (TID) | ORAL | 3 refills | Status: DC

## 2021-11-10 ENCOUNTER — Encounter: Payer: Self-pay | Admitting: Family Medicine

## 2021-11-10 ENCOUNTER — Ambulatory Visit (INDEPENDENT_AMBULATORY_CARE_PROVIDER_SITE_OTHER): Payer: Medicare Other | Admitting: Family Medicine

## 2021-11-10 VITALS — BP 149/84 | HR 73 | Ht 65.0 in | Wt 153.4 lb

## 2021-11-10 DIAGNOSIS — Z23 Encounter for immunization: Secondary | ICD-10-CM | POA: Diagnosis not present

## 2021-11-10 DIAGNOSIS — Z1211 Encounter for screening for malignant neoplasm of colon: Secondary | ICD-10-CM

## 2021-11-10 DIAGNOSIS — I10 Essential (primary) hypertension: Secondary | ICD-10-CM | POA: Diagnosis not present

## 2021-11-10 DIAGNOSIS — E041 Nontoxic single thyroid nodule: Secondary | ICD-10-CM

## 2021-11-10 MED ORDER — AMLODIPINE BESYLATE 10 MG PO TABS: 10.0000 mg | ORAL_TABLET | Freq: Every day | ORAL | 3 refills | Status: DC

## 2021-11-10 NOTE — Patient Instructions (Signed)
It was great seeing you today!  Im glad to see you are doing well!   For your blood pressure we increased your amlodipine from 5 mg to 10 mg a day  Today you received the fecal occult blood test to check for blood in the stool. We will call you if anything is abnormal  You received your flu shot  We scheduled your thyroid ultrasound to monitor your thyroid nodule (appointment time on the back of paperwork)  If you need any further support in quitting smoking please call us at the clinic and we can refer you to our pharmacy team.  Visit Reminders: - Stop by the pharmacy to pick up your prescriptions  - Continue to work on your healthy eating habits and incorporating exercise into your daily life.   Feel free to call with any questions or concerns at any time, at [phone].   Take care,  Dr. [name] [facility]

## 2021-11-10 NOTE — Progress Notes (Signed)
    SUBJECTIVE:   CHIEF COMPLAINT / HPI:   Patient presents for check up  Has a thyroid nodule that is being monitored and is due for her annual ultrasound. Denies any symptoms.   Tobacco use: 10 cigarettes a day does not want assistance with quitting   BP elevated at 149/84 but feels its her hip pain contributing   Obesity- has been losing weight intentionally  Aerobics classes 8 times a week and walking dog  [place] apple cider vinegar once a day  Intermittent fasting. Drinking more water, cutting down carbs and sguar. Eating protein   Patient requests labs to check her liver function. Was told by a different doctor that she is taking a medication that could affect her liver. Given lab closed advised her to schedule a lab visit  PERTINENT  PMH / PSH: Reviewed   OBJECTIVE:   BP (!) 149/84   Pulse 73   Ht '5\' 5"'$  (1.651 m)   Wt 153 lb 6.4 oz (69.6 kg)   SpO2 99%   BMI 25.53 kg/m    Physical exam General: well appearing, NAD HEENT: nodular thyroid palpated  Cardiovascular: RRR, no murmurs Lungs: CTAB. Normal WOB Abdomen: soft, non-distended, non-tender Skin: warm, dry. No edema  ASSESSMENT/PLAN:   No problem-specific Assessment & Plan notes found for this encounter.   Thyroid nodule Ultrasound 1 year ago with a large nodule measuring 1.7 cm that was recommended to follow-up on annually for 5 years.  Placed order for ultrasound  HTN BP 149/84. Currently on Amlodipine 5 mg and Lisinopril '20mg'$ .  Will increase Amlodipine to 10 mg daily  Health Maintenance Flu vaccine administered FOBT for colon cancer screening  Alexis Cole, Alexis Cole

## 2021-11-14 ENCOUNTER — Ambulatory Visit
Admission: RE | Admit: 2021-11-14 | Discharge: 2021-11-14 | Disposition: A | Discharge status: Home / Self Care | Payer: Medicare Other | Source: Ambulatory Visit | Attending: Family Medicine | Admitting: Family Medicine

## 2021-11-14 DIAGNOSIS — E042 Nontoxic multinodular goiter: Secondary | ICD-10-CM | POA: Diagnosis not present

## 2021-11-14 DIAGNOSIS — Z1211 Encounter for screening for malignant neoplasm of colon: Secondary | ICD-10-CM | POA: Diagnosis not present

## 2021-11-14 DIAGNOSIS — E041 Nontoxic single thyroid nodule: Secondary | ICD-10-CM

## 2021-11-15 LAB — FECAL OCCULT BLOOD, IMMUNOCHEMICAL: Fecal Occult Bld: NEGATIVE

## 2021-11-18 ENCOUNTER — Encounter: Payer: Self-pay | Admitting: Family Medicine

## 2021-11-18 DIAGNOSIS — H353211 Exudative age-related macular degeneration, right eye, with active choroidal neovascularization: Secondary | ICD-10-CM | POA: Diagnosis not present

## 2021-11-18 DIAGNOSIS — H353221 Exudative age-related macular degeneration, left eye, with active choroidal neovascularization: Secondary | ICD-10-CM | POA: Diagnosis not present

## 2021-12-06 ENCOUNTER — Other Ambulatory Visit: Payer: Medicare Other

## 2021-12-06 ENCOUNTER — Ambulatory Visit
Admission: RE | Admit: 2021-12-06 | Discharge: 2021-12-06 | Disposition: A | Discharge status: Home / Self Care | Payer: Medicare Other | Source: Ambulatory Visit | Attending: Family Medicine | Admitting: Family Medicine

## 2021-12-06 DIAGNOSIS — Z9189 Other specified personal risk factors, not elsewhere classified: Secondary | ICD-10-CM

## 2021-12-06 DIAGNOSIS — Z780 Asymptomatic menopausal state: Secondary | ICD-10-CM | POA: Diagnosis not present

## 2021-12-16 DIAGNOSIS — H353231 Exudative age-related macular degeneration, bilateral, with active choroidal neovascularization: Secondary | ICD-10-CM | POA: Diagnosis not present

## 2021-12-22 ENCOUNTER — Telehealth: Payer: Self-pay

## 2021-12-22 NOTE — Patient Outreach (Signed)
  Care Coordination   12/22/2021 Name: Alexis Cole MRN: [id number] DOB: 1945/03/30   Care Coordination Outreach Attempts:  An unsuccessful telephone outreach was attempted today to offer the patient information about available care coordination services as a benefit of their health plan.   Follow Up Plan:  Additional outreach attempts will be made to offer the patient care coordination information and services.   Encounter Outcome:  No Answer  Care Coordination Interventions Activated:  No   Care Coordination Interventions:  No, not indicated    Johnney Killian, RN, BSN, CCM Care Management Coordinator [facility] Phone: [phone]: [phone]

## 2021-12-30 DIAGNOSIS — M47816 Spondylosis without myelopathy or radiculopathy, lumbar region: Secondary | ICD-10-CM | POA: Diagnosis not present

## 2022-01-22 IMAGING — CR DG CHEST 2V
2 series · 2 of 2 positions shown · non-contrast
Comparison: May 06, 2018

CLINICAL DATA: Preoperative evaluation.

EXAM:
CHEST - 2 VIEW

[w chest pa]
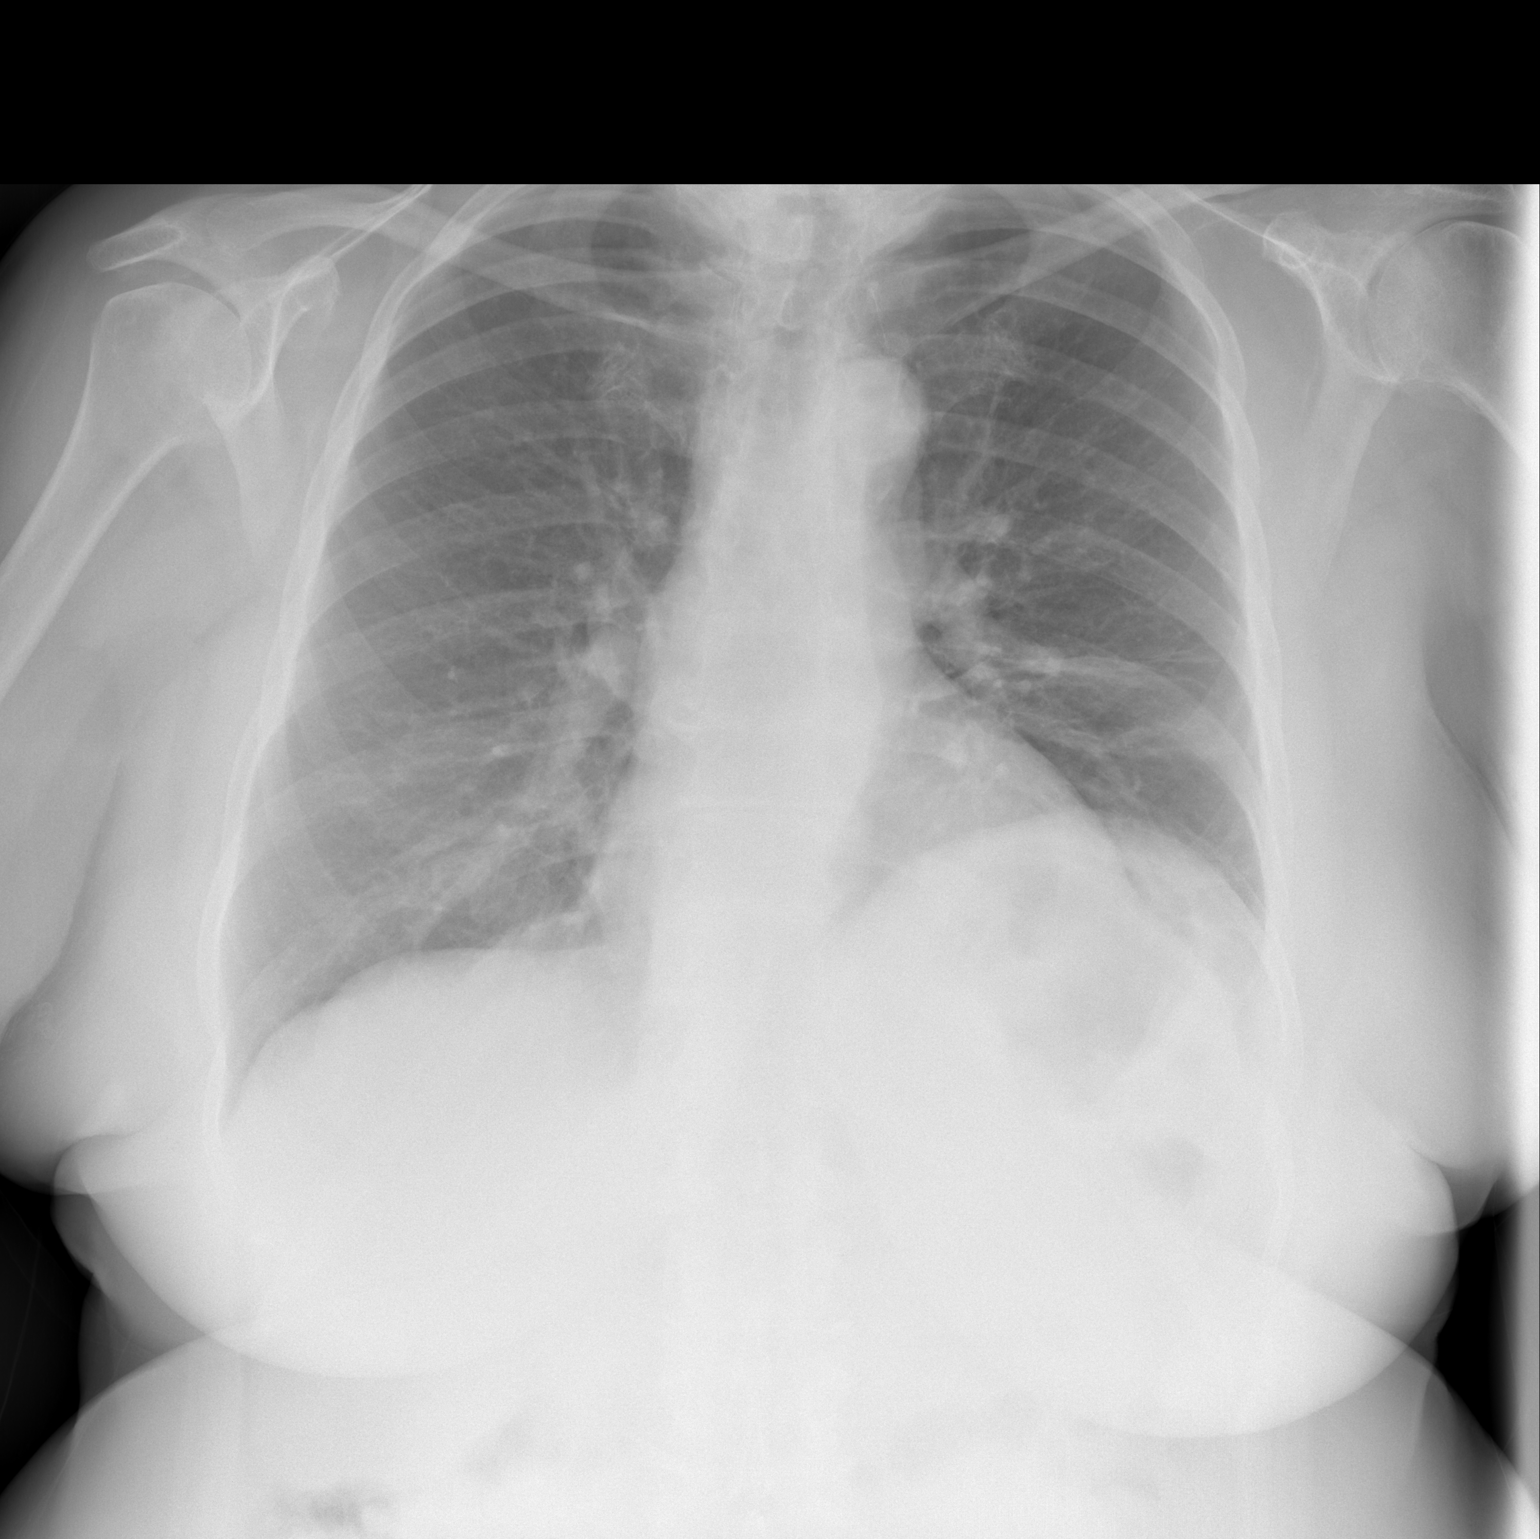

[w chest lat]
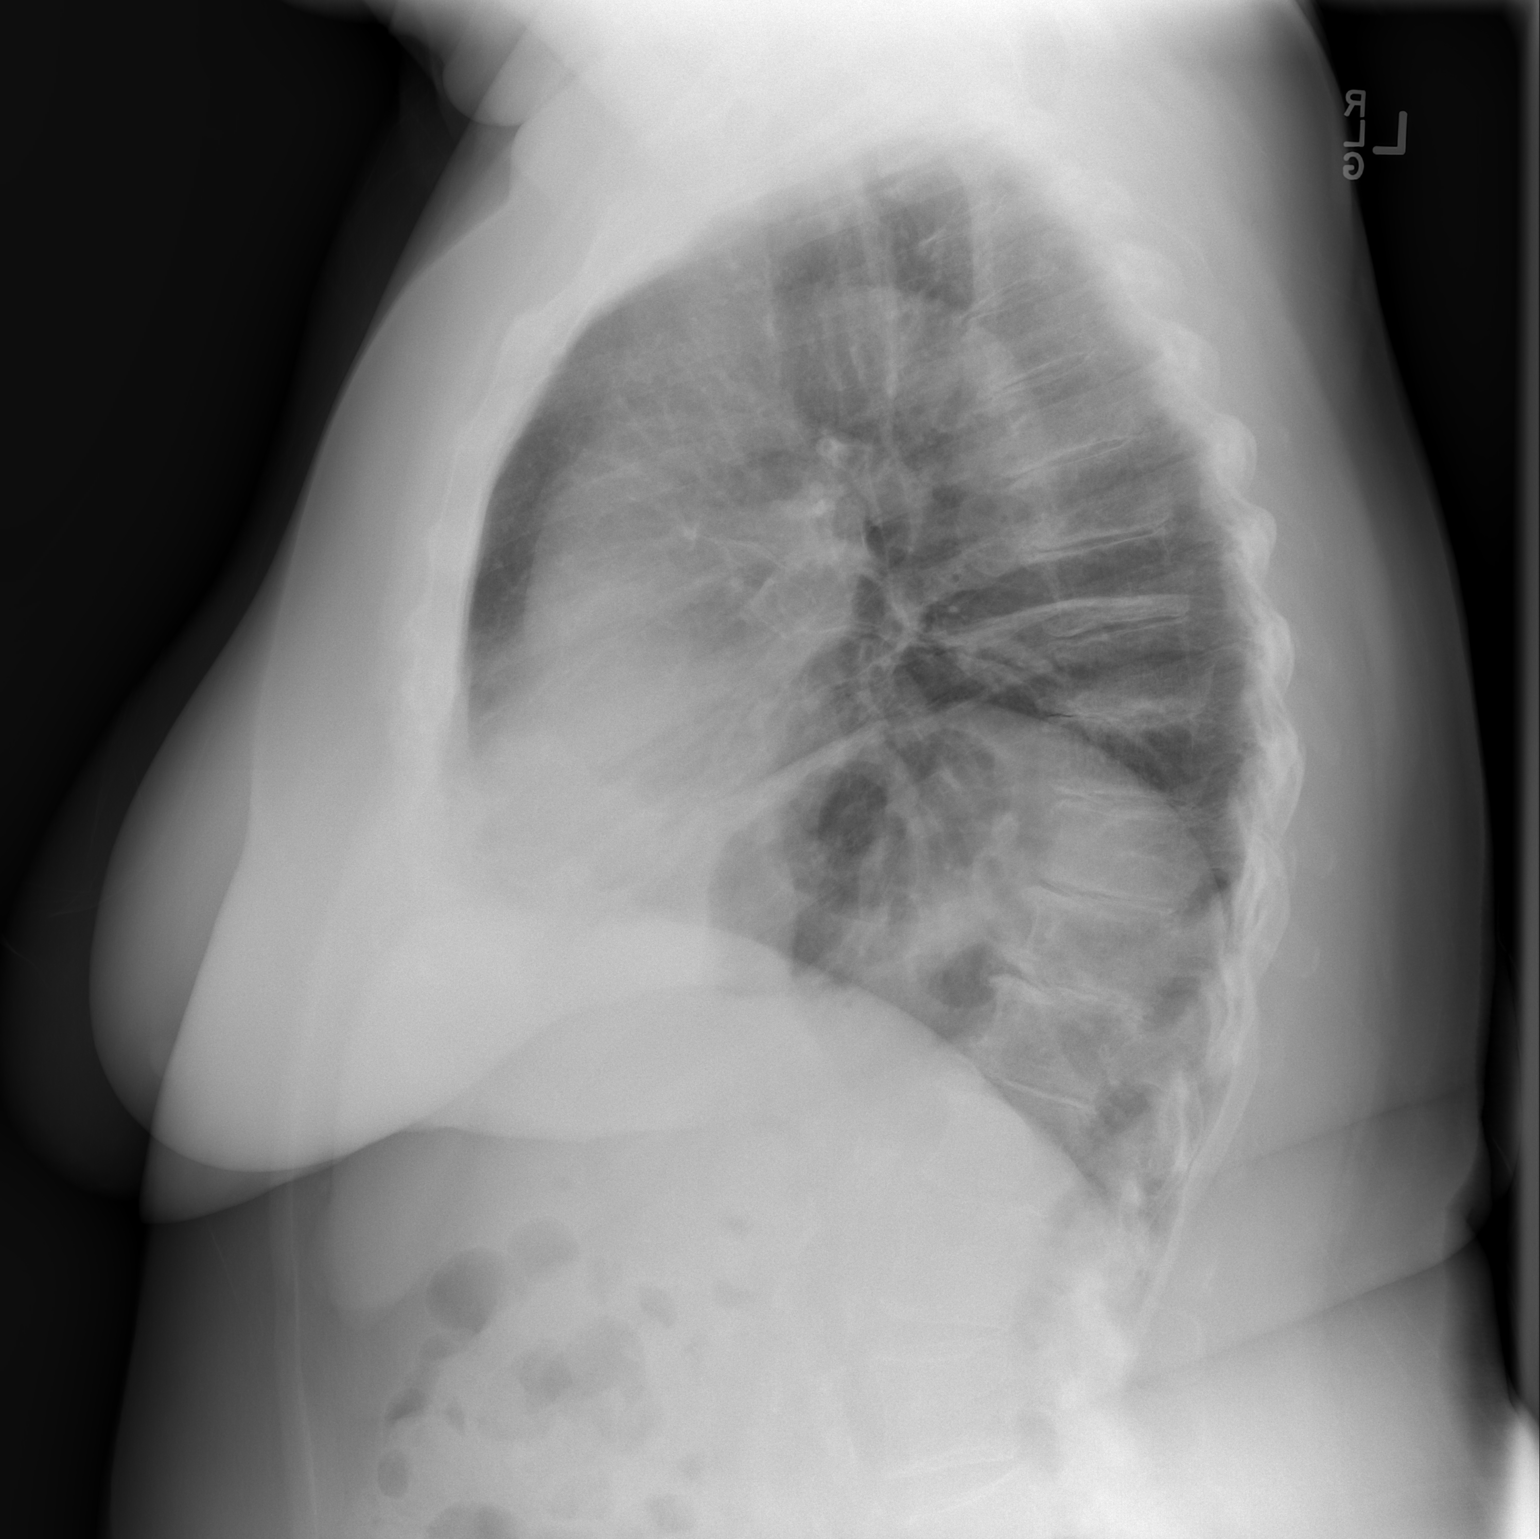

[2 of 2 positions shown; findings below may reference images not displayed]

FINDINGS: Mild atelectasis is seen within the left lung base. There is no
evidence of a pleural effusion or pneumothorax. Mild, stable
elevation of the left hemidiaphragm is seen. The heart size and
mediastinal contours are within normal limits. The visualized
skeletal structures are unremarkable.
IMPRESSION: Mild left basilar atelectasis.

## 2022-01-27 DIAGNOSIS — H353231 Exudative age-related macular degeneration, bilateral, with active choroidal neovascularization: Secondary | ICD-10-CM | POA: Diagnosis not present

## 2022-01-31 DIAGNOSIS — M47816 Spondylosis without myelopathy or radiculopathy, lumbar region: Secondary | ICD-10-CM | POA: Diagnosis not present

## 2022-03-21 DIAGNOSIS — M47816 Spondylosis without myelopathy or radiculopathy, lumbar region: Secondary | ICD-10-CM | POA: Diagnosis not present

## 2022-03-27 ENCOUNTER — Other Ambulatory Visit: Payer: Self-pay | Admitting: *Deleted

## 2022-03-27 DIAGNOSIS — F341 Dysthymic disorder: Secondary | ICD-10-CM

## 2022-03-28 MED ORDER — BUSPIRONE HCL 10 MG PO TABS: 10.0000 mg | ORAL_TABLET | Freq: Two times a day (BID) | ORAL | 3 refills | Status: DC

## 2022-03-29 DIAGNOSIS — H353231 Exudative age-related macular degeneration, bilateral, with active choroidal neovascularization: Secondary | ICD-10-CM | POA: Diagnosis not present

## 2022-04-03 ENCOUNTER — Other Ambulatory Visit: Payer: Self-pay | Admitting: Family Medicine

## 2022-04-03 DIAGNOSIS — G4700 Insomnia, unspecified: Secondary | ICD-10-CM

## 2022-04-10 NOTE — Telephone Encounter (Signed)
Open in error

## 2022-05-12 ENCOUNTER — Other Ambulatory Visit: Payer: Self-pay

## 2022-05-12 MED ORDER — METRONIDAZOLE 1 % EX GEL: Freq: Every day | CUTANEOUS | 0 refills | Status: DC

## 2022-05-15 ENCOUNTER — Other Ambulatory Visit: Payer: Self-pay | Admitting: Family Medicine

## 2022-05-15 DIAGNOSIS — I10 Essential (primary) hypertension: Secondary | ICD-10-CM

## 2022-05-18 IMAGING — US US THYROID
1 series · 12 of 25 positions shown · non-contrast
Comparison: Chest CT from 09/22/2019

CLINICAL DATA: Incidental on CT. 74-year-old female with history of
right-sided thyroid nodule incidentally noted on lung cancer
screening chest CT.

EXAM:
THYROID ULTRASOUND
TECHNIQUE: Ultrasound examination of the thyroid gland and adjacent soft
tissues was performed.

[Series 1: us thyroid · 0.09mm/px · 12 of 85 slices shown]
[im 4/85]
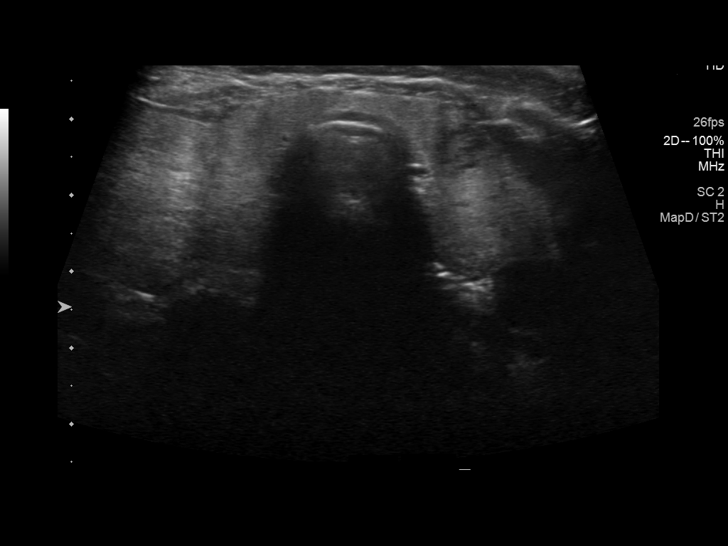
[im 11/85]
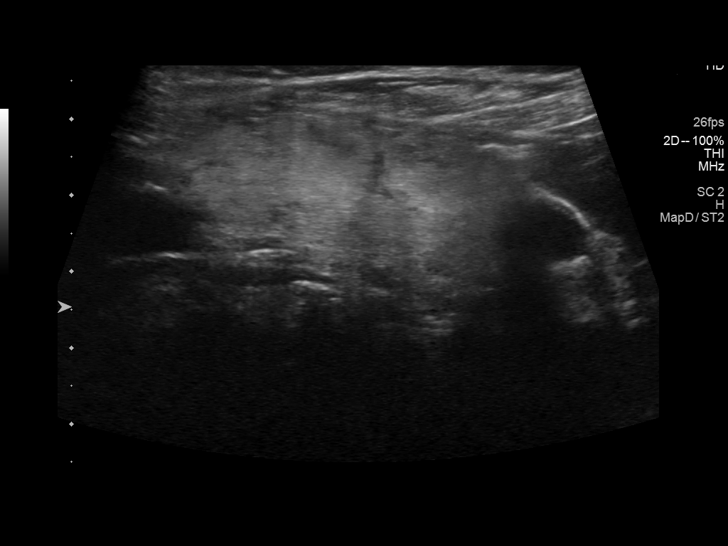
[im 18/85]
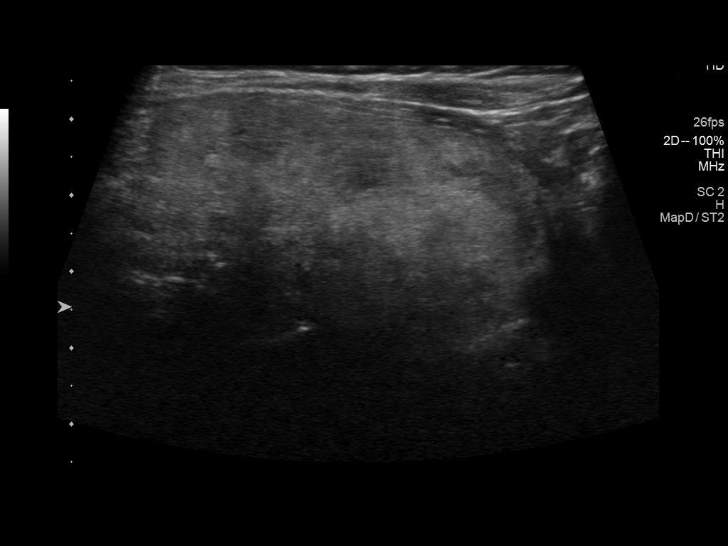
[im 25/85]
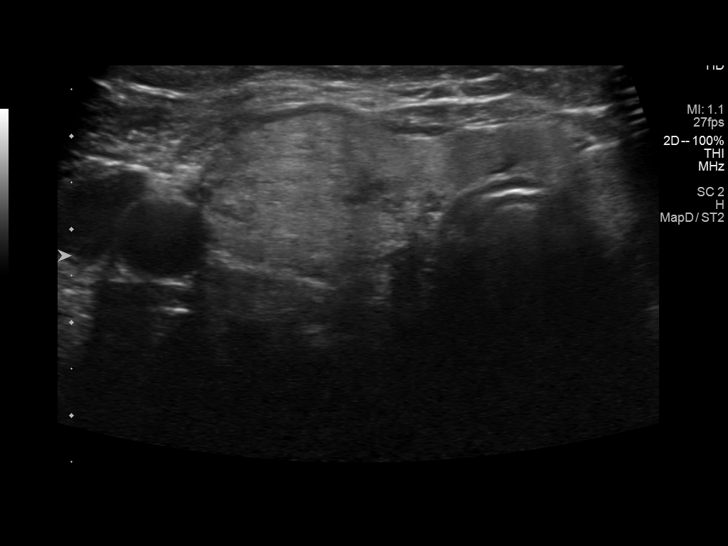
[im 32/85]
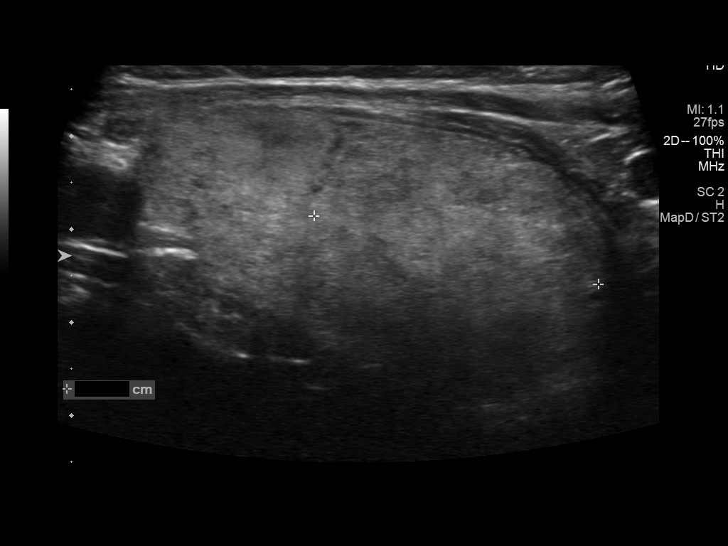
[im 39/85]
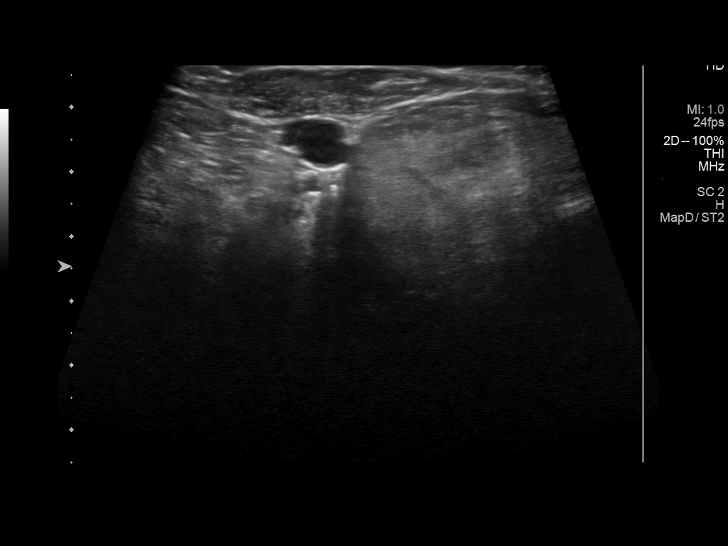
[im 46/85]
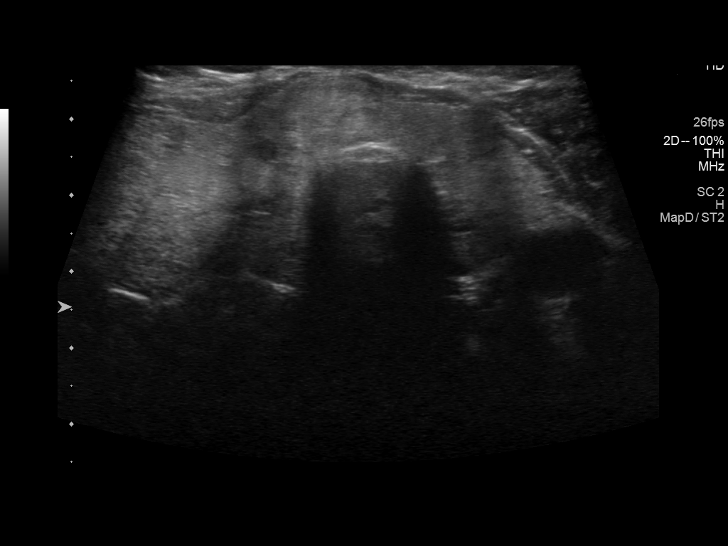
[im 53/85]
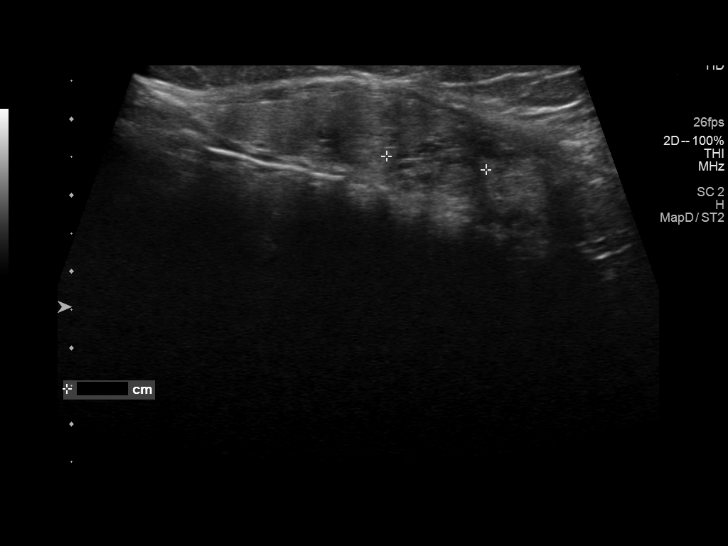
[im 60/85]
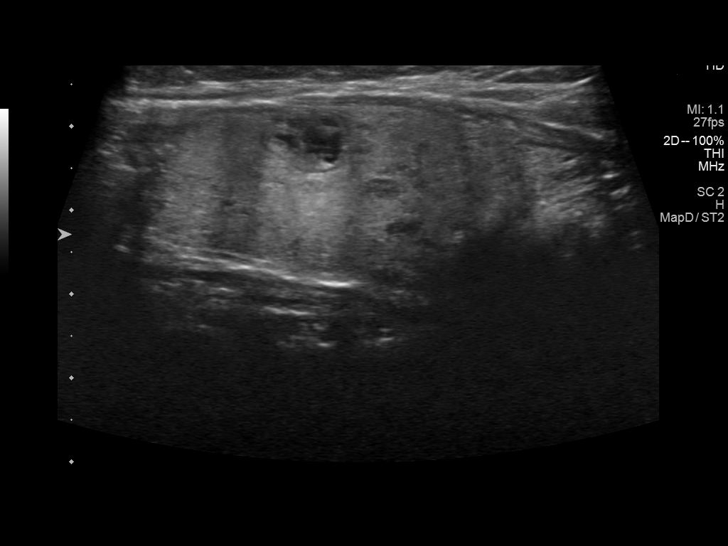
[im 67/85]
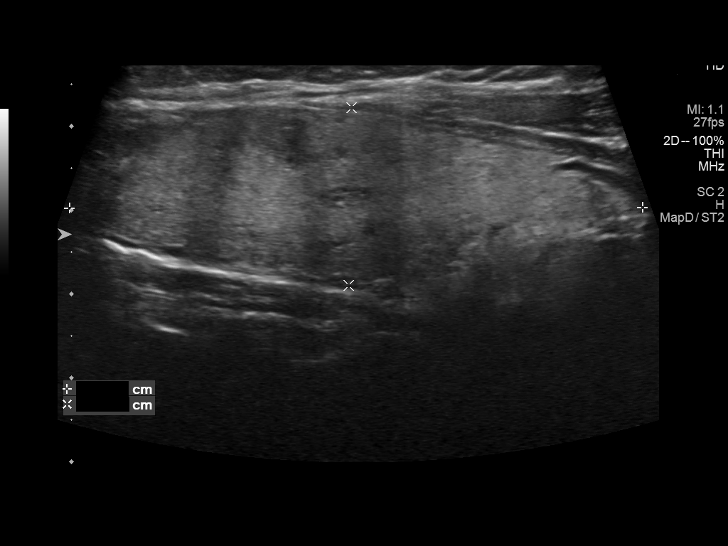
[im 74/85]
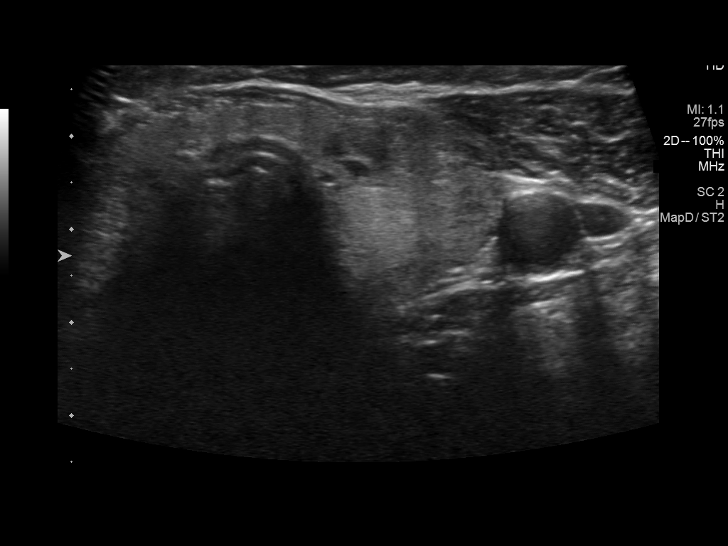
[im 81/85]
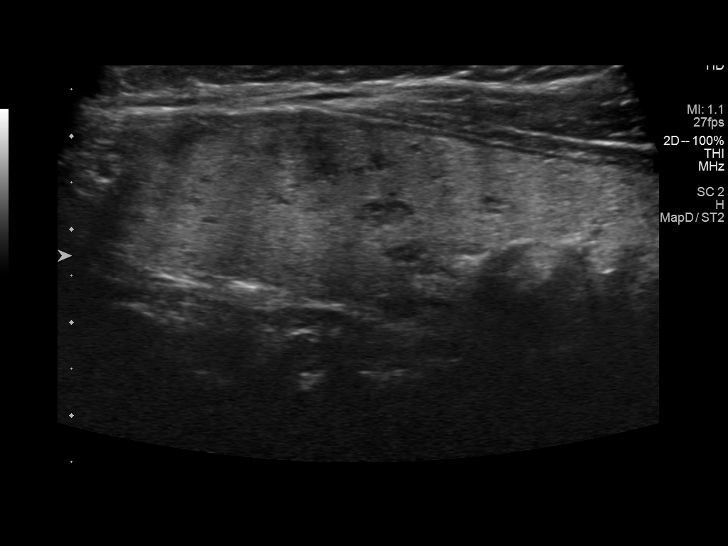

[12 of 25 positions shown; findings below may reference images not displayed]

FINDINGS: Parenchymal Echotexture: Moderately heterogenous

Isthmus: 1.0 cm

Right lobe: 7.1 x 2.1 x 3.4 cm

Left lobe: 6.8 x 2.1 x 2.0 cm

_________________________________________________________

Estimated total number of nodules >/= 1 cm: 4

Number of spongiform nodules >/=  2 cm not described below (TR1): 0

Number of mixed cystic and solid nodules >/= 1.5 cm not described
below (TR2): 0

_________________________________________________________

Nodule # 1:

Location: Right; Superior

Maximum size: 0.8 cm; Other 2 dimensions: 0.8 x 0.7 cm

Composition: mixed cystic and solid (1)

Echogenicity: hypoechoic (2)

Shape: not taller-than-wide (0)

Margins: ill-defined (0)

Echogenic foci: none (0)

ACR TI-RADS total points: 3.

ACR TI-RADS risk category: TR3 (3 points).

ACR TI-RADS recommendations:

Given size (<1.4 cm) and appearance, this nodule does NOT meet
TI-RADS criteria for biopsy or dedicated follow-up.

_________________________________________________________

Nodule # 2:

Location: Right; Inferior

Maximum size: 2.7 cm; Other 2 dimensions: 2.3 x 1.7 cm

Composition: solid/almost completely solid (2)

Echogenicity: hyperechoic (1)

Shape: not taller-than-wide (0)

Margins: ill-defined (0)

Echogenic foci: none (0)

ACR TI-RADS total points: 3.

ACR TI-RADS risk category: TR3 (3 points).

ACR TI-RADS recommendations:

**Given size (>/= 2.5 cm) and appearance, fine needle aspiration of
this mildly suspicious nodule should be considered based on TI-RADS
criteria.

_________________________________________________________

Nodule # 3:

Location: Isthmus; Superior

Maximum size: 1.6 cm; Other 2 dimensions: 1.4 x 0.8 cm

Composition: solid/almost completely solid (2)

Echogenicity: hyperechoic (1)

Shape: not taller-than-wide (0)

Margins: ill-defined (0)

Echogenic foci: none (0)

ACR TI-RADS total points: 3.

ACR TI-RADS risk category: TR3 (3 points).

ACR TI-RADS recommendations:

*Given size (>/= 1.5 - 2.4 cm) and appearance, a follow-up
ultrasound in 1 year should be considered based on TI-RADS criteria.

_________________________________________________________

Nodule # 4:

Location: Isthmus; Inferior

Maximum size: 1.2 cm; Other 2 dimensions: 0.9 x 0.5 cm

Composition: mixed cystic and solid (1)

Echogenicity: isoechoic (1)

Shape: not taller-than-wide (0)

Margins: ill-defined (0)

Echogenic foci: none (0)

ACR TI-RADS total points: 2.

ACR TI-RADS risk category: TR2 (2 points).

ACR TI-RADS recommendations:

This nodule does NOT meet TI-RADS criteria for biopsy or dedicated
follow-up.

_________________________________________________________

Nodule # 5:

Location: Left; Mid

Maximum size: 1.2 cm; Other 2 dimensions: 1.1 x 0.9 cm

Composition: mixed cystic and solid (1)

Echogenicity: isoechoic (1)

Shape: not taller-than-wide (0)

Margins: ill-defined (0)

Echogenic foci: none (0)

ACR TI-RADS total points: 2.

ACR TI-RADS risk category: TR2 (2 points).

ACR TI-RADS recommendations:

This nodule does NOT meet TI-RADS criteria for biopsy or dedicated
follow-up.

_________________________________________________________
IMPRESSION: 1. Multinodular goiter.
2. Dominant nodule in the right inferior lobe (#2) measuring up to
2.7 cm is mildly suspicious for malignancy (TIRADS 3). Fine-needle
aspiration should be considered.
3. Additional superior isthmus nodule (#3) measuring up to 1.6 cm is
mildly suspicious for malignancy (TIRADS 3). Consider annual
ultrasound follow-up until 5 years of stability is established.
4. Additional scattered nodules (#1, #4, and #5) are
benign-appearing and do not warrant dedicated follow-up.

The above is in keeping with the ACR TI-RADS recommendations - [HOSPITAL] 7271;[DATE].

## 2022-05-24 DIAGNOSIS — H353231 Exudative age-related macular degeneration, bilateral, with active choroidal neovascularization: Secondary | ICD-10-CM | POA: Diagnosis not present

## 2022-07-21 DIAGNOSIS — H353231 Exudative age-related macular degeneration, bilateral, with active choroidal neovascularization: Secondary | ICD-10-CM | POA: Diagnosis not present

## 2022-07-24 ENCOUNTER — Other Ambulatory Visit: Payer: Self-pay | Admitting: Family Medicine

## 2022-07-24 DIAGNOSIS — G4700 Insomnia, unspecified: Secondary | ICD-10-CM

## 2022-07-24 DIAGNOSIS — I10 Essential (primary) hypertension: Secondary | ICD-10-CM

## 2022-07-25 ENCOUNTER — Other Ambulatory Visit: Payer: Self-pay | Admitting: Family Medicine

## 2022-07-31 ENCOUNTER — Ambulatory Visit (INDEPENDENT_AMBULATORY_CARE_PROVIDER_SITE_OTHER): Payer: 59

## 2022-07-31 VITALS — Ht 65.0 in | Wt 153.0 lb

## 2022-07-31 DIAGNOSIS — Z0000 Encounter for general adult medical examination without abnormal findings: Secondary | ICD-10-CM

## 2022-07-31 NOTE — Progress Notes (Signed)
Subjective:   Alexis Cole is a 77 y.o. female who presents for Medicare Annual (Subsequent) preventive examination.  I connected with  Alexis Cole on 07/31/22 by a audio enabled telemedicine application and verified that I am speaking with the correct person using two identifiers.  Patient Location: Home  Provider Location: Home Office  I discussed the limitations of evaluation and management by telemedicine. The patient expressed understanding and agreed to proceed.  Review of Systems     Cardiac Risk Factors include: advanced age (>64men, >5 women);hypertension;dyslipidemia;smoking/ tobacco exposure     Objective:    Today's Vitals   07/31/22 1454  Weight: 153 lb (69.4 kg)  Height: 5\' 5"  (1.651 m)   Body mass index is 25.46 kg/m.     07/31/2022    4:15 PM 11/10/2021    4:23 PM 05/25/2021    2:33 PM 04/19/2021    9:21 AM 09/30/2020    1:35 PM 07/26/2020    8:33 AM 05/24/2020    8:51 AM  Advanced Directives  Does Patient Have a Medical Advance Directive? No No No No No No No  Would patient like information on creating a medical advance directive? Yes (MAU/Ambulatory/Procedural Areas - Information given)  No - Patient declined Yes (MAU/Ambulatory/Procedural Areas - Information given) No - Patient declined No - Patient declined No - Patient declined    Current Medications (verified) Outpatient Encounter Medications as of 07/31/2022  Medication Sig   amLODipine (NORVASC) 10 MG tablet Take 1 tablet (10 mg total) by mouth at bedtime.   atorvastatin (LIPITOR) 40 MG tablet TAKE 1 TABLET BY MOUTH EVERY DAY   BIOTIN PO Take 500 mcg by mouth at bedtime.    busPIRone (BUSPAR) 10 MG tablet Take 1 tablet (10 mg total) by mouth 2 (two) times daily.   Cholecalciferol (VITAMIN D3) 50 MCG (2000 UT) capsule Take 2,000 Units by mouth at bedtime.    fluticasone (FLONASE) 50 MCG/ACT nasal spray PLACE 1 SPRAY INTO BOTH NOSTRILS DAILY AS NEEDED FOR ALLERGIES OR RHINITIS.   folic acid  (FOLVITE) 400 MCG tablet Take 400 mcg by mouth at bedtime.    gabapentin (NEURONTIN) 300 MG capsule TAKE 1 CAPSULE 3 (THREE) TIMES DAILY. TAKE 1 CAPSULE WITH BREAKFAST, LUNCH, AND AT BEDTIME   Krill Oil 350 MG CAPS Take 350 mg by mouth at bedtime.    lisinopril (ZESTRIL) 20 MG tablet TAKE 1 TABLET BY MOUTH EVERY DAY   metroNIDAZOLE (METROGEL) 1 % gel APPLY TO AFFECTED AREA TOPICALLY EVERY DAY   Multiple Vitamins-Minerals (HEALTHY EYES PO) Take 1 tablet by mouth at bedtime.   albuterol (VENTOLIN HFA) 108 (90 Base) MCG/ACT inhaler INHALE 2-4 PUFFS INTO THE LUNGS EVERY 4 HOURS AS NEEDED FOR WHEEZING (OR COUGH). (Patient not taking: Reported on 04/19/2021)   aspirin EC 325 MG tablet Take 1 tablet (325 mg total) by mouth 2 (two) times daily after a meal. Take x 1 month post op to decrease risk of blood clots. (Patient not taking: Reported on 09/30/2020)   Coenzyme Q10 (CO Q-10) 300 MG CAPS Take 300 mg by mouth at bedtime.  (Patient not taking: Reported on 11/10/2021)   docusate sodium (COLACE) 100 MG capsule Take 1 capsule (100 mg total) by mouth 2 (two) times daily. (Patient not taking: Reported on 12/28/2020)   escitalopram (LEXAPRO) 5 MG tablet TAKE 1 TABLET BY MOUTH EVERY DAY (Patient not taking: Reported on 11/10/2021)   tiZANidine (ZANAFLEX) 2 MG tablet Take 1 tablet (2 mg total)  by mouth every 8 (eight) hours as needed for muscle spasms. (Patient not taking: Reported on 11/10/2021)   [DISCONTINUED] amLODipine (NORVASC) 5 MG tablet TAKE 1 TABLET (5 MG TOTAL) BY MOUTH DAILY.   No facility-administered encounter medications on file as of 07/31/2022.    Allergies (verified) Tape   History: Past Medical History:  Diagnosis Date   Acne    but not being treated for   Acute recurrent sinusitis 08/13/2014   Anemia    history of--many yrs ago   Anxiety    but doesn't take any meds for this   Aortic atherosclerosis (HCC) 06/08/2016   noted on CT abd/pelvis   Arthritis    Knees, hips back, shoulder    Arthropathy, lower leg 02/10/2009   For eye care pt is going to Dr. Dione Booze, and her Ortho surgeon is Dr. Jodi Geralds and [facility].     Back pain 11/17/2015   Breast mass in female 08/20/2014   IMPRESSION: Further evaluation is suggested for possible mass in the left breast.The patient will be contacted regarding the findings, and additional imaging will be scheduled.  BI-RADS CATEGORY  0: Incomplete. Need additional imaging evaluation and/or prior mammograms for comparison.    Chronic back pain    Complication of anesthesia    States she woke up just after parathyroid surgery while still in OR and attempted to get off the bed, remebers seeing staffs back towards her   Depression    Diarrhea    Diverticulitis 06/08/2016   noted on CT abd/pelvis   Diverticulosis 06/08/2016   noted on CT abd/pelvis   Dyslipidemia    Dyspnea 10/26/2017   Estrogen deficiency 08/13/2014   Fatigue 02/24/2016   Fatty liver 06/08/2016   severe noted on CT abd/pelvis    Gastric ulcer    history of--many yrs ago   General weakness 06/08/2016   Headache(784.0)    History of migraine   Heart murmur    History of bronchitis    last time 6months   History of syncope    Hx of migraines    last one a month ago   Hypercalcemia 12/12/2016   Hyperparathyroidism (HCC) 12/12/2016   Hypertension     Dr     Annia Friendly   Increased endometrial stripe thickness 06/13/2016   Insomnia    Nyquil nightly   Joint pain    Joint swelling    Lactose intolerance    Nausea and vomiting    Obesity    Pneumonia 03/15/2016   left lower lobe, left lower lobe atelectasis, elevated left hemidiaphragm noted again, small left pleural effusion   Pre-syncope 04/12/2016   PTSD (post-traumatic stress disorder)    Saddle anesthesia 06/28/2018   Sinusitis    Urge incontinence of urine    Past Surgical History:  Procedure Laterality Date   CATARACT EXTRACTION, BILATERAL     CERVICAL CONE BIOPSY     DILATION AND CURETTAGE OF UTERUS  30+yrs  ago   MOUTH SURGERY     NASAL SEPTUM SURGERY  40+yrs ago   PARATHYROIDECTOMY Left 01/04/2017   Procedure: LEFT SUPERIOR PARATHYROIDECTOMY;  Surgeon: Darnell Level, MD;  Location: [facility];  Service: General;  Laterality: Left;   TOTAL HIP ARTHROPLASTY  09/01/2011   Procedure: TOTAL HIP ARTHROPLASTY;  Surgeon: Harvie Junior, MD;  Location: [facility];  Service: Orthopedics;  Laterality: Right;   TOTAL HIP ARTHROPLASTY Left 07/26/2018   Procedure: TOTAL HIP ARTHROPLASTY ANTERIOR APPROACH;  Surgeon: Jodi Geralds, MD;  Location: [facility];  Service: Orthopedics;  Laterality: Left;   TOTAL KNEE ARTHROPLASTY Left 03/09/2017   Procedure: LEFT TOTAL KNEE ARTHROPLASTY;  Surgeon: Jodi Geralds, MD;  Location: [facility];  Service: Orthopedics;  Laterality: Left;   TOTAL KNEE ARTHROPLASTY Right 07/18/2019   Procedure: TOTAL KNEE ARTHROPLASTY;  Surgeon: Jodi Geralds, MD;  Location: [facility];  Service: Orthopedics;  Laterality: Right;   TUBAL LIGATION  30+yrs ago   Family History  Problem Relation Age of Onset   Hypertension Mother    Arthritis-Osteo Mother    Irritable bowel syndrome Mother    Cancer - Prostate Father    High blood pressure Brother    High blood pressure Son    Diabetes Mellitus II Maternal Grandmother    Heart attack Maternal Grandfather    Hyperlipidemia Neg Hx    Diabetes Neg Hx    Sudden death Neg Hx    Breast cancer Neg Hx    Social History   Socioeconomic History   Marital status: Divorced    Spouse name: Not on file   Number of children: 1   Years of education: 12   Highest education level: High school graduate  Occupational History   Occupation: Producer, television/film/video     Comment: retired    Occupation: Paediatric nurse     Comment: retired   Tobacco Use   Smoking status: Every Day    Packs/day: 0.50    Years: 15.00    Additional pack years: 0.00    Total pack years: 7.50    Types: Cigarettes    Start date: 02/21/1968    Passive exposure: Current   Smokeless tobacco: Never   Tobacco  comments:    quit in the past for 9 years.  Previous 1ppd  Vaping Use   Vaping Use: Never used  Substance and Sexual Activity   Alcohol use: No    Alcohol/week: 0.0 standard drinks of alcohol   Drug use: No   Sexual activity: Not Currently    Birth control/protection: Post-menopausal  Other Topics Concern   Not on file  Social History Narrative   Patient lives alone in [place].    The patient does have a dog named Cardio who brings her great joy.   Patients only child, a son, lives out in [place]. They talk on the phone several times a week.    The patient is Buddhist and practices daily meditation.    The patient enjoys walking her dog, zumba classes and swimming.        Social Determinants of Health   Financial Resource Strain: Low Risk  (07/31/2022)   Overall Financial Resource Strain (CARDIA)    Difficulty of Paying Living Expenses: Not hard at all  Food Insecurity: No Food Insecurity (07/31/2022)   Hunger Vital Sign    Worried About Running Out of Food in the Last Year: Never true    Ran Out of Food in the Last Year: Never true  Transportation Needs: No Transportation Needs (07/31/2022)   PRAPARE - Administrator, Civil Service (Medical): No    Lack of Transportation (Non-Medical): No  Physical Activity: Sufficiently Active (07/31/2022)   Exercise Vital Sign    Days of Exercise per Week: 6 days    Minutes of Exercise per Session: 40 min  Stress: No Stress Concern Present (07/31/2022)   [employer] of Occupational Health - Occupational Stress Questionnaire    Feeling of Stress : Not at all  Social Connections: Socially Isolated (07/31/2022)  Social Connection and Isolation Panel [NHANES]    Frequency of Communication with Friends and Family: More than three times a week    Frequency of Social Gatherings with Friends and Family: Three times a week    Attends Religious Services: Never    Active Member of Clubs or Organizations: No    Attends Tax inspector Meetings: Never    Marital Status: Divorced    Tobacco Counseling Ready to quit: Not Answered Counseling given: Not Answered Tobacco comments: quit in the past for 9 years.  Previous 1ppd   Clinical Intake:  Pre-visit preparation completed: Yes  Pain : No/denies pain  Diabetes: No  How often do you need to have someone help you when you read instructions, pamphlets, or other written materials from your doctor or pharmacy?: 1 - Never  Diabetic?No   Interpreter Needed?: No  Information entered by :: Kandis Fantasia LPN   Activities of Daily Living    07/31/2022    4:14 PM  In your present state of health, do you have any difficulty performing the following activities:  Hearing? 0  Vision? 0  Difficulty concentrating or making decisions? 0  Walking or climbing stairs? 0  Dressing or bathing? 0  Doing errands, shopping? 0  Preparing Food and eating ? N  Using the Toilet? N  In the past six months, have you accidently leaked urine? N  Do you have problems with loss of bowel control? N  Managing your Medications? N  Managing your Finances? N  Housekeeping or managing your Housekeeping? N    Patient Care Team: Cora Collum, DO as PCP - General (Family Medicine) Jodi Geralds, MD as Consulting Physician (Orthopedic Surgery)  Indicate any recent Medical Services you may have received from other than [facility] providers in the past year (date may be approximate).     Assessment:   This is a routine wellness examination for Alexis Cole.  Hearing/Vision screen Hearing Screening - Comments:: Denies hearing difficulties   Vision Screening - Comments:: up to date with routine eye exams with Dr. Jenene Slicker   Dietary issues and exercise activities discussed: Current Exercise Habits: Home exercise routine, Type of exercise: walking;yoga;stretching, Time (Minutes): 40, Frequency (Times/Week): 6, Weekly Exercise (Minutes/Week): 240, Intensity: Moderate   Goals Addressed              This Visit's Progress    Remain active and independent         Depression Screen    07/31/2022    4:13 PM 11/10/2021    4:23 PM 04/19/2021    9:18 AM 09/30/2020    1:34 PM 07/26/2020    8:33 AM 05/24/2020    8:56 AM 09/22/2019    2:56 PM  PHQ 2/9 Scores  PHQ - 2 Score 0 0 2 2 0 2 2  PHQ- 9 Score  6 8 8 7 12 12     Fall Risk    07/31/2022    4:14 PM 05/25/2021    2:32 PM 04/19/2021    9:21 AM 09/30/2020    1:34 PM 07/26/2020    8:33 AM  Fall Risk   Falls in the past year? 0 0 0 0 0  Number falls in past yr: 0 0 0 0 0  Injury with Fall? 0 0 0 0 0  Risk for fall due to : No Fall Risks  No Fall Risks    Follow up Falls prevention discussed;Education provided;Falls evaluation completed  Falls prevention discussed  FALL RISK PREVENTION PERTAINING TO THE HOME:  Any stairs in or around the home? No  If so, are there any without handrails? No  Home free of loose throw rugs in walkways, pet beds, electrical cords, etc? Yes  Adequate lighting in your home to reduce risk of falls? Yes   ASSISTIVE DEVICES UTILIZED TO PREVENT FALLS:  Life alert? No  Use of a cane, walker or w/c? No  Grab bars in the bathroom? Yes  Shower chair or bench in shower? No  Elevated toilet seat or a handicapped toilet? Yes   TIMED UP AND GO:  Was the test performed? No . Telephonic visit   Cognitive Function:        07/31/2022    4:15 PM 04/19/2021    9:23 AM 09/09/2018    9:09 AM  6CIT Screen  What Year? 0 points 0 points 0 points  What month? 0 points 0 points 0 points  What time? 0 points 0 points 0 points  Count back from 20 0 points 0 points 0 points  Months in reverse 0 points 0 points 0 points  Repeat phrase 0 points 0 points 0 points  Total Score 0 points 0 points 0 points    Immunizations Immunization History  Administered Date(s) Administered   Fluad Quad(high Dose 65+) 12/28/2020, 11/10/2021   Influenza,inj,Quad PF,6+ Mos 01/03/2013, 04/17/2014, 11/17/2015,  10/25/2016, 10/25/2017, 11/05/2018, 11/26/2019   PFIZER Comirnaty(Gray Top)Covid-19 Tri-Sucrose Vaccine 05/28/2020   PFIZER(Purple Top)SARS-COV-2 Vaccination 04/15/2019, 05/06/2019, 01/11/2020, 06/07/2020   Pfizer Covid-19 Vaccine Bivalent Booster 63yrs & up 11/12/2020, 09/10/2021   Pneumococcal Conjugate-13 04/17/2014   Pneumococcal Polysaccharide-23 09/04/2011   Tdap 08/13/2014   Zoster Recombinat (Shingrix) 10/07/2019, 11/21/2019    TDAP status: Up to date  Pneumococcal vaccine status: Up to date  Covid-19 vaccine status: Information provided on how to obtain vaccines.   Qualifies for Shingles Vaccine? Yes   Zostavax completed No   Shingrix Completed?: Yes  Screening Tests Health Maintenance  Topic Date Due   Colonoscopy  Never done   COVID-19 Vaccine (8 - 2023-24 season) 11/05/2021   INFLUENZA VACCINE  09/21/2022   COLON CANCER SCREENING ANNUAL FOBT  11/15/2022   Medicare Annual Wellness (AWV)  07/31/2023   DTaP/Tdap/Td (2 - Td or Tdap) 08/12/2024   Pneumonia Vaccine 52+ Years old  Completed   DEXA SCAN  Completed   Hepatitis C Screening  Completed   Zoster Vaccines- Shingrix  Completed   HPV VACCINES  Aged Out    Health Maintenance  Health Maintenance Due  Topic Date Due   Colonoscopy  Never done   COVID-19 Vaccine (8 - 2023-24 season) 11/05/2021    Colorectal cancer screening: Type of screening: FOBT/FIT. Completed 11/14/21. Repeat every 1 years  Mammogram status: Completed 08/13/20. Repeat every year  Bone Density status: Completed 12/06/21. Results reflect: Bone density results: NORMAL. Repeat every 5 years.  Lung Cancer Screening: (Low Dose CT Chest recommended if Age 70-80 years, 30 pack-year currently smoking OR have quit w/in 15years.) does not qualify.   Lung Cancer Screening Referral: n/a  Additional Screening:  Hepatitis C Screening: does qualify; Completed 04/13/16  Vision Screening: Recommended annual ophthalmology exams for early detection of  glaucoma and other disorders of the eye. Is the patient up to date with their annual eye exam?  Yes  Who is the provider or what is the name of the office in which the patient attends annual eye exams? Dr. Jenene Slicker  If pt is not established with a  provider, would they like to be referred to a provider to establish care? No .   Dental Screening: Recommended annual dental exams for proper oral hygiene  Community Resource Referral / Chronic Care Management: CRR required this visit?  No   CCM required this visit?  No      Plan:     I have personally reviewed and noted the following in the patient's chart:   Medical and social history Use of alcohol, tobacco or illicit drugs  Current medications and supplements including opioid prescriptions. Patient is not currently taking opioid prescriptions. Functional ability and status Nutritional status Physical activity Advanced directives List of other physicians Hospitalizations, surgeries, and ER visits in previous 12 months Vitals Screenings to include cognitive, depression, and falls Referrals and appointments  In addition, I have reviewed and discussed with patient certain preventive protocols, quality metrics, and best practice recommendations. A written personalized care plan for preventive services as well as general preventive health recommendations were provided to patient.     Durwin Nora, [place]   [id number]   Due to this being a virtual visit, the after visit summary with patients personalized plan was offered to patient via mail or my-chart.  per request, patient was mailed a copy of AVS.  Nurse Notes: No concerns

## 2022-07-31 NOTE — Patient Instructions (Addendum)
[name] , Thank you for taking time to come for your Medicare Wellness Visit. I appreciate your ongoing commitment to your health goals. Please review the following plan we discussed and let me know if I can assist you in the future.   These are the goals we discussed:  Goals      Blood Pressure < 150/90     Quit smoking / using tobacco     Patient has been smoking 1/2PPD due to covid.     Remain active and independent     Weight (lb) < 180 lb (81.6 kg)     Patient has lost 8 pounds and wants to lose another 8 more.         This is a list of the screening recommended for you and due dates:  Health Maintenance  Topic Date Due   Colon Cancer Screening  Never done   COVID-19 Vaccine (7 - 2023-24 season) 10/21/2021   Flu Shot  09/21/2022   Stool Blood Test  11/15/2022   Medicare Annual Wellness Visit  07/31/2023   DTaP/Tdap/Td vaccine (2 - Td or Tdap) 08/12/2024   Pneumonia Vaccine  Completed   DEXA scan (bone density measurement)  Completed   Hepatitis C Screening  Completed   Zoster (Shingles) Vaccine  Completed   HPV Vaccine  Aged Out    Advanced directives: Information on Advanced Care Planning can be found at [facility] of [facility] Advance Health Care Directives Advance Health Care Directives ([email or web address] Please bring a copy of your health care power of attorney and living will to the office to be added to your chart at your convenience.   Conditions/risks identified: Aim for 30 minutes of exercise or brisk walking, 6-8 glasses of water, and 5 servings of fruits and vegetables each day.  Next appointment: Follow up in one year for your annual wellness visit    Preventive Care 65 Years and Older, Female Preventive care refers to lifestyle choices and visits with your health care provider that can promote health and wellness. What does preventive care include? A yearly physical exam. This is also called an annual well check. Dental exams once or twice a  year. Routine eye exams. Ask your health care provider how often you should have your eyes checked. Personal lifestyle choices, including: Daily care of your teeth and gums. Regular physical activity. Eating a healthy diet. Avoiding tobacco and drug use. Limiting alcohol use. Practicing safe sex. Taking low-dose aspirin every day. Taking vitamin and mineral supplements as recommended by your health care provider. What happens during an annual well check? The services and screenings done by your health care provider during your annual well check will depend on your age, overall health, lifestyle risk factors, and family history of disease. Counseling  Your health care provider may ask you questions about your: Alcohol use. Tobacco use. Drug use. Emotional well-being. Home and relationship well-being. Sexual activity. Eating habits. History of falls. Memory and ability to understand (cognition). Work and work Astronomer. Reproductive health. Screening  You may have the following tests or measurements: Height, weight, and BMI. Blood pressure. Lipid and cholesterol levels. These may be checked every 5 years, or more frequently if you are over 76 years old. Skin check. Lung cancer screening. You may have this screening every year starting at age 4 if you have a 30-pack-year history of smoking and currently smoke or have quit within the past 15 years. Fecal occult blood test (FOBT) of the stool. You  may have this test every year starting at age 22. Flexible sigmoidoscopy or colonoscopy. You may have a sigmoidoscopy every 5 years or a colonoscopy every 10 years starting at age 34. Hepatitis C blood test. Hepatitis B blood test. Sexually transmitted disease (STD) testing. Diabetes screening. This is done by checking your blood sugar (glucose) after you have not eaten for a while (fasting). You may have this done every 1-3 years. Bone density scan. This is done to screen for  osteoporosis. You may have this done starting at age 13. Mammogram. This may be done every 1-2 years. Talk to your health care provider about how often you should have regular mammograms. Talk with your health care provider about your test results, treatment options, and if necessary, the need for more tests. Vaccines  Your health care provider may recommend certain vaccines, such as: Influenza vaccine. This is recommended every year. Tetanus, diphtheria, and acellular pertussis (Tdap, Td) vaccine. You may need a Td booster every 10 years. Zoster vaccine. You may need this after age 61. Pneumococcal 13-valent conjugate (PCV13) vaccine. One dose is recommended after age 4. Pneumococcal polysaccharide (PPSV23) vaccine. One dose is recommended after age 68. Talk to your health care provider about which screenings and vaccines you need and how often you need them. This information is not intended to replace advice given to you by your health care provider. Make sure you discuss any questions you have with your health care provider. Document Released: 03/05/2015 Document Revised: 10/27/2015 Document Reviewed: 12/08/2014 Elsevier Interactive Patient Education  2017 [place] Prevention in the Home Falls can cause injuries. They can happen to people of all ages. There are many things you can do to make your home safe and to help prevent falls. What can I do on the outside of my home? Regularly fix the edges of walkways and driveways and fix any cracks. Remove anything that might make you trip as you walk through a door, such as a raised step or threshold. Trim any bushes or trees on the path to your home. Use bright outdoor lighting. Clear any walking paths of anything that might make someone trip, such as rocks or tools. Regularly check to see if handrails are loose or broken. Make sure that both sides of any steps have handrails. Any raised decks and porches should have guardrails on  the edges. Have any leaves, snow, or ice cleared regularly. Use sand or salt on walking paths during winter. Clean up any spills in your garage right away. This includes oil or grease spills. What can I do in the bathroom? Use night lights. Install grab bars by the toilet and in the tub and shower. Do not use towel bars as grab bars. Use non-skid mats or decals in the tub or shower. If you need to sit down in the shower, use a plastic, non-slip stool. Keep the floor dry. Clean up any water that spills on the floor as soon as it happens. Remove soap buildup in the tub or shower regularly. Attach bath mats securely with double-sided non-slip rug tape. Do not have throw rugs and other things on the floor that can make you trip. What can I do in the bedroom? Use night lights. Make sure that you have a light by your bed that is easy to reach. Do not use any sheets or blankets that are too big for your bed. They should not hang down onto the floor. Have a firm chair that has side  arms. You can use this for support while you get dressed. Do not have throw rugs and other things on the floor that can make you trip. What can I do in the kitchen? Clean up any spills right away. Avoid walking on wet floors. Keep items that you use a lot in easy-to-reach places. If you need to reach something above you, use a strong step stool that has a grab bar. Keep electrical cords out of the way. Do not use floor polish or wax that makes floors slippery. If you must use wax, use non-skid floor wax. Do not have throw rugs and other things on the floor that can make you trip. What can I do with my stairs? Do not leave any items on the stairs. Make sure that there are handrails on both sides of the stairs and use them. Fix handrails that are broken or loose. Make sure that handrails are as long as the stairways. Check any carpeting to make sure that it is firmly attached to the stairs. Fix any carpet that is loose  or worn. Avoid having throw rugs at the top or bottom of the stairs. If you do have throw rugs, attach them to the floor with carpet tape. Make sure that you have a light switch at the top of the stairs and the bottom of the stairs. If you do not have them, ask someone to add them for you. What else can I do to help prevent falls? Wear shoes that: Do not have high heels. Have rubber bottoms. Are comfortable and fit you well. Are closed at the toe. Do not wear sandals. If you use a stepladder: Make sure that it is fully opened. Do not climb a closed stepladder. Make sure that both sides of the stepladder are locked into place. Ask someone to hold it for you, if possible. Clearly mark and make sure that you can see: Any grab bars or handrails. First and last steps. Where the edge of each step is. Use tools that help you move around (mobility aids) if they are needed. These include: Canes. Walkers. Scooters. Crutches. Turn on the lights when you go into a dark area. Replace any light bulbs as soon as they burn out. Set up your furniture so you have a clear path. Avoid moving your furniture around. If any of your floors are uneven, fix them. If there are any pets around you, be aware of where they are. Review your medicines with your doctor. Some medicines can make you feel dizzy. This can increase your chance of falling. Ask your doctor what other things that you can do to help prevent falls. This information is not intended to replace advice given to you by your health care provider. Make sure you discuss any questions you have with your health care provider. Document Released: 12/03/2008 Document Revised: 07/15/2015 Document Reviewed: 03/13/2014 Elsevier Interactive Patient Education  2017 [employer].

## 2022-09-15 DIAGNOSIS — H353231 Exudative age-related macular degeneration, bilateral, with active choroidal neovascularization: Secondary | ICD-10-CM | POA: Diagnosis not present

## 2022-10-24 ENCOUNTER — Other Ambulatory Visit: Payer: Self-pay | Admitting: Family Medicine

## 2022-10-24 MED ORDER — AMLODIPINE BESYLATE 10 MG PO TABS: 10.0000 mg | ORAL_TABLET | Freq: Every day | ORAL | 0 refills | Status: DC

## 2022-11-06 ENCOUNTER — Other Ambulatory Visit: Payer: Self-pay | Admitting: Family Medicine

## 2022-11-10 DIAGNOSIS — H353231 Exudative age-related macular degeneration, bilateral, with active choroidal neovascularization: Secondary | ICD-10-CM | POA: Diagnosis not present

## 2022-12-12 ENCOUNTER — Other Ambulatory Visit: Payer: Self-pay

## 2022-12-12 DIAGNOSIS — G4700 Insomnia, unspecified: Secondary | ICD-10-CM

## 2022-12-12 MED ORDER — GABAPENTIN 300 MG PO CAPS: 300.0000 mg | ORAL_CAPSULE | Freq: Three times a day (TID) | ORAL | 0 refills | Status: DC

## 2022-12-25 ENCOUNTER — Encounter: Payer: Self-pay | Admitting: Family Medicine

## 2022-12-25 ENCOUNTER — Ambulatory Visit (INDEPENDENT_AMBULATORY_CARE_PROVIDER_SITE_OTHER): Payer: 59 | Admitting: Family Medicine

## 2022-12-25 VITALS — BP 148/69 | HR 71 | Ht 65.0 in | Wt 139.4 lb

## 2022-12-25 DIAGNOSIS — Z1211 Encounter for screening for malignant neoplasm of colon: Secondary | ICD-10-CM | POA: Diagnosis not present

## 2022-12-25 DIAGNOSIS — F17200 Nicotine dependence, unspecified, uncomplicated: Secondary | ICD-10-CM | POA: Diagnosis not present

## 2022-12-25 DIAGNOSIS — G4700 Insomnia, unspecified: Secondary | ICD-10-CM | POA: Diagnosis not present

## 2022-12-25 DIAGNOSIS — I10 Essential (primary) hypertension: Secondary | ICD-10-CM | POA: Diagnosis not present

## 2022-12-25 DIAGNOSIS — E041 Nontoxic single thyroid nodule: Secondary | ICD-10-CM | POA: Diagnosis not present

## 2022-12-25 DIAGNOSIS — J302 Other seasonal allergic rhinitis: Secondary | ICD-10-CM

## 2022-12-25 DIAGNOSIS — Z0000 Encounter for general adult medical examination without abnormal findings: Secondary | ICD-10-CM | POA: Diagnosis not present

## 2022-12-25 DIAGNOSIS — F341 Dysthymic disorder: Secondary | ICD-10-CM

## 2022-12-25 LAB — POCT GLYCOSYLATED HEMOGLOBIN (HGB A1C): Hemoglobin A1C: 5.4 % (ref 4.0–5.6)

## 2022-12-25 MED ORDER — BUSPIRONE HCL 10 MG PO TABS: 10.0000 mg | ORAL_TABLET | Freq: Two times a day (BID) | ORAL | 3 refills | Status: DC

## 2022-12-25 MED ORDER — GABAPENTIN 300 MG PO CAPS: 300.0000 mg | ORAL_CAPSULE | Freq: Three times a day (TID) | ORAL | 3 refills | Status: DC

## 2022-12-25 MED ORDER — LISINOPRIL 20 MG PO TABS: 20.0000 mg | ORAL_TABLET | Freq: Every day | ORAL | 3 refills | Status: DC

## 2022-12-25 MED ORDER — AMLODIPINE BESYLATE 10 MG PO TABS: 10.0000 mg | ORAL_TABLET | Freq: Every day | ORAL | 2 refills | Status: DC

## 2022-12-25 NOTE — Assessment & Plan Note (Signed)
Last [place] 2023 with multi nodular thyroid. One nodule met criteria for surveillance.  - f/u US thyroid scheduled - TSH ordered

## 2022-12-25 NOTE — Assessment & Plan Note (Signed)
Counseled patient on importance of cessation. Patient expressed understanding but would like not assistance in quitting right now. Patient qualifies for CT lung cancer screening. - Ordered low dose CT lung

## 2022-12-25 NOTE — Patient Instructions (Addendum)
It was wonderful to see you today.  Please bring ALL of your medications with you to every visit.   Today we talked about:  Today at your annual preventive visit we talked about the following measures:  - We scheduled your thyroid ultrasound and CT lung cancer screening - I ordered your ColoGuard screening-  it should arrive in your home.  - Encouraged smoking cessation. It is great for your health to stop smoking! If you would like resources or help with this, we can help! Please call us!   - Exercise at least 30-45 minutes a day, 3-4 days a week. >150 min of moderate intensity per week is advised. - Eat a low-fat diet with lots of fruits and vegetables, up to 7-9 servings per day. - Seatbelts can save your life. Wear them always. - Smoke detectors on every level of your home, check batteries every year. - Eye Doctor - have an eye exam every 1-2 years - Safe sex - if you may be exposed to STDs, use a condom. - Alcohol If you drink, do it moderately, less than 1 drink per day. - Health Care Power of Attorney.  Choose someone to speak for you if you are not able. - Depression is common in our stressful world.If you're feeling down or losing interest in things you normally enjoy, please come in for a visit. - Violence - If anyone is threatening or hurting you, please call immediately.  Thank you for choosing [facility].   Please call [phone] with any questions about today's appointment.  Please arrive at least 15 minutes prior to your scheduled appointments.   If you had blood work today, I will send you a MyChart message or a letter if results are normal. Otherwise, I will give you a call.   If you had a referral placed, they will call you to set up an appointment. Please give [place] a call if you don't hear back in the next 2 weeks.   If you need additional refills before your next appointment, please call your pharmacy first.   Hal Morales, MD Family Medicine

## 2022-12-25 NOTE — Assessment & Plan Note (Addendum)
At goal (<150/90).  - continue lisinopril 20 mg and amlodipine 10 mg

## 2022-12-25 NOTE — Progress Notes (Cosign Needed Addendum)
SUBJECTIVE:   Chief compliant/HPI: annual examination  Alexis Cole is a 77 y.o. who presents today for an annual exam. Lives at home with service dog.  Smoking history: last 15 years- a pack every two days (10 a day) and has been on and off for about 60 years and is not interested right now in any resources to quit.  Has been 140 lbs and wants to stay at this weight. Takes bootcamp classes every day (one hour) and walks her dog daily. Says this brings her a lot of joy and hates it when she misses her classes. Would like cologuard for colon cancer screening. Still has back pain but the aerobics helps with back pain but she follows with Dr. Danielle Dess @ neurosurgery. She is not interested in surgery at this time. Is also having some difficulty falling asleep at night, and usually gets 3-5 hours of sleep without waking up. Would like to try tea and a bed time routine for now and discuss medications if this does not help.   Medications:   Lisinopril 20 mg AM  Gabapentin TID Amlodipine 10 mg  Etodolac 2x daily 300 mg   Buspar 1/2 in AM and 1/2 in afternoon, and one at night Afrin 3x/day  Imodium 2-3x/week   Not taking atorvastatin  History tabs reviewed and updated .   Review of systems form reviewed and notable for Review of Systems  Respiratory:  Negative for cough, shortness of breath and wheezing.   Cardiovascular:  Negative for chest pain, palpitations and leg swelling.  Musculoskeletal:  Positive for back pain, myalgias and neck pain.  Neurological:  Negative for dizziness, tingling, weakness and headaches.   OBJECTIVE:   BP (!) 148/69   Pulse 71   Ht 5\' 5"  (1.651 m)   Wt 139 lb 6 oz (63.2 kg)   SpO2 99%   BMI 23.19 kg/m       12/25/2022   11:48 AM 12/25/2022   10:43 AM  Vitals with BMI  Height  5\' 5"   Weight  139 lbs 6 oz  BMI  23.19  Systolic 148 146  Diastolic 69 70  Pulse  71    General: A&O, NAD HEENT: No sign of trauma, EOM grossly intact. Nodular  thyroid.  Cardiac: RRR, no m/r/g Respiratory: CTAB, normal WOB, no w/c/r GI: Soft, NTTP, non-distended  Extremities: NTTP, no peripheral edema. Neuro: Normal gait, moves all four extremities appropriately. Psych: Appropriate mood and affect ASSESSMENT/PLAN:   Essential hypertension, benign At goal (<150/90).  - continue lisinopril 20 mg and amlodipine 10 mg  Allergic rhinitis Discussed rebound congestion with use of Afrin (patient uses 3x/daily) and recommended to decrease use to 2-3x a week as needed and Flonase. Patient expressed understanding and reports this is the only thing that works for her. She reports she will continue on this.    Thyroid nodule Last [place] 2023 with multi nodular thyroid. One nodule met criteria for surveillance.  - f/u US thyroid scheduled - TSH ordered  Tobacco use disorder Counseled patient on importance of cessation. Patient expressed understanding but would like not assistance in quitting right now. Patient qualifies for CT lung cancer screening. - Ordered low dose CT lung   Annual Examination   F/u labs: TSH, A1C, CMP, CBC  See AVS for age appropriate recommendations  PHQ score 5, reviewed and discussed.  BP reviewed and at goal, will continue amlodipine and lisinopril.  Advance directives discussion completed, given [place] folder for information regarding  AD. Will discuss this with her son and a friend.    Considered the following items based upon USPSTF recommendations: Diabetes screening: ordered- A1c ordered  Screening for elevated cholesterol: ordered- Lipid panel ordered HIV testing:  neg 2018 Hepatitis C:  neg 2018  Hepatitis B:  not done  Syphilis if at high risk: discussed GC/CT not at high risk and not ordered. Osteoporosis screening considered, patient had DEXA scan in 11/2021 with T score -0.6%.  Reviewed risk factors for latent tuberculosis and not indicated  Discussed family history, BRCA testing not indicated.  Cervical cancer  screening: not indicated  Breast cancer screening: not interested at this time.  Colorectal cancer screening: discussed options, elected for cologuard Lung cancer screening: discussed. See documentation below regarding indications/risks/benefits.  Vaccinations UTD.   Follow up in 1 year or sooner if indicated.    Hal Morales, MD [facility]

## 2022-12-25 NOTE — Assessment & Plan Note (Signed)
Discussed rebound congestion with use of Afrin (patient uses 3x/daily) and recommended to decrease use to 2-3x a week as needed and Flonase. Patient expressed understanding and reports this is the only thing that works for her. She reports she will continue on this.

## 2022-12-26 LAB — COMPREHENSIVE METABOLIC PANEL
ALT: 14 [IU]/L (ref 0–32)
AST: 24 [IU]/L (ref 0–40)
Albumin: 4.8 g/dL (ref 3.8–4.8)
Alkaline Phosphatase: 92 [IU]/L (ref 44–121)
BUN/Creatinine Ratio: 13 (ref 12–28)
BUN: 11 mg/dL (ref 8–27)
Bilirubin Total: 0.5 mg/dL (ref 0.0–1.2)
CO2: 23 mmol/L (ref 20–29)
Calcium: 10.4 mg/dL — ABNORMAL HIGH (ref 8.7–10.3)
Chloride: 101 mmol/L (ref 96–106)
Creatinine, Ser: 0.84 mg/dL (ref 0.57–1.00)
Globulin, Total: 1.9 g/dL (ref 1.5–4.5)
Glucose: 94 mg/dL (ref 70–99)
Potassium: 4.5 mmol/L (ref 3.5–5.2)
Sodium: 137 mmol/L (ref 134–144)
Total Protein: 6.7 g/dL (ref 6.0–8.5)
eGFR: 72 mL/min/{1.73_m2} (ref >59)

## 2022-12-26 LAB — CBC
Hematocrit: 39.9 % (ref 34.0–46.6)
Hemoglobin: 13.5 g/dL (ref 11.1–15.9)
MCH: 31.9 pg (ref 26.6–33.0)
MCHC: 33.8 g/dL (ref 31.5–35.7)
MCV: 94 fL (ref 79–97)
Platelets: 174 10*3/uL (ref 150–450)
RBC: 4.23 x10E6/uL (ref 3.77–5.28)
RDW: 11.9 % (ref 11.7–15.4)
WBC: 7.4 10*3/uL (ref 3.4–10.8)

## 2022-12-26 LAB — LIPID PANEL
Chol/HDL Ratio: 1.8 ratio (ref 0.0–4.4)
Cholesterol, Total: 146 mg/dL (ref 100–199)
HDL: 83 mg/dL (ref >39)
LDL Chol Calc (NIH): 53 mg/dL (ref 0–99)
Triglycerides: 43 mg/dL (ref 0–149)
VLDL Cholesterol Cal: 10 mg/dL (ref 5–40)

## 2022-12-26 LAB — TSH: TSH: 0.572 u[IU]/mL (ref 0.450–4.500)

## 2022-12-27 ENCOUNTER — Encounter: Payer: Self-pay | Admitting: Family Medicine

## 2023-01-02 ENCOUNTER — Ambulatory Visit (HOSPITAL_COMMUNITY)
Admission: RE | Admit: 2023-01-02 | Discharge: 2023-01-02 | Disposition: A | Discharge status: Home / Self Care | Payer: 59 | Source: Ambulatory Visit | Attending: Family Medicine | Admitting: Family Medicine

## 2023-01-02 DIAGNOSIS — F17200 Nicotine dependence, unspecified, uncomplicated: Secondary | ICD-10-CM | POA: Diagnosis present

## 2023-01-02 DIAGNOSIS — I2510 Atherosclerotic heart disease of native coronary artery without angina pectoris: Secondary | ICD-10-CM | POA: Diagnosis not present

## 2023-01-02 DIAGNOSIS — E041 Nontoxic single thyroid nodule: Secondary | ICD-10-CM | POA: Insufficient documentation

## 2023-01-02 DIAGNOSIS — I700 Atherosclerosis of aorta: Secondary | ICD-10-CM | POA: Insufficient documentation

## 2023-01-02 DIAGNOSIS — R911 Solitary pulmonary nodule: Secondary | ICD-10-CM | POA: Insufficient documentation

## 2023-01-02 DIAGNOSIS — Z122 Encounter for screening for malignant neoplasm of respiratory organs: Secondary | ICD-10-CM | POA: Diagnosis not present

## 2023-01-02 DIAGNOSIS — E042 Nontoxic multinodular goiter: Secondary | ICD-10-CM | POA: Insufficient documentation

## 2023-01-02 DIAGNOSIS — F17210 Nicotine dependence, cigarettes, uncomplicated: Secondary | ICD-10-CM | POA: Diagnosis not present

## 2023-01-02 DIAGNOSIS — J439 Emphysema, unspecified: Secondary | ICD-10-CM | POA: Diagnosis not present

## 2023-01-03 DIAGNOSIS — Z1211 Encounter for screening for malignant neoplasm of colon: Secondary | ICD-10-CM | POA: Diagnosis not present

## 2023-01-05 DIAGNOSIS — H353231 Exudative age-related macular degeneration, bilateral, with active choroidal neovascularization: Secondary | ICD-10-CM | POA: Diagnosis not present

## 2023-01-11 ENCOUNTER — Telehealth: Payer: Self-pay | Admitting: Family Medicine

## 2023-01-11 ENCOUNTER — Encounter: Payer: Self-pay | Admitting: Family Medicine

## 2023-01-11 LAB — COLOGUARD: COLOGUARD: NEGATIVE

## 2023-01-11 NOTE — Telephone Encounter (Signed)
Patient returns call to nurse line.   PCP does not have any apts until the new year.   Patient asks you call her to discuss. She reports between 2:30-4pm would be a good time for her.   Advised will send to PCP and to keep her phone her throughout the remainder of the day.

## 2023-01-11 NOTE — Telephone Encounter (Signed)
Patient returns call to nurse line regarding results.   She states that she will be available for the rest of the day.   Veronda Prude, RN

## 2023-01-11 NOTE — Telephone Encounter (Signed)
Called patient to discuss results of recent thyroid ultrasound. Patient did not answer. Left VM to call office back to discuss these results and next steps.

## 2023-01-12 ENCOUNTER — Telehealth: Payer: Self-pay | Admitting: Family Medicine

## 2023-01-12 NOTE — Telephone Encounter (Signed)
Called patient and verified identity and date of birth.  Advised her of her recent thyroid ultrasound results and recommendation for follow-up with repeat thyroid ultrasound in 1 year to monitor nodules.  Patient understands and agrees.  Answered all questions to the best of my ability.

## 2023-01-28 ENCOUNTER — Telehealth: Payer: Self-pay | Admitting: Family Medicine

## 2023-01-28 ENCOUNTER — Encounter: Payer: Self-pay | Admitting: Family Medicine

## 2023-01-28 NOTE — Telephone Encounter (Signed)
Contacted patient to discuss results from CT lung results. Left VM for patient to call office back. Will also send mychart message

## 2023-01-29 NOTE — Telephone Encounter (Signed)
Patient calls nurse line in regards to results.   Patient advised PCP has tried to call her several times in regards to results.   Advised PCP is working nights this week.   Patient agreeable to an evening phone call.   Will forward to PCP.

## 2023-01-30 ENCOUNTER — Telehealth: Payer: Self-pay | Admitting: Family Medicine

## 2023-01-30 NOTE — Telephone Encounter (Signed)
Called patient to discuss results of lung CT scan. Patient expressed understanding regarding pulmonary nodule noted on CT Scan with recommendation for follow up scan in 3 months. Patient to schedule appointment with me in the next 1-2 months to further discuss results and tobacco cessation. Will also place order for CT Scan at that time. All questions answered.

## 2023-02-08 DIAGNOSIS — M5416 Radiculopathy, lumbar region: Secondary | ICD-10-CM | POA: Diagnosis not present

## 2023-02-08 DIAGNOSIS — M5116 Intervertebral disc disorders with radiculopathy, lumbar region: Secondary | ICD-10-CM | POA: Diagnosis not present

## 2023-03-02 DIAGNOSIS — H353231 Exudative age-related macular degeneration, bilateral, with active choroidal neovascularization: Secondary | ICD-10-CM | POA: Diagnosis not present

## 2023-03-06 ENCOUNTER — Encounter: Payer: Self-pay | Admitting: Family Medicine

## 2023-03-06 ENCOUNTER — Ambulatory Visit (INDEPENDENT_AMBULATORY_CARE_PROVIDER_SITE_OTHER): Payer: 59 | Admitting: Family Medicine

## 2023-03-06 VITALS — BP 120/76 | HR 69 | Ht 65.0 in | Wt 138.2 lb

## 2023-03-06 DIAGNOSIS — G4700 Insomnia, unspecified: Secondary | ICD-10-CM | POA: Diagnosis not present

## 2023-03-06 DIAGNOSIS — F17200 Nicotine dependence, unspecified, uncomplicated: Secondary | ICD-10-CM

## 2023-03-06 DIAGNOSIS — R911 Solitary pulmonary nodule: Secondary | ICD-10-CM

## 2023-03-06 NOTE — Assessment & Plan Note (Signed)
 Patient down to 5 cigarettes daily.  Counseled on importance of cessation of tobacco use.  Patient believes she will be able to quit on her own.  Annual CT scan suspicious for nodule noted in right lower lobe.  Scheduled follow-up CT scan for February.  As patient is not having symptoms at this time, we will continue to monitor and follow-up after CT scan.

## 2023-03-06 NOTE — Progress Notes (Signed)
    SUBJECTIVE:   CHIEF COMPLAINT / HPI:   Still having trouble sleeping. Has tried nyquil and that hasn't helped. Has been working on sleep hygiene but is still having a hard time staying asleep. Has been drinking caffeine to help with sleep. Sometimes has the TV on before bed to help fall asleep. Is interested in trying medication at this time. Reports trying melatonin in the past and this has not helped.  Patient has decreased amount of cigarettes per day to 5. Is working on quitting smoking. Denies any shortness of breath or trouble breathing. Denies any chest pain. Denies fevers, chills, weight loss or night sweats.   PERTINENT  PMH / PSH:  Tobacco use disorder Hypertension Multiple thyroid  nodules Osteoarthritis of hips and knees bilaterally   OBJECTIVE:   BP 120/76   Pulse 69   Ht 5' 5 (1.651 m)   Wt 138 lb 3.2 oz (62.7 kg)   SpO2 100%   BMI 23.00 kg/m   General: A&O, NAD HEENT: No sign of trauma, EOM grossly intact Cardiac: RRR, no m/r/g Respiratory: CTAB, normal WOB, GI: Soft, NTTP, non-distended  Extremities: no peripheral edema. Neuro: Normal gait, moves all four extremities appropriately. Psych: Appropriate mood and affect   ASSESSMENT/PLAN:   Tobacco use disorder Patient down to 5 cigarettes daily.  Counseled on importance of cessation of tobacco use.  Patient believes she will be able to quit on her own.  Annual CT scan suspicious for nodule noted in right lower lobe.  Scheduled follow-up CT scan for February.  As patient is not having symptoms at this time, we will continue to monitor and follow-up after CT scan.  Hypercalcemia Calcium  10.5 at last check.  Given patient does have history of concern of parathyroidism, will recheck calcium  and parathyroid  hormone today  Insomnia Patient having difficulty staying asleep.  Has tried tea in the past and that has not worked.  States she has tried melatonin up to 10 mg in the past but does not feel like that is  helpful either.  Patient does watch TV at bedtime.  Discussed importance of sleep hygiene.  Recommend sleep hygiene and CBT with melatonin as needed for now.  If patient continues to have symptoms with this regimen, consider pharmacotherapy.   Gloriann Ogren, MD [facility]

## 2023-03-06 NOTE — Assessment & Plan Note (Signed)
 Patient having difficulty staying asleep.  Has tried tea in the past and that has not worked.  States she has tried melatonin up to 10 mg in the past but does not feel like that is helpful either.  Patient does watch TV at bedtime.  Discussed importance of sleep hygiene.  Recommend sleep hygiene and CBT with melatonin as needed for now.  If patient continues to have symptoms with this regimen, consider pharmacotherapy.

## 2023-03-06 NOTE — Assessment & Plan Note (Signed)
 Calcium 10.5 at last check.  Given patient does have history of concern of parathyroidism, will recheck calcium and parathyroid hormone today

## 2023-03-06 NOTE — Patient Instructions (Signed)
 It was great to see you today! Thank you for choosing [facility] for your primary care. Alexis Cole was seen for follow up on sleep and lung study.  Today we addressed: I ordered a lung study for you, this is scheduled for February 7th at 1 PM For sleep, lets start by practicing sleep hygiene like we talked about. Some suggestions include limiting naps during the day, no screens atleast 30 mins to one hour before bedtime, having a dark sleeping environment, no large meals 1 hour before bed, avoid caffeine after 3pm. Trying sleep therapy apps like Calm can also be helpful. You can also try melatonin 5-10mg   over the counter. Take this 1-2 hours before bed. Your calcium  level was a bit high at your last visit. I will recheck this lab today.   If you haven't already, sign up for My Chart to have easy access to your labs results, and communication with your primary care physician.  We are checking some labs today. If they are abnormal, I will call you. If they are normal, I will send you a MyChart message (if it is active) or a letter in the mail. If you do not hear about your labs in the next 2 weeks, please call the office.   You should return to our clinic Return in about 1 month (around 04/06/2023), or if symptoms worsen or fail to improve.  I recommend that you always bring your medications to each appointment as this makes it easy to ensure you are on the correct medications and helps us  not miss refills when you need them.  Please arrive 15 minutes before your appointment to ensure smooth check in process.  We appreciate your efforts in making this happen.  Please call the clinic at [phone] if your symptoms worsen or you have any concerns.  Thank you for allowing me to participate in your care, Gloriann Ogren, MD 03/06/2023, 3:05 PM PGY-1, [facility]

## 2023-03-07 LAB — PTH, INTACT AND CALCIUM
Calcium: 10.9 mg/dL — ABNORMAL HIGH (ref 8.7–10.3)
PTH: 28 pg/mL (ref 15–65)

## 2023-03-08 ENCOUNTER — Encounter: Payer: Self-pay | Admitting: Family Medicine

## 2023-03-09 ENCOUNTER — Telehealth: Payer: Self-pay | Admitting: Family Medicine

## 2023-03-09 DIAGNOSIS — E041 Nontoxic single thyroid nodule: Secondary | ICD-10-CM

## 2023-03-09 NOTE — Addendum Note (Signed)
Addended by: Gerrit Heck on: 03/09/2023 12:54 PM   Modules accepted: Orders

## 2023-03-09 NOTE — Telephone Encounter (Signed)
Called patient and after confirming name and date of birth, discussed results of recent calcium and PTH tests. Given her history of thyroid nodule, there is a concern for hyperparathyroidism or active tumor, so after discussion, a referral to endocrinology for further evaluation was made. Patient understood and agreed with the plan.

## 2023-03-09 NOTE — Telephone Encounter (Signed)
Patient returns call to nurse line. Spoke with Dr. Hyacinth Meeker who said that she can call her back this afternoon.   Patient states that she should be available anytime this afternoon, preferably around 3.   Veronda Prude, RN

## 2023-03-09 NOTE — Telephone Encounter (Signed)
Called patient to discuss results of calcium and PTH.  Patient did not answer, left voicemail.  Patient will likely need to see endocrinology for further workup.  May need repeat parathyroidectomy.

## 2023-03-30 ENCOUNTER — Other Ambulatory Visit: Payer: Self-pay | Admitting: Family Medicine

## 2023-03-30 ENCOUNTER — Ambulatory Visit (HOSPITAL_COMMUNITY)
Admission: RE | Admit: 2023-03-30 | Discharge: 2023-03-30 | Disposition: A | Discharge status: Home / Self Care | Payer: 59 | Source: Ambulatory Visit | Attending: Family Medicine | Admitting: Family Medicine

## 2023-03-30 DIAGNOSIS — R918 Other nonspecific abnormal finding of lung field: Secondary | ICD-10-CM | POA: Diagnosis not present

## 2023-03-30 DIAGNOSIS — I700 Atherosclerosis of aorta: Secondary | ICD-10-CM | POA: Diagnosis not present

## 2023-03-30 DIAGNOSIS — R911 Solitary pulmonary nodule: Secondary | ICD-10-CM | POA: Diagnosis not present

## 2023-03-30 DIAGNOSIS — G4700 Insomnia, unspecified: Secondary | ICD-10-CM

## 2023-03-30 DIAGNOSIS — E049 Nontoxic goiter, unspecified: Secondary | ICD-10-CM | POA: Diagnosis not present

## 2023-03-30 DIAGNOSIS — F17200 Nicotine dependence, unspecified, uncomplicated: Secondary | ICD-10-CM

## 2023-04-12 ENCOUNTER — Ambulatory Visit: Payer: 59 | Admitting: Family Medicine

## 2023-04-12 ENCOUNTER — Telehealth: Payer: Self-pay

## 2023-04-12 NOTE — Telephone Encounter (Signed)
 Called patient twice she did not answer, LVM informing her appointment has been canceled today due to the weather and call back to reschedule. Penni Bombard CMA

## 2023-04-21 ENCOUNTER — Other Ambulatory Visit: Payer: Self-pay | Admitting: Family Medicine

## 2023-04-21 DIAGNOSIS — G4700 Insomnia, unspecified: Secondary | ICD-10-CM

## 2023-04-24 ENCOUNTER — Other Ambulatory Visit: Payer: Self-pay

## 2023-04-24 DIAGNOSIS — E041 Nontoxic single thyroid nodule: Secondary | ICD-10-CM

## 2023-04-26 ENCOUNTER — Ambulatory Visit: Payer: 59 | Admitting: Family Medicine

## 2023-04-26 ENCOUNTER — Encounter: Payer: Self-pay | Admitting: Family Medicine

## 2023-04-26 VITALS — BP 133/77 | HR 82 | Ht 65.0 in | Wt 141.8 lb

## 2023-04-26 DIAGNOSIS — F4321 Adjustment disorder with depressed mood: Secondary | ICD-10-CM | POA: Diagnosis not present

## 2023-04-26 DIAGNOSIS — F17200 Nicotine dependence, unspecified, uncomplicated: Secondary | ICD-10-CM

## 2023-04-26 NOTE — Patient Instructions (Addendum)
 It was great to see you today! Thank you for choosing [facility] for your primary care. Alexis Cole was seen for follow up.  Today we addressed: I will call you regarding your lung imaging results If you are not feeling better in the next two weeks and would like some help with grieving, please call the office and let us know For information on therapists, please go to [email or web address]. You can also contact your insurance company to find an Hospital doctor. We can talk about help with quitting smoking at the next visit   If you haven't already, sign up for My Chart to have easy access to your labs results, and communication with your primary care physician.  We are checking some labs today. If they are abnormal, I will call you. If they are normal, I will send you a MyChart message (if it is active) or a letter in the mail. If you do not hear about your labs in the next 2 weeks, please call the office.   You should return to our clinic Return if symptoms worsen or fail to improve.  I recommend that you always bring your medications to each appointment as this makes it easy to ensure you are on the correct medications and helps [place] not miss refills when you need them.  Please arrive 15 minutes before your appointment to ensure smooth check in process.  We appreciate your efforts in making this happen.  Please call the clinic at [phone] if your symptoms worsen or you have any concerns.  Thank you for allowing me to participate in your care, Hal Morales, MD 04/26/2023, 12:09 PM PGY-1, [facility]

## 2023-04-26 NOTE — Assessment & Plan Note (Signed)
 Grieving the loss of her brother. Provided [email or web address] as resource as patient is considering counseling. Discussed use of medication is also an option, she will call back if she feels she needs this.

## 2023-04-26 NOTE — Assessment & Plan Note (Signed)
 Awaiting results of CT scan. Will call patient with update

## 2023-04-26 NOTE — Progress Notes (Signed)
    SUBJECTIVE:   CHIEF COMPLAINT / HPI:   Patient here for results regarding CT scan lung. Brother was in ICU for the past four weeks and passed away last 05/20/23. Has been dealing with that along with new health concerns regarding her lungs and parathyroid.   PERTINENT  PMH / PSH:  Tobacco Use disorder  HTN   OBJECTIVE:   BP 133/77   Pulse 82   Ht 5\' 5"  (1.651 m)   Wt 141 lb 12.8 oz (64.3 kg)   SpO2 98%   BMI 23.60 kg/m   General: A&O, NAD HEENT: No sign of trauma, EOM grossly intact Cardiac: RRR, no m/r/g Respiratory: CTAB, normal WOB, no w/c/r GI: Soft, NTTP, non-distended  Extremities: NTTP, no peripheral edema. Neuro: Normal gait, moves all four extremities appropriately. Psych: Appropriate mood and affect   ASSESSMENT/PLAN:   Grief Grieving the loss of her brother. Provided [email or web address] as resource as patient is considering counseling. Discussed use of medication is also an option, she will call back if she feels she needs this.     Hal Morales, MD [facility]

## 2023-04-27 DIAGNOSIS — H353231 Exudative age-related macular degeneration, bilateral, with active choroidal neovascularization: Secondary | ICD-10-CM | POA: Diagnosis not present

## 2023-05-02 ENCOUNTER — Other Ambulatory Visit: Payer: 59

## 2023-05-02 DIAGNOSIS — E041 Nontoxic single thyroid nodule: Secondary | ICD-10-CM | POA: Diagnosis not present

## 2023-05-02 LAB — TSH: TSH: 0.51 m[IU]/L (ref 0.40–4.50)

## 2023-05-02 LAB — T3, FREE: T3, Free: 2.9 pg/mL (ref 2.3–4.2)

## 2023-05-02 LAB — T4, FREE: Free T4: 1 ng/dL (ref 0.8–1.8)

## 2023-05-03 ENCOUNTER — Telehealth: Payer: Self-pay

## 2023-05-03 NOTE — Telephone Encounter (Signed)
 Patient calls nurse line regarding results from CT Chest. This exam was performed on 03/30/23.   She is asking if there is an update for receiving these results.   Will forward to PCP.   Veronda Prude, RN

## 2023-05-04 ENCOUNTER — Ambulatory Visit (INDEPENDENT_AMBULATORY_CARE_PROVIDER_SITE_OTHER): Payer: 59 | Admitting: "Endocrinology

## 2023-05-04 VITALS — BP 130/80 | HR 68 | Ht 65.0 in | Wt 141.0 lb

## 2023-05-04 DIAGNOSIS — E042 Nontoxic multinodular goiter: Secondary | ICD-10-CM

## 2023-05-04 NOTE — Progress Notes (Signed)
 Outpatient Endocrinology Note Alexis [place], MD  05/07/23   [name] 1945-03-05 [id number]  Referring Provider: McDiarmid, Leighton Roach, MD Primary Care Provider: Penne Lash, MD Subjective  No chief complaint on file.   Assessment & Plan  Diagnoses and all orders for this visit:  Multinodular goiter -     [place] FNA BX THYROID 1ST LESION AFIRMA; Future -     [place] FNA BIOPSY THYROID EA ADD LESION AFIRMA; Future   [name] is currently taking not taking any thyroid medication. Patient is currently biochemically euthyroid.  Educated on thyroid axis.  Never been on any medication, no indication for it   Has history of multinodular goiter  S/p FNA in 11/13/2019: THYROID,RIGHT INFERIOR: Scant follicular epithelium present (Bethesda category I) re-biopsied on 12/09/2019 Right inferior 2.7cm; Other 2 dimensions: 2.3 x 1.7cm:  Consistent with benign follicular nodule (Bethesda category II)  12/2022 repeat thyroid U/S reported thyromegaly with multiple nodules  04/2023 Ordered FNA for Right; Mid 1.8 cm x 1.1 x 0.9 cm, previously, 1.8 x 1.6 x 1.0 cm 04/2023 Ordered FNA for 2021 FNA -ve THYROID,RIGHT INFERIOR given >50% increase in volume (2025 at 3.7 cm x 2.6 cm x 4.4cm ), previously at  2.7cm x 2.3 x 1.7cm Patient understands and agreed   I have reviewed current medications, nurse's notes, allergies, vital signs, past medical and surgical history, family medical history, and social history for this encounter. Counseled patient on symptoms, examination findings, lab findings, imaging results, treatment decisions and monitoring and prognosis. The patient understood the recommendations and agrees with the treatment plan. All questions regarding treatment plan were fully answered.   Return in about 1 year (around 05/03/2024).   Alexis , MD  05/07/23   I have reviewed current medications, nurse's notes, allergies, vital signs, past medical and surgical history, family  medical history, and social history for this encounter. Counseled patient on symptoms, examination findings, lab findings, imaging results, treatment decisions and monitoring and prognosis. The patient understood the recommendations and agrees with the treatment plan. All questions regarding treatment plan were fully answered.   History of Present Illness Alexis Cole is a 78 y.o. year old female who presents to our clinic with multinodular goiter diagnosed in 10/2019.    Never been on any thyroid medication  Feels great No active complaints   Compressive symptoms:  dysphagia  No dysphonia  Yes positional dyspnea (especially with simultaneous arms elevation)  No  Smokes  Yes On biotin  Yes Personal history of head/neck surgery/irradiation  No   Physical Exam  BP 130/80   Pulse 68   Ht 5\' 5"  (1.651 m)   Wt 141 lb (64 kg)   SpO2 97%   BMI 23.46 kg/m  Constitutional: well developed, well nourished Head: normocephalic, atraumatic, no exophthalmos Eyes: sclera anicteric, no redness Neck: + thyromegaly, - thyroid tenderness; + nodules palpated Lungs: normal respiratory effort Neurology: alert and oriented, no fine hand tremor Skin: dry, no appreciable rashes Musculoskeletal: no appreciable defects Psychiatric: normal mood and affect  Allergies Allergies  Allergen Reactions   Tape Rash    Current Medications Patient's Medications  New Prescriptions   No medications on file  Previous Medications   AMLODIPINE (NORVASC) 10 MG TABLET    Take 1 tablet (10 mg total) by mouth at bedtime.   ASPIRIN EC 325 MG TABLET    Take 1 tablet (325 mg total) by mouth 2 (two) times daily after a meal. Take x  1 month post op to decrease risk of blood clots.   ATORVASTATIN (LIPITOR) 40 MG TABLET    TAKE 1 TABLET BY MOUTH EVERY DAY   BIOTIN PO    Take 500 mcg by mouth at bedtime.    BUSPIRONE (BUSPAR) 10 MG TABLET    Take 1 tablet (10 mg total) by mouth 2 (two) times daily.    CHOLECALCIFEROL (VITAMIN D3) 50 MCG (2000 UT) CAPSULE    Take 2,000 Units by mouth at bedtime.    COENZYME Q10 (CO Q-10) 300 MG CAPS    Take 300 mg by mouth at bedtime.   FLUTICASONE (FLONASE) 50 MCG/ACT NASAL SPRAY    PLACE 1 SPRAY INTO BOTH NOSTRILS DAILY AS NEEDED FOR ALLERGIES OR RHINITIS.   FOLIC ACID (FOLVITE) 400 MCG TABLET    Take 400 mcg by mouth at bedtime.    GABAPENTIN (NEURONTIN) 300 MG CAPSULE    TAKE 1 CAPSULE BY MOUTH THREE TIMES A DAY   KRILL OIL 350 MG CAPS    Take 350 mg by mouth at bedtime.    LISINOPRIL (ZESTRIL) 20 MG TABLET    Take 1 tablet (20 mg total) by mouth daily.   MULTIPLE VITAMINS-MINERALS (HEALTHY EYES PO)    Take 1 tablet by mouth at bedtime.  Modified Medications   No medications on file  Discontinued Medications   No medications on file    Past Medical History Past Medical History:  Diagnosis Date   Acne    but not being treated for   Acute recurrent sinusitis 08/13/2014   Anemia    history of--many yrs ago   Anxiety    but doesn't take any meds for this   Aortic atherosclerosis (HCC) 06/08/2016   noted on CT abd/pelvis   Arthritis    Knees, hips back, shoulder   Arthropathy, lower leg 02/10/2009   For eye care pt is going to Dr. Dione Booze, and her Ortho surgeon is Dr. Jodi Geralds and [facility].     Back pain 11/17/2015   Breast mass in female 08/20/2014   IMPRESSION: Further evaluation is suggested for possible mass in the left breast.The patient will be contacted regarding the findings, and additional imaging will be scheduled.  BI-RADS CATEGORY  0: Incomplete. Need additional imaging evaluation and/or prior mammograms for comparison.    Chronic back pain    Complication of anesthesia    States she woke up just after parathyroid surgery while still in OR and attempted to get off the bed, remebers seeing staffs back towards her   Depression    Diarrhea    Diverticulitis 06/08/2016   noted on CT abd/pelvis   Diverticulosis 06/08/2016    noted on CT abd/pelvis   Dyslipidemia    Dyspnea 10/26/2017   Estrogen deficiency 08/13/2014   Fatigue 02/24/2016   Fatty liver 06/08/2016   severe noted on CT abd/pelvis    Gastric ulcer    history of--many yrs ago   General weakness 06/08/2016   Headache(784.0)    History of migraine   Heart murmur    History of bronchitis    last time 6months   History of syncope    Hx of migraines    last one a month ago   Hypercalcemia 12/12/2016   Hyperparathyroidism (HCC) 12/12/2016   Hypertension     Dr     Annia Friendly   Increased endometrial stripe thickness 06/13/2016   Insomnia    Nyquil nightly   Joint pain  Joint swelling    Lactose intolerance    Nausea and vomiting    Obesity    Pneumonia 03/15/2016   left lower lobe, left lower lobe atelectasis, elevated left hemidiaphragm noted again, small left pleural effusion   Pre-syncope 04/12/2016   PTSD (post-traumatic stress disorder)    Saddle anesthesia 06/28/2018   Sinusitis    Urge incontinence of urine     Past Surgical History Past Surgical History:  Procedure Laterality Date   CATARACT EXTRACTION, BILATERAL     CERVICAL CONE BIOPSY     DILATION AND CURETTAGE OF UTERUS  30+yrs ago   MOUTH SURGERY     NASAL SEPTUM SURGERY  40+yrs ago   PARATHYROIDECTOMY Left 01/04/2017   Procedure: LEFT SUPERIOR PARATHYROIDECTOMY;  Surgeon: Darnell Level, MD;  Location: [facility];  Service: General;  Laterality: Left;   TOTAL HIP ARTHROPLASTY  09/01/2011   Procedure: TOTAL HIP ARTHROPLASTY;  Surgeon: Harvie Junior, MD;  Location: [facility];  Service: Orthopedics;  Laterality: Right;   TOTAL HIP ARTHROPLASTY Left 07/26/2018   Procedure: TOTAL HIP ARTHROPLASTY ANTERIOR APPROACH;  Surgeon: Jodi Geralds, MD;  Location: [facility];  Service: Orthopedics;  Laterality: Left;   TOTAL KNEE ARTHROPLASTY Left 03/09/2017   Procedure: LEFT TOTAL KNEE ARTHROPLASTY;  Surgeon: Jodi Geralds, MD;  Location: [facility];  Service: Orthopedics;  Laterality: Left;   TOTAL KNEE  ARTHROPLASTY Right 07/18/2019   Procedure: TOTAL KNEE ARTHROPLASTY;  Surgeon: Jodi Geralds, MD;  Location: [facility];  Service: Orthopedics;  Laterality: Right;   TUBAL LIGATION  30+yrs ago    Family History family history includes Arthritis-Osteo in her mother; Cancer - Prostate in her father; Diabetes Mellitus II in her maternal grandmother; Heart attack in her maternal grandfather; High blood pressure in her brother and son; Hypertension in her mother; Irritable bowel syndrome in her mother.  Social History Social History   Socioeconomic History   Marital status: Divorced    Spouse name: Not on file   Number of children: 1   Years of education: 12   Highest education level: High school graduate  Occupational History   Occupation: Producer, television/film/video     Comment: retired    Occupation: Paediatric nurse     Comment: retired   Tobacco Use   Smoking status: Every Day    Current packs/day: 0.50    Average packs/day: 0.5 packs/day for 55.2 years (27.6 ttl pk-yrs)    Types: Cigarettes    Start date: 02/21/1968    Passive exposure: Current   Smokeless tobacco: Never   Tobacco comments:    quit in the past for 9 years.  Previous 1ppd  Vaping Use   Vaping status: Never Used  Substance and Sexual Activity   Alcohol use: No    Alcohol/week: 0.0 standard drinks of alcohol   Drug use: No   Sexual activity: Not Currently    Birth control/protection: Post-menopausal  Other Topics Concern   Not on file  Social History Narrative   Patient lives alone in [place].    The patient does have a dog named Cardio who brings her great joy.   Patients only child, a son, lives out in [place]. They talk on the phone several times a week.    The patient is Buddhist and practices daily meditation.    The patient enjoys walking her dog, zumba classes and swimming.        Social Drivers of Health   Financial Resource Strain: Low Risk  (07/31/2022)  Overall Financial Resource Strain (CARDIA)    Difficulty  of Paying Living Expenses: Not hard at all  Food Insecurity: No Food Insecurity (07/31/2022)   Hunger Vital Sign    Worried About Running Out of Food in the Last Year: Never true    Ran Out of Food in the Last Year: Never true  Transportation Needs: No Transportation Needs (07/31/2022)   PRAPARE - Administrator, Civil Service (Medical): No    Lack of Transportation (Non-Medical): No  Physical Activity: Sufficiently Active (07/31/2022)   Exercise Vital Sign    Days of Exercise per Week: 6 days    Minutes of Exercise per Session: 40 min  Stress: No Stress Concern Present (07/31/2022)   [employer] of Occupational Health - Occupational Stress Questionnaire    Feeling of Stress : Not at all  Social Connections: Socially Isolated (07/31/2022)   Social Connection and Isolation Panel [NHANES]    Frequency of Communication with Friends and Family: More than three times a week    Frequency of Social Gatherings with Friends and Family: Three times a week    Attends Religious Services: Never    Active Member of Clubs or Organizations: No    Attends Banker Meetings: Never    Marital Status: Divorced  Intimate Partner Violence: Not At Risk (07/31/2022)   Humiliation, Afraid, Rape, and Kick questionnaire    Fear of Current or Ex-Partner: No    Emotionally Abused: No    Physically Abused: No    Sexually Abused: No    Laboratory Investigations Lab Results  Component Value Date   TSH 0.51 05/02/2023   TSH 0.572 12/25/2022   TSH 1.140 05/25/2021   FREET4 1.0 05/02/2023   FREET4 1.17 11/26/2019     No results found for: "TSI"   No components found for: "TRAB"   Lab Results  Component Value Date   CHOL 146 12/25/2022   Lab Results  Component Value Date   HDL 83 12/25/2022   Lab Results  Component Value Date   LDLCALC 53 12/25/2022   Lab Results  Component Value Date   TRIG 43 12/25/2022   Lab Results  Component Value Date   CHOLHDL 1.8 12/25/2022    Lab Results  Component Value Date   CREATININE 0.84 12/25/2022   No results found for: "GFR"    Component Value Date/Time   NA 137 12/25/2022 1152   K 4.5 12/25/2022 1152   CL 101 12/25/2022 1152   CO2 23 12/25/2022 1152   GLUCOSE 94 12/25/2022 1152   GLUCOSE 134 (H) 07/10/2019 1041   BUN 11 12/25/2022 1152   CREATININE 0.84 12/25/2022 1152   CREATININE 0.88 04/17/2016 1001   CALCIUM 10.9 (H) 03/06/2023 1517   PROT 6.7 12/25/2022 1152   ALBUMIN 4.8 12/25/2022 1152   AST 24 12/25/2022 1152   ALT 14 12/25/2022 1152   ALKPHOS 92 12/25/2022 1152   BILITOT 0.5 12/25/2022 1152   GFRNONAA 56 (L) 07/10/2019 1041   GFRNONAA 58 (L) 04/13/2016 1559   GFRAA >60 07/10/2019 1041   GFRAA 67 04/13/2016 1559      Latest Ref Rng & Units 03/06/2023    3:17 PM 12/25/2022   11:52 AM 05/24/2020    9:35 AM  BMP  Glucose 70 - 99 mg/dL  94  161   BUN 8 - 27 mg/dL  11  17   Creatinine 0.96 - 1.00 mg/dL  0.45  4.09   BUN/Creat Ratio 12 -  28  13  20    Sodium 134 - 144 mmol/L  137  139   Potassium 3.5 - 5.2 mmol/L  4.5  4.3   Chloride 96 - 106 mmol/L  101  102   CO2 20 - 29 mmol/L  23  20   Calcium 8.7 - 10.3 mg/dL 40.9  81.1  91.4        Component Value Date/Time   WBC 7.4 12/25/2022 1152   WBC 12.8 (H) 07/19/2019 0314   RBC 4.23 12/25/2022 1152   RBC 3.89 07/19/2019 0314   HGB 13.5 12/25/2022 1152   HCT 39.9 12/25/2022 1152   PLT 174 12/25/2022 1152   MCV 94 12/25/2022 1152   MCH 31.9 12/25/2022 1152   MCH 30.8 07/19/2019 0314   MCHC 33.8 12/25/2022 1152   MCHC 31.8 07/19/2019 0314   RDW 11.9 12/25/2022 1152   LYMPHSABS 3.1 07/10/2019 1041   LYMPHSABS 4.3 (H) 02/15/2018 1529   MONOABS 0.7 07/10/2019 1041   EOSABS 0.1 07/10/2019 1041   EOSABS 0.3 02/15/2018 1529   BASOSABS 0.1 07/10/2019 1041   BASOSABS 0.1 02/15/2018 1529      Parts of this note may have been dictated using voice recognition software. There may be variances in spelling and vocabulary which are unintentional.  Not all errors are proofread. Please notify the Thereasa Parkin if any discrepancies are noted or if the meaning of any statement is not clear.

## 2023-05-07 ENCOUNTER — Encounter: Payer: Self-pay | Admitting: Family Medicine

## 2023-05-07 ENCOUNTER — Telehealth: Payer: Self-pay

## 2023-05-07 ENCOUNTER — Encounter: Payer: Self-pay | Admitting: "Endocrinology

## 2023-05-07 NOTE — Telephone Encounter (Signed)
 Lvm for pt to call office back.Marland Kitchen

## 2023-05-07 NOTE — Telephone Encounter (Signed)
-----   Message from [facility] sent at 05/07/2023  1:08 PM EDT ----- Please let the patient know that needs biopsy for two thyroid nodules. I have ordered it. Thanks

## 2023-05-09 ENCOUNTER — Telehealth: Payer: Self-pay

## 2023-05-09 NOTE — Telephone Encounter (Signed)
 Pt informed of biopsy orders.

## 2023-05-09 NOTE — Telephone Encounter (Signed)
-----   Message from [facility] sent at 05/07/2023  1:08 PM EDT ----- Please let the patient know that needs biopsy for two thyroid nodules. I have ordered it. Thanks

## 2023-05-16 ENCOUNTER — Other Ambulatory Visit: Payer: Self-pay

## 2023-05-16 MED ORDER — ATORVASTATIN CALCIUM 40 MG PO TABS: 40.0000 mg | ORAL_TABLET | Freq: Every day | ORAL | 3 refills | Status: AC

## 2023-05-16 NOTE — Telephone Encounter (Deleted)
 Chart reviewed. Rx refilled.

## 2023-05-16 NOTE — Telephone Encounter (Signed)
 Chart reviewed. Rx refilled.

## 2023-05-23 ENCOUNTER — Ambulatory Visit
Admission: RE | Admit: 2023-05-23 | Discharge: 2023-05-23 | Disposition: A | Discharge status: Home / Self Care | Source: Ambulatory Visit | Attending: "Endocrinology | Admitting: "Endocrinology

## 2023-05-23 ENCOUNTER — Other Ambulatory Visit (HOSPITAL_COMMUNITY)
Admission: RE | Admit: 2023-05-23 | Discharge: 2023-05-23 | Disposition: A | Discharge status: Home / Self Care | Source: Ambulatory Visit | Attending: "Endocrinology | Admitting: "Endocrinology

## 2023-05-23 DIAGNOSIS — E042 Nontoxic multinodular goiter: Secondary | ICD-10-CM

## 2023-05-23 DIAGNOSIS — E041 Nontoxic single thyroid nodule: Secondary | ICD-10-CM | POA: Diagnosis not present

## 2023-05-25 LAB — CYTOLOGY - NON PAP

## 2023-05-30 ENCOUNTER — Telehealth: Payer: Self-pay

## 2023-05-30 NOTE — Telephone Encounter (Signed)
 Copied from CRM [id number]. Topic: General - Call Back - No Documentation >> May 29, 2023  3:47 PM Brittney F wrote: Reason for CRM:  Patient called in stating that she had a missed call from [place] but no telephone encounter is present in the patient's chart.   Callback Number: [phone]

## 2023-07-04 DIAGNOSIS — H353231 Exudative age-related macular degeneration, bilateral, with active choroidal neovascularization: Secondary | ICD-10-CM | POA: Diagnosis not present

## 2023-07-26 ENCOUNTER — Ambulatory Visit

## 2023-07-26 VITALS — Ht 65.0 in | Wt 136.0 lb

## 2023-07-26 DIAGNOSIS — Z0000 Encounter for general adult medical examination without abnormal findings: Secondary | ICD-10-CM | POA: Diagnosis not present

## 2023-07-26 NOTE — Progress Notes (Signed)
 Because this visit was a virtual/telehealth visit,  certain criteria was not obtained, such a blood pressure, CBG if applicable, and timed get up and go. Any medications not marked as "taking" were not mentioned during the medication reconciliation part of the visit. Any vitals not documented were not able to be obtained due to this being a telehealth visit or patient was unable to self-report a recent blood pressure reading due to a lack of equipment at home via telehealth. Vitals that have been documented are verbally provided by the patient.   Subjective:   Alexis Cole is a 78 y.o. who presents for a Medicare Wellness preventive visit.  As a reminder, Annual Wellness Visits don't include a physical exam, and some assessments may be limited, especially if this visit is performed virtually. We may recommend an in-person follow-up visit with your provider if needed.  Visit Complete: Virtual I connected with  Alexis Cole on 07/26/23 by a audio enabled telemedicine application and verified that I am speaking with the correct person using two identifiers.  Patient Location: Home  Provider Location: Office/Clinic  I discussed the limitations of evaluation and management by telemedicine. The patient expressed understanding and agreed to proceed.  Vital Signs: Because this visit was a virtual/telehealth visit, some criteria may be missing or patient reported. Any vitals not documented were not able to be obtained and vitals that have been documented are patient reported.  VideoDeclined- This patient declined Librarian, academic. Therefore the visit was completed with audio only.  Persons Participating in Visit: Patient.  AWV Questionnaire: No: Patient Medicare AWV questionnaire was not completed prior to this visit.  Cardiac Risk Factors include: advanced age (>95men, >64 women);hypertension;dyslipidemia;smoking/ tobacco exposure;family history of premature  cardiovascular disease     Objective:     Today's Vitals   07/26/23 1629  Weight: 136 lb (61.7 kg)  Height: 5\' 5"  (1.651 m)  PainSc: 2   PainLoc: Generalized   Body mass index is 22.63 kg/m.     07/26/2023    4:31 PM 04/26/2023   11:38 AM 12/25/2022   10:45 AM 07/31/2022    4:15 PM 11/10/2021    4:23 PM 05/25/2021    2:33 PM 04/19/2021    9:21 AM  Advanced Directives  Does Patient Have a Medical Advance Directive? No No No No No No No  Would patient like information on creating a medical advance directive? No - Patient declined No - Patient declined No - Patient declined Yes (MAU/Ambulatory/Procedural Areas - Information given)  No - Patient declined Yes (MAU/Ambulatory/Procedural Areas - Information given)    Current Medications (verified) Outpatient Encounter Medications as of 07/26/2023  Medication Sig   amLODipine  (NORVASC ) 10 MG tablet Take 1 tablet (10 mg total) by mouth at bedtime.   aspirin  EC 325 MG tablet Take 1 tablet (325 mg total) by mouth 2 (two) times daily after a meal. Take x 1 month post op to decrease risk of blood clots.   atorvastatin  (LIPITOR) 40 MG tablet Take 1 tablet (40 mg total) by mouth daily.   BIOTIN PO Take 500 mcg by mouth at bedtime.    busPIRone  (BUSPAR ) 10 MG tablet Take 1 tablet (10 mg total) by mouth 2 (two) times daily.   Cholecalciferol (VITAMIN D3) 50 MCG (2000 UT) capsule Take 2,000 Units by mouth at bedtime.    Coenzyme Q10 (CO Q-10) 300 MG CAPS Take 300 mg by mouth at bedtime.   fluticasone  (FLONASE ) 50 MCG/ACT  nasal spray PLACE 1 SPRAY INTO BOTH NOSTRILS DAILY AS NEEDED FOR ALLERGIES OR RHINITIS.   folic acid (FOLVITE) 400 MCG tablet Take 400 mcg by mouth at bedtime.    gabapentin  (NEURONTIN ) 300 MG capsule TAKE 1 CAPSULE BY MOUTH THREE TIMES A DAY   Krill Oil 350 MG CAPS Take 350 mg by mouth at bedtime.    lisinopril  (ZESTRIL ) 20 MG tablet Take 1 tablet (20 mg total) by mouth daily.   Multiple Vitamins-Minerals (HEALTHY EYES PO) Take 1  tablet by mouth at bedtime.   [DISCONTINUED] amLODipine  (NORVASC ) 5 MG tablet TAKE 1 TABLET (5 MG TOTAL) BY MOUTH DAILY.   No facility-administered encounter medications on file as of 07/26/2023.    Allergies (verified) Tape   History: Past Medical History:  Diagnosis Date   Acne    but not being treated for   Acute recurrent sinusitis 08/13/2014   Anemia    history of--many yrs ago   Anxiety    but doesn't take any meds for this   Aortic atherosclerosis (HCC) 06/08/2016   noted on CT abd/pelvis   Arthritis    Knees, hips back, shoulder   Arthropathy, lower leg 02/10/2009   For eye care pt is going to Dr. Candi Chafe, and her Ortho surgeon is Dr. Neil Balls and [facility].     Back pain 11/17/2015   Breast mass in female 08/20/2014   IMPRESSION: Further evaluation is suggested for possible mass in the left breast.The patient will be contacted regarding the findings, and additional imaging will be scheduled.  BI-RADS CATEGORY  0: Incomplete. Need additional imaging evaluation and/or prior mammograms for comparison.    Chronic back pain    Complication of anesthesia    States she woke up just after parathyroid  surgery while still in OR and attempted to get off the bed, remebers seeing staffs back towards her   Depression    Diarrhea    Diverticulitis 06/08/2016   noted on CT abd/pelvis   Diverticulosis 06/08/2016   noted on CT abd/pelvis   Dyslipidemia    Dyspnea 10/26/2017   Estrogen deficiency 08/13/2014   Fatigue 02/24/2016   Fatty liver 06/08/2016   severe noted on CT abd/pelvis    Gastric ulcer    history of--many yrs ago   General weakness 06/08/2016   Headache(784.0)    History of migraine   Heart murmur    History of bronchitis    last time 6months   History of syncope    Hx of migraines    last one a month ago   Hypercalcemia 12/12/2016   Hyperparathyroidism (HCC) 12/12/2016   Hypertension     Dr     Alycia Jude   Increased endometrial stripe thickness  06/13/2016   Insomnia    Nyquil nightly   Joint pain    Joint swelling    Lactose intolerance    Nausea and vomiting    Obesity    Pneumonia 03/15/2016   left lower lobe, left lower lobe atelectasis, elevated left hemidiaphragm noted again, small left pleural effusion   Pre-syncope 04/12/2016   PTSD (post-traumatic stress disorder)    Saddle anesthesia 06/28/2018   Sinusitis    Urge incontinence of urine    Past Surgical History:  Procedure Laterality Date   CATARACT EXTRACTION, BILATERAL     CERVICAL CONE BIOPSY     DILATION AND CURETTAGE OF UTERUS  30+yrs ago   MOUTH SURGERY     NASAL SEPTUM SURGERY  40+yrs ago  PARATHYROIDECTOMY Left 01/04/2017   Procedure: LEFT SUPERIOR PARATHYROIDECTOMY;  Surgeon: Oralee Billow, MD;  Location: [facility];  Service: General;  Laterality: Left;   TOTAL HIP ARTHROPLASTY  09/01/2011   Procedure: TOTAL HIP ARTHROPLASTY;  Surgeon: Boston Byers, MD;  Location: [facility];  Service: Orthopedics;  Laterality: Right;   TOTAL HIP ARTHROPLASTY Left 07/26/2018   Procedure: TOTAL HIP ARTHROPLASTY ANTERIOR APPROACH;  Surgeon: Neil Balls, MD;  Location: [facility];  Service: Orthopedics;  Laterality: Left;   TOTAL KNEE ARTHROPLASTY Left 03/09/2017   Procedure: LEFT TOTAL KNEE ARTHROPLASTY;  Surgeon: Neil Balls, MD;  Location: [facility];  Service: Orthopedics;  Laterality: Left;   TOTAL KNEE ARTHROPLASTY Right 07/18/2019   Procedure: TOTAL KNEE ARTHROPLASTY;  Surgeon: Neil Balls, MD;  Location: [facility];  Service: Orthopedics;  Laterality: Right;   TUBAL LIGATION  30+yrs ago   Family History  Problem Relation Age of Onset   Hypertension Mother    Arthritis-Osteo Mother    Irritable bowel syndrome Mother    Cancer - Prostate Father    High blood pressure Brother    High blood pressure Son    Diabetes Mellitus II Maternal Grandmother    Heart attack Maternal Grandfather    Hyperlipidemia Neg Hx    Diabetes Neg Hx    Sudden death Neg Hx    Breast cancer Neg Hx     Social History   Socioeconomic History   Marital status: Divorced    Spouse name: Not on file   Number of children: 1   Years of education: 12   Highest education level: High school graduate  Occupational History   Occupation: Producer, television/film/video     Comment: retired    Occupation: Paediatric nurse     Comment: retired   Tobacco Use   Smoking status: Every Day    Current packs/day: 0.50    Average packs/day: 0.5 packs/day for 55.4 years (27.7 ttl pk-yrs)    Types: Cigarettes    Start date: 02/21/1968    Passive exposure: Current   Smokeless tobacco: Never   Tobacco comments:    quit in the past for 9 years.  Previous 1ppd  Vaping Use   Vaping status: Never Used  Substance and Sexual Activity   Alcohol use: No    Alcohol/week: 0.0 standard drinks of alcohol   Drug use: No   Sexual activity: Not Currently    Birth control/protection: Post-menopausal  Other Topics Concern   Not on file  Social History Narrative   Patient lives alone in [place].    The patient does have a dog named Cardio who brings her great joy.   Patients only child, a son, lives out in [place]. They talk on the phone several times a week.    The patient is Buddhist and practices daily meditation.    The patient enjoys walking her dog, zumba classes and swimming.        Social Drivers of Corporate investment banker Strain: Low Risk  (07/26/2023)   Overall Financial Resource Strain (CARDIA)    Difficulty of Paying Living Expenses: Not hard at all  Food Insecurity: No Food Insecurity (07/26/2023)   Hunger Vital Sign    Worried About Running Out of Food in the Last Year: Never true    Ran Out of Food in the Last Year: Never true  Transportation Needs: No Transportation Needs (07/26/2023)   PRAPARE - Administrator, Civil Service (Medical): No    Lack  of Transportation (Non-Medical): No  Physical Activity: Sufficiently Active (07/26/2023)   Exercise Vital Sign    Days of Exercise per Week: 6 days     Minutes of Exercise per Session: 60 min  Stress: No Stress Concern Present (07/26/2023)   [employer] of Occupational Health - Occupational Stress Questionnaire    Feeling of Stress : Not at all  Social Connections: Socially Isolated (07/26/2023)   Social Connection and Isolation Panel [NHANES]    Frequency of Communication with Friends and Family: More than three times a week    Frequency of Social Gatherings with Friends and Family: Three times a week    Attends Religious Services: Never    Active Member of Clubs or Organizations: No    Attends Banker Meetings: Never    Marital Status: Divorced    Tobacco Counseling Ready to quit: Not Answered Counseling given: Not Answered Tobacco comments: quit in the past for 9 years.  Previous 1ppd    Clinical Intake:  Pre-visit preparation completed: Yes  Pain : 0-10 Pain Score: 2  Pain Type: Chronic pain Pain Location: Generalized     BMI - recorded: 22.63 Nutritional Status: BMI of 19-24  Normal Nutritional Risks: None Diabetes: No  Lab Results  Component Value Date   HGBA1C 5.4 12/25/2022   HGBA1C 5.9 09/22/2019   HGBA1C 6.4 (A) 03/18/2019     How often do you need to have someone help you when you read instructions, pamphlets, or other written materials from your doctor or pharmacy?: 1 - Never  Interpreter Needed?: No  Information entered by :: Druscilla Gerhard, LPN.   Activities of Daily Living     07/26/2023    4:35 PM 07/31/2022    4:14 PM  In your present state of health, do you have any difficulty performing the following activities:  Hearing? 0 0  Vision? 0 0  Difficulty concentrating or making decisions? 0 0  Walking or climbing stairs? 0 0  Dressing or bathing? 0 0  Doing errands, shopping? 0 0  Preparing Food and eating ? N N  Using the Toilet? N N  In the past six months, have you accidently leaked urine? N N  Do you have problems with loss of bowel control? N N  Managing your  Medications? N N  Managing your Finances? N N  Housekeeping or managing your Housekeeping? N N    Patient Care Team: Farris Hong, MD as PCP - General (Family Medicine) Neil Balls, MD as Consulting Physician (Orthopedic Surgery) Pecen, Paula E, MD as Consulting Physician  I have updated your Care Teams any recent Medical Services you may have received from other providers in the past year.     Assessment:    This is a routine wellness examination for Alexis Cole.  Hearing/Vision screen Hearing Screening - Comments:: Patient has hearing aids.  Vision Screening - Comments:: Wears reading glasses - up to date with routine eye exams with Dr. Lendia Quay     Goals Addressed             This Visit's Progress    07/26/2023: To stay healthy, independent and active.         Depression Screen     07/26/2023    4:37 PM 04/26/2023   11:37 AM 07/31/2022    4:13 PM 11/10/2021    4:23 PM 04/19/2021    9:18 AM 09/30/2020    1:34 PM 07/26/2020    8:33 AM  PHQ 2/9 Scores  PHQ - 2 Score 1  0 0 2 2 0  PHQ- 9 Score 2   6 8 8 7   Exception Documentation  Patient refusal         Fall Risk     07/26/2023    4:31 PM 04/26/2023   11:38 AM 03/06/2023    2:13 PM 07/31/2022    4:14 PM 05/25/2021    2:32 PM  Fall Risk   Falls in the past year? 0 0 0 0 0  Number falls in past yr: 0 0 0 0 0  Injury with Fall? 0 0 0 0 0  Risk for fall due to : No Fall Risks No Fall Risks  No Fall Risks   Follow up Falls evaluation completed   Falls prevention discussed;Education provided;Falls evaluation completed     MEDICARE RISK AT HOME:  Medicare Risk at Home Any stairs in or around the home?: No If so, are there any without handrails?: No Home free of loose throw rugs in walkways, pet beds, electrical cords, etc?: Yes Adequate lighting in your home to reduce risk of falls?: Yes Life alert?: No Use of a cane, walker or w/c?: No Grab bars in the bathroom?: Yes Shower chair or bench in shower?: No Elevated  toilet seat or a handicapped toilet?: No  TIMED UP AND GO:  Was the test performed?  No  Cognitive Function: Declined/Normal: No cognitive concerns noted by patient or family. Patient alert, oriented, able to answer questions appropriately and recall recent events. No signs of memory loss or confusion.    07/26/2023    4:36 PM  MMSE - Mini Mental State Exam  Not completed: Unable to complete        07/26/2023    4:32 PM 07/31/2022    4:15 PM 04/19/2021    9:23 AM 09/09/2018    9:09 AM  6CIT Screen  What Year? 0 points 0 points 0 points 0 points  What month? 0 points 0 points 0 points 0 points  What time? 0 points 0 points 0 points 0 points  Count back from 20 0 points 0 points 0 points 0 points  Months in reverse 0 points 0 points 0 points 0 points  Repeat phrase 0 points 0 points 0 points 0 points  Total Score 0 points 0 points 0 points 0 points    Immunizations Immunization History  Administered Date(s) Administered   Fluad Quad(high Dose 65+) 12/28/2020, 11/10/2021   Influenza,inj,Quad PF,6+ Mos 01/03/2013, 04/17/2014, 11/17/2015, 10/25/2016, 10/25/2017, 11/05/2018, 11/26/2019   Influenza-Unspecified 10/19/2022   PFIZER Comirnaty(Gray Top)Covid-19 Tri-Sucrose Vaccine 05/28/2020   PFIZER(Purple Top)SARS-COV-2 Vaccination 04/15/2019, 05/06/2019, 01/11/2020   Pfizer Covid-19 Vaccine Bivalent Booster 24yrs & up 11/12/2020, 09/10/2021   Pfizer(Comirnaty)Fall Seasonal Vaccine 12 years and older 10/19/2022   Pneumococcal Conjugate-13 04/17/2014   Pneumococcal Polysaccharide-23 09/04/2011   Tdap 08/13/2014   Zoster Recombinant(Shingrix) 10/07/2019, 11/21/2019    Screening Tests Health Maintenance  Topic Date Due   COVID-19 Vaccine (9 - 2024-25 season) 04/20/2023   INFLUENZA VACCINE  09/21/2023   Lung Cancer Screening  03/29/2024   Medicare Annual Wellness (AWV)  07/25/2024   DTaP/Tdap/Td (2 - Td or Tdap) 08/12/2024   Pneumonia Vaccine 109+ Years old  Completed   DEXA SCAN   Completed   Hepatitis C Screening  Completed   Zoster Vaccines- Shingrix  Completed   HPV VACCINES  Aged Out   Meningococcal B Vaccine  Aged Out    Health Maintenance  Health Maintenance Due  Topic Date Due   COVID-19 Vaccine (9 - 2024-25 season) 04/20/2023   Health Maintenance Items Addressed: Yes Patient aware of current care gaps.  Additional Screening:  Vision Screening: Recommended annual ophthalmology exams for early detection of glaucoma and other disorders of the eye. Would you like a referral to an eye doctor? No    Dental Screening: Recommended annual dental exams for proper oral hygiene  Community Resource Referral / Chronic Care Management: CRR required this visit?  No   CCM required this visit?  No   Plan:    I have personally reviewed and noted the following in the patient's chart:   Medical and social history Use of alcohol, tobacco or illicit drugs  Current medications and supplements including opioid prescriptions. Patient is not currently taking opioid prescriptions. Functional ability and status Nutritional status Physical activity Advanced directives List of other physicians Hospitalizations, surgeries, and ER visits in previous 12 months Vitals Screenings to include cognitive, depression, and falls Referrals and appointments  In addition, I have reviewed and discussed with patient certain preventive protocols, quality metrics, and best practice recommendations. A written personalized care plan for preventive services as well as general preventive health recommendations were provided to patient.   Margette Sheldon, LPN   4/0/9811   After Visit Summary: (MyChart) Due to this being a telephonic visit, the after visit summary with patients personalized plan was offered to patient via MyChart   Notes: Nothing at this time.

## 2023-07-26 NOTE — Patient Instructions (Signed)
 Alexis Cole , Thank you for taking time out of your busy schedule to complete your Annual Wellness Visit with me. I enjoyed our conversation and look forward to speaking with you again next year. I, as well as your care team,  appreciate your ongoing commitment to your health goals. Please review the following plan we discussed and let me know if I can assist you in the future. Your Game plan/ To Do List    Referrals: If you haven't heard from the office you've been referred to, please reach out to them at the phone provided.   Follow up Visits: Next Medicare AWV with our clinical staff: 07/31/2024 at 4:10 pm Phone Visit with Nurse Health Advisor   Have you seen your provider in the last 6 months (3 months if uncontrolled diabetes)? Yes Next Office Visit with your provider: Ofice w ill call patient to schedule next available with PCP  Clinician Recommendations:  Aim for 30 minutes of exercise or brisk walking, 6-8 glasses of water , and 5 servings of fruits and vegetables each day.       This is a list of the screening recommended for you and due dates:  Health Maintenance  Topic Date Due   COVID-19 Vaccine (9 - 2024-25 season) 04/20/2023   Flu Shot  09/21/2023   Screening for Lung Cancer  03/29/2024   Medicare Annual Wellness Visit  07/25/2024   DTaP/Tdap/Td vaccine (2 - Td or Tdap) 08/12/2024   Pneumonia Vaccine  Completed   DEXA scan (bone density measurement)  Completed   Hepatitis C Screening  Completed   Zoster (Shingles) Vaccine  Completed   HPV Vaccine  Aged Out   Meningitis B Vaccine  Aged Out    Advanced directives: (Provided) Advance directive discussed with you today. I have provided a copy for you to complete at home and have notarized. Once this is complete, please bring a copy in to our office so we can scan it into your chart.  Advance Care Planning is important because it:  [x]  Makes sure you receive the medical care that is consistent with your values, goals, and  preferences  [x]  It provides guidance to your family and loved ones and reduces their decisional burden about whether or not they are making the right decisions based on your wishes.  Follow the link provided in your after visit summary or read over the paperwork we have mailed to you to help you started getting your Advance Directives in place. If you need assistance in completing these, please reach out to us  so that we can help you!  See attachments for Preventive Care and Fall Prevention Tips.

## 2023-08-03 ENCOUNTER — Ambulatory Visit (INDEPENDENT_AMBULATORY_CARE_PROVIDER_SITE_OTHER): Admitting: Family Medicine

## 2023-08-03 ENCOUNTER — Encounter: Payer: Self-pay | Admitting: Family Medicine

## 2023-08-03 VITALS — BP 125/68 | HR 61 | Ht 65.0 in | Wt 141.4 lb

## 2023-08-03 DIAGNOSIS — F17209 Nicotine dependence, unspecified, with unspecified nicotine-induced disorders: Secondary | ICD-10-CM

## 2023-08-03 DIAGNOSIS — F17200 Nicotine dependence, unspecified, uncomplicated: Secondary | ICD-10-CM

## 2023-08-03 DIAGNOSIS — I10 Essential (primary) hypertension: Secondary | ICD-10-CM | POA: Diagnosis not present

## 2023-08-03 NOTE — Progress Notes (Signed)
    SUBJECTIVE:   CHIEF COMPLAINT / HPI:   Patient here for follow up of chronic conditions. Reports no changes in medications or any concerns since last follow up. Has been having some back pain and is trying to get scheduled for her injections that she has been getting for years. Reports some sciatic pain with a shooting pain down her leg, but no loss of bowel or bladder or any new changes to the quality of pain. Otherwise is coping better with the passing of her brother. Is having some stress due to her upstairs neighbor not taking good care of his dog.   Discussed tobacco use. Is trying to cut back but the neighbor situation is stressing her out. Currently smoking 10 cigarettes daily   PERTINENT  PMH / PSH:  Tobacco Use disorder HTN Thyroid  nodules Osteoarthritis in bilateral hips and knees   OBJECTIVE:   BP 125/68   Pulse 61   Ht 5' 5 (1.651 m)   Wt 141 lb 6.4 oz (64.1 kg)   SpO2 100%   BMI 23.53 kg/m   General: A&O, NAD HEENT: No sign of trauma, EOM grossly intact Cardiac: RRR, no m/r/g Respiratory: CTAB, normal WOB, no w/c/r GI: Soft, NTTP, non-distended  Extremities: NTTP, no peripheral edema. Neuro: Normal gait, moves all four extremities appropriately. Psych: Appropriate mood and affect   ASSESSMENT/PLAN:   Assessment & Plan Tobacco use disorder Counseled on cessation provided. Patient is aware of options, and is not interested in medication at this time. Will continue to work on cutting down on her own.  Essential hypertension, benign Well managed on current regimen. - continue amlodipine  10 mg daily and lisinopril  20 mg daily      Johnella Naas, MD [facility]

## 2023-08-03 NOTE — Assessment & Plan Note (Signed)
 Well managed on current regimen. - continue amlodipine  10 mg daily and lisinopril  20 mg daily

## 2023-08-03 NOTE — Patient Instructions (Addendum)
 It was wonderful to see you today.  Please bring ALL of your medications with you to every visit.   Today we talked about:  Your blood pressure looks great, lets keep your regimen as is.  Thank you for choosing [facility].   Please call [phone] with any questions about today's appointment.  Please arrive at least 15 minutes prior to your scheduled appointments.   If you had blood work today, I will send you a MyChart message or a letter if results are normal. Otherwise, I will give you a call.   If you had a referral placed, they will call you to set up an appointment. Please give us  a call if you don't hear back in the next 2 weeks.   If you need additional refills before your next appointment, please call your pharmacy first.   You should follow up in our clinic in [place] in about 6 months (around 02/02/2024).  Johnella Naas, MD Family Medicine

## 2023-08-03 NOTE — Assessment & Plan Note (Signed)
 Counseled on cessation provided. Patient is aware of options, and is not interested in medication at this time. Will continue to work on cutting down on her own.

## 2023-08-21 ENCOUNTER — Other Ambulatory Visit: Payer: Self-pay | Admitting: Family Medicine

## 2023-08-21 DIAGNOSIS — G4700 Insomnia, unspecified: Secondary | ICD-10-CM

## 2023-09-03 ENCOUNTER — Ambulatory Visit (INDEPENDENT_AMBULATORY_CARE_PROVIDER_SITE_OTHER): Admitting: "Endocrinology

## 2023-09-03 ENCOUNTER — Encounter: Payer: Self-pay | Admitting: "Endocrinology

## 2023-09-03 VITALS — BP 122/70 | HR 56 | Ht 65.0 in | Wt 144.0 lb

## 2023-09-03 DIAGNOSIS — E042 Nontoxic multinodular goiter: Secondary | ICD-10-CM | POA: Diagnosis not present

## 2023-09-03 NOTE — Progress Notes (Signed)
 Outpatient Endocrinology Note Obadiah Birmingham, MD  09/03/23   Alexis Cole 04/26/1945 [id number]  Referring Provider: Lonnie Earnest, MD Primary Care Provider: Lonnie Earnest, MD Subjective  No chief complaint on file.   Assessment & Plan  Diagnoses and all orders for this visit:  Multinodular goiter -     Cancel: US  FNA BX THYROID  1ST LESION AFIRMA; Future -     Cancel: US  FNA BX THYROID  1ST LESION AFIRMA -     US  FNA BX THYROID  1ST LESION AFIRMA; Future    Alexis Cole is currently taking not taking any thyroid  medication. Patient is currently biochemically euthyroid.  Educated on thyroid  axis.  Never been on any medication, no indication for it   Has history of multinodular goiter  S/p FNA in 11/13/2019: THYROID ,RIGHT INFERIOR: Scant follicular epithelium present (Bethesda category I) re-biopsied on 12/09/2019 Right inferior 2.7cm; Other 2 dimensions: 2.3 x 1.7cm:  Consistent with benign follicular nodule (Bethesda category II)  12/2022 repeat thyroid  U/S reported thyromegaly with multiple nodules  04/2023 Ordered FNA for Right; Mid 1.8 cm x 1.1 x 0.9 cm, previously, 1.8 x 1.6 x 1.0 cm 04/2023 Ordered FNA for 2021 FNA -ve THYROID ,RIGHT INFERIOR given >50% increase in volume (2025 at 3.7 cm x 2.6 cm x 4.4cm ), previously at  2.7cm x 2.3 x 1.7cm 05/23/23: Similar appearance of previously biopsied right inferior solid thyroid  nodule. Labeled 4, 4.4 x 3.7 x 2.6 cm: Benign follicular nodule (Bethesda category II)  05/23/23: Nodule #3: TR4 Right; Mid, Maximum size: 1.8 cm x 1.1 x 0.9 cm, previously, 1.8 x 1.6 x 1.0 cm: - Scant follicular epithelium present (Bethesda category I) -ordered repeat on 09/03/23  Patient understands and agreed   I have reviewed current medications, nurse's notes, allergies, vital signs, past medical and surgical history, family medical history, and social history for this encounter. Counseled patient on symptoms, examination findings, lab findings,  imaging results, treatment decisions and monitoring and prognosis. The patient understood the recommendations and agrees with the treatment plan. All questions regarding treatment plan were fully answered.   Return in about 6 months (around 03/05/2024).   Obadiah Birmingham, MD  09/03/23   I have reviewed current medications, nurse's notes, allergies, vital signs, past medical and surgical history, family medical history, and social history for this encounter. Counseled patient on symptoms, examination findings, lab findings, imaging results, treatment decisions and monitoring and prognosis. The patient understood the recommendations and agrees with the treatment plan. All questions regarding treatment plan were fully answered.   History of Present Illness Alexis Cole is a 78 y.o. year old female who presents to our clinic with multinodular goiter diagnosed in 10/2019.    Never been on any thyroid  medication  No active complaints   Compressive symptoms:  dysphagia  No dysphonia  No positional dyspnea (especially with simultaneous arms elevation)  No  Smokes  Yes On biotin  Yes Personal history of head/neck surgery/irradiation  No   Physical Exam  BP 122/70   Pulse (!) 56   Ht 5' 5 (1.651 m)   Wt 144 lb (65.3 kg)   SpO2 97%   BMI 23.96 kg/m  Constitutional: well developed, well nourished Head: normocephalic, atraumatic, no exophthalmos Eyes: sclera anicteric, no redness Neck: + thyromegaly, - thyroid  tenderness; + nodule palpated Lungs: normal respiratory effort Neurology: alert and oriented, no fine hand tremor Skin: dry, no appreciable rashes Musculoskeletal: no appreciable defects Psychiatric: normal mood and affect  Allergies Allergies  Allergen Reactions   Tape Rash    Current Medications Patient's Medications  New Prescriptions   No medications on file  Previous Medications   AMLODIPINE  (NORVASC ) 10 MG TABLET    Take 1 tablet (10 mg total) by mouth at  bedtime.   ASPIRIN  EC 325 MG TABLET    Take 1 tablet (325 mg total) by mouth 2 (two) times daily after a meal. Take x 1 month post op to decrease risk of blood clots.   ATORVASTATIN  (LIPITOR) 40 MG TABLET    Take 1 tablet (40 mg total) by mouth daily.   BIOTIN PO    Take 500 mcg by mouth at bedtime.    BUSPIRONE  (BUSPAR ) 10 MG TABLET    Take 1 tablet (10 mg total) by mouth 2 (two) times daily.   CHOLECALCIFEROL (VITAMIN D3) 50 MCG (2000 UT) CAPSULE    Take 2,000 Units by mouth at bedtime.    COENZYME Q10 (CO Q-10) 300 MG CAPS    Take 300 mg by mouth at bedtime.   FLUTICASONE  (FLONASE ) 50 MCG/ACT NASAL SPRAY    PLACE 1 SPRAY INTO BOTH NOSTRILS DAILY AS NEEDED FOR ALLERGIES OR RHINITIS.   FOLIC ACID (FOLVITE) 400 MCG TABLET    Take 400 mcg by mouth at bedtime.    GABAPENTIN  (NEURONTIN ) 300 MG CAPSULE    TAKE 1 CAPSULE BY MOUTH THREE TIMES A DAY   KRILL OIL 350 MG CAPS    Take 350 mg by mouth at bedtime.    LISINOPRIL  (ZESTRIL ) 20 MG TABLET    Take 1 tablet (20 mg total) by mouth daily.   MULTIPLE VITAMINS-MINERALS (HEALTHY EYES PO)    Take 1 tablet by mouth at bedtime.  Modified Medications   No medications on file  Discontinued Medications   No medications on file    Past Medical History Past Medical History:  Diagnosis Date   Acne    but not being treated for   Acute recurrent sinusitis 08/13/2014   Anemia    history of--many yrs ago   Anxiety    but doesn't take any meds for this   Aortic atherosclerosis (HCC) 06/08/2016   noted on CT abd/pelvis   Arthritis    Knees, hips back, shoulder   Arthropathy, lower leg 02/10/2009   For eye care pt is going to Dr. Octavia, and her Ortho surgeon is Dr. Norleen Gavel and [facility].     Back pain 11/17/2015   Breast mass in female 08/20/2014   IMPRESSION: Further evaluation is suggested for possible mass in the left breast.The patient will be contacted regarding the findings, and additional imaging will be scheduled.  BI-RADS CATEGORY  0:  Incomplete. Need additional imaging evaluation and/or prior mammograms for comparison.    Chronic back pain    Complication of anesthesia    States she woke up just after parathyroid  surgery while still in OR and attempted to get off the bed, remebers seeing staffs back towards her   Depression    Diarrhea    Diverticulitis 06/08/2016   noted on CT abd/pelvis   Diverticulosis 06/08/2016   noted on CT abd/pelvis   Dyslipidemia    Dyspnea 10/26/2017   Estrogen deficiency 08/13/2014   Fatigue 02/24/2016   Fatty liver 06/08/2016   severe noted on CT abd/pelvis    Gastric ulcer    history of--many yrs ago   General weakness 06/08/2016   Headache(784.0)    History of migraine   Heart murmur  History of bronchitis    last time 6months   History of syncope    Hx of migraines    last one a month ago   Hypercalcemia 12/12/2016   Hyperparathyroidism (HCC) 12/12/2016   Hypertension     Dr     Jarrett   Increased endometrial stripe thickness 06/13/2016   Insomnia    Nyquil nightly   Joint pain    Joint swelling    Lactose intolerance    Nausea and vomiting    Obesity    Pneumonia 03/15/2016   left lower lobe, left lower lobe atelectasis, elevated left hemidiaphragm noted again, small left pleural effusion   Pre-syncope 04/12/2016   PTSD (post-traumatic stress disorder)    Saddle anesthesia 06/28/2018   Sinusitis    Urge incontinence of urine     Past Surgical History Past Surgical History:  Procedure Laterality Date   CATARACT EXTRACTION, BILATERAL     CERVICAL CONE BIOPSY     DILATION AND CURETTAGE OF UTERUS  30+yrs ago   MOUTH SURGERY     NASAL SEPTUM SURGERY  40+yrs ago   PARATHYROIDECTOMY Left 01/04/2017   Procedure: LEFT SUPERIOR PARATHYROIDECTOMY;  Surgeon: Eletha Boas, MD;  Location: [facility];  Service: General;  Laterality: Left;   TOTAL HIP ARTHROPLASTY  09/01/2011   Procedure: TOTAL HIP ARTHROPLASTY;  Surgeon: Norleen LITTIE Gavel, MD;  Location: [facility];  Service:  Orthopedics;  Laterality: Right;   TOTAL HIP ARTHROPLASTY Left 07/26/2018   Procedure: TOTAL HIP ARTHROPLASTY ANTERIOR APPROACH;  Surgeon: Gavel Norleen, MD;  Location: [facility];  Service: Orthopedics;  Laterality: Left;   TOTAL KNEE ARTHROPLASTY Left 03/09/2017   Procedure: LEFT TOTAL KNEE ARTHROPLASTY;  Surgeon: Gavel Norleen, MD;  Location: [facility];  Service: Orthopedics;  Laterality: Left;   TOTAL KNEE ARTHROPLASTY Right 07/18/2019   Procedure: TOTAL KNEE ARTHROPLASTY;  Surgeon: Gavel Norleen, MD;  Location: [facility];  Service: Orthopedics;  Laterality: Right;   TUBAL LIGATION  30+yrs ago    Family History family history includes Arthritis-Osteo in her mother; Cancer - Prostate in her father; Diabetes Mellitus II in her maternal grandmother; Heart attack in her maternal grandfather; High blood pressure in her brother and son; Hypertension in her mother; Irritable bowel syndrome in her mother.  Social History Social History   Socioeconomic History   Marital status: Divorced    Spouse name: Not on file   Number of children: 1   Years of education: 12   Highest education level: High school graduate  Occupational History   Occupation: Producer, television/film/video     Comment: retired    Occupation: Paediatric nurse     Comment: retired   Tobacco Use   Smoking status: Every Day    Current packs/day: 0.50    Average packs/day: 0.5 packs/day for 55.5 years (27.8 ttl pk-yrs)    Types: Cigarettes    Start date: 02/21/1968    Passive exposure: Current   Smokeless tobacco: Never   Tobacco comments:    quit in the past for 9 years.  Previous 1ppd  Vaping Use   Vaping status: Never Used  Substance and Sexual Activity   Alcohol use: No    Alcohol/week: 0.0 standard drinks of alcohol   Drug use: No   Sexual activity: Not Currently    Birth control/protection: Post-menopausal  Other Topics Concern   Not on file  Social History Narrative   Patient lives alone in [place].    The patient does have a dog named  Cardio who brings her great joy.   Patients only child, a son, lives out in [place]. They talk on the phone several times a week.    The patient is Buddhist and practices daily meditation.    The patient enjoys walking her dog, zumba classes and swimming.        Social Drivers of Corporate investment banker Strain: Low Risk  (07/26/2023)   Overall Financial Resource Strain (CARDIA)    Difficulty of Paying Living Expenses: Not hard at all  Food Insecurity: No Food Insecurity (07/26/2023)   Hunger Vital Sign    Worried About Running Out of Food in the Last Year: Never true    Ran Out of Food in the Last Year: Never true  Transportation Needs: No Transportation Needs (07/26/2023)   PRAPARE - Administrator, Civil Service (Medical): No    Lack of Transportation (Non-Medical): No  Physical Activity: Sufficiently Active (07/26/2023)   Exercise Vital Sign    Days of Exercise per Week: 6 days    Minutes of Exercise per Session: 60 min  Stress: No Stress Concern Present (07/26/2023)   [employer] of Occupational Health - Occupational Stress Questionnaire    Feeling of Stress : Not at all  Social Connections: Socially Isolated (07/26/2023)   Social Connection and Isolation Panel    Frequency of Communication with Friends and Family: More than three times a week    Frequency of Social Gatherings with Friends and Family: Three times a week    Attends Religious Services: Never    Active Member of Clubs or Organizations: No    Attends Banker Meetings: Never    Marital Status: Divorced  Catering manager Violence: Not At Risk (07/26/2023)   Humiliation, Afraid, Rape, and Kick questionnaire    Fear of Current or Ex-Partner: No    Emotionally Abused: No    Physically Abused: No    Sexually Abused: No    Laboratory Investigations Lab Results  Component Value Date   TSH 0.51 05/02/2023   TSH 0.572 12/25/2022   TSH 1.140 05/25/2021   FREET4 1.0 05/02/2023   FREET4 1.17  11/26/2019     No results found for: TSI   No components found for: TRAB   Lab Results  Component Value Date   CHOL 146 12/25/2022   Lab Results  Component Value Date   HDL 83 12/25/2022   Lab Results  Component Value Date   LDLCALC 53 12/25/2022   Lab Results  Component Value Date   TRIG 43 12/25/2022   Lab Results  Component Value Date   CHOLHDL 1.8 12/25/2022   Lab Results  Component Value Date   CREATININE 0.84 12/25/2022   No results found for: GFR    Component Value Date/Time   NA 137 12/25/2022 1152   K 4.5 12/25/2022 1152   CL 101 12/25/2022 1152   CO2 23 12/25/2022 1152   GLUCOSE 94 12/25/2022 1152   GLUCOSE 134 (H) 07/10/2019 1041   BUN 11 12/25/2022 1152   CREATININE 0.84 12/25/2022 1152   CREATININE 0.88 04/17/2016 1001   CALCIUM  10.9 (H) 03/06/2023 1517   PROT 6.7 12/25/2022 1152   ALBUMIN 4.8 12/25/2022 1152   AST 24 12/25/2022 1152   ALT 14 12/25/2022 1152   ALKPHOS 92 12/25/2022 1152   BILITOT 0.5 12/25/2022 1152   GFRNONAA 56 (L) 07/10/2019 1041   GFRNONAA 58 (L) 04/13/2016 1559   GFRAA >60 07/10/2019 1041   GFRAA  67 04/13/2016 1559      Latest Ref Rng & Units 03/06/2023    3:17 PM 12/25/2022   11:52 AM 05/24/2020    9:35 AM  BMP  Glucose 70 - 99 mg/dL  94  889   BUN 8 - 27 mg/dL  11  17   Creatinine 9.42 - 1.00 mg/dL  9.15  9.16   BUN/Creat Ratio 12 - 28  13  20    Sodium 134 - 144 mmol/L  137  139   Potassium 3.5 - 5.2 mmol/L  4.5  4.3   Chloride 96 - 106 mmol/L  101  102   CO2 20 - 29 mmol/L  23  20   Calcium  8.7 - 10.3 mg/dL 89.0  89.5  89.9        Component Value Date/Time   WBC 7.4 12/25/2022 1152   WBC 12.8 (H) 07/19/2019 0314   RBC 4.23 12/25/2022 1152   RBC 3.89 07/19/2019 0314   HGB 13.5 12/25/2022 1152   HCT 39.9 12/25/2022 1152   PLT 174 12/25/2022 1152   MCV 94 12/25/2022 1152   MCH 31.9 12/25/2022 1152   MCH 30.8 07/19/2019 0314   MCHC 33.8 12/25/2022 1152   MCHC 31.8 07/19/2019 0314   RDW 11.9  12/25/2022 1152   LYMPHSABS 3.1 07/10/2019 1041   LYMPHSABS 4.3 (H) 02/15/2018 1529   MONOABS 0.7 07/10/2019 1041   EOSABS 0.1 07/10/2019 1041   EOSABS 0.3 02/15/2018 1529   BASOSABS 0.1 07/10/2019 1041   BASOSABS 0.1 02/15/2018 1529      Parts of this note may have been dictated using voice recognition software. There may be variances in spelling and vocabulary which are unintentional. Not all errors are proofread. Please notify the dino if any discrepancies are noted or if the meaning of any statement is not clear.

## 2023-09-14 ENCOUNTER — Other Ambulatory Visit: Payer: Self-pay | Admitting: "Endocrinology

## 2023-09-14 ENCOUNTER — Ambulatory Visit
Admission: RE | Admit: 2023-09-14 | Discharge: 2023-09-14 | Disposition: A | Discharge status: Home / Self Care | Source: Ambulatory Visit | Attending: "Endocrinology | Admitting: "Endocrinology

## 2023-09-14 DIAGNOSIS — E042 Nontoxic multinodular goiter: Secondary | ICD-10-CM | POA: Diagnosis not present

## 2023-09-19 DIAGNOSIS — H353231 Exudative age-related macular degeneration, bilateral, with active choroidal neovascularization: Secondary | ICD-10-CM | POA: Diagnosis not present

## 2023-10-19 DIAGNOSIS — M4316 Spondylolisthesis, lumbar region: Secondary | ICD-10-CM | POA: Diagnosis not present

## 2023-10-20 ENCOUNTER — Other Ambulatory Visit: Payer: Self-pay | Admitting: Family Medicine

## 2023-10-20 DIAGNOSIS — I10 Essential (primary) hypertension: Secondary | ICD-10-CM

## 2023-10-24 NOTE — Telephone Encounter (Signed)
 Chart reviewed. Rx refilled.

## 2023-11-13 DIAGNOSIS — M5116 Intervertebral disc disorders with radiculopathy, lumbar region: Secondary | ICD-10-CM | POA: Diagnosis not present

## 2023-11-13 DIAGNOSIS — M5416 Radiculopathy, lumbar region: Secondary | ICD-10-CM | POA: Diagnosis not present

## 2023-11-28 DIAGNOSIS — H353231 Exudative age-related macular degeneration, bilateral, with active choroidal neovascularization: Secondary | ICD-10-CM | POA: Diagnosis not present

## 2023-12-25 ENCOUNTER — Ambulatory Visit: Admitting: Family Medicine

## 2023-12-25 ENCOUNTER — Encounter: Payer: Self-pay | Admitting: Family Medicine

## 2023-12-25 VITALS — BP 130/62 | HR 67 | Ht 65.0 in | Wt 142.0 lb

## 2023-12-25 DIAGNOSIS — H00015 Hordeolum externum left lower eyelid: Secondary | ICD-10-CM | POA: Diagnosis not present

## 2023-12-25 DIAGNOSIS — F341 Dysthymic disorder: Secondary | ICD-10-CM | POA: Diagnosis not present

## 2023-12-25 DIAGNOSIS — Z23 Encounter for immunization: Secondary | ICD-10-CM | POA: Diagnosis not present

## 2023-12-25 MED ORDER — ESCITALOPRAM OXALATE 5 MG PO TABS: 5.0000 mg | ORAL_TABLET | Freq: Every day | ORAL | 0 refills | Status: DC

## 2023-12-25 NOTE — Progress Notes (Signed)
    SUBJECTIVE:   CHIEF COMPLAINT / HPI:   Noticed hard lump under eyelid. Applys kohl to eyes. No changes in vision other than her underlying macular degeneration. Has tried cold compress, but nothing else. No drainage.  Anxiety: feels her anxiety is getting worse. Is having a lot of life stressors she feels are contributing including finding a new apartment. She feels like there is a lot on her plate and has a hard time handling it. Has appointments for all of her health conditions and also has to figure out how to afford food with threat of SNAP benefits ending. Is taking buspar  10mg  twice daily. This helps some with the anxiety but she is still anxious. Still smoking, she knows she needs to quit. Is not ready at this time.      12/25/2023    9:17 AM 06/13/2019   11:40 AM  GAD 7 : Generalized Anxiety Score  Nervous, Anxious, on Edge 3 2  Control/stop worrying 3 1  Worry too much - different things 3 1  Trouble relaxing 3 0  Restless 1 0  Easily annoyed or irritable 2 0  Afraid - awful might happen 3 0  Total GAD 7 Score 18 4  Anxiety Difficulty Somewhat difficult       Flowsheet Row Office Visit from 12/25/2023 in [facility] [place]  PHQ-9 Total Score 9    OBJECTIVE:   BP 130/62   Pulse 67   Ht 5' 5 (1.651 m)   Wt 142 lb (64.4 kg)   SpO2 96%   BMI 23.63 kg/m   General: A&O, NAD HEENT: No sign of trauma, EOM grossly intact. Small round stye on left eyelid, no drainage or surrounding erythema  Cardiac: RRR, no m/r/g Respiratory: CTAB, normal WOB, no w/c/r GI: Soft, NTTP, non-distended  Extremities: NTTP, no peripheral edema. Neuro: Normal gait, moves all four extremities appropriately. Psych: Appropriate mood and affect   ASSESSMENT/PLAN:   Assessment & Plan ANXIETY DEPRESSION Discussed symptoms with patient. Given recent thyroid  studies wnl, do not feel it is necessary to repeat this. Anxiety likely 2/2 to life  stressors  - continue buspar  10mg  daily - trial lexaprol 5mg , increase to 10 mg - referral to [facility]  - discussed therapy, feels its too much at this time, will revisit at follow up  Hordeolum externum of left lower eyelid - warm compress - return if worsening      Gloriann Ogren, MD [facility]

## 2023-12-25 NOTE — Patient Instructions (Addendum)
 It was wonderful to see you today.  Please bring ALL of your medications with you to every visit.   Today we talked about:  Anxiety: lets continue the buspar  and I added lexapro  to your medications. Start with one pill a day for one week and then go up to two pills a day. I also sent a referral to [facility] care management team.  Thank you for choosing [facility].   Please call [phone] with any questions about today's appointment.  Please arrive at least 15 minutes prior to your scheduled appointments.   If you had blood work today, I will send you a MyChart message or a letter if results are normal. Otherwise, I will give you a call.   If you had a referral placed, they will call you to set up an appointment. Please give us  a call if you don't hear back in the next 2 weeks.   If you need additional refills before your next appointment, please call your pharmacy first.   Do you need your medications delivered to your home?   We'll send your prescription to the [facility] for delivery.          Address: [address] [place], [place], [place] [address]          Phone: [phone]  Please call the [facility] to speak with a pharmacist and set up your home medication delivery. If you have any questions, feel free to contact us  -- we're happy to help!  Other [facility] that offer affordable prices on both prescriptions and over-the-counter items, as well as convenient services like vaccinations, are  [facility], at [facility]         Address:  [address], [place], [place] [address]         Phone: [phone]  [facility], located in the [facility]        Address: [address] [place], [place] [address]        Phone: [phone]  [facility], at [facility]       Address: [address], [place], [place] [address]        Phone: [phone]  [facility], at [facility]       Address: [address] Floor, [place], [place] [address]       Phone: [phone]  You should follow up in our clinic in No follow-ups on file.  Gloriann Ogren, MD Family Medicine

## 2023-12-25 NOTE — Assessment & Plan Note (Signed)
 Discussed symptoms with patient. Given recent thyroid  studies wnl, do not feel it is necessary to repeat this. Anxiety likely 2/2 to life stressors  - continue buspar  10mg  daily - trial lexaprol 5mg , increase to 10 mg - referral to [facility]  - discussed therapy, feels its too much at this time, will revisit at follow up

## 2023-12-28 ENCOUNTER — Other Ambulatory Visit: Payer: Self-pay | Admitting: Family Medicine

## 2023-12-28 DIAGNOSIS — G4700 Insomnia, unspecified: Secondary | ICD-10-CM

## 2024-01-02 ENCOUNTER — Telehealth: Payer: Self-pay

## 2024-01-02 NOTE — Progress Notes (Signed)
 Complex Care Management Note  Care Guide Note 01/02/2024 Name: Alexis Cole MRN: [id number] DOB: 26-Feb-1945  [name] is a 78 y.o. year old female who sees Baloch, Mahnoor, MD for primary care. I reached out to Orie LELON [place] by phone today to offer complex care management services.  [name] was given information about Complex Care Management services today including:   The Complex Care Management services include support from the care team which includes your Nurse Care Manager, Clinical Social Worker, or Pharmacist.  The Complex Care Management team is here to help remove barriers to the health concerns and goals most important to you. Complex Care Management services are voluntary, and the patient may decline or stop services at any time by request to their care team member.   Complex Care Management Consent Status: Patient agreed to services and verbal consent obtained.   Follow up plan:  01/09/24 and 01/25/24  Encounter Outcome:  Patient Scheduled  Leotis Rase [facility], [facility] Guide  Direct Dial: [phone]  Fax [phone]

## 2024-01-09 ENCOUNTER — Other Ambulatory Visit: Payer: Self-pay

## 2024-01-09 NOTE — Patient Outreach (Signed)
 Social Drivers of Health  Community Resource and Care Coordination Visit Note   01/09/2024  Name: BRIANNA BENNETT MRN: [id number] DOB:19-Sep-1945  Situation: Referral received for [facility] needs assessment and assistance related to None. I obtained verbal consent from Patient.  Visit completed with Patient on the phone.   Background:   SDOH Interventions Today    Flowsheet Row Most Recent Value  SDOH Interventions   Food Insecurity Interventions Intervention Not Indicated  [patient is not needing food resources at this time.]  Housing Interventions Intervention Not Indicated  Transportation Interventions Intervention Not Indicated  [patient does not have transportation issues at this time. Pt has her own car.]  Utilities Interventions Intervention Not Indicated  Financial Strain Interventions Intervention Not Indicated     Assessment:   Goals Addressed             This Visit's Progress    BSW Goal       Current SDOH Barriers:  Digital Literacy Housing  Interventions: Patient interviewed and appropriate screenings performed Referred patient to community resources  BSW encouraged pt to visit [employer] for liberty mutual classes for seniors.  BSW will send pt a list of senior living options. Patient just renewed 9-month lease with apartment complex, but wishes to move in the future.           Recommendation:   attend all scheduled provider appointments ask for help if you don't understand your health insurance benefits  Follow Up Plan:   Patient declines further calls or assistance. [employer] will be closed. Patient has been provided contact information should new needs arise.   Laymon Doll, BSW [place]/[facility]BCI - [employer] Social Worker [phone]

## 2024-01-09 NOTE — Patient Instructions (Signed)
 Visit Information  Thank you for taking time to visit with me today. Please don't hesitate to contact me if I can be of assistance to you before our next scheduled appointment.  Our next appointment is no further scheduled appointments.   Please call the care guide team at [phone] if you need to cancel or reschedule your appointment.   Following is a copy of your care plan:   Goals Addressed             This Visit's Progress    COMPLETED: BSW Goal       Current SDOH Barriers:  Digital Literacy Housing  Interventions: Patient interviewed and appropriate screenings performed Referred patient to community resources  BSW encouraged pt to visit [employer] for liberty mutual classes for seniors.  BSW will send pt a list of senior living options. Patient just renewed 54-month lease with apartment complex, but wishes to move in the future.           Please call the Suicide and Crisis Lifeline: 988 go to [facility] Urgent [facility] [address], [place] [phone]) call 911 if you are experiencing a Mental Health or Behavioral Health Crisis or need someone to talk to.  Patient verbalized understanding of Care plan and visit instructions communicated this visit  Laymon Doll, [place]/VBCI - [facility] Social Worker [phone]

## 2024-01-15 ENCOUNTER — Other Ambulatory Visit: Payer: Self-pay | Admitting: Family Medicine

## 2024-01-15 DIAGNOSIS — I10 Essential (primary) hypertension: Secondary | ICD-10-CM

## 2024-01-25 ENCOUNTER — Other Ambulatory Visit: Payer: Self-pay | Admitting: *Deleted

## 2024-01-25 ENCOUNTER — Other Ambulatory Visit: Payer: Self-pay

## 2024-01-25 NOTE — Patient Outreach (Signed)
 Complex Care Management   Visit Note  01/25/2024  Name:  Alexis Cole MRN: [id number] DOB: 06/28/1945  Situation: Referral received for Complex Care Management related to HTN, Digital Literacy, and Housing I obtained verbal consent from Patient.  Visit completed with Patient  on the phone  Background:   Past Medical History:  Diagnosis Date   Acne    but not being treated for   Acute recurrent sinusitis 08/13/2014   Anemia    history of--many yrs ago   Anxiety    but doesn't take any meds for this   Aortic atherosclerosis 06/08/2016   noted on CT abd/pelvis   Arthritis    Knees, hips back, shoulder   Arthropathy, lower leg 02/10/2009   For eye care pt is going to Dr. Octavia, and her Ortho surgeon is Dr. Norleen Gavel and [facility].     Back pain 11/17/2015   Breast mass in female 08/20/2014   IMPRESSION: Further evaluation is suggested for possible mass in the left breast.The patient will be contacted regarding the findings, and additional imaging will be scheduled.  BI-RADS CATEGORY  0: Incomplete. Need additional imaging evaluation and/or prior mammograms for comparison.    Chronic back pain    Complication of anesthesia    States she woke up just after parathyroid  surgery while still in OR and attempted to get off the bed, remebers seeing staffs back towards her   Depression    Diarrhea    Diverticulitis 06/08/2016   noted on CT abd/pelvis   Diverticulosis 06/08/2016   noted on CT abd/pelvis   Dyslipidemia    Dyspnea 10/26/2017   Estrogen deficiency 08/13/2014   Fatigue 02/24/2016   Fatty liver 06/08/2016   severe noted on CT abd/pelvis    Gastric ulcer    history of--many yrs ago   General weakness 06/08/2016   Headache(784.0)    History of migraine   Heart murmur    History of bronchitis    last time 6months   History of syncope    Hx of migraines    last one a month ago   Hypercalcemia 12/12/2016   Hyperparathyroidism 12/12/2016   Hypertension     Dr      Jarrett   Increased endometrial stripe thickness 06/13/2016   Insomnia    Nyquil nightly   Joint pain    Joint swelling    Lactose intolerance    Nausea and vomiting    Obesity    Pneumonia 03/15/2016   left lower lobe, left lower lobe atelectasis, elevated left hemidiaphragm noted again, small left pleural effusion   Pre-syncope 04/12/2016   PTSD (post-traumatic stress disorder)    Saddle anesthesia 06/28/2018   Sinusitis    Urge incontinence of urine     Assessment: Patient Reported Symptoms:  Cognitive Cognitive Status: Able to follow simple commands, Alert and oriented to person, place, and time, Insightful and able to interpret abstract concepts, Normal speech and language skills Cognitive/Intellectual Conditions Management [RPT]: None reported or documented in medical history or problem list   Health Maintenance Behaviors: Annual physical exam, Sleep adequate, Stress management Healing Pattern: Average Health Facilitated by: Healthy diet, Stress management, Rest, Prayer/meditation  Neurological Neurological Review of Symptoms: Dizziness Neurological Management Strategies: Adequate rest, Coping strategies, Routine screening Neurological Comment: occassional dizzness  HEENT HEENT Symptoms Reported: Sore throat, Other: HEENT Management Strategies: Routine screening, Coping strategies, Adequate rest HEENT Comment: Patient reports that they are keeping an eye on her thyroid .  She is  als having some sinus drainage.    Cardiovascular Cardiovascular Symptoms Reported: No symptoms reported Cardiovascular Management Strategies: Adequate rest, Coping strategies, Routine screening Cardiovascular Self-Management Outcome: 4 (good)  Respiratory Other Respiratory Symptoms: Patient reports dry cough and some chest congestion.  Patient reports that she is taking NyQuil and a decongestant. Respiratory Management Strategies: Coping strategies, Adequate rest Respiratory Self-Management  Outcome: 3 (uncertain)  Endocrine Endocrine Symptoms Reported: No symptoms reported Is patient diabetic?: No Endocrine Self-Management Outcome: 4 (good)  Gastrointestinal Gastrointestinal Symptoms Reported: Diarrhea Gastrointestinal Management Strategies: Adequate rest, Diet modification Gastrointestinal Self-Management Outcome: 3 (uncertain) Gastrointestinal Comment: Patient reports that she drinks coffee which causes her to have diarrhea at times.    Genitourinary Genitourinary Symptoms Reported: No symptoms reported Genitourinary Management Strategies: Adequate rest Genitourinary Self-Management Outcome: 4 (good)  Integumentary Integumentary Symptoms Reported: Other Other Integumentary Symptoms: Patient reports that she has Ezcema and uses over the counter products to help keep her skin moisturized. Skin Management Strategies: Routine screening, Coping strategies, Adequate rest Skin Self-Management Outcome: 3 (uncertain)  Musculoskeletal Musculoskelatal Symptoms Reviewed: Back pain Additional Musculoskeletal Details: Patient reports that she has sciatica pain in her left hip area.  She reports that she does aerobotics 6 times a week and also does yoga execises for the lower back to help with back pain. Musculoskeletal Management Strategies: Adequate rest, Routine screening, Medication therapy Musculoskeletal Self-Management Outcome: 3 (uncertain) Falls in the past year?: No Number of falls in past year: 1 or less Was there an injury with Fall?: No Fall Risk Category Calculator: 0 Patient Fall Risk Level: Low Fall Risk Patient at Risk for Falls Due to: No Fall Risks  Psychosocial Psychosocial Symptoms Reported: Anxiety - if selected complete GAD Additional Psychological Details: Reports that she keeps moving foward and find ways to uses coping strategies.  She reports that her anxiety issues have to dod with her living arrangements and needing a more affordable place to live.  She has  denied assist for LCSW at this time. Behavioral Management Strategies: Activity, Adequate rest, Coping strategies, Community resources, Personnel Officer Health Self-Management Outcome: 4 (good) Major Change/Loss/Stressor/Fears (CP):  (housing issues/ BSW following) Quality of Family Relationships: unable to assess Do you feel physically threatened by others?: No    01/25/2024    PHQ2-9 Depression Screening   Little interest or pleasure in doing things Not at all  Feeling down, depressed, or hopeless Not at all  PHQ-2 - Total Score 0  Trouble falling or staying asleep, or sleeping too much    Feeling tired or having little energy    Poor appetite or overeating     Feeling bad about yourself - or that you are a failure or have let yourself or your family down    Trouble concentrating on things, such as reading the newspaper or watching television    Moving or speaking so slowly that other people could have noticed.  Or the opposite - being so fidgety or restless that you have been moving around a lot more than usual    Thoughts that you would be better off dead, or hurting yourself in some way    PHQ2-9 Total Score    If you checked off any problems, how difficult have these problems made it for you to do your work, take care of things at home, or get along with other people    Depression Interventions/Treatment      There were no vitals filed for this visit. Pain Scale: 0-10 Pain Score:  4  Pain Location: Other (Comment) (Sciatica pain in leg hip area.) Pain Orientation: Left Pain Descriptors / Indicators: Aching, Throbbing Pain Intervention(s): Medication (See eMAR), Rest, Relaxation, Other (Comment), Hot/Cold interventions (Patient reports that ice and rest helps)  Medications Reviewed Today     Reviewed by Jorja Nichole LABOR, RN (Case Manager) on 01/25/24 at 1521  Med List Status: <None>   Medication Order Taking? Sig Documenting Provider Last Dose Status Informant   amLODipine  (NORVASC ) 10 MG tablet [id number] Yes TAKE 1 TABLET BY MOUTH EVERYDAY AT BEDTIME Diona Perkins, MD  Active   aspirin  EC 325 MG tablet [id number]  Take 1 tablet (325 mg total) by mouth 2 (two) times daily after a meal. Take x 1 month post op to decrease risk of blood clots.  Patient not taking: Reported on 01/25/2024   Rondall Agent, PA-C  Active   atorvastatin  (LIPITOR) 40 MG tablet [id number] Yes Take 1 tablet (40 mg total) by mouth daily. Diona Perkins, MD  Active   BIOTIN PO [id number] Yes Take 500 mcg by mouth at bedtime.   Patient taking differently: Take 500 mcg by mouth at bedtime.    [provider]  Active Self  busPIRone  (BUSPAR ) 10 MG tablet [id number] Yes Take 1 tablet (10 mg total) by mouth 2 (two) times daily. Baloch, Mahnoor, MD  Active   Cholecalciferol (VITAMIN D3) 50 MCG (2000 UT) capsule [id number] Yes Take 2,000 Units by mouth at bedtime.  [provider]  Active Self  Coenzyme Q10 (CO Q-10) 300 MG CAPS [id number]  Take 300 mg by mouth at bedtime.  Patient not taking: Reported on 01/25/2024   [provider]  Active Self  escitalopram  (LEXAPRO ) 5 MG tablet [id number]  Take 1 tablet (5 mg total) by mouth daily. Take one tablet daily for one week, then take two tablets daily  Patient not taking: Reported on 01/25/2024   Lonnie, Mahnoor, MD  Active   fluticasone  (FLONASE ) 50 MCG/ACT nasal spray [id number]  PLACE 1 SPRAY INTO BOTH NOSTRILS DAILY AS NEEDED FOR ALLERGIES OR RHINITIS.  Patient not taking: Reported on 01/25/2024   Paige, Victoria J, DO  Active   folic acid (FOLVITE) 400 MCG tablet [id number] Yes Take 400 mcg by mouth at bedtime.  [provider]  Active Self  gabapentin  (NEURONTIN ) 300 MG capsule [id number] Yes TAKE 1 CAPSULE BY MOUTH THREE TIMES A DAY Baloch, Mahnoor, MD  Active   Krill Oil 350 MG CAPS [id number] Yes Take 350 mg by mouth at bedtime.  [provider]  Active Self  lisinopril  (ZESTRIL ) 20 MG tablet [id number] Yes  TAKE 1 TABLET BY MOUTH EVERY DAY Baloch, Mahnoor, MD  Active   Multiple Vitamins-Minerals (HEALTHY EYES PO) [id number] Yes Take 1 tablet by mouth at bedtime. [provider]  Active Self            Recommendation:   PCP Follow-up Specialty provider follow-up : Endocrinologist-03/03/24 Continue Current Plan of Care  Follow Up Plan:   Telephone follow-up in 1 month: 03/12/24 @ 3 pm.  Shira Bobst, RN, BSN, ACM RN Care Manager [employer] [phone]

## 2024-01-25 NOTE — Patient Instructions (Signed)
 Visit Information  Thank you for taking time to visit with me today. Please don't hesitate to contact me if I can be of assistance to you before our next scheduled appointment.  Your next care management appointment is by telephone on 03/12/24 at 3 pm.  Please call the care guide team at [phone] if you need to cancel, schedule, or reschedule an appointment.   Please call the Suicide and Crisis Lifeline: 988 call the [place]  National Suicide Prevention Lifeline: (217)472-2978 or TTY: (901)811-4486 TTY (318)194-1652) to talk to a trained counselor call 1-800-273-TALK (toll free, 24 hour hotline) go to [facility] [address] [place] [phone]) call 911 if you are experiencing a Mental Health or Behavioral Health Crisis or need someone to talk to.  Jannatul Wojdyla, RN, BSN, Theatre Manager [employer] [phone]

## 2024-01-29 ENCOUNTER — Encounter: Payer: Self-pay | Admitting: Family Medicine

## 2024-01-29 ENCOUNTER — Ambulatory Visit: Admitting: Family Medicine

## 2024-01-29 VITALS — BP 126/66 | HR 61 | Ht 64.0 in | Wt 143.4 lb

## 2024-01-29 DIAGNOSIS — G4700 Insomnia, unspecified: Secondary | ICD-10-CM

## 2024-01-29 MED ORDER — DOXEPIN HCL 3 MG PO TABS: 3.0000 mg | ORAL_TABLET | Freq: Every day | ORAL | 0 refills | Status: DC

## 2024-01-29 NOTE — Patient Instructions (Addendum)
 It was wonderful to see you today.  Please bring ALL of your medications with you to every visit.   Today we talked about:  Your mood, I'm glad you are feeling better. Lets try this medication 30 minutes before bed. We have room to go up if you feel it is helping but you need a bit more   Thank you for choosing [facility].   Please call [phone] with any questions about today's appointment.  Please arrive at least 15 minutes prior to your scheduled appointments.   If you had blood work today, I will send you a MyChart message or a letter if results are normal. Otherwise, I will give you a call.   If you had a referral placed, they will call you to set up an appointment. Please give us  a call if you don't hear back in the next 2 weeks.   If you need additional refills before your next appointment, please call your pharmacy first.   Do you need your medications delivered to your home?   We'll send your prescription to the [facility] for delivery.          Address: [address] [place], [place], [place] [address]          Phone: [phone]  Please call the [facility] to speak with a pharmacist and set up your home medication delivery. If you have any questions, feel free to contact us  -- we're happy to help!  Other [facility] that offer affordable prices on both prescriptions and over-the-counter items, as well as convenient services like vaccinations, are  [facility], at [facility]         Address:  [address], [place], [place] [address]         Phone: [phone]  [facility], located in the [facility]        Address: [address], [place], [place] [address]        Phone: [phone]  [facility], at [facility]       Address: [address], [place], [place] [address]       Phone: [phone]  [facility], at [facility]       Address: [address], [place], [place] [address]       Phone: [phone]  You should follow up in our clinic in one month  Gloriann Ogren, MD Family Medicine

## 2024-01-29 NOTE — Assessment & Plan Note (Signed)
 Conservative measures have failed. Feel it is reasonable to start medication to aid her efforts. Will start with low dose doxepin  and adjust if needed. - doxepin  3mg  nightly - 1 month follow up

## 2024-01-29 NOTE — Progress Notes (Signed)
    SUBJECTIVE:   CHIEF COMPLAINT / HPI:   Here for follow up on mood. At last visit felt like her anxiety was getting worse. Prescribed lexapro  which she never started. Has been doing deep meditation and feels this helps. She is still having trouble with sleep. Has difficulty falling asleep and staying asleep. Sometimes is up for long hours, like she has been awake for 16 hours today. Has tried sleep hygiene. Tried melatonin without success.    OBJECTIVE:   BP 126/66   Pulse 61   Ht 5' 4 (1.626 m)   Wt 143 lb 6.4 oz (65 kg)   SpO2 100%   BMI 24.61 kg/m   General: A&O, NAD HEENT: No sign of trauma, EOM grossly intact Respiratory: normal WOB GI: non-distended  Extremities: no peripheral edema. Neuro: Normal gait, moves all four extremities appropriately Skin: no lesions/rashes visualized Psych: Appropriate mood and affect   ASSESSMENT/PLAN:   Assessment & Plan Insomnia, unspecified type Conservative measures have failed. Feel it is reasonable to start medication to aid her efforts. Will start with low dose doxepin  and adjust if needed. - doxepin  3mg  nightly - 1 month follow up        Gloriann Ogren, MD [facility]

## 2024-01-31 ENCOUNTER — Telehealth: Payer: Self-pay

## 2024-01-31 DIAGNOSIS — G4700 Insomnia, unspecified: Secondary | ICD-10-CM

## 2024-01-31 NOTE — Telephone Encounter (Signed)
 Patient calls nurse line in regards to Doxepin .   She reports this prescription is not covered by her insurance.   She is requesting an alternative.   Will forward to pharmacy team for assistance.

## 2024-02-01 ENCOUNTER — Other Ambulatory Visit (HOSPITAL_COMMUNITY): Payer: Self-pay

## 2024-02-03 MED ORDER — RAMELTEON 8 MG PO TABS: 8.0000 mg | ORAL_TABLET | Freq: Every day | ORAL | 0 refills | Status: DC

## 2024-02-03 NOTE — Telephone Encounter (Signed)
 Doxepin  not covered by insurance. Will trial Ramelteon 

## 2024-02-04 NOTE — Telephone Encounter (Signed)
 Attempted to call patient, however no answer.   Will await her return call to discuss medication.

## 2024-02-15 ENCOUNTER — Other Ambulatory Visit: Payer: Self-pay | Admitting: Family Medicine

## 2024-02-15 DIAGNOSIS — F341 Dysthymic disorder: Secondary | ICD-10-CM

## 2024-02-26 DIAGNOSIS — G4700 Insomnia, unspecified: Secondary | ICD-10-CM

## 2024-03-03 ENCOUNTER — Ambulatory Visit: Admitting: "Endocrinology

## 2024-03-03 ENCOUNTER — Encounter: Payer: Self-pay | Admitting: "Endocrinology

## 2024-03-03 VITALS — BP 100/80 | HR 87 | Ht 64.0 in | Wt 144.0 lb

## 2024-03-03 DIAGNOSIS — E042 Nontoxic multinodular goiter: Secondary | ICD-10-CM | POA: Diagnosis not present

## 2024-03-03 NOTE — Progress Notes (Signed)
 "    Outpatient Endocrinology Note Alexis Birmingham, MD  03/03/2024   Alexis Cole 1945-08-25 [id number]  Referring Provider: Lonnie Earnest, MD Primary Care Provider: Lonnie Earnest, MD Subjective  No chief complaint on file.   Assessment & Plan  Diagnoses and all orders for this visit:  Multinodular goiter -     TSH + free T4    Alexis Cole is currently taking not taking any thyroid  medication. Patient was biochemically euthyroid on last check.  Educated on thyroid  axis.  Never been on any medication, no indication for it   Has history of multinodular goiter  S/p FNA in 11/13/2019: THYROID ,RIGHT INFERIOR: Scant follicular epithelium present (Bethesda category I) re-biopsied on 12/09/2019 Right inferior 2.7cm; Other 2 dimensions: 2.3 x 1.7cm:  Consistent with benign follicular nodule (Bethesda category II)  12/2022 repeat thyroid  U/S reported thyromegaly with multiple nodules  04/2023 Ordered FNA for Right; Mid 1.8 cm x 1.1 x 0.9 cm, previously, 1.8 x 1.6 x 1.0 cm 04/2023 Ordered FNA for 2021 FNA -ve THYROID ,RIGHT INFERIOR given >50% increase in volume (2025 at 3.7 cm x 2.6 cm x 4.4cm ), previously at  2.7cm x 2.3 x 1.7cm 05/23/23: Similar appearance of previously biopsied right inferior solid thyroid  nodule. Labeled 4, 4.4 x 3.7 x 2.6 cm: Benign follicular nodule (Bethesda category II)  05/23/23: Nodule #3: TR4 Right; Mid, Maximum size: 1.8 cm x 1.1 x 0.9 cm, previously, 1.8 x 1.6 x 1.0 cm: - Scant follicular epithelium present (Bethesda category I) -ordered repeat on 09/03/23: wasn't done due to calcification and size involution 09/14/23: US  thyroid : The heavily calcified nodule versus dystrophic macrocalcification in the right mid gland which was previously biopsied in April is slightly decreased in size on today's examination. Involution over time is consistent with benignity. After discussion with the patient, biopsy was deferred. Previously biopsied nodule in the right lower  gland remains stable. F/u US  in 6-8 mo; will be the 5th U/S, starting in 2021 Patient understands and agreed   I have reviewed current medications, nurse's notes, allergies, vital signs, past medical and surgical history, family medical history, and social history for this encounter. Counseled patient on symptoms, examination findings, lab findings, imaging results, treatment decisions and monitoring and prognosis. The patient understood the recommendations and agrees with the treatment plan. All questions regarding treatment plan were fully answered.   Return in about 6 months (around 08/31/2024) for visit, labs before next visit.   Alexis Birmingham, MD  03/03/2024   I have reviewed current medications, nurse's notes, allergies, vital signs, past medical and surgical history, family medical history, and social history for this encounter. Counseled patient on symptoms, examination findings, lab findings, imaging results, treatment decisions and monitoring and prognosis. The patient understood the recommendations and agrees with the treatment plan. All questions regarding treatment plan were fully answered.   History of Present Illness Alexis Cole is a 79 y.o. year old female who presents to our clinic with multinodular goiter diagnosed in 10/2019.    Never been on any thyroid  medication  No active complaints other than being tired   Compressive symptoms:  dysphagia  No dysphonia  No positional dyspnea (especially with simultaneous arms elevation)  No  Smokes  Yes On biotin  Yes Personal history of head/neck surgery/irradiation  No   Physical Exam  BP 100/80   Pulse 87   Ht 5' 4 (1.626 m)   Wt 144 lb (65.3 kg)   SpO2 95%   BMI 24.72  kg/m  Constitutional: well developed, well nourished Head: normocephalic, atraumatic, no exophthalmos Eyes: sclera anicteric, no redness Neck: + thyromegaly, - thyroid  tenderness; + nodule palpated Lungs: normal respiratory effort Neurology:  alert and oriented, no fine hand tremor Skin: dry, no appreciable rashes Musculoskeletal: no appreciable defects Psychiatric: normal mood and affect  Allergies Allergies  Allergen Reactions   Tape Rash    Current Medications Patient's Medications  New Prescriptions   No medications on file  Previous Medications   AMLODIPINE  (NORVASC ) 10 MG TABLET    TAKE 1 TABLET BY MOUTH EVERYDAY AT BEDTIME   ASPIRIN  EC 325 MG TABLET    Take 1 tablet (325 mg total) by mouth 2 (two) times daily after a meal. Take x 1 month post op to decrease risk of blood clots.   ATORVASTATIN  (LIPITOR) 40 MG TABLET    Take 1 tablet (40 mg total) by mouth daily.   BIOTIN PO    Take 500 mcg by mouth at bedtime.    BUSPIRONE  (BUSPAR ) 10 MG TABLET    TAKE 1 TABLET BY MOUTH TWICE A DAY   CHOLECALCIFEROL (VITAMIN D3) 50 MCG (2000 UT) CAPSULE    Take 2,000 Units by mouth at bedtime.    FOLIC ACID (FOLVITE) 400 MCG TABLET    Take 400 mcg by mouth at bedtime.    GABAPENTIN  (NEURONTIN ) 300 MG CAPSULE    TAKE 1 CAPSULE BY MOUTH THREE TIMES A DAY   KRILL OIL 350 MG CAPS    Take 350 mg by mouth at bedtime.    LISINOPRIL  (ZESTRIL ) 20 MG TABLET    TAKE 1 TABLET BY MOUTH EVERY DAY   MULTIPLE VITAMINS-MINERALS (HEALTHY EYES PO)    Take 1 tablet by mouth at bedtime.   RAMELTEON  (ROZEREM ) 8 MG TABLET    TAKE 1 TABLET BY MOUTH AT BEDTIME.  Modified Medications   No medications on file  Discontinued Medications   No medications on file    Past Medical History Past Medical History:  Diagnosis Date   Acne    but not being treated for   Acute recurrent sinusitis 08/13/2014   Anemia    history of--many yrs ago   Anxiety    but doesn't take any meds for this   Aortic atherosclerosis 06/08/2016   noted on CT abd/pelvis   Arthritis    Knees, hips back, shoulder   Arthropathy, lower leg 02/10/2009   For eye care pt is going to Dr. Octavia, and her Ortho surgeon is Dr. Norleen Gavel and [facility].     Back pain 11/17/2015    Breast mass in female 08/20/2014   IMPRESSION: Further evaluation is suggested for possible mass in the left breast.The patient will be contacted regarding the findings, and additional imaging will be scheduled.  BI-RADS CATEGORY  0: Incomplete. Need additional imaging evaluation and/or prior mammograms for comparison.    Chronic back pain    Complication of anesthesia    States she woke up just after parathyroid  surgery while still in OR and attempted to get off the bed, remebers seeing staffs back towards her   Depression    Diarrhea    Diverticulitis 06/08/2016   noted on CT abd/pelvis   Diverticulosis 06/08/2016   noted on CT abd/pelvis   Dyslipidemia    Dyspnea 10/26/2017   Estrogen deficiency 08/13/2014   Fatigue 02/24/2016   Fatty liver 06/08/2016   severe noted on CT abd/pelvis    Gastric ulcer    history of--many  yrs ago   General weakness 06/08/2016   Headache(784.0)    History of migraine   Heart murmur    History of bronchitis    last time 6months   History of syncope    Hx of migraines    last one a month ago   Hypercalcemia 12/12/2016   Hyperparathyroidism 12/12/2016   Hypertension     Dr     Jarrett   Increased endometrial stripe thickness 06/13/2016   Insomnia    Nyquil nightly   Joint pain    Joint swelling    Lactose intolerance    Nausea and vomiting    Obesity    Pneumonia 03/15/2016   left lower lobe, left lower lobe atelectasis, elevated left hemidiaphragm noted again, small left pleural effusion   Pre-syncope 04/12/2016   PTSD (post-traumatic stress disorder)    Saddle anesthesia 06/28/2018   Sinusitis    Urge incontinence of urine     Past Surgical History Past Surgical History:  Procedure Laterality Date   CATARACT EXTRACTION, BILATERAL     CERVICAL CONE BIOPSY     DILATION AND CURETTAGE OF UTERUS  30+yrs ago   MOUTH SURGERY     NASAL SEPTUM SURGERY  40+yrs ago   PARATHYROIDECTOMY Left 01/04/2017   Procedure: LEFT SUPERIOR  PARATHYROIDECTOMY;  Surgeon: Eletha Boas, MD;  Location: [facility];  Service: General;  Laterality: Left;   TOTAL HIP ARTHROPLASTY  09/01/2011   Procedure: TOTAL HIP ARTHROPLASTY;  Surgeon: Norleen LITTIE Gavel, MD;  Location: [facility];  Service: Orthopedics;  Laterality: Right;   TOTAL HIP ARTHROPLASTY Left 07/26/2018   Procedure: TOTAL HIP ARTHROPLASTY ANTERIOR APPROACH;  Surgeon: Gavel Norleen, MD;  Location: [facility];  Service: Orthopedics;  Laterality: Left;   TOTAL KNEE ARTHROPLASTY Left 03/09/2017   Procedure: LEFT TOTAL KNEE ARTHROPLASTY;  Surgeon: Gavel Norleen, MD;  Location: [facility];  Service: Orthopedics;  Laterality: Left;   TOTAL KNEE ARTHROPLASTY Right 07/18/2019   Procedure: TOTAL KNEE ARTHROPLASTY;  Surgeon: Gavel Norleen, MD;  Location: [facility];  Service: Orthopedics;  Laterality: Right;   TUBAL LIGATION  30+yrs ago    Family History family history includes Arthritis-Osteo in her mother; Cancer - Prostate in her father; Diabetes Mellitus II in her maternal grandmother; Heart attack in her maternal grandfather; High blood pressure in her brother and son; Hypertension in her mother; Irritable bowel syndrome in her mother.  Social History Social History   Socioeconomic History   Marital status: Divorced    Spouse name: Not on file   Number of children: 1   Years of education: 12   Highest education level: High school graduate  Occupational History   Occupation: producer, television/film/video     Comment: retired    Occupation: paediatric nurse     Comment: retired   Tobacco Use   Smoking status: Every Day    Current packs/day: 0.50    Average packs/day: 0.5 packs/day for 56.0 years (28.0 ttl pk-yrs)    Types: Cigarettes    Start date: 02/21/1968    Passive exposure: Current   Smokeless tobacco: Never   Tobacco comments:    quit in the past for 9 years.  Previous 1ppd  Vaping Use   Vaping status: Never Used  Substance and Sexual Activity   Alcohol use: No    Alcohol/week: 0.0 standard drinks of alcohol    Drug use: No   Sexual activity: Not Currently    Birth control/protection: Post-menopausal  Other Topics Concern   Not  on file  Social History Narrative   Patient lives alone in [place].    The patient does have a dog named Cardio who brings her great joy.   Patients only child, a son, lives out in [place]. They talk on the phone several times a week.    The patient is Buddhist and practices daily meditation.    The patient enjoys walking her dog, zumba classes and swimming.        Social Drivers of Health   Tobacco Use: High Risk (03/03/2024)   Patient History    Smoking Tobacco Use: Every Day    Smokeless Tobacco Use: Never    Passive Exposure: Current  Financial Resource Strain: Low Risk (01/09/2024)   Overall Financial Resource Strain (CARDIA)    Difficulty of Paying Living Expenses: Not very hard  Food Insecurity: No Food Insecurity (01/25/2024)   Epic    Worried About Programme Researcher, Broadcasting/film/video in the Last Year: Never true    Ran Out of Food in the Last Year: Never true  Transportation Needs: No Transportation Needs (01/25/2024)   Epic    Lack of Transportation (Medical): No    Lack of Transportation (Non-Medical): No  Physical Activity: Sufficiently Active (07/26/2023)   Exercise Vital Sign    Days of Exercise per Week: 6 days    Minutes of Exercise per Session: 60 min  Stress: No Stress Concern Present (07/26/2023)   [employer] of Occupational Health - Occupational Stress Questionnaire    Feeling of Stress : Not at all  Social Connections: Socially Isolated (07/26/2023)   Social Connection and Isolation Panel    Frequency of Communication with Friends and Family: More than three times a week    Frequency of Social Gatherings with Friends and Family: Three times a week    Attends Religious Services: Never    Active Member of Clubs or Organizations: No    Attends Banker Meetings: Never    Marital Status: Divorced  Catering Manager Violence: Not At Risk  (01/25/2024)   Epic    Fear of Current or Ex-Partner: No    Emotionally Abused: No    Physically Abused: No    Sexually Abused: No  Depression (PHQ2-9): Medium Risk (01/29/2024)   Depression (PHQ2-9)    PHQ-2 Score: 7  Alcohol Screen: Low Risk (07/26/2023)   Alcohol Screen    Last Alcohol Screening Score (AUDIT): 0  Housing: Low Risk (01/25/2024)   Epic    Unable to Pay for Housing in the Last Year: No    Number of Times Moved in the Last Year: 0    Homeless in the Last Year: No  Utilities: Not At Risk (01/25/2024)   Epic    Threatened with loss of utilities: No  Health Literacy: Adequate Health Literacy (07/26/2023)   B1300 Health Literacy    Frequency of need for help with medical instructions: Never    Laboratory Investigations Lab Results  Component Value Date   TSH 0.51 05/02/2023   TSH 0.572 12/25/2022   TSH 1.140 05/25/2021   FREET4 1.0 05/02/2023   FREET4 1.17 11/26/2019     No results found for: TSI   No components found for: TRAB   Lab Results  Component Value Date   CHOL 146 12/25/2022   Lab Results  Component Value Date   HDL 83 12/25/2022   Lab Results  Component Value Date   LDLCALC 53 12/25/2022   Lab Results  Component Value Date   TRIG  43 12/25/2022   Lab Results  Component Value Date   CHOLHDL 1.8 12/25/2022   Lab Results  Component Value Date   CREATININE 0.84 12/25/2022   No results found for: GFR    Component Value Date/Time   NA 137 12/25/2022 1152   K 4.5 12/25/2022 1152   CL 101 12/25/2022 1152   CO2 23 12/25/2022 1152   GLUCOSE 94 12/25/2022 1152   GLUCOSE 134 (H) 07/10/2019 1041   BUN 11 12/25/2022 1152   CREATININE 0.84 12/25/2022 1152   CREATININE 0.88 04/17/2016 1001   CALCIUM  10.9 (H) 03/06/2023 1517   PROT 6.7 12/25/2022 1152   ALBUMIN 4.8 12/25/2022 1152   AST 24 12/25/2022 1152   ALT 14 12/25/2022 1152   ALKPHOS 92 12/25/2022 1152   BILITOT 0.5 12/25/2022 1152   GFRNONAA 56 (L) 07/10/2019 1041   GFRNONAA 58  (L) 04/13/2016 1559   GFRAA >60 07/10/2019 1041   GFRAA 67 04/13/2016 1559      Latest Ref Rng & Units 03/06/2023    3:17 PM 12/25/2022   11:52 AM 05/24/2020    9:35 AM  BMP  Glucose 70 - 99 mg/dL  94  889   BUN 8 - 27 mg/dL  11  17   Creatinine 9.42 - 1.00 mg/dL  9.15  9.16   BUN/Creat Ratio 12 - 28  13  20    Sodium 134 - 144 mmol/L  137  139   Potassium 3.5 - 5.2 mmol/L  4.5  4.3   Chloride 96 - 106 mmol/L  101  102   CO2 20 - 29 mmol/L  23  20   Calcium  8.7 - 10.3 mg/dL 89.0  89.5  89.9        Component Value Date/Time   WBC 7.4 12/25/2022 1152   WBC 12.8 (H) 07/19/2019 0314   RBC 4.23 12/25/2022 1152   RBC 3.89 07/19/2019 0314   HGB 13.5 12/25/2022 1152   HCT 39.9 12/25/2022 1152   PLT 174 12/25/2022 1152   MCV 94 12/25/2022 1152   MCH 31.9 12/25/2022 1152   MCH 30.8 07/19/2019 0314   MCHC 33.8 12/25/2022 1152   MCHC 31.8 07/19/2019 0314   RDW 11.9 12/25/2022 1152   LYMPHSABS 3.1 07/10/2019 1041   LYMPHSABS 4.3 (H) 02/15/2018 1529   MONOABS 0.7 07/10/2019 1041   EOSABS 0.1 07/10/2019 1041   EOSABS 0.3 02/15/2018 1529   BASOSABS 0.1 07/10/2019 1041   BASOSABS 0.1 02/15/2018 1529      Parts of this note may have been dictated using voice recognition software. There may be variances in spelling and vocabulary which are unintentional. Not all errors are proofread. Please notify the dino if any discrepancies are noted or if the meaning of any statement is not clear.    "

## 2024-03-12 ENCOUNTER — Telehealth: Payer: Self-pay | Admitting: *Deleted

## 2024-04-16 ENCOUNTER — Telehealth: Payer: Self-pay | Admitting: *Deleted

## 2024-05-05 ENCOUNTER — Ambulatory Visit: Admitting: "Endocrinology

## 2024-07-31 ENCOUNTER — Encounter

## 2024-08-25 ENCOUNTER — Other Ambulatory Visit

## 2024-09-01 ENCOUNTER — Ambulatory Visit: Admitting: "Endocrinology
